# Patient Record
Sex: Male | Born: 1980
Health system: Southern US, Community
[De-identification: ages and names within clinical notes are randomized; demographics above are authoritative.]

## PROBLEM LIST (undated history)

## (undated) DIAGNOSIS — M1990 Unspecified osteoarthritis, unspecified site: Secondary | ICD-10-CM

## (undated) DIAGNOSIS — F32A Depression, unspecified: Secondary | ICD-10-CM

## (undated) DIAGNOSIS — J45909 Unspecified asthma, uncomplicated: Secondary | ICD-10-CM

## (undated) DIAGNOSIS — F329 Major depressive disorder, single episode, unspecified: Secondary | ICD-10-CM

## (undated) DIAGNOSIS — T8149XA Infection following a procedure, other surgical site, initial encounter: Secondary | ICD-10-CM

## (undated) DIAGNOSIS — F419 Anxiety disorder, unspecified: Secondary | ICD-10-CM

## (undated) DIAGNOSIS — I00 Rheumatic fever without heart involvement: Secondary | ICD-10-CM

## (undated) DIAGNOSIS — G894 Chronic pain syndrome: Secondary | ICD-10-CM

## (undated) HISTORY — DX: Unspecified asthma, uncomplicated: J45.909

## (undated) HISTORY — DX: Unspecified osteoarthritis, unspecified site: M19.90

## (undated) HISTORY — DX: Rheumatic fever without heart involvement: I00

## (undated) HISTORY — DX: Depression, unspecified: F32.A

## (undated) HISTORY — DX: Chronic pain syndrome: G89.4

## (undated) HISTORY — DX: Major depressive disorder, single episode, unspecified: F32.9

## (undated) HISTORY — DX: Infection following a procedure, other surgical site, initial encounter: T81.49XA

## (undated) HISTORY — DX: Anxiety disorder, unspecified: F41.9

---
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
## 2003-09-11 ENCOUNTER — Other Ambulatory Visit: Payer: Self-pay

## 2010-09-17 DIAGNOSIS — T8149XA Infection following a procedure, other surgical site, initial encounter: Secondary | ICD-10-CM

## 2010-09-17 HISTORY — DX: Infection following a procedure, other surgical site, initial encounter: T81.49XA

## 2011-09-29 ENCOUNTER — Emergency Department: Payer: Self-pay | Admitting: Emergency Medicine

## 2011-09-29 DIAGNOSIS — R6889 Other general symptoms and signs: Secondary | ICD-10-CM | POA: Diagnosis not present

## 2011-09-29 LAB — BASIC METABOLIC PANEL
BUN: 21 mg/dL — ABNORMAL HIGH (ref 7–18)
Calcium, Total: 8.9 mg/dL (ref 8.5–10.1)
Creatinine: 0.7 mg/dL (ref 0.60–1.30)
EGFR (African American): >60
EGFR (Non-African Amer.): >60
Glucose: 113 mg/dL — ABNORMAL HIGH (ref 65–99)
Potassium: 4 mmol/L (ref 3.5–5.1)
Sodium: 143 mmol/L (ref 136–145)

## 2011-09-29 LAB — CBC
MCH: 29.8 pg (ref 26.0–34.0)
MCV: 85 fL (ref 80–100)
Platelet: 186 10*3/uL (ref 150–440)
RBC: 5.09 10*6/uL (ref 4.40–5.90)
RDW: 13.8 % (ref 11.5–14.5)
WBC: 7.4 10*3/uL (ref 3.8–10.6)

## 2011-11-21 DIAGNOSIS — N390 Urinary tract infection, site not specified: Secondary | ICD-10-CM | POA: Diagnosis not present

## 2011-11-21 DIAGNOSIS — M25559 Pain in unspecified hip: Secondary | ICD-10-CM | POA: Diagnosis not present

## 2011-12-12 DIAGNOSIS — N390 Urinary tract infection, site not specified: Secondary | ICD-10-CM | POA: Diagnosis not present

## 2011-12-19 DIAGNOSIS — G8220 Paraplegia, unspecified: Secondary | ICD-10-CM | POA: Diagnosis not present

## 2011-12-19 DIAGNOSIS — R293 Abnormal posture: Secondary | ICD-10-CM | POA: Diagnosis not present

## 2011-12-19 DIAGNOSIS — Z4689 Encounter for fitting and adjustment of other specified devices: Secondary | ICD-10-CM | POA: Diagnosis not present

## 2011-12-30 DIAGNOSIS — Z20828 Contact with and (suspected) exposure to other viral communicable diseases: Secondary | ICD-10-CM | POA: Diagnosis not present

## 2012-01-02 DIAGNOSIS — G894 Chronic pain syndrome: Secondary | ICD-10-CM | POA: Diagnosis not present

## 2012-01-02 DIAGNOSIS — R369 Urethral discharge, unspecified: Secondary | ICD-10-CM | POA: Diagnosis not present

## 2012-01-02 DIAGNOSIS — R5383 Other fatigue: Secondary | ICD-10-CM | POA: Diagnosis not present

## 2012-01-02 DIAGNOSIS — G8220 Paraplegia, unspecified: Secondary | ICD-10-CM | POA: Diagnosis not present

## 2012-01-02 DIAGNOSIS — F4310 Post-traumatic stress disorder, unspecified: Secondary | ICD-10-CM | POA: Diagnosis not present

## 2012-01-02 DIAGNOSIS — F319 Bipolar disorder, unspecified: Secondary | ICD-10-CM | POA: Diagnosis not present

## 2012-01-02 DIAGNOSIS — R5381 Other malaise: Secondary | ICD-10-CM | POA: Diagnosis not present

## 2012-01-02 DIAGNOSIS — F988 Other specified behavioral and emotional disorders with onset usually occurring in childhood and adolescence: Secondary | ICD-10-CM | POA: Diagnosis not present

## 2012-01-30 DIAGNOSIS — N390 Urinary tract infection, site not specified: Secondary | ICD-10-CM | POA: Diagnosis not present

## 2012-02-13 DIAGNOSIS — R21 Rash and other nonspecific skin eruption: Secondary | ICD-10-CM | POA: Diagnosis not present

## 2012-02-13 DIAGNOSIS — N390 Urinary tract infection, site not specified: Secondary | ICD-10-CM | POA: Diagnosis not present

## 2012-02-13 DIAGNOSIS — N529 Male erectile dysfunction, unspecified: Secondary | ICD-10-CM | POA: Diagnosis not present

## 2012-02-13 DIAGNOSIS — G8220 Paraplegia, unspecified: Secondary | ICD-10-CM | POA: Diagnosis not present

## 2012-02-14 DIAGNOSIS — R21 Rash and other nonspecific skin eruption: Secondary | ICD-10-CM | POA: Diagnosis not present

## 2012-05-01 DIAGNOSIS — N390 Urinary tract infection, site not specified: Secondary | ICD-10-CM | POA: Diagnosis not present

## 2012-05-08 DIAGNOSIS — R339 Retention of urine, unspecified: Secondary | ICD-10-CM | POA: Diagnosis not present

## 2012-05-22 DIAGNOSIS — N390 Urinary tract infection, site not specified: Secondary | ICD-10-CM | POA: Diagnosis not present

## 2012-05-22 DIAGNOSIS — G8220 Paraplegia, unspecified: Secondary | ICD-10-CM | POA: Diagnosis not present

## 2012-05-22 DIAGNOSIS — G894 Chronic pain syndrome: Secondary | ICD-10-CM | POA: Diagnosis not present

## 2012-06-10 DIAGNOSIS — N390 Urinary tract infection, site not specified: Secondary | ICD-10-CM | POA: Diagnosis not present

## 2012-07-16 DIAGNOSIS — G4700 Insomnia, unspecified: Secondary | ICD-10-CM | POA: Diagnosis not present

## 2012-07-16 DIAGNOSIS — R6882 Decreased libido: Secondary | ICD-10-CM | POA: Diagnosis not present

## 2012-07-16 DIAGNOSIS — R5383 Other fatigue: Secondary | ICD-10-CM | POA: Diagnosis not present

## 2012-07-16 DIAGNOSIS — R5381 Other malaise: Secondary | ICD-10-CM | POA: Diagnosis not present

## 2012-07-17 DIAGNOSIS — Z79899 Other long term (current) drug therapy: Secondary | ICD-10-CM | POA: Diagnosis not present

## 2012-07-17 DIAGNOSIS — N390 Urinary tract infection, site not specified: Secondary | ICD-10-CM | POA: Diagnosis not present

## 2012-07-18 DIAGNOSIS — R5381 Other malaise: Secondary | ICD-10-CM | POA: Diagnosis not present

## 2012-07-18 DIAGNOSIS — R5383 Other fatigue: Secondary | ICD-10-CM | POA: Diagnosis not present

## 2012-08-28 DIAGNOSIS — G894 Chronic pain syndrome: Secondary | ICD-10-CM | POA: Diagnosis not present

## 2012-08-28 DIAGNOSIS — N390 Urinary tract infection, site not specified: Secondary | ICD-10-CM | POA: Diagnosis not present

## 2012-08-28 DIAGNOSIS — G8220 Paraplegia, unspecified: Secondary | ICD-10-CM | POA: Diagnosis not present

## 2012-08-28 DIAGNOSIS — G4700 Insomnia, unspecified: Secondary | ICD-10-CM | POA: Diagnosis not present

## 2012-09-24 DIAGNOSIS — N390 Urinary tract infection, site not specified: Secondary | ICD-10-CM | POA: Diagnosis not present

## 2012-12-04 DIAGNOSIS — F4310 Post-traumatic stress disorder, unspecified: Secondary | ICD-10-CM | POA: Diagnosis not present

## 2012-12-04 DIAGNOSIS — G894 Chronic pain syndrome: Secondary | ICD-10-CM | POA: Diagnosis not present

## 2012-12-04 DIAGNOSIS — Z79899 Other long term (current) drug therapy: Secondary | ICD-10-CM | POA: Diagnosis not present

## 2012-12-04 DIAGNOSIS — G8220 Paraplegia, unspecified: Secondary | ICD-10-CM | POA: Diagnosis not present

## 2012-12-04 DIAGNOSIS — N390 Urinary tract infection, site not specified: Secondary | ICD-10-CM | POA: Diagnosis not present

## 2012-12-24 DIAGNOSIS — N390 Urinary tract infection, site not specified: Secondary | ICD-10-CM | POA: Diagnosis not present

## 2013-01-20 DIAGNOSIS — G8220 Paraplegia, unspecified: Secondary | ICD-10-CM | POA: Diagnosis not present

## 2013-01-20 DIAGNOSIS — N452 Orchitis: Secondary | ICD-10-CM | POA: Diagnosis not present

## 2013-01-20 DIAGNOSIS — G894 Chronic pain syndrome: Secondary | ICD-10-CM | POA: Diagnosis not present

## 2013-01-20 DIAGNOSIS — N390 Urinary tract infection, site not specified: Secondary | ICD-10-CM | POA: Diagnosis not present

## 2013-01-20 DIAGNOSIS — F4310 Post-traumatic stress disorder, unspecified: Secondary | ICD-10-CM | POA: Diagnosis not present

## 2013-04-22 DIAGNOSIS — G894 Chronic pain syndrome: Secondary | ICD-10-CM | POA: Diagnosis not present

## 2013-04-22 DIAGNOSIS — R3919 Other difficulties with micturition: Secondary | ICD-10-CM | POA: Diagnosis not present

## 2013-04-22 DIAGNOSIS — Z13220 Encounter for screening for lipoid disorders: Secondary | ICD-10-CM | POA: Diagnosis not present

## 2013-04-22 DIAGNOSIS — N452 Orchitis: Secondary | ICD-10-CM | POA: Diagnosis not present

## 2013-04-22 DIAGNOSIS — N390 Urinary tract infection, site not specified: Secondary | ICD-10-CM | POA: Diagnosis not present

## 2013-04-22 DIAGNOSIS — G8220 Paraplegia, unspecified: Secondary | ICD-10-CM | POA: Diagnosis not present

## 2013-04-22 DIAGNOSIS — F988 Other specified behavioral and emotional disorders with onset usually occurring in childhood and adolescence: Secondary | ICD-10-CM | POA: Diagnosis not present

## 2013-04-22 DIAGNOSIS — F4310 Post-traumatic stress disorder, unspecified: Secondary | ICD-10-CM | POA: Diagnosis not present

## 2013-04-22 DIAGNOSIS — Z993 Dependence on wheelchair: Secondary | ICD-10-CM | POA: Diagnosis not present

## 2013-04-22 DIAGNOSIS — Z79899 Other long term (current) drug therapy: Secondary | ICD-10-CM | POA: Diagnosis not present

## 2013-05-21 DIAGNOSIS — N319 Neuromuscular dysfunction of bladder, unspecified: Secondary | ICD-10-CM | POA: Diagnosis not present

## 2013-05-21 DIAGNOSIS — N390 Urinary tract infection, site not specified: Secondary | ICD-10-CM | POA: Diagnosis not present

## 2013-06-11 DIAGNOSIS — N390 Urinary tract infection, site not specified: Secondary | ICD-10-CM | POA: Diagnosis not present

## 2013-06-11 DIAGNOSIS — J069 Acute upper respiratory infection, unspecified: Secondary | ICD-10-CM | POA: Diagnosis not present

## 2013-06-11 DIAGNOSIS — N318 Other neuromuscular dysfunction of bladder: Secondary | ICD-10-CM | POA: Diagnosis not present

## 2013-06-17 DIAGNOSIS — Z23 Encounter for immunization: Secondary | ICD-10-CM | POA: Diagnosis not present

## 2013-06-17 DIAGNOSIS — L89309 Pressure ulcer of unspecified buttock, unspecified stage: Secondary | ICD-10-CM | POA: Diagnosis not present

## 2013-06-17 DIAGNOSIS — R110 Nausea: Secondary | ICD-10-CM | POA: Diagnosis not present

## 2013-06-17 DIAGNOSIS — Z993 Dependence on wheelchair: Secondary | ICD-10-CM | POA: Diagnosis not present

## 2013-06-17 DIAGNOSIS — G894 Chronic pain syndrome: Secondary | ICD-10-CM | POA: Diagnosis not present

## 2013-06-17 DIAGNOSIS — R0602 Shortness of breath: Secondary | ICD-10-CM | POA: Diagnosis not present

## 2013-06-17 DIAGNOSIS — F988 Other specified behavioral and emotional disorders with onset usually occurring in childhood and adolescence: Secondary | ICD-10-CM | POA: Diagnosis not present

## 2013-06-17 DIAGNOSIS — G8220 Paraplegia, unspecified: Secondary | ICD-10-CM | POA: Diagnosis not present

## 2013-06-17 DIAGNOSIS — F4310 Post-traumatic stress disorder, unspecified: Secondary | ICD-10-CM | POA: Diagnosis not present

## 2013-06-23 DIAGNOSIS — Z79899 Other long term (current) drug therapy: Secondary | ICD-10-CM | POA: Diagnosis not present

## 2013-06-23 DIAGNOSIS — N508 Other specified disorders of male genital organs: Secondary | ICD-10-CM | POA: Diagnosis not present

## 2013-06-23 DIAGNOSIS — R609 Edema, unspecified: Secondary | ICD-10-CM | POA: Diagnosis not present

## 2013-06-23 DIAGNOSIS — N509 Disorder of male genital organs, unspecified: Secondary | ICD-10-CM | POA: Diagnosis not present

## 2013-06-23 DIAGNOSIS — G8220 Paraplegia, unspecified: Secondary | ICD-10-CM | POA: Diagnosis not present

## 2013-06-23 DIAGNOSIS — N318 Other neuromuscular dysfunction of bladder: Secondary | ICD-10-CM | POA: Diagnosis not present

## 2013-11-09 DIAGNOSIS — L97509 Non-pressure chronic ulcer of other part of unspecified foot with unspecified severity: Secondary | ICD-10-CM | POA: Diagnosis not present

## 2013-11-09 DIAGNOSIS — F4310 Post-traumatic stress disorder, unspecified: Secondary | ICD-10-CM | POA: Diagnosis not present

## 2013-11-09 DIAGNOSIS — R300 Dysuria: Secondary | ICD-10-CM | POA: Diagnosis not present

## 2013-11-09 DIAGNOSIS — L89309 Pressure ulcer of unspecified buttock, unspecified stage: Secondary | ICD-10-CM | POA: Diagnosis not present

## 2013-11-09 DIAGNOSIS — G894 Chronic pain syndrome: Secondary | ICD-10-CM | POA: Diagnosis not present

## 2013-11-09 DIAGNOSIS — F319 Bipolar disorder, unspecified: Secondary | ICD-10-CM | POA: Diagnosis not present

## 2013-11-09 DIAGNOSIS — F411 Generalized anxiety disorder: Secondary | ICD-10-CM | POA: Diagnosis not present

## 2013-11-09 DIAGNOSIS — G4700 Insomnia, unspecified: Secondary | ICD-10-CM | POA: Diagnosis not present

## 2013-11-29 DIAGNOSIS — N390 Urinary tract infection, site not specified: Secondary | ICD-10-CM | POA: Diagnosis not present

## 2013-11-29 DIAGNOSIS — Z888 Allergy status to other drugs, medicaments and biological substances status: Secondary | ICD-10-CM | POA: Diagnosis not present

## 2013-11-29 DIAGNOSIS — Z885 Allergy status to narcotic agent status: Secondary | ICD-10-CM | POA: Diagnosis not present

## 2013-11-29 DIAGNOSIS — T191XXA Foreign body in bladder, initial encounter: Secondary | ICD-10-CM | POA: Diagnosis not present

## 2013-11-29 DIAGNOSIS — Z79899 Other long term (current) drug therapy: Secondary | ICD-10-CM | POA: Diagnosis not present

## 2013-11-29 DIAGNOSIS — N329 Bladder disorder, unspecified: Secondary | ICD-10-CM | POA: Diagnosis not present

## 2013-11-29 DIAGNOSIS — Z87891 Personal history of nicotine dependence: Secondary | ICD-10-CM | POA: Diagnosis not present

## 2013-11-29 DIAGNOSIS — Z883 Allergy status to other anti-infective agents status: Secondary | ICD-10-CM | POA: Diagnosis not present

## 2013-11-29 DIAGNOSIS — T190XXA Foreign body in urethra, initial encounter: Secondary | ICD-10-CM | POA: Diagnosis not present

## 2013-11-29 DIAGNOSIS — G839 Paralytic syndrome, unspecified: Secondary | ICD-10-CM | POA: Diagnosis not present

## 2013-11-29 DIAGNOSIS — Z791 Long term (current) use of non-steroidal anti-inflammatories (NSAID): Secondary | ICD-10-CM | POA: Diagnosis not present

## 2013-12-01 DIAGNOSIS — T191XXA Foreign body in bladder, initial encounter: Secondary | ICD-10-CM | POA: Diagnosis not present

## 2013-12-01 DIAGNOSIS — Z0189 Encounter for other specified special examinations: Secondary | ICD-10-CM | POA: Diagnosis not present

## 2013-12-01 DIAGNOSIS — T190XXA Foreign body in urethra, initial encounter: Secondary | ICD-10-CM | POA: Diagnosis not present

## 2014-02-23 DIAGNOSIS — N390 Urinary tract infection, site not specified: Secondary | ICD-10-CM | POA: Diagnosis not present

## 2014-02-23 DIAGNOSIS — N529 Male erectile dysfunction, unspecified: Secondary | ICD-10-CM | POA: Diagnosis not present

## 2014-02-23 DIAGNOSIS — G894 Chronic pain syndrome: Secondary | ICD-10-CM | POA: Diagnosis not present

## 2014-02-23 DIAGNOSIS — G8220 Paraplegia, unspecified: Secondary | ICD-10-CM | POA: Diagnosis not present

## 2014-02-23 DIAGNOSIS — L708 Other acne: Secondary | ICD-10-CM | POA: Diagnosis not present

## 2014-02-23 DIAGNOSIS — Z993 Dependence on wheelchair: Secondary | ICD-10-CM | POA: Diagnosis not present

## 2014-02-23 DIAGNOSIS — F988 Other specified behavioral and emotional disorders with onset usually occurring in childhood and adolescence: Secondary | ICD-10-CM | POA: Diagnosis not present

## 2014-02-24 DIAGNOSIS — N390 Urinary tract infection, site not specified: Secondary | ICD-10-CM | POA: Diagnosis not present

## 2014-02-24 DIAGNOSIS — Z79899 Other long term (current) drug therapy: Secondary | ICD-10-CM | POA: Diagnosis not present

## 2014-02-24 DIAGNOSIS — M25559 Pain in unspecified hip: Secondary | ICD-10-CM | POA: Diagnosis not present

## 2014-02-24 DIAGNOSIS — J069 Acute upper respiratory infection, unspecified: Secondary | ICD-10-CM | POA: Diagnosis not present

## 2014-02-24 DIAGNOSIS — G8220 Paraplegia, unspecified: Secondary | ICD-10-CM | POA: Diagnosis not present

## 2014-03-16 DIAGNOSIS — R300 Dysuria: Secondary | ICD-10-CM | POA: Diagnosis not present

## 2014-03-16 DIAGNOSIS — L89309 Pressure ulcer of unspecified buttock, unspecified stage: Secondary | ICD-10-CM | POA: Diagnosis not present

## 2014-03-16 DIAGNOSIS — N390 Urinary tract infection, site not specified: Secondary | ICD-10-CM | POA: Diagnosis not present

## 2014-03-16 DIAGNOSIS — L218 Other seborrheic dermatitis: Secondary | ICD-10-CM | POA: Diagnosis not present

## 2014-03-25 ENCOUNTER — Encounter: Payer: Self-pay | Admitting: Surgery

## 2014-03-25 DIAGNOSIS — L89209 Pressure ulcer of unspecified hip, unspecified stage: Secondary | ICD-10-CM | POA: Diagnosis not present

## 2014-03-25 DIAGNOSIS — G8220 Paraplegia, unspecified: Secondary | ICD-10-CM | POA: Diagnosis not present

## 2014-05-19 DIAGNOSIS — N390 Urinary tract infection, site not specified: Secondary | ICD-10-CM | POA: Diagnosis not present

## 2014-05-19 DIAGNOSIS — G894 Chronic pain syndrome: Secondary | ICD-10-CM | POA: Diagnosis not present

## 2014-06-23 DIAGNOSIS — N390 Urinary tract infection, site not specified: Secondary | ICD-10-CM | POA: Diagnosis not present

## 2014-06-25 ENCOUNTER — Ambulatory Visit: Payer: Self-pay | Admitting: Family Medicine

## 2014-06-25 DIAGNOSIS — G8220 Paraplegia, unspecified: Secondary | ICD-10-CM | POA: Diagnosis not present

## 2014-06-25 DIAGNOSIS — R300 Dysuria: Secondary | ICD-10-CM | POA: Diagnosis not present

## 2014-06-25 DIAGNOSIS — Z5189 Encounter for other specified aftercare: Secondary | ICD-10-CM | POA: Diagnosis not present

## 2014-06-25 DIAGNOSIS — R42 Dizziness and giddiness: Secondary | ICD-10-CM | POA: Diagnosis not present

## 2014-06-25 DIAGNOSIS — Z23 Encounter for immunization: Secondary | ICD-10-CM | POA: Diagnosis not present

## 2014-06-25 DIAGNOSIS — K047 Periapical abscess without sinus: Secondary | ICD-10-CM | POA: Diagnosis not present

## 2014-06-25 LAB — COMPREHENSIVE METABOLIC PANEL
ALBUMIN: 4 g/dL (ref 3.4–5.0)
ALT: 18 U/L
AST: 15 U/L (ref 15–37)
Alkaline Phosphatase: 68 U/L
Anion Gap: 8 (ref 7–16)
BILIRUBIN TOTAL: 0.3 mg/dL (ref 0.2–1.0)
BUN: 22 mg/dL — AB (ref 7–18)
CHLORIDE: 103 mmol/L (ref 98–107)
CREATININE: 0.78 mg/dL (ref 0.60–1.30)
Calcium, Total: 9 mg/dL (ref 8.5–10.1)
Co2: 30 mmol/L (ref 21–32)
EGFR (African American): >60
EGFR (Non-African Amer.): >60
GLUCOSE: 87 mg/dL (ref 65–99)
Osmolality: 284 (ref 275–301)
Potassium: 3.7 mmol/L (ref 3.5–5.1)
SODIUM: 141 mmol/L (ref 136–145)
Total Protein: 7.4 g/dL (ref 6.4–8.2)

## 2014-06-25 LAB — CBC WITH DIFFERENTIAL/PLATELET
Basophil #: 0 10*3/uL (ref 0.0–0.1)
Basophil %: 0.5 %
EOS ABS: 0.1 10*3/uL (ref 0.0–0.7)
EOS PCT: 2.3 %
HCT: 44.1 % (ref 40.0–52.0)
HGB: 14.4 g/dL (ref 13.0–18.0)
Lymphocyte #: 1.6 10*3/uL (ref 1.0–3.6)
Lymphocyte %: 32.7 %
MCH: 27.8 pg (ref 26.0–34.0)
MCHC: 32.6 g/dL (ref 32.0–36.0)
MCV: 85 fL (ref 80–100)
MONO ABS: 0.3 x10 3/mm (ref 0.2–1.0)
Monocyte %: 6 %
NEUTROS PCT: 58.5 %
Neutrophil #: 2.8 10*3/uL (ref 1.4–6.5)
PLATELETS: 158 10*3/uL (ref 150–440)
RBC: 5.17 10*6/uL (ref 4.40–5.90)
RDW: 13.8 % (ref 11.5–14.5)
WBC: 4.8 10*3/uL (ref 3.8–10.6)

## 2014-06-28 ENCOUNTER — Emergency Department: Payer: Self-pay | Admitting: Emergency Medicine

## 2014-06-28 DIAGNOSIS — R319 Hematuria, unspecified: Secondary | ICD-10-CM | POA: Diagnosis not present

## 2014-06-28 DIAGNOSIS — G8929 Other chronic pain: Secondary | ICD-10-CM | POA: Diagnosis not present

## 2014-06-28 DIAGNOSIS — Z79899 Other long term (current) drug therapy: Secondary | ICD-10-CM | POA: Diagnosis not present

## 2014-06-28 DIAGNOSIS — M62831 Muscle spasm of calf: Secondary | ICD-10-CM | POA: Diagnosis not present

## 2014-06-28 DIAGNOSIS — M62838 Other muscle spasm: Secondary | ICD-10-CM | POA: Diagnosis not present

## 2014-06-28 DIAGNOSIS — G8220 Paraplegia, unspecified: Secondary | ICD-10-CM | POA: Diagnosis not present

## 2014-06-28 DIAGNOSIS — L03116 Cellulitis of left lower limb: Secondary | ICD-10-CM | POA: Diagnosis not present

## 2014-06-28 LAB — URINALYSIS, COMPLETE
Bilirubin,UR: NEGATIVE
Glucose,UR: NEGATIVE mg/dL (ref 0–75)
Ketone: NEGATIVE
Nitrite: POSITIVE
Ph: 6 (ref 4.5–8.0)
Protein: 30
RBC,UR: 670 /HPF (ref 0–5)
SPECIFIC GRAVITY: 1.027 (ref 1.003–1.030)
Squamous Epithelial: 1
WBC UR: 45 /HPF (ref 0–5)

## 2014-06-28 LAB — BASIC METABOLIC PANEL
Anion Gap: 5 — ABNORMAL LOW (ref 7–16)
BUN: 17 mg/dL (ref 7–18)
CALCIUM: 8.3 mg/dL — AB (ref 8.5–10.1)
CHLORIDE: 102 mmol/L (ref 98–107)
CREATININE: 0.82 mg/dL (ref 0.60–1.30)
Co2: 32 mmol/L (ref 21–32)
EGFR (African American): >60
EGFR (Non-African Amer.): >60
Glucose: 78 mg/dL (ref 65–99)
OSMOLALITY: 278 (ref 275–301)
Potassium: 3.6 mmol/L (ref 3.5–5.1)
SODIUM: 139 mmol/L (ref 136–145)

## 2014-06-28 LAB — CBC
HCT: 40.1 % (ref 40.0–52.0)
HGB: 12.8 g/dL — AB (ref 13.0–18.0)
MCH: 27.5 pg (ref 26.0–34.0)
MCHC: 32 g/dL (ref 32.0–36.0)
MCV: 86 fL (ref 80–100)
Platelet: 116 10*3/uL — ABNORMAL LOW (ref 150–440)
RBC: 4.68 10*6/uL (ref 4.40–5.90)
RDW: 13.9 % (ref 11.5–14.5)
WBC: 9.3 10*3/uL (ref 3.8–10.6)

## 2014-06-30 LAB — URINE CULTURE

## 2014-07-06 DIAGNOSIS — G8220 Paraplegia, unspecified: Secondary | ICD-10-CM | POA: Diagnosis not present

## 2014-07-06 DIAGNOSIS — K047 Periapical abscess without sinus: Secondary | ICD-10-CM | POA: Diagnosis not present

## 2014-07-06 DIAGNOSIS — R300 Dysuria: Secondary | ICD-10-CM | POA: Diagnosis not present

## 2014-07-06 DIAGNOSIS — Z23 Encounter for immunization: Secondary | ICD-10-CM | POA: Diagnosis not present

## 2014-07-06 DIAGNOSIS — L03119 Cellulitis of unspecified part of limb: Secondary | ICD-10-CM | POA: Diagnosis not present

## 2014-07-06 DIAGNOSIS — N390 Urinary tract infection, site not specified: Secondary | ICD-10-CM | POA: Diagnosis not present

## 2014-07-06 DIAGNOSIS — Z5189 Encounter for other specified aftercare: Secondary | ICD-10-CM | POA: Diagnosis not present

## 2014-07-06 DIAGNOSIS — R110 Nausea: Secondary | ICD-10-CM | POA: Diagnosis not present

## 2014-07-06 DIAGNOSIS — N319 Neuromuscular dysfunction of bladder, unspecified: Secondary | ICD-10-CM | POA: Diagnosis not present

## 2014-07-14 ENCOUNTER — Ambulatory Visit: Payer: Self-pay

## 2014-07-14 DIAGNOSIS — R197 Diarrhea, unspecified: Secondary | ICD-10-CM | POA: Diagnosis not present

## 2014-07-14 DIAGNOSIS — K5900 Constipation, unspecified: Secondary | ICD-10-CM | POA: Diagnosis not present

## 2014-07-14 DIAGNOSIS — N390 Urinary tract infection, site not specified: Secondary | ICD-10-CM | POA: Diagnosis not present

## 2014-07-14 DIAGNOSIS — K4090 Unilateral inguinal hernia, without obstruction or gangrene, not specified as recurrent: Secondary | ICD-10-CM | POA: Diagnosis not present

## 2014-07-23 DIAGNOSIS — N390 Urinary tract infection, site not specified: Secondary | ICD-10-CM | POA: Diagnosis not present

## 2014-07-27 DIAGNOSIS — N390 Urinary tract infection, site not specified: Secondary | ICD-10-CM | POA: Diagnosis not present

## 2014-08-16 DIAGNOSIS — R300 Dysuria: Secondary | ICD-10-CM | POA: Diagnosis not present

## 2014-09-03 DIAGNOSIS — M25571 Pain in right ankle and joints of right foot: Secondary | ICD-10-CM | POA: Diagnosis not present

## 2014-09-03 DIAGNOSIS — M25551 Pain in right hip: Secondary | ICD-10-CM | POA: Diagnosis not present

## 2014-09-03 DIAGNOSIS — M1611 Unilateral primary osteoarthritis, right hip: Secondary | ICD-10-CM | POA: Diagnosis not present

## 2014-09-03 DIAGNOSIS — M25471 Effusion, right ankle: Secondary | ICD-10-CM | POA: Diagnosis not present

## 2014-09-13 DIAGNOSIS — N390 Urinary tract infection, site not specified: Secondary | ICD-10-CM | POA: Diagnosis not present

## 2014-09-13 DIAGNOSIS — L03119 Cellulitis of unspecified part of limb: Secondary | ICD-10-CM | POA: Diagnosis not present

## 2014-09-13 DIAGNOSIS — L309 Dermatitis, unspecified: Secondary | ICD-10-CM | POA: Diagnosis not present

## 2014-09-13 DIAGNOSIS — R300 Dysuria: Secondary | ICD-10-CM | POA: Diagnosis not present

## 2014-09-13 DIAGNOSIS — R110 Nausea: Secondary | ICD-10-CM | POA: Diagnosis not present

## 2014-09-13 DIAGNOSIS — Z23 Encounter for immunization: Secondary | ICD-10-CM | POA: Diagnosis not present

## 2014-09-13 DIAGNOSIS — G8220 Paraplegia, unspecified: Secondary | ICD-10-CM | POA: Diagnosis not present

## 2014-09-13 DIAGNOSIS — R252 Cramp and spasm: Secondary | ICD-10-CM | POA: Diagnosis not present

## 2014-09-30 DIAGNOSIS — N390 Urinary tract infection, site not specified: Secondary | ICD-10-CM | POA: Diagnosis not present

## 2014-09-30 DIAGNOSIS — R8290 Unspecified abnormal findings in urine: Secondary | ICD-10-CM | POA: Diagnosis not present

## 2014-10-19 DIAGNOSIS — N390 Urinary tract infection, site not specified: Secondary | ICD-10-CM | POA: Diagnosis not present

## 2014-10-25 DIAGNOSIS — N390 Urinary tract infection, site not specified: Secondary | ICD-10-CM | POA: Diagnosis not present

## 2014-11-19 DIAGNOSIS — N390 Urinary tract infection, site not specified: Secondary | ICD-10-CM | POA: Diagnosis not present

## 2014-11-30 DIAGNOSIS — Z79899 Other long term (current) drug therapy: Secondary | ICD-10-CM | POA: Diagnosis not present

## 2014-11-30 DIAGNOSIS — K5900 Constipation, unspecified: Secondary | ICD-10-CM | POA: Diagnosis not present

## 2014-12-29 DIAGNOSIS — N390 Urinary tract infection, site not specified: Secondary | ICD-10-CM | POA: Diagnosis not present

## 2015-01-04 DIAGNOSIS — G8220 Paraplegia, unspecified: Secondary | ICD-10-CM | POA: Diagnosis not present

## 2015-01-04 DIAGNOSIS — L03119 Cellulitis of unspecified part of limb: Secondary | ICD-10-CM | POA: Diagnosis not present

## 2015-01-04 DIAGNOSIS — Z23 Encounter for immunization: Secondary | ICD-10-CM | POA: Diagnosis not present

## 2015-01-04 DIAGNOSIS — R300 Dysuria: Secondary | ICD-10-CM | POA: Diagnosis not present

## 2015-01-04 DIAGNOSIS — R110 Nausea: Secondary | ICD-10-CM | POA: Diagnosis not present

## 2015-01-04 DIAGNOSIS — R201 Hypoesthesia of skin: Secondary | ICD-10-CM | POA: Diagnosis not present

## 2015-01-04 DIAGNOSIS — L309 Dermatitis, unspecified: Secondary | ICD-10-CM | POA: Diagnosis not present

## 2015-01-04 DIAGNOSIS — N390 Urinary tract infection, site not specified: Secondary | ICD-10-CM | POA: Diagnosis not present

## 2015-01-12 ENCOUNTER — Encounter
Admit: 2015-01-12 | Disposition: A | Discharge status: Home / Self Care | Payer: Self-pay | Attending: Surgery | Admitting: Surgery

## 2015-01-12 DIAGNOSIS — L89311 Pressure ulcer of right buttock, stage 1: Secondary | ICD-10-CM | POA: Diagnosis not present

## 2015-01-12 DIAGNOSIS — G8220 Paraplegia, unspecified: Secondary | ICD-10-CM | POA: Diagnosis not present

## 2015-01-17 DIAGNOSIS — N390 Urinary tract infection, site not specified: Secondary | ICD-10-CM | POA: Diagnosis not present

## 2015-02-02 DIAGNOSIS — N319 Neuromuscular dysfunction of bladder, unspecified: Secondary | ICD-10-CM | POA: Diagnosis not present

## 2015-02-02 DIAGNOSIS — G894 Chronic pain syndrome: Secondary | ICD-10-CM | POA: Diagnosis not present

## 2015-02-02 DIAGNOSIS — G8220 Paraplegia, unspecified: Secondary | ICD-10-CM | POA: Diagnosis not present

## 2015-02-02 DIAGNOSIS — N390 Urinary tract infection, site not specified: Secondary | ICD-10-CM | POA: Diagnosis not present

## 2015-02-15 DIAGNOSIS — N390 Urinary tract infection, site not specified: Secondary | ICD-10-CM | POA: Diagnosis not present

## 2015-02-17 ENCOUNTER — Telehealth: Payer: Self-pay

## 2015-02-17 DIAGNOSIS — L8990 Pressure ulcer of unspecified site, unspecified stage: Secondary | ICD-10-CM | POA: Insufficient documentation

## 2015-02-17 DIAGNOSIS — R201 Hypoesthesia of skin: Secondary | ICD-10-CM | POA: Insufficient documentation

## 2015-02-17 DIAGNOSIS — K5909 Other constipation: Secondary | ICD-10-CM | POA: Insufficient documentation

## 2015-02-17 DIAGNOSIS — B354 Tinea corporis: Secondary | ICD-10-CM | POA: Insufficient documentation

## 2015-02-17 DIAGNOSIS — M62838 Other muscle spasm: Secondary | ICD-10-CM | POA: Insufficient documentation

## 2015-02-17 DIAGNOSIS — R252 Cramp and spasm: Secondary | ICD-10-CM | POA: Insufficient documentation

## 2015-02-17 DIAGNOSIS — K219 Gastro-esophageal reflux disease without esophagitis: Secondary | ICD-10-CM | POA: Insufficient documentation

## 2015-02-17 DIAGNOSIS — F419 Anxiety disorder, unspecified: Secondary | ICD-10-CM | POA: Insufficient documentation

## 2015-02-17 DIAGNOSIS — K047 Periapical abscess without sinus: Secondary | ICD-10-CM | POA: Insufficient documentation

## 2015-02-17 DIAGNOSIS — R42 Dizziness and giddiness: Secondary | ICD-10-CM | POA: Insufficient documentation

## 2015-02-17 DIAGNOSIS — L89309 Pressure ulcer of unspecified buttock, unspecified stage: Secondary | ICD-10-CM | POA: Insufficient documentation

## 2015-02-17 DIAGNOSIS — F988 Other specified behavioral and emotional disorders with onset usually occurring in childhood and adolescence: Secondary | ICD-10-CM | POA: Insufficient documentation

## 2015-02-17 DIAGNOSIS — IMO0002 Reserved for concepts with insufficient information to code with codable children: Secondary | ICD-10-CM | POA: Insufficient documentation

## 2015-02-17 DIAGNOSIS — R110 Nausea: Secondary | ICD-10-CM | POA: Insufficient documentation

## 2015-02-17 DIAGNOSIS — L309 Dermatitis, unspecified: Secondary | ICD-10-CM | POA: Insufficient documentation

## 2015-02-17 NOTE — Telephone Encounter (Signed)
Patient called and states that he is having his usual urinary symptoms and severe spasms in that area. Urine culture and dip was done on may 31st and it was normal, patient states that day his symptoms were very mild and thinks that is why results are normal. Patient wants to know if he can give another sample to have this re checked today please. States he is hurting bad. Please advised. Thank you.

## 2015-02-17 NOTE — Telephone Encounter (Signed)
Pt stated he would like to speak with [facility] again because he forgot what was discussed earlier. Thanks TNP

## 2015-02-17 NOTE — Telephone Encounter (Signed)
OK to bring in sample for u/a and urine culture. May take cipro 500mg  twice daily for 7 days while awaiting culture results.

## 2015-02-18 ENCOUNTER — Ambulatory Visit (INDEPENDENT_AMBULATORY_CARE_PROVIDER_SITE_OTHER): Payer: Medicare Other | Admitting: Family Medicine

## 2015-02-18 ENCOUNTER — Other Ambulatory Visit: Payer: Self-pay

## 2015-02-18 ENCOUNTER — Encounter: Payer: Self-pay | Admitting: Family Medicine

## 2015-02-18 ENCOUNTER — Ambulatory Visit
Admission: RE | Admit: 2015-02-18 | Discharge: 2015-02-18 | Disposition: A | Discharge status: Home / Self Care | Payer: Medicare Other | Source: Ambulatory Visit | Attending: Family Medicine | Admitting: Family Medicine

## 2015-02-18 VITALS — BP 108/64 | HR 88 | Temp 98.4°F | Resp 18

## 2015-02-18 DIAGNOSIS — S99912A Unspecified injury of left ankle, initial encounter: Secondary | ICD-10-CM | POA: Diagnosis not present

## 2015-02-18 DIAGNOSIS — M7989 Other specified soft tissue disorders: Secondary | ICD-10-CM | POA: Diagnosis not present

## 2015-02-18 DIAGNOSIS — G894 Chronic pain syndrome: Secondary | ICD-10-CM | POA: Insufficient documentation

## 2015-02-18 DIAGNOSIS — M62838 Other muscle spasm: Secondary | ICD-10-CM | POA: Diagnosis not present

## 2015-02-18 DIAGNOSIS — M25571 Pain in right ankle and joints of right foot: Secondary | ICD-10-CM

## 2015-02-18 DIAGNOSIS — M25551 Pain in right hip: Secondary | ICD-10-CM

## 2015-02-18 DIAGNOSIS — M25572 Pain in left ankle and joints of left foot: Principal | ICD-10-CM

## 2015-02-18 DIAGNOSIS — N390 Urinary tract infection, site not specified: Secondary | ICD-10-CM | POA: Diagnosis not present

## 2015-02-18 DIAGNOSIS — S99911A Unspecified injury of right ankle, initial encounter: Secondary | ICD-10-CM | POA: Diagnosis not present

## 2015-02-18 DIAGNOSIS — S79911A Unspecified injury of right hip, initial encounter: Secondary | ICD-10-CM | POA: Diagnosis not present

## 2015-02-18 LAB — POCT URINALYSIS DIPSTICK
Bilirubin, UA: NEGATIVE
Blood, UA: NEGATIVE
Glucose, UA: NEGATIVE
Ketones, UA: NEGATIVE
Nitrite, UA: NEGATIVE
Protein, UA: NEGATIVE
SPEC GRAV UA: 1.02
Urobilinogen, UA: 0.2
pH, UA: 6.5

## 2015-02-18 LAB — POCT UA - MICROSCOPIC ONLY
Bacteria, U Microscopic: 5
Casts, Ur, LPF, POC: 0
Crystals, Ur, HPF, POC: 0
Epithelial cells, urine per micros: 0
MUCUS UA: 0
RBC, URINE, MICROSCOPIC: 0
WBC, Ur, HPF, POC: 15
Yeast, UA: 0

## 2015-02-18 MED ORDER — CIPROFLOXACIN HCL 500 MG PO TABS: 500.0000 mg | ORAL_TABLET | Freq: Two times a day (BID) | ORAL | Status: DC
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
## 2015-02-18 NOTE — Telephone Encounter (Signed)
Patient came in for a office visit today 02/18/2015. Patient seen [name] for urinary symptoms.

## 2015-02-19 ENCOUNTER — Emergency Department
Admission: EM | Admit: 2015-02-19 | Discharge: 2015-02-20 | Disposition: A | Discharge status: Home / Self Care | Payer: Medicare Other | Attending: Emergency Medicine | Admitting: Emergency Medicine

## 2015-02-19 ENCOUNTER — Encounter: Payer: Self-pay | Admitting: Emergency Medicine

## 2015-02-19 DIAGNOSIS — M62838 Other muscle spasm: Secondary | ICD-10-CM | POA: Diagnosis not present

## 2015-02-19 DIAGNOSIS — Z792 Long term (current) use of antibiotics: Secondary | ICD-10-CM | POA: Diagnosis not present

## 2015-02-19 DIAGNOSIS — Z87891 Personal history of nicotine dependence: Secondary | ICD-10-CM | POA: Insufficient documentation

## 2015-02-19 DIAGNOSIS — Z87440 Personal history of urinary (tract) infections: Secondary | ICD-10-CM | POA: Diagnosis not present

## 2015-02-19 DIAGNOSIS — M79605 Pain in left leg: Secondary | ICD-10-CM | POA: Diagnosis present

## 2015-02-19 DIAGNOSIS — Z8669 Personal history of other diseases of the nervous system and sense organs: Secondary | ICD-10-CM | POA: Diagnosis not present

## 2015-02-19 DIAGNOSIS — Z79899 Other long term (current) drug therapy: Secondary | ICD-10-CM | POA: Insufficient documentation

## 2015-02-19 LAB — CBC WITH DIFFERENTIAL/PLATELET
Basophils Absolute: 0 10*3/uL (ref 0–0.1)
Basophils Relative: 0 %
Eosinophils Absolute: 0.2 10*3/uL (ref 0–0.7)
Eosinophils Relative: 2 %
HCT: 39.9 % — ABNORMAL LOW (ref 40.0–52.0)
Hemoglobin: 13.4 g/dL (ref 13.0–18.0)
Lymphocytes Relative: 19 %
Lymphs Abs: 1.7 10*3/uL (ref 1.0–3.6)
MCH: 28.3 pg (ref 26.0–34.0)
MCHC: 33.6 g/dL (ref 32.0–36.0)
MCV: 84.2 fL (ref 80.0–100.0)
Monocytes Absolute: 0.5 10*3/uL (ref 0.2–1.0)
Monocytes Relative: 5 %
NEUTROS ABS: 6.3 10*3/uL (ref 1.4–6.5)
Neutrophils Relative %: 74 %
PLATELETS: 110 10*3/uL — AB (ref 150–440)
RBC: 4.74 MIL/uL (ref 4.40–5.90)
RDW: 13.4 % (ref 11.5–14.5)
WBC: 8.6 10*3/uL (ref 3.8–10.6)

## 2015-02-19 LAB — URINE CULTURE: ORGANISM ID, BACTERIA: NO GROWTH

## 2015-02-19 LAB — BASIC METABOLIC PANEL
ANION GAP: 7 (ref 5–15)
BUN: 10 mg/dL (ref 6–20)
CALCIUM: 9.1 mg/dL (ref 8.9–10.3)
CO2: 27 mmol/L (ref 22–32)
CREATININE: 0.74 mg/dL (ref 0.61–1.24)
Chloride: 101 mmol/L (ref 101–111)
GFR calc Af Amer: >60 mL/min (ref >60)
GFR calc non Af Amer: >60 mL/min (ref >60)
Glucose, Bld: 85 mg/dL (ref 65–99)
POTASSIUM: 4 mmol/L (ref 3.5–5.1)
Sodium: 135 mmol/L (ref 135–145)

## 2015-02-19 LAB — MAGNESIUM: MAGNESIUM: 1.7 mg/dL (ref 1.7–2.4)

## 2015-02-19 MED ORDER — KETOROLAC TROMETHAMINE 30 MG/ML IJ SOLN
INTRAMUSCULAR | Status: AC
  Filled 2015-02-19: qty 1

## 2015-02-19 MED ORDER — KETOROLAC TROMETHAMINE 30 MG/ML IJ SOLN
30.0000 mg | Freq: Once | INTRAMUSCULAR | Status: AC
  Administered 2015-02-19: 30 mg via INTRAVENOUS

## 2015-02-19 MED ORDER — MAGNESIUM CHLORIDE 64 MG PO TBEC
1.0000 | DELAYED_RELEASE_TABLET | Freq: Every day | ORAL | Status: DC
  Administered 2015-02-20: 64 mg via ORAL
  Filled 2015-02-19: qty 1

## 2015-02-19 MED ORDER — ORPHENADRINE CITRATE 30 MG/ML IJ SOLN
60.0000 mg | Freq: Two times a day (BID) | INTRAMUSCULAR | Status: DC
  Administered 2015-02-20: 60 mg via INTRAMUSCULAR

## 2015-02-19 NOTE — ED Notes (Signed)
Patient is an incomplete paraplegic and c/o severe leg spasms that started yesterday. Was seen by PCP yesterday, but no new meds.  Family states when this happens there is something wrong and last time it was cellulitis. No appearance of cellulitis at this time.  History of hip pain as well.  On multiple pain medications as well.
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
## 2015-02-19 NOTE — ED Notes (Signed)
Patient is a paraplegic and started having leg spasms yesterday. Patient was seen at his pcp yesterday and was started on 2 antibiotics for uti and for tooth infection. Patient states that in the past when he has had spasms like this before he had cellulitis on his legs. Patient with no redness to his legs at this time. Patient takes 20mg   methadone and 5mg  oxycodone as needed and it is not controlling his pain.

## 2015-02-20 DIAGNOSIS — M62838 Other muscle spasm: Secondary | ICD-10-CM | POA: Diagnosis not present

## 2015-02-20 MED ORDER — PREDNISONE 10 MG PO TABS
ORAL_TABLET | ORAL | Status: DC
Start: 2015-02-20 — End: 2015-07-20

## 2015-02-20 MED ORDER — PREDNISONE 20 MG PO TABS
40.0000 mg | ORAL_TABLET | Freq: Once | ORAL | Status: AC
  Administered 2015-02-20: 40 mg via ORAL

## 2015-02-20 MED ORDER — ORPHENADRINE CITRATE 30 MG/ML IJ SOLN
INTRAMUSCULAR | Status: AC
  Administered 2015-02-20: 60 mg via INTRAMUSCULAR
  Filled 2015-02-20: qty 2

## 2015-02-20 MED ORDER — PREDNISONE 20 MG PO TABS
ORAL_TABLET | ORAL | Status: AC
  Administered 2015-02-20: 40 mg via ORAL
  Filled 2015-02-20: qty 2

## 2015-02-20 NOTE — Discharge Instructions (Signed)
° °  CONTINUE YOUR REGULAR MEDICATIONS AS DIRECTED. CALL YOUR DOCTOR ON Monday FOR POSSIBLE REFERRAL TO A NEUROLOGIST

## 2015-02-20 NOTE — ED Notes (Signed)
Patient with no complaints at this time. Respirations even and unlabored. Skin warm/dry. Discharge instructions reviewed with patient at this time. Patient given opportunity to voice concerns/ask questions. IV removed per policy and band-aid applied to site. Patient discharged at this time and left Emergency Department, via wheelchair.   

## 2015-02-21 ENCOUNTER — Telehealth: Payer: Self-pay

## 2015-02-21 NOTE — Telephone Encounter (Signed)
Patient advised as directed below. Patient verbalized understanding and agrees to proceed with referral.  

## 2015-02-21 NOTE — Telephone Encounter (Signed)
-----   Message from Jodell Ciproennis E Vandiverhrismon, [place] sent at 02/21/2015  1:36 PM EDT ----- No acute problem in right hip, just some degeneration. Left hip raises the question of acetabular fracture. Ankles have arthritic changes without any acute changes. Proceed with referral to orthopedist. Urine culture negative for bacteria. Finish the Cipro and call or return if any further signs of infection.

## 2015-02-22 ENCOUNTER — Encounter: Payer: Self-pay | Admitting: Family Medicine

## 2015-02-22 DIAGNOSIS — S32491A Other specified fracture of right acetabulum, initial encounter for closed fracture: Secondary | ICD-10-CM | POA: Diagnosis not present

## 2015-02-23 ENCOUNTER — Telehealth: Payer: Self-pay | Admitting: Family Medicine

## 2015-02-23 DIAGNOSIS — G8929 Other chronic pain: Secondary | ICD-10-CM

## 2015-02-23 MED ORDER — METHADONE HCL 10 MG PO TABS: 30.0000 mg | ORAL_TABLET | Freq: Three times a day (TID) | ORAL | Status: DC

## 2015-02-23 MED ORDER — OXYCODONE HCL 15 MG PO TABS: 15.0000 mg | ORAL_TABLET | ORAL | Status: DC

## 2015-02-23 NOTE — Telephone Encounter (Addendum)
[facility] [phone]) [phone]. Patient also stated that he has been taking extra doses of his methadone and oxycodone. Pt is wanting other refill on theses 2 meds.

## 2015-02-23 NOTE — Telephone Encounter (Signed)
Please refer as requested 

## 2015-02-23 NOTE — Telephone Encounter (Signed)
Pt wanted a nurse to call him back so he can update on what the other doctor told the pt. Thanks TNP

## 2015-02-28 ENCOUNTER — Telehealth: Payer: Self-pay | Admitting: Family Medicine

## 2015-02-28 NOTE — Telephone Encounter (Signed)
Returned patient's call. Patient states that he was seen by Ortho for hip fracture. Patient states the provider he seen was to send a letter to Dr. [name] to inform him that patient would need pain medication more often with the hip fracture. Patient states he is having to take Methadone 4 times a day and OxyContin 4 times a day. Patient states he is afraid he will run out of the Methadone early.

## 2015-02-28 NOTE — Telephone Encounter (Signed)
Pt would like to speak with Maurine Minister' nurse. Thanks TNP

## 2015-03-01 ENCOUNTER — Other Ambulatory Visit: Payer: Self-pay | Admitting: Family Medicine

## 2015-03-01 DIAGNOSIS — N390 Urinary tract infection, site not specified: Secondary | ICD-10-CM | POA: Diagnosis not present

## 2015-03-03 ENCOUNTER — Telehealth: Payer: Self-pay | Admitting: Family Medicine

## 2015-03-03 NOTE — Telephone Encounter (Signed)
Methadone being prescribed by Dr. [name].

## 2015-03-03 NOTE — Telephone Encounter (Signed)
Pt called to advise of pain level.  Pt states he is taking 5 pills every 6 hours of the Methadone.  Pt states he will continue to bring this down.  CB#[phone]

## 2015-03-04 LAB — URINE CULTURE

## 2015-03-08 ENCOUNTER — Telehealth: Payer: Self-pay

## 2015-03-08 DIAGNOSIS — N390 Urinary tract infection, site not specified: Secondary | ICD-10-CM

## 2015-03-08 NOTE — Telephone Encounter (Signed)
-----   Message from Marjie Skiff, [place] sent at 03/04/2015  4:43 PM EDT ----- Left message for patient to contact office.

## 2015-03-08 NOTE — Telephone Encounter (Signed)
Pt advised of Urine culture.  He says he has not been to the urologist yet.  But needs a referral to [facility]   So he can see the urologist there.   Thanks,   -Vernona Rieger

## 2015-03-09 NOTE — Telephone Encounter (Signed)
Patient called office again also wanting a referral to Dr. Sampson Goon (specializes in infectious disease) who is also with [facility]. Contact number in chart is correct.

## 2015-03-10 NOTE — Telephone Encounter (Signed)
Pt is requesting referral to urologist for UTI symptoms.Please add order to Epic if you approve.This message was left on my voicemail

## 2015-03-11 ENCOUNTER — Telehealth: Payer: Self-pay

## 2015-03-11 ENCOUNTER — Telehealth: Payer: Self-pay | Admitting: Family Medicine

## 2015-03-11 DIAGNOSIS — N390 Urinary tract infection, site not specified: Secondary | ICD-10-CM | POA: Insufficient documentation

## 2015-03-11 MED ORDER — NITROFURANTOIN MONOHYD MACRO 100 MG PO CAPS
100.0000 mg | ORAL_CAPSULE | Freq: Two times a day (BID) | ORAL | Status: DC
Start: 2015-03-11 — End: 2015-05-27

## 2015-03-11 NOTE — Telephone Encounter (Signed)
Sherry returned call. See other message.

## 2015-03-11 NOTE — Telephone Encounter (Signed)
Pt's mom would like to speak to a nurse today about pt's on going bladder infection. Mom would like to speak with a nurse as soon as possible. Thanks TNP

## 2015-03-11 NOTE — Telephone Encounter (Signed)
Advise patient that the culture isolated E.coli bacteria that was resistant to nearly everything except the Macrobid (nitrofurantoin). Will be sure that prescription is at the pharmacy at the full dose. Should proceed with urology appointment.
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
## 2015-03-11 NOTE — Telephone Encounter (Signed)
LMOVM for Justin [name] to return call.

## 2015-03-22 DIAGNOSIS — G8220 Paraplegia, unspecified: Secondary | ICD-10-CM | POA: Diagnosis not present

## 2015-03-22 DIAGNOSIS — N319 Neuromuscular dysfunction of bladder, unspecified: Secondary | ICD-10-CM | POA: Diagnosis not present

## 2015-03-22 DIAGNOSIS — Z1630 Resistance to unspecified antimicrobial drugs: Secondary | ICD-10-CM | POA: Insufficient documentation

## 2015-03-22 DIAGNOSIS — Z1635 Resistance to multiple antimicrobial drugs: Secondary | ICD-10-CM | POA: Diagnosis not present

## 2015-03-22 DIAGNOSIS — N390 Urinary tract infection, site not specified: Secondary | ICD-10-CM | POA: Diagnosis not present

## 2015-03-25 ENCOUNTER — Telehealth: Payer: Self-pay | Admitting: Family Medicine

## 2015-03-25 NOTE — Telephone Encounter (Signed)
Pt is requesting that we send his last 2 OV notes and all the times that he has had a positive urine culture to [facility] phone# [phone] because he is trying to get a specific type of catheter. Pt stated that our office has done an order for this and he has been trying to get this for awhile. Pt stated that [facility] told pt they have faxed us the request but haven't received anything back from our office. Pt is scheduled for F/U on 03/30/15 and I advised pt that he may need to sign a release form and he stated he has already signed a release form. Thanks TNP

## 2015-03-29 NOTE — Telephone Encounter (Signed)
OK to send results of all urine cultures and last two office notes as requested.

## 2015-03-29 NOTE — Telephone Encounter (Signed)
Information was faxed to [facility] [phone].

## 2015-03-30 ENCOUNTER — Ambulatory Visit (INDEPENDENT_AMBULATORY_CARE_PROVIDER_SITE_OTHER): Payer: Medicare Other | Admitting: Family Medicine

## 2015-03-30 VITALS — BP 98/64 | HR 84 | Resp 16

## 2015-03-30 DIAGNOSIS — M8440XA Pathological fracture, unspecified site, initial encounter for fracture: Secondary | ICD-10-CM | POA: Diagnosis not present

## 2015-03-30 DIAGNOSIS — R252 Cramp and spasm: Secondary | ICD-10-CM

## 2015-03-30 DIAGNOSIS — F329 Major depressive disorder, single episode, unspecified: Secondary | ICD-10-CM

## 2015-03-30 DIAGNOSIS — G8220 Paraplegia, unspecified: Secondary | ICD-10-CM | POA: Diagnosis not present

## 2015-03-30 DIAGNOSIS — F32A Depression, unspecified: Secondary | ICD-10-CM

## 2015-03-30 DIAGNOSIS — G894 Chronic pain syndrome: Secondary | ICD-10-CM

## 2015-03-30 DIAGNOSIS — N390 Urinary tract infection, site not specified: Secondary | ICD-10-CM

## 2015-03-30 MED ORDER — METHADONE HCL 10 MG PO TABS: 30.0000 mg | ORAL_TABLET | Freq: Three times a day (TID) | ORAL | Status: DC

## 2015-03-30 MED ORDER — OXYCODONE HCL 15 MG PO TABS: 15.0000 mg | ORAL_TABLET | ORAL | Status: DC

## 2015-03-30 MED ORDER — PAROXETINE HCL 40 MG PO TABS: 40.0000 mg | ORAL_TABLET | Freq: Every day | ORAL | Status: DC

## 2015-04-05 NOTE — Progress Notes (Signed)
Patient: Justin RinneJoseph Nathaniel Deshmukh Male    DOB: 1981-01-23   34 y.o.   MRN: [id number] Visit Date: 04/05/2015  Today's Provider: Mila Merryonald Fisher, MD   Chief Complaint  Patient presents with  . Hip Injury    follow up    Subjective:    HPI  Follow up hip fracture: Patient was seen in early June by Dortha Kernennis Chrismon after fall and found to have suspected acetabular fracture of right hip. He had to take a little more pain medication than usual, but is now back to his previous regiment. He is concerned about possiblity of osteoporosis, and is at increased risk due to being on chronic pain medications.     Spinal cord injury He request referral to a spinal injury clinic for coordination of all of his chronic medical problems related to spinal injury. He has been looking into a few programs and states [facility]. He is not quite  ready to schedule this.   Chronic UTI.  Since last visit he has been followed by I.D. Dr Eulah PontMurphy who has placed him on a regiment of prophylactic antibiotics.    Chronic pain He continues on methadone and oxycodone which he has been taking for years, and states are helpful, but not completley controlling his pain. He is scheduled [facility] in August.    Allergies  Allergen Reactions  . Azithromycin     throat closes  . Iodine   . Lorazepam     extremely hostile  . Meperidine   . Morphine Sulfate     Psychotic symptoms   Previous Medications   ALPRAZOLAM (XANAX) 2 MG TABLET    Take 1 tablet by mouth every 6 (six) hours as needed.   AMPHETAMINE-DEXTROAMPHETAMINE (ADDERALL XR) 30 MG 24 HR CAPSULE    Take 1 capsule by mouth daily.   BACLOFEN (LIORESAL) 20 MG TABLET    Take 1 tablet by mouth every 4 (four) hours.   CIPROFLOXACIN (CIPRO) 500 MG TABLET    Take 1 tablet (500 mg total) by mouth 2 (two) times daily.   CLOTRIMAZOLE-BETAMETHASONE (LOTRISONE) CREAM    1 application 2 (two) times  daily.   DANTROLENE (DANTRIUM) 25 MG CAPSULE    Take 1 capsule by mouth daily.   DIPHENHYDRAMINE (BENADRYL) 25 MG TABLET    Take 1 tablet by mouth as needed.   DOCUSATE SODIUM (COLACE) 250 MG CAPSULE    Take 1 capsule by mouth 4 (four) times daily as needed.   DRONABINOL (MARINOL) 10 MG CAPSULE    Take 1 capsule by mouth every 6 (six) hours as needed.   GABAPENTIN (NEURONTIN) 800 MG TABLET    Take 1 tablet by mouth every 8 (eight) hours.   LIDOCAINE (LMX) 4 % CREAM    1 application 3 (three) times daily.   LUBIPROSTONE (AMITIZA) 24 MCG CAPSULE    Take 1 capsule by mouth 2 (two) times daily.   MECLIZINE (ANTIVERT) 25 MG TABLET    Take 1 tablet by mouth as needed.   MINERAL OIL OIL    daily.   NAPROXEN (NAPROSYN) 500 MG TABLET    Take 1 tablet by mouth 2 (two) times daily.   NITROFURANTOIN, MACROCRYSTAL-MONOHYDRATE, (MACROBID) 100 MG CAPSULE    Take 1 capsule (100 mg total) by mouth 2 (two) times daily.   PHENAZOPYRIDINE (PYRIDIUM) 200 MG TABLET    Take 1 tablet by mouth 3 (three)  times daily.   POLYETHYLENE GLYCOL POWDER (GLYCOLAX/MIRALAX) POWDER    Take 17 g by mouth as needed.   PREDNISONE (DELTASONE) 10 MG TABLET    3 tabs q day x 3 days   PROMETHAZINE (PHENERGAN) 25 MG TABLET    Take 1 tablet by mouth every 4 (four) hours as needed.   TIZANIDINE (ZANAFLEX) 4 MG TABLET    Take 2 tablets by mouth every 4 (four) hours as needed.    Review of Systems  Constitutional: Negative for fever and chills.  Respiratory: Negative for chest tightness and shortness of breath.   Cardiovascular: Negative for chest pain, palpitations and leg swelling.  Gastrointestinal: Negative for abdominal pain.  Endocrine: Negative for heat intolerance, polydipsia and polyphagia.  Neurological: Negative for dizziness, tremors, weakness and headaches.    History  Substance Use Topics  . Smoking status: Former Games developer  . Smokeless tobacco: Not on file     Comment: Using Nicorette Gum  . Alcohol Use: 0.0 oz/week     0 Standard drinks or equivalent per week     Comment: occasionaly   Objective:   BP 98/64 mmHg  Pulse 84  Resp 16  Wt   Physical Exam General appearance: alert, well developed, well nourished, cooperative and in no distress Head: Normocephalic, without obvious abnormality, atraumatic Lungs: Respirations even and unlabored Extremities: No gross deformities Skin: Skin color, texture, turgor normal. No rashes seen  Psych: Appropriate mood and affect. Neurologic: Mental status: Alert, oriented to person, place, and time, thought content appropriate.      Assessment & Plan:      1. Pathological fracture, initial encounter  - DG HIP UNILAT W OR W/O PELVIS 2-3 VIEWS LEFT; Future - DG Bone Density; Future  2. Chronic pain associated with significant psychosocial dysfunction Stable. Is scheduled at pain clinic at [facility] next month - oxyCODONE (ROXICODONE) 15 MG immediate release tablet; Take 1 tablet (15 mg total) by mouth every 4 (four) hours.  Dispense: 120 tablet; Refill: 0 - methadone (DOLOPHINE) 10 MG tablet; Take 3 tablets (30 mg total) by mouth every 8 (eight) hours.  Dispense: 270 tablet; Refill: 0  3. Clinical depression Doing well with current SSRI. Continue current medications.   - PARoxetine (PAXIL) 40 MG tablet; Take 1 tablet (40 mg total) by mouth daily.  Dispense: 90 tablet; Refill: 1  4. Lower paraplegia He is going to call back for referral to Spinal injury clinic.   5. Spasm Fairly well controlled on current medications.   6. Chronic UTI Continue routine follow up ID.  Addressed extensive list of chronic and acute medical problems today requiring extensive time in counseling and coordination care.  Over half of this 45 minute visit were spent in counseling and coordinating care of multiple medical problems.       Mila Merry, MD  [facility]  [facility]

## 2015-04-13 ENCOUNTER — Ambulatory Visit: Admission: RE | Admit: 2015-04-13 | Payer: Medicare Other | Source: Ambulatory Visit

## 2015-04-14 ENCOUNTER — Ambulatory Visit: Payer: Medicare Other

## 2015-04-14 ENCOUNTER — Encounter: Payer: Self-pay | Admitting: Urology

## 2015-04-21 ENCOUNTER — Other Ambulatory Visit: Payer: Medicare Other

## 2015-04-28 DIAGNOSIS — S24109A Unspecified injury at unspecified level of thoracic spinal cord, initial encounter: Secondary | ICD-10-CM | POA: Diagnosis not present

## 2015-04-28 DIAGNOSIS — N200 Calculus of kidney: Secondary | ICD-10-CM | POA: Diagnosis not present

## 2015-04-28 DIAGNOSIS — N319 Neuromuscular dysfunction of bladder, unspecified: Secondary | ICD-10-CM | POA: Diagnosis not present

## 2015-04-28 DIAGNOSIS — F329 Major depressive disorder, single episode, unspecified: Secondary | ICD-10-CM | POA: Diagnosis not present

## 2015-05-05 ENCOUNTER — Ambulatory Visit: Payer: Medicare Other

## 2015-05-16 ENCOUNTER — Ambulatory Visit: Payer: Medicare Other

## 2015-05-27 ENCOUNTER — Ambulatory Visit (INDEPENDENT_AMBULATORY_CARE_PROVIDER_SITE_OTHER): Payer: Medicare Other | Admitting: Family Medicine

## 2015-05-27 ENCOUNTER — Encounter: Payer: Self-pay | Admitting: Family Medicine

## 2015-05-27 VITALS — BP 120/80 | HR 76 | Temp 98.8°F | Resp 16

## 2015-05-27 DIAGNOSIS — K5900 Constipation, unspecified: Secondary | ICD-10-CM

## 2015-05-27 DIAGNOSIS — F419 Anxiety disorder, unspecified: Secondary | ICD-10-CM

## 2015-05-27 DIAGNOSIS — F329 Major depressive disorder, single episode, unspecified: Secondary | ICD-10-CM

## 2015-05-27 DIAGNOSIS — F32A Depression, unspecified: Secondary | ICD-10-CM

## 2015-05-27 DIAGNOSIS — G8220 Paraplegia, unspecified: Secondary | ICD-10-CM | POA: Diagnosis not present

## 2015-05-27 DIAGNOSIS — G894 Chronic pain syndrome: Secondary | ICD-10-CM

## 2015-05-27 DIAGNOSIS — N390 Urinary tract infection, site not specified: Secondary | ICD-10-CM

## 2015-05-27 DIAGNOSIS — N139 Obstructive and reflux uropathy, unspecified: Secondary | ICD-10-CM | POA: Diagnosis not present

## 2015-05-27 DIAGNOSIS — K5909 Other constipation: Secondary | ICD-10-CM

## 2015-05-27 LAB — POCT URINALYSIS DIPSTICK
Bilirubin, UA: NEGATIVE
Blood, UA: NEGATIVE
Glucose, UA: NEGATIVE
Ketones, UA: NEGATIVE
Leukocytes, UA: NEGATIVE
Nitrite, UA: NEGATIVE
PROTEIN UA: NEGATIVE
Spec Grav, UA: 1.02
Urobilinogen, UA: 0.2
pH, UA: 6.5

## 2015-05-27 MED ORDER — PAROXETINE HCL 40 MG PO TABS: 40.0000 mg | ORAL_TABLET | Freq: Every day | ORAL | Status: DC

## 2015-05-27 MED ORDER — OXYCODONE HCL 15 MG PO TABS: 15.0000 mg | ORAL_TABLET | ORAL | Status: DC

## 2015-05-27 MED ORDER — NITROFURANTOIN MONOHYD MACRO 100 MG PO CAPS: 100.0000 mg | ORAL_CAPSULE | Freq: Two times a day (BID) | ORAL | Status: DC

## 2015-05-27 MED ORDER — SULFAMETHOXAZOLE-TRIMETHOPRIM 800-160 MG PO TABS
1.0000 | ORAL_TABLET | Freq: Two times a day (BID) | ORAL | Status: DC
Start: 2015-05-27 — End: 2019-08-17

## 2015-05-27 MED ORDER — CLOTRIMAZOLE-BETAMETHASONE 1-0.05 % EX CREA: 1.0000 "application " | TOPICAL_CREAM | Freq: Two times a day (BID) | CUTANEOUS | Status: DC

## 2015-05-27 MED ORDER — LUBIPROSTONE 24 MCG PO CAPS: 24.0000 ug | ORAL_CAPSULE | Freq: Two times a day (BID) | ORAL | Status: DC

## 2015-05-27 MED ORDER — METHADONE HCL 10 MG PO TABS: 30.0000 mg | ORAL_TABLET | Freq: Three times a day (TID) | ORAL | Status: DC

## 2015-05-27 MED ORDER — ALPRAZOLAM 2 MG PO TABS: 2.0000 mg | ORAL_TABLET | Freq: Four times a day (QID) | ORAL | Status: DC | PRN

## 2015-05-27 MED ORDER — TIZANIDINE HCL 4 MG PO TABS: 8.0000 mg | ORAL_TABLET | ORAL | Status: DC | PRN

## 2015-05-27 NOTE — Progress Notes (Signed)
Patient: Justin [name] Male    DOB: 10-Aug-1981   34 y.o.   MRN: [id number] Visit Date: 05/27/2015  Today's Provider: Mila Merry, MD   Chief Complaint  Patient presents with  . Urinary Tract Infection   Subjective:    HPI  Patient is having severe bladder spasms due to UTI   No fevers or chills. Has no sensation lower back. Using cath per urology recommendations. States urologist, Dr. Patricia Nettle culture to be done.Has urology follow up 06-27-15.  Also needs information sent to medication supply company for cath supplies.   Depression/Medication follow up  States that his current antidepressant and pain medications are working well. Needs refill and does not feel he needs any changes to regiment.  Allergies  Allergen Reactions  . Azithromycin     throat closes  . Iodine   . Lorazepam     extremely hostile  . Meperidine   . Morphine Sulfate     Psychotic symptoms   Previous Medications   ALPRAZOLAM (XANAX) 2 MG TABLET    Take 1 tablet by mouth every 6 (six) hours as needed.   AMPHETAMINE-DEXTROAMPHETAMINE (ADDERALL XR) 30 MG 24 HR CAPSULE    Take 1 capsule by mouth daily.   BACLOFEN (LIORESAL) 20 MG TABLET    Take 1 tablet by mouth every 4 (four) hours.   CIPROFLOXACIN (CIPRO) 500 MG TABLET    Take 1 tablet (500 mg total) by mouth 2 (two) times daily.   CLOTRIMAZOLE-BETAMETHASONE (LOTRISONE) CREAM    1 application 2 (two) times daily.   DANTROLENE (DANTRIUM) 25 MG CAPSULE    Take 1 capsule by mouth daily.   DIPHENHYDRAMINE (BENADRYL) 25 MG TABLET    Take 1 tablet by mouth as needed.   DOCUSATE SODIUM (COLACE) 250 MG CAPSULE    Take 1 capsule by mouth 4 (four) times daily as needed.   DRONABINOL (MARINOL) 10 MG CAPSULE    Take 1 capsule by mouth every 6 (six) hours as needed.   GABAPENTIN (NEURONTIN) 800 MG TABLET    Take 1 tablet by mouth every 8 (eight) hours.   LIDOCAINE (LMX) 4 % CREAM    1 application 3 (three) times daily.   LUBIPROSTONE (AMITIZA)  24 MCG CAPSULE    Take 1 capsule by mouth 2 (two) times daily.   MECLIZINE (ANTIVERT) 25 MG TABLET    Take 1 tablet by mouth as needed.   METHADONE (DOLOPHINE) 10 MG TABLET    Take 3 tablets (30 mg total) by mouth every 8 (eight) hours.   MINERAL OIL OIL    daily.   NAPROXEN (NAPROSYN) 500 MG TABLET    Take 1 tablet by mouth 2 (two) times daily.   NITROFURANTOIN, MACROCRYSTAL-MONOHYDRATE, (MACROBID) 100 MG CAPSULE    Take 1 capsule (100 mg total) by mouth 2 (two) times daily.   OXYCODONE (ROXICODONE) 15 MG IMMEDIATE RELEASE TABLET    Take 1 tablet (15 mg total) by mouth every 4 (four) hours.   PAROXETINE (PAXIL) 40 MG TABLET    Take 1 tablet (40 mg total) by mouth daily.   PHENAZOPYRIDINE (PYRIDIUM) 200 MG TABLET    Take 1 tablet by mouth 3 (three) times daily.   POLYETHYLENE GLYCOL POWDER (GLYCOLAX/MIRALAX) POWDER    Take 17 g by mouth as needed.   PREDNISONE (DELTASONE) 10 MG TABLET    3 tabs q day x 3 days   PROMETHAZINE (PHENERGAN) 25 MG TABLET    Take 1  tablet by mouth every 4 (four) hours as needed.   TIZANIDINE (ZANAFLEX) 4 MG TABLET    Take 2 tablets by mouth every 4 (four) hours as needed.    Review of Systems  Constitutional: Positive for fever. Negative for chills and fatigue.  Respiratory: Negative for shortness of breath.   Gastrointestinal: Negative for nausea, vomiting, abdominal pain, diarrhea and constipation.  Genitourinary: Positive for difficulty urinating. Negative for flank pain.    Social History  Substance Use Topics  . Smoking status: Former Games developer  . Smokeless tobacco: Not on file     Comment: Using Nicorette Gum  . Alcohol Use: 0.0 oz/week    0 Standard drinks or equivalent per week     Comment: occasionaly   Objective:   BP 120/80 mmHg  Pulse 76  Temp(Src) 98.8 F (37.1 C)  Resp 16  SpO2 97%  Physical Exam   General Appearance:    Alert, cooperative, no distress  Eyes:    PERRL, conjunctiva/corneas clear, EOM's intact       Lungs:     Clear to  auscultation bilaterally, respirations unlabored  Heart:    Regular rate and rhythm  Neurologic:   Awake, alert, oriented x 3.            Assessment & Plan:     1. Clinical depression Continue current medications.   - PARoxetine (PAXIL) 40 MG tablet; Take 1 tablet (40 mg total) by mouth daily.  Dispense: 90 tablet; Refill: 1  2. Chronic pain associated with significant psychosocial dysfunction well controlled Continue current medications.   - methadone (DOLOPHINE) 10 MG tablet; Take 3 tablets (30 mg total) by mouth every 8 (eight) hours.  Dispense: 270 tablet; Refill: 0 - oxyCODONE (ROXICODONE) 15 MG immediate release tablet; Take 1 tablet (15 mg total) by mouth every 4 (four) hours.  Dispense: 120 tablet; Refill: 0  3. UTI (lower urinary tract infection)  - nitrofurantoin, macrocrystal-monohydrate, (MACROBID) 100 MG capsule; Take 1 capsule (100 mg total) by mouth 2 (two) times daily.  Dispense: 60 capsule; Refill: 0 - sulfamethoxazole-trimethoprim (BACTRIM DS,SEPTRA DS) 800-160 MG per tablet; Take 1 tablet by mouth 2 (two) times daily.  Dispense: 14 tablet; Refill: 0 - Urine culture - POCT urinalysis dipstick  4. Anxiety Continue current medications.   - alprazolam (XANAX) 2 MG tablet; Take 1 tablet (2 mg total) by mouth every 6 (six) hours as needed.  Dispense: 120 tablet; Refill: 5  5. Lower paraplegia Stable. Needs rf tizanidine - tiZANidine (ZANAFLEX) 4 MG tablet; Take 2 tablets (8 mg total) by mouth every 4 (four) hours as needed.  Dispense: 120 tablet; Refill: 5  6. Chronic constipation Stable. Continue current medications.   - lubiprostone (AMITIZA) 24 MCG capsule; Take 1 capsule (24 mcg total) by mouth 2 (two) times daily.  Dispense: 60 capsule; Refill: 12   Patient requests we contact medical supply company to given them necessary information regarding cath supplies. States to call Maurine Minister at [phone] (fax: [phone])      Mila Merry, MD  [facility]  [facility]

## 2015-05-29 LAB — URINE CULTURE: Organism ID, Bacteria: NO GROWTH

## 2015-06-27 DIAGNOSIS — N393 Stress incontinence (female) (male): Secondary | ICD-10-CM | POA: Diagnosis not present

## 2015-06-27 DIAGNOSIS — N319 Neuromuscular dysfunction of bladder, unspecified: Secondary | ICD-10-CM | POA: Diagnosis not present

## 2015-06-27 DIAGNOSIS — R109 Unspecified abdominal pain: Secondary | ICD-10-CM | POA: Diagnosis not present

## 2015-06-27 DIAGNOSIS — M25451 Effusion, right hip: Secondary | ICD-10-CM | POA: Diagnosis not present

## 2015-06-27 DIAGNOSIS — S34109S Unspecified injury to unspecified level of lumbar spinal cord, sequela: Secondary | ICD-10-CM | POA: Diagnosis not present

## 2015-07-15 ENCOUNTER — Telehealth: Payer: Self-pay | Admitting: Family Medicine

## 2015-07-15 DIAGNOSIS — N390 Urinary tract infection, site not specified: Secondary | ICD-10-CM

## 2015-07-15 DIAGNOSIS — R350 Frequency of micturition: Secondary | ICD-10-CM

## 2015-07-15 NOTE — Telephone Encounter (Signed)
Spoke with patient on the phone who states that he is unable to come in to office for visit, I had advised patient of our Saturday clinic hrs as well. Patient states that you allow him to drop off speciman normally and send it off for culture. Patient states that he will have his mother drop off sample to us and request that we culture it and send results to Dr. Raoul PitchBorawski ( urologist), her office number is [phone]. Please advise. Kw

## 2015-07-15 NOTE — Telephone Encounter (Signed)
Pt states he is having burning in his bladder, frequency and leg spasms.  Pt also states his urine does have an odor.  Pt is asking if he can have his mom bring in a urine sample and not have to come in.  CB#[phone]/MW

## 2015-07-17 DIAGNOSIS — N3289 Other specified disorders of bladder: Secondary | ICD-10-CM | POA: Diagnosis not present

## 2015-07-17 DIAGNOSIS — M25551 Pain in right hip: Secondary | ICD-10-CM | POA: Diagnosis not present

## 2015-07-18 ENCOUNTER — Other Ambulatory Visit: Payer: Self-pay | Admitting: Family Medicine

## 2015-07-18 DIAGNOSIS — R350 Frequency of micturition: Secondary | ICD-10-CM | POA: Diagnosis not present

## 2015-07-18 DIAGNOSIS — N390 Urinary tract infection, site not specified: Secondary | ICD-10-CM

## 2015-07-18 MED ORDER — CIPROFLOXACIN HCL 500 MG PO TABS: 500.0000 mg | ORAL_TABLET | Freq: Two times a day (BID) | ORAL | Status: DC

## 2015-07-18 NOTE — Telephone Encounter (Signed)
Patient is requesting rx for burning and frequency. Patient stated that he is in a lot of pain.

## 2015-07-18 NOTE — Telephone Encounter (Signed)
Pt called about the results. I advised they were not back yet. Pt requested that a nurse return his call. Thanks TNP

## 2015-07-18 NOTE — Telephone Encounter (Signed)
Have sent rx for cipro to [facility]

## 2015-07-18 NOTE — Telephone Encounter (Signed)
Pt's mom [name] stated that pt is in a lot of pain and wanted to know if the urine that was dropped off Friday had been cultured and if we have the results. Please advise. Thanks TNP

## 2015-07-18 NOTE — Telephone Encounter (Signed)
Urine culture was just sent today. Results are not back yet.

## 2015-07-18 NOTE — Telephone Encounter (Signed)
Patient aware prescriptions was called into [facility].   Thanks,  -Joseline

## 2015-07-19 LAB — URINE CULTURE: ORGANISM ID, BACTERIA: NO GROWTH

## 2015-07-20 ENCOUNTER — Other Ambulatory Visit: Payer: Self-pay

## 2015-07-20 ENCOUNTER — Telehealth: Payer: Self-pay

## 2015-07-20 NOTE — Telephone Encounter (Signed)
Patient advised of results. Patient states he is most certain that he has a bladder infection. He states he has been having spasms. Patient states he will contact his Urologist to follow up with them.

## 2015-07-20 NOTE — Telephone Encounter (Signed)
-----   Message from Malva Limesonald E Fisher, MD sent at 07/19/2015 10:03 PM EDT ----- Urine culture is negative.

## 2015-07-21 ENCOUNTER — Ambulatory Visit (INDEPENDENT_AMBULATORY_CARE_PROVIDER_SITE_OTHER): Payer: Medicare Other | Admitting: Family Medicine

## 2015-07-21 ENCOUNTER — Telehealth: Payer: Self-pay | Admitting: Family Medicine

## 2015-07-21 ENCOUNTER — Other Ambulatory Visit: Payer: Self-pay | Admitting: Family Medicine

## 2015-07-21 ENCOUNTER — Encounter: Payer: Self-pay | Admitting: Family Medicine

## 2015-07-21 VITALS — BP 150/98 | HR 100 | Temp 97.7°F | Resp 18

## 2015-07-21 DIAGNOSIS — I10 Essential (primary) hypertension: Secondary | ICD-10-CM | POA: Diagnosis not present

## 2015-07-21 DIAGNOSIS — N390 Urinary tract infection, site not specified: Secondary | ICD-10-CM | POA: Diagnosis not present

## 2015-07-21 DIAGNOSIS — G8220 Paraplegia, unspecified: Secondary | ICD-10-CM | POA: Diagnosis not present

## 2015-07-21 DIAGNOSIS — G894 Chronic pain syndrome: Secondary | ICD-10-CM

## 2015-07-21 DIAGNOSIS — Z23 Encounter for immunization: Secondary | ICD-10-CM | POA: Diagnosis not present

## 2015-07-21 DIAGNOSIS — M25551 Pain in right hip: Secondary | ICD-10-CM

## 2015-07-21 NOTE — Telephone Encounter (Signed)
Pt's mom [name] stated that at the OV on 07/21/15 with  [name] she was told  pt should go to ortho. [name] is requesting a referral since pt has [place] access and would like the appt asap b/c pt is in so much pain. [name] would like the referral to be for [name] with [name]. Phone# [phone] is the office #. Please advise. Thanks TNP
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
## 2015-07-21 NOTE — Telephone Encounter (Signed)
Please advise 

## 2015-07-22 LAB — URINE CULTURE: ORGANISM ID, BACTERIA: NO GROWTH

## 2015-07-25 ENCOUNTER — Telehealth: Payer: Self-pay

## 2015-07-25 NOTE — Telephone Encounter (Deleted)
-----   Message from Tamsen Roersennis E Chrismon, [place] sent at 07/25/2015  8:01 AM EST ----- No bacterial growth on urine culture. Should have follow up appointment with his urologist if symptoms or concerns persist.

## 2015-07-25 NOTE — Telephone Encounter (Signed)
Patient advised as directed below. Patient verbalized understanding and agrees to plan of care.  

## 2015-07-25 NOTE — Telephone Encounter (Signed)
Pt stated that he would like to be referred to a surgeon that specializes in operating on paralyzed pts. Pt stated that he is still in a lot of pain and isn't sleeping b/c of the pain. Pt stated that the pain medication isn't providing any relief. Pt stated that if he needs to come back in the office he will but he would like to know what he could try to help with the pain. Please advise. Thanks TNP

## 2015-07-25 NOTE — Telephone Encounter (Signed)
Patient advised as directed below. Patient states the leg spasms and hip pain is worse. Patient states he has not slept in 2 days due to the increased pain. Please advise.

## 2015-07-25 NOTE — Telephone Encounter (Signed)
-----   Message from Dennis E Chrismon, PA sent at 07/25/2015  8:01 AM EST ----- No bacterial growth on urine culture. Should have follow up appointment with his urologist if symptoms or concerns persist. 

## 2015-07-25 NOTE — Telephone Encounter (Signed)
Schedule referral to orthopedist (patient prefers Dr. Eul[facility]Murphy with [facility] due to pain in thighs and hips. Also, may need [facility].

## 2015-07-25 NOTE — Telephone Encounter (Signed)
Pt is calling again, requesting a call back/MW

## 2015-07-26 ENCOUNTER — Telehealth: Payer: Self-pay | Admitting: Family Medicine

## 2015-07-26 NOTE — Telephone Encounter (Signed)
Patient was referred to Dr. Raoul PitchBorawski 03/11/2015. Please advise?

## 2015-07-26 NOTE — Telephone Encounter (Signed)
Pt's mother [name] is requesting a referral to Dr Raoul PitchBorawski (Phone [phone]) for pain with urination

## 2015-07-27 NOTE — Telephone Encounter (Signed)
Dr Jeneen MontgomeryBorawski's office states that pt can call to set up appointment.Pt's mother [name] states that pt;s hands are very swollen and wonders if he may need a diuretic.This can be called into [facility]

## 2015-07-27 NOTE — Telephone Encounter (Signed)
i would not prescribe a diuretic without knowing the cause of swelling. If this is on ongoing problem he should set up appointment to be evaluated.

## 2015-07-28 NOTE — Telephone Encounter (Signed)
LMOVM to return call.

## 2015-07-29 DIAGNOSIS — S79911A Unspecified injury of right hip, initial encounter: Secondary | ICD-10-CM | POA: Diagnosis not present

## 2015-07-29 DIAGNOSIS — M79661 Pain in right lower leg: Secondary | ICD-10-CM | POA: Diagnosis not present

## 2015-07-29 DIAGNOSIS — M79652 Pain in left thigh: Secondary | ICD-10-CM | POA: Diagnosis not present

## 2015-07-29 DIAGNOSIS — M79651 Pain in right thigh: Secondary | ICD-10-CM | POA: Diagnosis not present

## 2015-07-29 DIAGNOSIS — M25462 Effusion, left knee: Secondary | ICD-10-CM | POA: Diagnosis not present

## 2015-07-29 DIAGNOSIS — M79672 Pain in left foot: Secondary | ICD-10-CM | POA: Diagnosis not present

## 2015-07-29 DIAGNOSIS — M62838 Other muscle spasm: Secondary | ICD-10-CM | POA: Diagnosis not present

## 2015-07-29 DIAGNOSIS — M79671 Pain in right foot: Secondary | ICD-10-CM | POA: Diagnosis not present

## 2015-07-29 DIAGNOSIS — M25862 Other specified joint disorders, left knee: Secondary | ICD-10-CM | POA: Diagnosis not present

## 2015-07-29 DIAGNOSIS — M25861 Other specified joint disorders, right knee: Secondary | ICD-10-CM | POA: Diagnosis not present

## 2015-07-29 DIAGNOSIS — S79912A Unspecified injury of left hip, initial encounter: Secondary | ICD-10-CM | POA: Diagnosis not present

## 2015-07-29 DIAGNOSIS — M25461 Effusion, right knee: Secondary | ICD-10-CM | POA: Diagnosis not present

## 2015-07-29 DIAGNOSIS — M79662 Pain in left lower leg: Secondary | ICD-10-CM | POA: Diagnosis not present

## 2015-07-29 DIAGNOSIS — R109 Unspecified abdominal pain: Secondary | ICD-10-CM | POA: Diagnosis not present

## 2015-07-29 NOTE — Telephone Encounter (Signed)
Patient's mom Roanna RaiderSherri was notified. Sherri stated that she has taken him to [facility] ER for swelling.

## 2015-07-29 NOTE — Telephone Encounter (Signed)
Left message to return call if having problems. May use the Aspercreme with Lidocaine again as needed. Trying to get pain management referral scheduled. May need to consider a nerve block.

## 2015-07-30 DIAGNOSIS — R252 Cramp and spasm: Secondary | ICD-10-CM | POA: Insufficient documentation

## 2015-07-30 DIAGNOSIS — K592 Neurogenic bowel, not elsewhere classified: Secondary | ICD-10-CM | POA: Diagnosis not present

## 2015-07-30 DIAGNOSIS — M62838 Other muscle spasm: Secondary | ICD-10-CM | POA: Diagnosis not present

## 2015-07-30 DIAGNOSIS — M25551 Pain in right hip: Secondary | ICD-10-CM | POA: Diagnosis not present

## 2015-07-30 DIAGNOSIS — N319 Neuromuscular dysfunction of bladder, unspecified: Secondary | ICD-10-CM | POA: Diagnosis not present

## 2015-07-30 DIAGNOSIS — R600 Localized edema: Secondary | ICD-10-CM | POA: Diagnosis not present

## 2015-07-30 DIAGNOSIS — S24109S Unspecified injury at unspecified level of thoracic spinal cord, sequela: Secondary | ICD-10-CM | POA: Diagnosis not present

## 2015-07-30 DIAGNOSIS — M7989 Other specified soft tissue disorders: Secondary | ICD-10-CM | POA: Diagnosis not present

## 2015-07-30 DIAGNOSIS — N390 Urinary tract infection, site not specified: Secondary | ICD-10-CM | POA: Diagnosis not present

## 2015-07-30 DIAGNOSIS — R109 Unspecified abdominal pain: Secondary | ICD-10-CM | POA: Diagnosis not present

## 2015-07-31 DIAGNOSIS — M25451 Effusion, right hip: Secondary | ICD-10-CM | POA: Diagnosis not present

## 2015-07-31 DIAGNOSIS — N390 Urinary tract infection, site not specified: Secondary | ICD-10-CM | POA: Diagnosis not present

## 2015-07-31 DIAGNOSIS — K592 Neurogenic bowel, not elsewhere classified: Secondary | ICD-10-CM | POA: Diagnosis not present

## 2015-07-31 DIAGNOSIS — R252 Cramp and spasm: Secondary | ICD-10-CM | POA: Diagnosis not present

## 2015-07-31 DIAGNOSIS — R600 Localized edema: Secondary | ICD-10-CM | POA: Diagnosis not present

## 2015-07-31 DIAGNOSIS — R945 Abnormal results of liver function studies: Secondary | ICD-10-CM | POA: Diagnosis not present

## 2015-08-01 DIAGNOSIS — R252 Cramp and spasm: Secondary | ICD-10-CM | POA: Diagnosis not present

## 2015-08-01 DIAGNOSIS — K592 Neurogenic bowel, not elsewhere classified: Secondary | ICD-10-CM | POA: Diagnosis not present

## 2015-08-01 DIAGNOSIS — R600 Localized edema: Secondary | ICD-10-CM | POA: Diagnosis not present

## 2015-08-02 DIAGNOSIS — K592 Neurogenic bowel, not elsewhere classified: Secondary | ICD-10-CM | POA: Diagnosis not present

## 2015-08-02 DIAGNOSIS — R600 Localized edema: Secondary | ICD-10-CM | POA: Diagnosis not present

## 2015-08-02 DIAGNOSIS — R252 Cramp and spasm: Secondary | ICD-10-CM | POA: Diagnosis not present

## 2015-08-02 DIAGNOSIS — M25451 Effusion, right hip: Secondary | ICD-10-CM | POA: Diagnosis not present

## 2015-08-02 DIAGNOSIS — M25551 Pain in right hip: Secondary | ICD-10-CM | POA: Diagnosis not present

## 2015-08-03 DIAGNOSIS — G894 Chronic pain syndrome: Secondary | ICD-10-CM | POA: Diagnosis not present

## 2015-08-03 DIAGNOSIS — R252 Cramp and spasm: Secondary | ICD-10-CM | POA: Diagnosis not present

## 2015-08-03 DIAGNOSIS — Z0389 Encounter for observation for other suspected diseases and conditions ruled out: Secondary | ICD-10-CM | POA: Diagnosis not present

## 2015-08-03 DIAGNOSIS — K592 Neurogenic bowel, not elsewhere classified: Secondary | ICD-10-CM | POA: Diagnosis not present

## 2015-08-03 DIAGNOSIS — R600 Localized edema: Secondary | ICD-10-CM | POA: Diagnosis not present

## 2015-08-04 DIAGNOSIS — R252 Cramp and spasm: Secondary | ICD-10-CM | POA: Diagnosis not present

## 2015-08-04 DIAGNOSIS — K592 Neurogenic bowel, not elsewhere classified: Secondary | ICD-10-CM | POA: Diagnosis not present

## 2015-08-04 DIAGNOSIS — R600 Localized edema: Secondary | ICD-10-CM | POA: Diagnosis not present

## 2015-08-04 DIAGNOSIS — G894 Chronic pain syndrome: Secondary | ICD-10-CM | POA: Diagnosis not present

## 2015-08-05 DIAGNOSIS — N5089 Other specified disorders of the male genital organs: Secondary | ICD-10-CM | POA: Diagnosis not present

## 2015-08-05 DIAGNOSIS — R600 Localized edema: Secondary | ICD-10-CM | POA: Diagnosis not present

## 2015-08-05 DIAGNOSIS — I499 Cardiac arrhythmia, unspecified: Secondary | ICD-10-CM | POA: Diagnosis not present

## 2015-08-05 DIAGNOSIS — G894 Chronic pain syndrome: Secondary | ICD-10-CM | POA: Diagnosis not present

## 2015-08-05 DIAGNOSIS — R9431 Abnormal electrocardiogram [ECG] [EKG]: Secondary | ICD-10-CM | POA: Diagnosis not present

## 2015-08-05 DIAGNOSIS — N50819 Testicular pain, unspecified: Secondary | ICD-10-CM | POA: Diagnosis not present

## 2015-08-05 DIAGNOSIS — R252 Cramp and spasm: Secondary | ICD-10-CM | POA: Diagnosis not present

## 2015-08-05 DIAGNOSIS — K592 Neurogenic bowel, not elsewhere classified: Secondary | ICD-10-CM | POA: Diagnosis not present

## 2015-08-06 DIAGNOSIS — R945 Abnormal results of liver function studies: Secondary | ICD-10-CM | POA: Diagnosis not present

## 2015-08-06 DIAGNOSIS — R252 Cramp and spasm: Secondary | ICD-10-CM | POA: Diagnosis not present

## 2015-08-06 DIAGNOSIS — K592 Neurogenic bowel, not elsewhere classified: Secondary | ICD-10-CM | POA: Diagnosis not present

## 2015-08-06 DIAGNOSIS — R600 Localized edema: Secondary | ICD-10-CM | POA: Diagnosis not present

## 2015-08-06 DIAGNOSIS — N390 Urinary tract infection, site not specified: Secondary | ICD-10-CM | POA: Diagnosis not present

## 2015-08-07 DIAGNOSIS — R252 Cramp and spasm: Secondary | ICD-10-CM | POA: Diagnosis not present

## 2015-08-10 ENCOUNTER — Encounter: Payer: Self-pay | Admitting: Family Medicine

## 2015-08-10 ENCOUNTER — Ambulatory Visit (INDEPENDENT_AMBULATORY_CARE_PROVIDER_SITE_OTHER): Payer: Medicare Other | Admitting: Family Medicine

## 2015-08-10 VITALS — BP 94/58 | HR 62 | Temp 98.8°F | Resp 16

## 2015-08-10 DIAGNOSIS — K5909 Other constipation: Secondary | ICD-10-CM

## 2015-08-10 DIAGNOSIS — G894 Chronic pain syndrome: Secondary | ICD-10-CM

## 2015-08-10 DIAGNOSIS — R7401 Elevation of levels of liver transaminase levels: Secondary | ICD-10-CM | POA: Insufficient documentation

## 2015-08-10 DIAGNOSIS — F419 Anxiety disorder, unspecified: Secondary | ICD-10-CM

## 2015-08-10 DIAGNOSIS — R011 Cardiac murmur, unspecified: Secondary | ICD-10-CM | POA: Diagnosis not present

## 2015-08-10 DIAGNOSIS — R0600 Dyspnea, unspecified: Secondary | ICD-10-CM

## 2015-08-10 DIAGNOSIS — K5900 Constipation, unspecified: Secondary | ICD-10-CM | POA: Diagnosis not present

## 2015-08-10 DIAGNOSIS — R0609 Other forms of dyspnea: Secondary | ICD-10-CM

## 2015-08-10 DIAGNOSIS — R740 Nonspecific elevation of levels of transaminase and lactic acid dehydrogenase [LDH]: Secondary | ICD-10-CM

## 2015-08-10 DIAGNOSIS — G8220 Paraplegia, unspecified: Secondary | ICD-10-CM | POA: Diagnosis not present

## 2015-08-10 DIAGNOSIS — R609 Edema, unspecified: Secondary | ICD-10-CM | POA: Diagnosis not present

## 2015-08-10 MED ORDER — NAPROXEN 500 MG PO TABS: 500.0000 mg | ORAL_TABLET | Freq: Two times a day (BID) | ORAL | Status: DC

## 2015-08-10 MED ORDER — LUBIPROSTONE 24 MCG PO CAPS: 24.0000 ug | ORAL_CAPSULE | Freq: Two times a day (BID) | ORAL | Status: DC

## 2015-08-10 MED ORDER — METHADONE HCL 10 MG PO TABS: 30.0000 mg | ORAL_TABLET | Freq: Three times a day (TID) | ORAL | Status: DC

## 2015-08-10 MED ORDER — ALPRAZOLAM 2 MG PO TABS: 2.0000 mg | ORAL_TABLET | Freq: Four times a day (QID) | ORAL | Status: DC | PRN

## 2015-08-10 MED ORDER — MECLIZINE HCL 25 MG PO TABS: 25.0000 mg | ORAL_TABLET | ORAL | Status: DC | PRN

## 2015-08-10 MED ORDER — TIZANIDINE HCL 4 MG PO TABS
8.0000 mg | ORAL_TABLET | ORAL | Status: DC | PRN
Start: 2015-08-10 — End: 2015-11-30

## 2015-08-10 MED ORDER — DOCUSATE SODIUM 250 MG PO CAPS: 1.0000 | ORAL_CAPSULE | Freq: Four times a day (QID) | ORAL | Status: AC | PRN

## 2015-08-10 MED ORDER — PROMETHAZINE HCL 25 MG PO TABS: 25.0000 mg | ORAL_TABLET | ORAL | Status: DC | PRN

## 2015-08-10 MED ORDER — OXYCODONE HCL 15 MG PO TABS: 15.0000 mg | ORAL_TABLET | ORAL | Status: DC

## 2015-08-10 MED ORDER — POLYETHYLENE GLYCOL 3350 17 GM/SCOOP PO POWD: 17.0000 g | ORAL | Status: DC | PRN

## 2015-08-10 NOTE — Progress Notes (Signed)
Patient: Justin [name] Male    DOB: 1981-01-11   34 y.o.   MRN: [id number] Visit Date: 08/10/2015  Today's Provider: Mila Merry, MD   Chief Complaint  Patient presents with  . Hospitalization Follow-up   Subjective:    HPI   Follow up Hospitalization  Patient was admitted to [facility] on 07/29/2015 and discharged on 08/07/2015. He was treated for Muscle spasms of both lower extremities (Primary Dx); Chronic pain syndrome. Treatment for this included; labs , x-rays and put on pain pumps. He was discharged on tapering dose of dilaudid and increased dose of gabapentin He reports fair compliance with treatment. He reports this condition is Improved. He is noted to have elevated ALT. Had normal SPEP done. No further investigation of LFTs was done.    ----------------------------------------------------------------------   Anxiety He reports he continues to have significant anxiety. Has been on paroxetine for extended period of time, which he states works well, but still requires alprazolam a few times most days. He needs refill today.    Swelling He reports he continues to have some vague swelling in both ankles and feet. Has had no injury. No redness or injuries. Has notice this off and on over the last few months.    Dyspnea on exertion Has felt a little more short of breath on exertion than usual over the last couple of month. No chest pains or heart flutters.   Allergies  Allergen Reactions  . Azithromycin     throat closes  . Iodine   . Lorazepam     extremely hostile  . Meperidine   . Morphine Sulfate     Psychotic symptoms   Previous Medications   ALPRAZOLAM (XANAX) 2 MG TABLET    Take 1 tablet (2 mg total) by mouth every 6 (six) hours as needed.   AMPHETAMINE-DEXTROAMPHETAMINE (ADDERALL XR) 30 MG 24 HR CAPSULE    Take 1 capsule by mouth daily.   BACLOFEN (LIORESAL) 20 MG TABLET    Take 1 tablet by mouth every 4 (four) hours.   CLOTRIMAZOLE-BETAMETHASONE (LOTRISONE) CREAM    Apply 1 application topically 2 (two) times daily.   DANTROLENE (DANTRIUM) 25 MG CAPSULE    Take 1 capsule by mouth daily.   DIAZEPAM (VALIUM) 10 MG TABLET    Take 10 mg by mouth every 8 (eight) hours as needed.   DIPHENHYDRAMINE (BENADRYL) 25 MG TABLET    Take 1 tablet by mouth as needed.   DOCUSATE SODIUM (COLACE) 250 MG CAPSULE    Take 1 capsule by mouth 4 (four) times daily as needed.   DRONABINOL (MARINOL) 10 MG CAPSULE    Take 1 capsule by mouth every 6 (six) hours as needed.   GABAPENTIN (NEURONTIN) 800 MG TABLET    Take 1 tablet by mouth every 8 (eight) hours.   HYDROMORPHONE (DILAUDID) 2 MG TABLET    Take 23 tablets by mouth. Every 2-3 hours as needed   LIDOCAINE (LMX) 4 % CREAM    1 application 3 (three) times daily.   LUBIPROSTONE (AMITIZA) 24 MCG CAPSULE    Take 1 capsule (24 mcg total) by mouth 2 (two) times daily.   MECLIZINE (ANTIVERT) 25 MG TABLET    Take 1 tablet by mouth as needed.   METHADONE (DOLOPHINE) 10 MG TABLET    Take 3 tablets (30 mg total) by mouth every 8 (eight) hours.   MINERAL OIL OIL    daily.   NAPROXEN (NAPROSYN) 500 MG TABLET  Take 1 tablet by mouth 2 (two) times daily.   OXYCODONE (ROXICODONE) 15 MG IMMEDIATE RELEASE TABLET    Take 1 tablet (15 mg total) by mouth every 4 (four) hours.   PAROXETINE (PAXIL) 40 MG TABLET    Take 1 tablet (40 mg total) by mouth daily.   PHENAZOPYRIDINE (PYRIDIUM) 200 MG TABLET    Take 1 tablet by mouth 3 (three) times daily.   POLYETHYLENE GLYCOL POWDER (GLYCOLAX/MIRALAX) POWDER    Take 17 g by mouth as needed.   PROMETHAZINE (PHENERGAN) 25 MG TABLET    Take 1 tablet by mouth every 4 (four) hours as needed.   TIZANIDINE (ZANAFLEX) 4 MG TABLET    Take 2 tablets (8 mg total) by mouth every 4 (four) hours as needed.    Review of Systems  Respiratory: Positive for cough, shortness of breath and wheezing.   Cardiovascular: Positive for leg swelling.  Neurological: Positive for  headaches. Negative for dizziness and light-headedness.    Social History  Substance Use Topics  . Smoking status: Former Games developermoker  . Smokeless tobacco: Not on file     Comment: Using Nicorette Gum  . Alcohol Use: 0.0 oz/week    0 Standard drinks or equivalent per week     Comment: occasionaly   Objective:   BP 94/58 mmHg  Pulse 62  Temp(Src) 98.8 F (37.1 C) (Oral)  Resp 16  SpO2 95%  Physical Exam   General Appearance:    Alert, cooperative, no distress  Eyes:    PERRL, conjunctiva/corneas clear, EOM's intact       Lungs:     Clear to auscultation bilaterally, respirations unlabored  Heart:    Regular rate and rhythm, II/VI systolic murmur  Neurologic:   Awake, alert, oriented x 3. No apparent focal neurological           defect.           Assessment & Plan:     1. Lower paraplegia (HCC) Having increase spasticity. He needs refill of muscle relaxer to get by until he gets back in spinal injury clinic  - tiZANidine (ZANAFLEX) 4 MG tablet; Take 2 tablets (8 mg total) by mouth every 4 (four) hours as needed.  Dispense: 120 tablet; Refill: 5  2. Chronic pain associated with significant psychosocial dysfunction He is to continue to taper dilaudid - HYDROmorphone (DILAUDID) 2 MG tablet; Take 23 tablets by mouth. Every 2-3 hours as needed - oxyCODONE (ROXICODONE) 15 MG immediate release tablet; Take 1 tablet (15 mg total) by mouth every 4 (four) hours.  Dispense: 120 tablet; Refill: 0 - methadone (DOLOPHINE) 10 MG tablet; Take 3 tablets (30 mg total) by mouth every 8 (eight) hours.  Dispense: 270 tablet; Refill: 0  3. Chronic constipation States that Amitiza remains effective and needs refill.  - lubiprostone (AMITIZA) 24 MCG capsule; Take 1 capsule (24 mcg total) by mouth 2 (two) times daily.  Dispense: 60 capsule; Refill: 12  4. Anxiety Needs refill alprazolam - alprazolam (XANAX) 2 MG tablet; Take 1 tablet (2 mg total) by mouth every 6 (six) hours as needed.  Dispense:  120 tablet; Refill: 5  5. Edema, unspecified type  - T4 AND TSH - Brain natriuretic peptide - Echocardiogram; Future  6. Dyspnea on exertion  - Brain natriuretic peptide - Echocardiogram; Future  7. Heart murmur   8. Elevated transaminase level  - Hepatic function panel - Hepatitis B core antibody, total - Hepatitis C Antibody   tympanic membrane  Lelon Huh, MD  [facility]

## 2015-08-11 LAB — HEPATIC FUNCTION PANEL
ALBUMIN: 4.1 g/dL (ref 3.5–5.5)
ALT: 63 IU/L — ABNORMAL HIGH (ref 0–44)
AST: 34 IU/L (ref 0–40)
Alkaline Phosphatase: 80 IU/L (ref 39–117)
Bilirubin Total: 0.3 mg/dL (ref 0.0–1.2)
Bilirubin, Direct: 0.14 mg/dL (ref 0.00–0.40)
TOTAL PROTEIN: 6.5 g/dL (ref 6.0–8.5)

## 2015-08-11 LAB — BRAIN NATRIURETIC PEPTIDE: BNP: 50.6 pg/mL (ref 0.0–100.0)

## 2015-08-11 LAB — T4 AND TSH
T4, Total: 5.9 ug/dL (ref 4.5–12.0)
TSH: 6.45 u[IU]/mL — ABNORMAL HIGH (ref 0.450–4.500)

## 2015-08-11 LAB — HEPATITIS B CORE ANTIBODY, TOTAL: HEP B C TOTAL AB: NEGATIVE

## 2015-08-15 ENCOUNTER — Telehealth: Payer: Self-pay | Admitting: *Deleted

## 2015-08-15 MED ORDER — BACLOFEN 20 MG PO TABS: 20.0000 mg | ORAL_TABLET | ORAL | Status: DC

## 2015-08-15 NOTE — Telephone Encounter (Signed)
Received fax requesting Baclofen 10 mg x1 tablet tid.

## 2015-08-17 ENCOUNTER — Ambulatory Visit: Payer: Medicare Other | Admitting: Family Medicine

## 2015-08-19 ENCOUNTER — Telehealth: Payer: Self-pay | Admitting: *Deleted

## 2015-08-19 MED ORDER — LEVOTHYROXINE SODIUM 50 MCG PO TABS: 50.0000 ug | ORAL_TABLET | Freq: Every day | ORAL | Status: DC

## 2015-08-19 NOTE — Telephone Encounter (Signed)
Patient notified of results. Patient expressed understanding. Rx was sent to pharmacy. 

## 2015-08-19 NOTE — Telephone Encounter (Signed)
-----   Message from Malva Limesonald E Fisher, MD sent at 08/15/2015  9:39 PM EST ----- Labs show he is slightly hyPOthyroid, which could contribute to swelling. Slight elevation of liver functions. Otherwise normal. Recommend he start levothyroxine 50mcg daily, #30, rf x 1. Follow up o.v. And recheck thyroid functions in 1 month.

## 2015-08-24 ENCOUNTER — Ambulatory Visit: Payer: Medicare Other

## 2015-08-25 DIAGNOSIS — S24109A Unspecified injury at unspecified level of thoracic spinal cord, initial encounter: Secondary | ICD-10-CM | POA: Diagnosis not present

## 2015-08-25 DIAGNOSIS — M62838 Other muscle spasm: Secondary | ICD-10-CM | POA: Diagnosis not present

## 2015-08-31 ENCOUNTER — Ambulatory Visit: Admission: RE | Admit: 2015-08-31 | Payer: Medicare Other | Source: Ambulatory Visit

## 2015-09-06 ENCOUNTER — Other Ambulatory Visit: Payer: Self-pay | Admitting: Family Medicine

## 2015-09-06 ENCOUNTER — Ambulatory Visit: Payer: Medicare Other

## 2015-09-06 DIAGNOSIS — G894 Chronic pain syndrome: Secondary | ICD-10-CM

## 2015-09-06 MED ORDER — METHADONE HCL 10 MG PO TABS: 30.0000 mg | ORAL_TABLET | Freq: Three times a day (TID) | ORAL | Status: DC

## 2015-09-06 NOTE — Telephone Encounter (Signed)
Pt is requesting a 60 day supply of Methadone 10mg /MW

## 2015-09-06 NOTE — Telephone Encounter (Signed)
Needs ok for early refill because of the christmas holiday.  methadone (DOLOPHINE) 10 MG tablet tiZANidine (ZANAFLEX) 4 MG tablet lubiprostone (AMITIZA) 24 MCG capsule baclofen (LIORESAL) 20 MG tablet alprazolam (XANAX) 2 MG tablet   Pts callback is  [phone]  Thanks Barth Kirkseri

## 2015-09-06 NOTE — Telephone Encounter (Signed)
Patient has refills at pharmacy for all the medications requested, except methadone. Patient is requesting 60 day supply for methadone.

## 2015-09-07 ENCOUNTER — Other Ambulatory Visit: Payer: Self-pay | Admitting: Family Medicine

## 2015-09-07 ENCOUNTER — Telehealth: Payer: Self-pay | Admitting: *Deleted

## 2015-09-07 ENCOUNTER — Other Ambulatory Visit: Payer: Self-pay

## 2015-09-07 DIAGNOSIS — G894 Chronic pain syndrome: Secondary | ICD-10-CM

## 2015-09-07 DIAGNOSIS — N390 Urinary tract infection, site not specified: Secondary | ICD-10-CM

## 2015-09-07 MED ORDER — OXYCODONE HCL 15 MG PO TABS: 15.0000 mg | ORAL_TABLET | ORAL | Status: DC

## 2015-09-07 NOTE — Telephone Encounter (Signed)
Pt called to see if his RXs were ready to be picked up. I advised that I had called to let him know the Methadone was ready. Pt stated that he requested the oxyCODONE (ROXICODONE) 15 MG immediate release tablet to be filled when he requested the other medications. Pt stated he has enough to last until Christmas. Thanks TNP

## 2015-09-08 NOTE — Telephone Encounter (Signed)
Error

## 2015-09-10 LAB — URINE CULTURE: Organism ID, Bacteria: NO GROWTH

## 2015-09-27 ENCOUNTER — Other Ambulatory Visit: Payer: Self-pay

## 2015-09-27 ENCOUNTER — Other Ambulatory Visit: Payer: Self-pay | Admitting: Family Medicine

## 2015-09-27 DIAGNOSIS — N390 Urinary tract infection, site not specified: Secondary | ICD-10-CM | POA: Diagnosis not present

## 2015-09-27 NOTE — Progress Notes (Signed)
Patient brought in urine specimen into the office. He reports that he is beginning to have symptoms of UTI. Urine cx ordered. F/U pending results.

## 2015-09-28 DIAGNOSIS — G894 Chronic pain syndrome: Secondary | ICD-10-CM | POA: Diagnosis not present

## 2015-09-28 DIAGNOSIS — M62838 Other muscle spasm: Secondary | ICD-10-CM | POA: Diagnosis not present

## 2015-09-28 DIAGNOSIS — G8222 Paraplegia, incomplete: Secondary | ICD-10-CM | POA: Diagnosis not present

## 2015-09-30 LAB — URINE CULTURE

## 2015-10-03 ENCOUNTER — Telehealth: Payer: Self-pay

## 2015-10-03 NOTE — Telephone Encounter (Signed)
Pt is returning call.  CB#[phone] or [phone]/MW

## 2015-10-03 NOTE — Telephone Encounter (Signed)
-----   Message from Malva Limesonald E Fisher, MD sent at 10/01/2015  8:04 AM EST ----- Culture shows infection that is resistant to all oral antibiotics. He needs to see his urologist ASAP. If he is having symptoms of a urinary tract infection he needs to come in for a shot of Rocephin

## 2015-10-03 NOTE — Telephone Encounter (Signed)
LMTCB

## 2015-10-03 NOTE — Telephone Encounter (Signed)
Patient was notified of results. Patient expressed understanding. 

## 2015-10-03 NOTE — Telephone Encounter (Signed)
Pt calling back about results.  His call back is [phone]  Thanks teri

## 2015-10-04 ENCOUNTER — Telehealth: Payer: Self-pay

## 2015-10-04 MED ORDER — FOSFOMYCIN TROMETHAMINE 3 G PO PACK: PACK | ORAL | Status: DC

## 2015-10-04 NOTE — Telephone Encounter (Signed)
Advised mother as below.  

## 2015-10-04 NOTE — Telephone Encounter (Signed)
Dr. Jone Baseman office called saying that the patient's mother has called requesting that patient be treated for UTI. She reports that they gave him IV abx back in 03/2015, but they recommend that patient try Fosfomycin 3GM every other day X 3 doses. She reports that it is a powder that he drinks along with 3-4oz of water. It should come with 3 packs. She also mentions that in the future we can add fosfomycin to the urine cx to see if it shows any resistance.

## 2015-10-04 NOTE — Telephone Encounter (Signed)
Rx has been sent to [facility].

## 2015-10-26 ENCOUNTER — Ambulatory Visit (INDEPENDENT_AMBULATORY_CARE_PROVIDER_SITE_OTHER): Payer: Medicare Other | Admitting: Family Medicine

## 2015-10-26 ENCOUNTER — Encounter: Payer: Self-pay | Admitting: Family Medicine

## 2015-10-26 VITALS — BP 114/70 | HR 64 | Temp 98.7°F | Resp 16

## 2015-10-26 DIAGNOSIS — R490 Dysphonia: Secondary | ICD-10-CM | POA: Insufficient documentation

## 2015-10-26 DIAGNOSIS — E038 Other specified hypothyroidism: Secondary | ICD-10-CM | POA: Diagnosis not present

## 2015-10-26 DIAGNOSIS — R252 Cramp and spasm: Secondary | ICD-10-CM

## 2015-10-26 DIAGNOSIS — G8220 Paraplegia, unspecified: Secondary | ICD-10-CM | POA: Diagnosis not present

## 2015-10-26 DIAGNOSIS — B356 Tinea cruris: Secondary | ICD-10-CM

## 2015-10-26 DIAGNOSIS — G894 Chronic pain syndrome: Secondary | ICD-10-CM

## 2015-10-26 DIAGNOSIS — E039 Hypothyroidism, unspecified: Secondary | ICD-10-CM | POA: Insufficient documentation

## 2015-10-26 MED ORDER — LEVOTHYROXINE SODIUM 50 MCG PO TABS: 50.0000 ug | ORAL_TABLET | Freq: Every day | ORAL | Status: DC

## 2015-10-26 MED ORDER — OMEPRAZOLE 20 MG PO CPDR: 20.0000 mg | DELAYED_RELEASE_CAPSULE | Freq: Every day | ORAL | Status: DC

## 2015-10-26 NOTE — Patient Instructions (Signed)
Call for lab order to check thyroid functions the first week of March If hoarseness is not better 2 weeks after starting omeprazole, then call for referral to ENT specialist

## 2015-10-26 NOTE — Progress Notes (Signed)
Patient: Justin [name] Male    DOB: [birth year]   35 y.o.   MRN: [id number] Visit Date: 10/26/2015  Today's Provider: Mila Merry, MD   Chief Complaint  Patient presents with  . Leg Swelling   Subjective:    HPI  Follow up hypothyroid:  Lab Results  Component Value Date   TSH 6.450* 08/10/2015   After last visit he was started on levothyroxine. He states the swelling resolved and leg pain and spasms greatly improved while taken medication, but flared back up after medication ran out a weeks ago  Hoarseness.  He states his voice has been persistently hoarse for the last month. Has had no nasal congestion and has occasional drainage. Denies having heartburn. No fevers, chills, sweats, or cough.   Rash  States he has rash in bilateral inguinal regions for the last couple of weeks which is very itchy.   Follow up heart murmur He was scheduled for echocardiogram in November, but was unable to keep appointment. He requests that this be reschedule.   Follow up fracture We had intended for him to have BMD done due to increased risk for osteoporosis secondary to chronic opioid use.   Chronic pain He states pain had improved quite a  Bit when he was taking thyroid medications and tolerating medications well. It has gotten much worse the last week. He is hoping this will improve again after restarting thyroid medications.      Allergies  Allergen Reactions  . Azithromycin     throat closes  . Iodine   . Lorazepam     extremely hostile  . Meperidine   . Morphine Sulfate     Psychotic symptoms   Previous Medications   ALPRAZOLAM (XANAX) 2 MG TABLET    Take 1 tablet (2 mg total) by mouth every 6 (six) hours as needed.   AMPHETAMINE-DEXTROAMPHETAMINE (ADDERALL XR) 30 MG 24 HR CAPSULE    Take 1 capsule by mouth daily.   BACLOFEN (LIORESAL) 20 MG TABLET    Take 1 tablet (20 mg total) by mouth every 4 (four) hours.   CLOTRIMAZOLE-BETAMETHASONE (LOTRISONE)  CREAM    Apply 1 application topically 2 (two) times daily.   DANTROLENE (DANTRIUM) 25 MG CAPSULE    Take 1 capsule by mouth daily. Reported on 10/26/2015   DIPHENHYDRAMINE (BENADRYL) 25 MG TABLET    Take 1 tablet by mouth as needed.   DOCUSATE SODIUM (COLACE) 250 MG CAPSULE    Take 1 capsule (250 mg total) by mouth 4 (four) times daily as needed.   DRONABINOL (MARINOL) 10 MG CAPSULE    Take 1 capsule by mouth every 6 (six) hours as needed.   GABAPENTIN (NEURONTIN) 800 MG TABLET    Take 1 tablet by mouth every 8 (eight) hours.   HYDROMORPHONE (DILAUDID) 2 MG TABLET    Take 23 tablets by mouth. Every 2-3 hours as needed   LEVOTHYROXINE (SYNTHROID, LEVOTHROID) 50 MCG TABLET    Take 1 tablet (50 mcg total) by mouth daily before breakfast.   LIDOCAINE (LMX) 4 % CREAM    1 application 3 (three) times daily.   LUBIPROSTONE (AMITIZA) 24 MCG CAPSULE    Take 1 capsule (24 mcg total) by mouth 2 (two) times daily.   MECLIZINE (ANTIVERT) 25 MG TABLET    Take 1 tablet (25 mg total) by mouth as needed.   METHADONE (DOLOPHINE) 10 MG TABLET    Take 3 tablets (30 mg total) by mouth  every 8 (eight) hours.   MINERAL OIL OIL    daily.   NAPROXEN (NAPROSYN) 500 MG TABLET    Take 1 tablet (500 mg total) by mouth 2 (two) times daily.   OXYCODONE (ROXICODONE) 15 MG IMMEDIATE RELEASE TABLET    Take 1 tablet (15 mg total) by mouth every 4 (four) hours.   PAROXETINE (PAXIL) 40 MG TABLET    Take 1 tablet (40 mg total) by mouth daily.   PHENAZOPYRIDINE (PYRIDIUM) 200 MG TABLET    Take 1 tablet by mouth 3 (three) times daily.   POLYETHYLENE GLYCOL POWDER (GLYCOLAX/MIRALAX) POWDER    Take 17 g by mouth as needed.   PROMETHAZINE (PHENERGAN) 25 MG TABLET    Take 1 tablet (25 mg total) by mouth every 4 (four) hours as needed.   TIZANIDINE (ZANAFLEX) 4 MG TABLET    Take 2 tablets (8 mg total) by mouth every 4 (four) hours as needed.    Review of Systems  Constitutional: Negative for fever, chills and appetite change.  HENT:  Positive for voice change.        Voice Hoarse and scratchy   Respiratory: Negative for chest tightness, shortness of breath and wheezing.   Cardiovascular: Positive for leg swelling. Negative for chest pain and palpitations.  Gastrointestinal: Negative for nausea, vomiting and abdominal pain.    Social History  Substance Use Topics  . Smoking status: Former Games developer  . Smokeless tobacco: Not on file     Comment: Using Nicorette Gum  . Alcohol Use: 0.0 oz/week    0 Standard drinks or equivalent per week     Comment: occasionaly   Objective:   BP 114/70 mmHg  Pulse 64  Temp(Src) 98.7 F (37.1 C) (Oral)  Resp 16  SpO2 94%  Physical Exam  General appearance: alert, well developed, well nourished, cooperative and in no distress Head: Normocephalic, without obvious abnormality, atraumatic Lungs: Respirations even and unlabored Extremities: No gross deformities Skin: Red scaly rash bilateral inguinal regions with a few satellite lesions.  Psych: Appropriate mood and affect. Neurologic: Mental status: Alert, oriented to person, place, and time, thought content appropriate.     Assessment & Plan:     1. Chronic hoarseness No sign or uri and chronic sinus drainage. Will try PPI for 2 weeks for possible GERD. If not effective he is to call for referral to ENT  2. Spasm   3. Chronic pain associated with significant psychosocial dysfunction   4. Lower paraplegia (HCC)   5. . Tinea cruris He has rx at home for Lotrisone and will start using this as previously prescribed. Call if not effective.   6. Hypothyroid Felt much better before he ran out. Will restart, new rx sent to pharmacy.   7. Follow up fracture Will reschedule BMD  8. Heart murmur Will reschedule echocardiogram.   Addressed extensive list of chronic and acute medical problems today requiring extensive time in counseling and coordination care.  Over half of this 45 minute visit were spent in counseling and  coordinating care of multiple medical problems.       Mila Merry, MD  [facility]

## 2015-10-27 ENCOUNTER — Telehealth: Payer: Self-pay

## 2015-10-27 NOTE — Telephone Encounter (Signed)
Patient called and states he received a phone call but there was no message. Did someone call? i dont see a message. If you did call and he does not answer when calling back he asked we a message with detail information be left on his voicemail. Please -aa

## 2015-10-27 NOTE — Telephone Encounter (Signed)
Informed pt that there is no record of anyone trying to contact him.

## 2015-10-31 ENCOUNTER — Other Ambulatory Visit: Payer: Self-pay | Admitting: Family Medicine

## 2015-10-31 DIAGNOSIS — G894 Chronic pain syndrome: Secondary | ICD-10-CM

## 2015-10-31 NOTE — Telephone Encounter (Signed)
Pt contacted office for refill request on the following medications: 1. methadone (DOLOPHINE) 10 MG tablet  2. xyCODONE (ROXICODONE) 15 MG immediate release tablet   Pt stated that he contacted pharmacy and was advised that he doesn't have any more RX for these medications on file. Pt is requesting new RX to be written. Please advise. Thanks TNP

## 2015-11-01 MED ORDER — OXYCODONE HCL 15 MG PO TABS: 15.0000 mg | ORAL_TABLET | ORAL | Status: DC

## 2015-11-01 MED ORDER — METHADONE HCL 10 MG PO TABS: 30.0000 mg | ORAL_TABLET | Freq: Three times a day (TID) | ORAL | Status: DC

## 2015-11-01 NOTE — Telephone Encounter (Signed)
Pt called b/c he said he had a missed call from our office and thought it might have been about the RX request. Pt stated that when he gets his medications it is usually for 3 months but the last time he got the RX it was only for 2 months. Thanks TNP

## 2015-11-03 ENCOUNTER — Ambulatory Visit: Payer: Medicare Other

## 2015-11-08 ENCOUNTER — Ambulatory Visit: Payer: Medicare Other | Attending: Family Medicine

## 2015-11-11 ENCOUNTER — Other Ambulatory Visit: Payer: Self-pay | Admitting: Family Medicine

## 2015-11-11 ENCOUNTER — Other Ambulatory Visit: Payer: Self-pay

## 2015-11-11 DIAGNOSIS — N390 Urinary tract infection, site not specified: Secondary | ICD-10-CM | POA: Diagnosis not present

## 2015-11-11 NOTE — Progress Notes (Signed)
Patient dropped off another urine specimen. Advised patient that Dr. [name] is not in the office. There was no order placed in chart. Patient reports that Dr. [name] checks urine and patient may bring it to be checked. Patient is experiencing symptoms of UTI. Urine was sent for culture.

## 2015-11-15 ENCOUNTER — Telehealth: Payer: Self-pay | Admitting: Family Medicine

## 2015-11-15 ENCOUNTER — Other Ambulatory Visit: Payer: Self-pay | Admitting: Family Medicine

## 2015-11-15 LAB — URINE CULTURE

## 2015-11-15 NOTE — Telephone Encounter (Signed)
Pt called wanting to know if we have UA back .  He said his mom dropped off last week.  Please advise.

## 2015-11-15 NOTE — Telephone Encounter (Signed)
Pt called wanting results from the Urine Culture his mom brought in last week.  Please call back [phone]  Thanks Barth Kirks

## 2015-11-15 NOTE — Telephone Encounter (Signed)
Please advise results? 

## 2015-11-16 ENCOUNTER — Telehealth: Payer: Self-pay

## 2015-11-16 NOTE — Telephone Encounter (Signed)
-----   Message from Malva Limes, MD sent at 11/15/2015  4:58 PM EST ----- Cultures grew two types of bacteria, but the lab did not identify the type of bacteria. He really needs to follow up with urology for this, he is going to end up being resistant to every antibiotic that exists.

## 2015-11-16 NOTE — Telephone Encounter (Signed)
Patient advised as directed below. Patient requested lab results be faxed to [name] and Dr. Sampson Goon.   Patient states once Dr. Sampson Goon receives labs he will advise patient of which antibiotic he should use for treatment.   Lab results faxed.

## 2015-11-17 ENCOUNTER — Telehealth: Payer: Self-pay | Admitting: Family Medicine

## 2015-11-17 NOTE — Telephone Encounter (Signed)
Called and spoke with Justin [name] from [name] office (Infectious disease). Justin [name] states that patient called their office requesting that [name] run a test to identify the bacteria in his urine.  I advised her that patient was told to follow up with Urology. She agreed that patient should follow up with urology. Justin [name] says that she would call patient and re state that to him. Before I could complete Documentation of this note, patient calls here wanting to know what type of bacteria it was? I advised patient that the lab was not able to Identify it. He states the reason he called [name] was because he thought Infectious disease could do more testing in their lab to find out what type of bacteria it was. Patient states his Urologist is at [facility]  ([name]) and it takes him a month to get in with them for an appointment. I asked patient when his appointment was with his Urologist and he says he has not yet scheduled an appointment. Patient requested that I fax a copy of the report to both [name] ( fax # [phone]) and [name] [phone]). Copy of lab report faxed.

## 2015-11-17 NOTE — Telephone Encounter (Signed)
Brandy @ [facility] Dr. Sampson Goon office States she has a question about lab that pt. Is requesting to have done in there office. Please return her call @ [phone]. Thanks CC

## 2015-11-30 ENCOUNTER — Encounter: Payer: Self-pay | Admitting: Family Medicine

## 2015-11-30 ENCOUNTER — Ambulatory Visit (INDEPENDENT_AMBULATORY_CARE_PROVIDER_SITE_OTHER): Payer: Medicare Other | Admitting: Family Medicine

## 2015-11-30 VITALS — BP 92/60 | HR 107 | Temp 98.7°F | Resp 16

## 2015-11-30 DIAGNOSIS — L309 Dermatitis, unspecified: Secondary | ICD-10-CM | POA: Diagnosis not present

## 2015-11-30 DIAGNOSIS — N390 Urinary tract infection, site not specified: Secondary | ICD-10-CM

## 2015-11-30 DIAGNOSIS — Z1630 Resistance to unspecified antimicrobial drugs: Secondary | ICD-10-CM | POA: Diagnosis not present

## 2015-11-30 DIAGNOSIS — G894 Chronic pain syndrome: Secondary | ICD-10-CM | POA: Diagnosis not present

## 2015-11-30 DIAGNOSIS — F329 Major depressive disorder, single episode, unspecified: Secondary | ICD-10-CM

## 2015-11-30 DIAGNOSIS — F32A Depression, unspecified: Secondary | ICD-10-CM | POA: Insufficient documentation

## 2015-11-30 DIAGNOSIS — E039 Hypothyroidism, unspecified: Secondary | ICD-10-CM

## 2015-11-30 DIAGNOSIS — G8220 Paraplegia, unspecified: Secondary | ICD-10-CM

## 2015-11-30 MED ORDER — LIDOCAINE 4 % EX CREA: 1.0000 "application " | TOPICAL_CREAM | Freq: Three times a day (TID) | CUTANEOUS | Status: DC

## 2015-11-30 MED ORDER — TIZANIDINE HCL 4 MG PO TABS: 8.0000 mg | ORAL_TABLET | Freq: Four times a day (QID) | ORAL | Status: DC

## 2015-11-30 MED ORDER — DRONABINOL 10 MG PO CAPS: 10.0000 mg | ORAL_CAPSULE | Freq: Four times a day (QID) | ORAL | Status: DC | PRN

## 2015-11-30 MED ORDER — PAROXETINE HCL 30 MG PO TABS: 60.0000 mg | ORAL_TABLET | Freq: Every day | ORAL | Status: DC

## 2015-11-30 MED ORDER — BACLOFEN 20 MG PO TABS: 20.0000 mg | ORAL_TABLET | Freq: Four times a day (QID) | ORAL | Status: DC

## 2015-11-30 MED ORDER — GABAPENTIN 800 MG PO TABS: 800.0000 mg | ORAL_TABLET | Freq: Four times a day (QID) | ORAL | Status: DC

## 2015-11-30 MED ORDER — CLOTRIMAZOLE-BETAMETHASONE 1-0.05 % EX CREA: 1.0000 "application " | TOPICAL_CREAM | Freq: Two times a day (BID) | CUTANEOUS | Status: DC
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
## 2015-12-01 ENCOUNTER — Ambulatory Visit: Payer: Medicare Other

## 2015-12-05 ENCOUNTER — Other Ambulatory Visit
Admission: RE | Admit: 2015-12-05 | Discharge: 2015-12-05 | Disposition: A | Discharge status: Home / Self Care | Payer: Medicare Other | Source: Ambulatory Visit | Attending: Family Medicine | Admitting: Family Medicine

## 2015-12-05 ENCOUNTER — Ambulatory Visit
Admission: RE | Admit: 2015-12-05 | Discharge: 2015-12-05 | Disposition: A | Discharge status: Home / Self Care | Payer: Medicare Other | Source: Ambulatory Visit | Attending: Family Medicine | Admitting: Family Medicine

## 2015-12-05 DIAGNOSIS — E039 Hypothyroidism, unspecified: Secondary | ICD-10-CM | POA: Diagnosis not present

## 2015-12-05 DIAGNOSIS — M85832 Other specified disorders of bone density and structure, left forearm: Secondary | ICD-10-CM | POA: Diagnosis not present

## 2015-12-05 DIAGNOSIS — M85852 Other specified disorders of bone density and structure, left thigh: Secondary | ICD-10-CM | POA: Diagnosis not present

## 2015-12-05 DIAGNOSIS — M8588 Other specified disorders of bone density and structure, other site: Secondary | ICD-10-CM | POA: Diagnosis not present

## 2015-12-05 DIAGNOSIS — M8440XA Pathological fracture, unspecified site, initial encounter for fracture: Secondary | ICD-10-CM | POA: Insufficient documentation

## 2015-12-05 LAB — TSH: TSH: 5.836 u[IU]/mL — ABNORMAL HIGH (ref 0.350–4.500)

## 2015-12-06 ENCOUNTER — Telehealth: Payer: Self-pay

## 2015-12-06 DIAGNOSIS — E039 Hypothyroidism, unspecified: Secondary | ICD-10-CM

## 2015-12-06 LAB — T4: T4 TOTAL: 8.2 ug/dL (ref 4.5–12.0)

## 2015-12-06 MED ORDER — LEVOTHYROXINE SODIUM 75 MCG PO TABS: 75.0000 ug | ORAL_TABLET | Freq: Every day | ORAL | Status: DC

## 2015-12-06 NOTE — Telephone Encounter (Signed)
-----   Message from Malva Limesonald E Fisher, MD sent at 12/06/2015  8:10 AM EDT ----- Is still a little hyPOthyroid, but better. Need to increase levothyroxine to 75mcg daily #30, rf x 3 and follow up in 3 months.

## 2015-12-06 NOTE — Telephone Encounter (Signed)
Advised patient as below. Medication sent into patient's pharmacy.  

## 2015-12-07 ENCOUNTER — Ambulatory Visit (INDEPENDENT_AMBULATORY_CARE_PROVIDER_SITE_OTHER): Payer: Medicare Other | Admitting: Family Medicine

## 2015-12-07 ENCOUNTER — Ambulatory Visit
Admission: RE | Admit: 2015-12-07 | Discharge: 2015-12-07 | Disposition: A | Discharge status: Home / Self Care | Payer: Medicare Other | Source: Ambulatory Visit | Attending: Family Medicine | Admitting: Family Medicine

## 2015-12-07 ENCOUNTER — Encounter: Payer: Self-pay | Admitting: Family Medicine

## 2015-12-07 VITALS — BP 110/64 | HR 98 | Temp 97.5°F | Resp 16

## 2015-12-07 DIAGNOSIS — L8990 Pressure ulcer of unspecified site, unspecified stage: Secondary | ICD-10-CM

## 2015-12-07 DIAGNOSIS — R0609 Other forms of dyspnea: Secondary | ICD-10-CM

## 2015-12-07 DIAGNOSIS — R399 Unspecified symptoms and signs involving the genitourinary system: Secondary | ICD-10-CM | POA: Diagnosis not present

## 2015-12-07 DIAGNOSIS — R011 Cardiac murmur, unspecified: Secondary | ICD-10-CM | POA: Insufficient documentation

## 2015-12-07 DIAGNOSIS — R0600 Dyspnea, unspecified: Secondary | ICD-10-CM

## 2015-12-07 DIAGNOSIS — R609 Edema, unspecified: Secondary | ICD-10-CM

## 2015-12-07 MED ORDER — SILVER SULFADIAZINE 1 % EX CREA: 1.0000 "application " | TOPICAL_CREAM | Freq: Every day | CUTANEOUS | Status: DC

## 2015-12-07 NOTE — Progress Notes (Signed)
Patient: Justin [name] Male    DOB: Oct 02, 1980   35 y.o.   MRN: [id number] Visit Date: 12/07/2015  Today's Provider: Mila Merry, MD   Chief Complaint  Patient presents with  . Wound Check   Subjective:    HPI  Wound check: Patient reports that 1 week ago he was trying on clothes in the [employer] dressing room. He reports while changing clothes his bottom rubbed against the changing table causing the skin to rub off. Patient now has a fleshy colored wound on the right side of his bottom.      Allergies  Allergen Reactions  . Azithromycin     throat closes  . Iodine   . Lorazepam     extremely hostile  . Meperidine   . Morphine Sulfate     Psychotic symptoms   Previous Medications   ALPRAZOLAM (XANAX) 2 MG TABLET    Take 1 tablet (2 mg total) by mouth every 6 (six) hours as needed.   AMPHETAMINE-DEXTROAMPHETAMINE (ADDERALL XR) 30 MG 24 HR CAPSULE    Take 1 capsule by mouth daily.   BACLOFEN (LIORESAL) 20 MG TABLET    Take 1 tablet (20 mg total) by mouth 4 (four) times daily.   CLOTRIMAZOLE-BETAMETHASONE (LOTRISONE) CREAM    Apply 1 application topically 2 (two) times daily.   DANTROLENE (DANTRIUM) 25 MG CAPSULE    Take 1 capsule by mouth daily. Reported on 10/26/2015   DIPHENHYDRAMINE (BENADRYL) 25 MG TABLET    Take 1 tablet by mouth as needed.   DOCUSATE SODIUM (COLACE) 250 MG CAPSULE    Take 1 capsule (250 mg total) by mouth 4 (four) times daily as needed.   DRONABINOL (MARINOL) 10 MG CAPSULE    Take 1 capsule (10 mg total) by mouth every 6 (six) hours as needed.   GABAPENTIN (NEURONTIN) 800 MG TABLET    Take 1 tablet (800 mg total) by mouth 4 (four) times daily.   LEVOTHYROXINE (SYNTHROID, LEVOTHROID) 75 MCG TABLET    Take 1 tablet (75 mcg total) by mouth daily before breakfast.   LIDOCAINE (LMX) 4 % CREAM    Apply 1 application topically 3 (three) times daily.   LUBIPROSTONE (AMITIZA) 24 MCG CAPSULE    Take 1 capsule (24 mcg total) by mouth 2 (two) times  daily.   MECLIZINE (ANTIVERT) 25 MG TABLET    Take 1 tablet (25 mg total) by mouth as needed.   METHADONE (DOLOPHINE) 10 MG TABLET    Take 3 tablets (30 mg total) by mouth every 8 (eight) hours.   MINERAL OIL OIL    daily.   NAPROXEN (NAPROSYN) 500 MG TABLET    Take 1 tablet (500 mg total) by mouth 2 (two) times daily.   OMEPRAZOLE (PRILOSEC) 20 MG CAPSULE    Take 1 capsule (20 mg total) by mouth daily.   OXYCODONE (ROXICODONE) 15 MG IMMEDIATE RELEASE TABLET    Take 1 tablet (15 mg total) by mouth every 4 (four) hours.   PAROXETINE (PAXIL) 30 MG TABLET    Take 2 tablets (60 mg total) by mouth daily.   PHENAZOPYRIDINE (PYRIDIUM) 200 MG TABLET    Take 1 tablet by mouth 3 (three) times daily.   POLYETHYLENE GLYCOL POWDER (GLYCOLAX/MIRALAX) POWDER    Take 17 g by mouth as needed.   PROMETHAZINE (PHENERGAN) 25 MG TABLET    Take 1 tablet (25 mg total) by mouth every 4 (four) hours as needed.   TIZANIDINE (  ZANAFLEX) 4 MG TABLET    Take 2 tablets (8 mg total) by mouth 4 (four) times daily.    Review of Systems  Constitutional: Negative for fever, chills and appetite change.  Respiratory: Negative for chest tightness, shortness of breath and wheezing.   Cardiovascular: Negative for chest pain and palpitations.  Gastrointestinal: Negative for nausea, vomiting and abdominal pain.  Skin: Positive for wound (right side of bottom).  Psychiatric/Behavioral: The patient is hyperactive (talks excessively and has lots of energy/ changes subjects quickly).     Social History  Substance Use Topics  . Smoking status: Former Games developermoker  . Smokeless tobacco: Not on file     Comment: Using Nicorette Gum  . Alcohol Use: 0.0 oz/week    0 Standard drinks or equivalent per week     Comment: occasionaly   Objective:   BP 110/64 mmHg  Pulse 98  Temp(Src) 97.5 F (36.4 C) (Oral)  Resp 16  SpO2 96%  Physical Exam  About 1cm abrasion left buttocks without drainage and about 1mm surrounding erythema. ,     Assessment & Plan:     1. Pressure ulcer (stage II) Call if not rapidly improving  - silver sulfADIAZINE (SILVADENE) 1 % cream; Apply 1 application topically daily.  Dispense: 50 g; Refill: 0       Mila Merryonald Alleene Stoy, MD  [facility]

## 2015-12-07 NOTE — Progress Notes (Signed)
*  PRELIMINARY RESULTS* Echocardiogram 2D Echocardiogram has been performed.  Georgann HousekeeperJerry R Hege 12/07/2015, 11:50 AM

## 2015-12-13 ENCOUNTER — Ambulatory Visit: Payer: Medicare Other | Admitting: Licensed Clinical Social Worker

## 2015-12-28 ENCOUNTER — Other Ambulatory Visit: Payer: Self-pay | Admitting: Family Medicine

## 2015-12-28 NOTE — Telephone Encounter (Signed)
Please call in alprazolam.  

## 2016-01-04 ENCOUNTER — Other Ambulatory Visit: Payer: Self-pay | Admitting: Family Medicine

## 2016-01-30 ENCOUNTER — Ambulatory Visit: Payer: Self-pay | Admitting: Family Medicine

## 2016-02-06 ENCOUNTER — Ambulatory Visit: Payer: Self-pay | Admitting: Family Medicine

## 2016-02-07 ENCOUNTER — Ambulatory Visit
Admission: RE | Admit: 2016-02-07 | Discharge: 2016-02-07 | Disposition: A | Discharge status: Home / Self Care | Payer: Medicare Other | Source: Ambulatory Visit | Attending: Family Medicine | Admitting: Family Medicine

## 2016-02-07 ENCOUNTER — Ambulatory Visit (INDEPENDENT_AMBULATORY_CARE_PROVIDER_SITE_OTHER): Payer: Medicare Other | Admitting: Family Medicine

## 2016-02-07 ENCOUNTER — Encounter: Payer: Self-pay | Admitting: Family Medicine

## 2016-02-07 VITALS — BP 104/60 | HR 86 | Temp 98.6°F | Resp 16

## 2016-02-07 DIAGNOSIS — M25551 Pain in right hip: Secondary | ICD-10-CM | POA: Diagnosis not present

## 2016-02-07 DIAGNOSIS — F909 Attention-deficit hyperactivity disorder, unspecified type: Secondary | ICD-10-CM

## 2016-02-07 DIAGNOSIS — N3000 Acute cystitis without hematuria: Secondary | ICD-10-CM

## 2016-02-07 DIAGNOSIS — M1612 Unilateral primary osteoarthritis, left hip: Secondary | ICD-10-CM | POA: Insufficient documentation

## 2016-02-07 DIAGNOSIS — S79912A Unspecified injury of left hip, initial encounter: Secondary | ICD-10-CM | POA: Diagnosis not present

## 2016-02-07 DIAGNOSIS — M21851 Other specified acquired deformities of right thigh: Secondary | ICD-10-CM | POA: Diagnosis not present

## 2016-02-07 DIAGNOSIS — F988 Other specified behavioral and emotional disorders with onset usually occurring in childhood and adolescence: Secondary | ICD-10-CM

## 2016-02-07 DIAGNOSIS — M25552 Pain in left hip: Principal | ICD-10-CM

## 2016-02-07 DIAGNOSIS — G894 Chronic pain syndrome: Secondary | ICD-10-CM

## 2016-02-07 DIAGNOSIS — S79911A Unspecified injury of right hip, initial encounter: Secondary | ICD-10-CM | POA: Diagnosis not present

## 2016-02-07 DIAGNOSIS — N390 Urinary tract infection, site not specified: Secondary | ICD-10-CM | POA: Diagnosis not present

## 2016-02-07 LAB — POCT URINALYSIS DIPSTICK
Glucose, UA: NEGATIVE
Nitrite, UA: POSITIVE
Spec Grav, UA: 1.02
UROBILINOGEN UA: 1
pH, UA: 6

## 2016-02-07 MED ORDER — METHADONE HCL 10 MG PO TABS: 30.0000 mg | ORAL_TABLET | Freq: Three times a day (TID) | ORAL | Status: DC

## 2016-02-07 MED ORDER — AMPHETAMINE-DEXTROAMPHET ER 20 MG PO CP24: 20.0000 mg | ORAL_CAPSULE | Freq: Every day | ORAL | Status: DC

## 2016-02-07 MED ORDER — OXYCODONE HCL 15 MG PO TABS: ORAL_TABLET | ORAL | Status: DC

## 2016-02-07 MED ORDER — CIPROFLOXACIN HCL 500 MG PO TABS: 500.0000 mg | ORAL_TABLET | Freq: Two times a day (BID) | ORAL | Status: AC
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
## 2016-02-08 LAB — URINE CULTURE

## 2016-02-14 ENCOUNTER — Telehealth: Payer: Self-pay | Admitting: *Deleted

## 2016-02-14 DIAGNOSIS — M25559 Pain in unspecified hip: Secondary | ICD-10-CM

## 2016-02-14 DIAGNOSIS — F419 Anxiety disorder, unspecified: Secondary | ICD-10-CM

## 2016-02-14 DIAGNOSIS — F32A Depression, unspecified: Secondary | ICD-10-CM

## 2016-02-14 DIAGNOSIS — F988 Other specified behavioral and emotional disorders with onset usually occurring in childhood and adolescence: Secondary | ICD-10-CM

## 2016-02-14 DIAGNOSIS — F329 Major depressive disorder, single episode, unspecified: Secondary | ICD-10-CM

## 2016-02-14 NOTE — Telephone Encounter (Signed)
Needs referral to orthopedist for hip and back pain after fall. They will need to do some additional Xrays.

## 2016-02-14 NOTE — Telephone Encounter (Signed)
Patient refused referral to ortho. Patient is also requesting to change his antidepressant to something else, due to the fact that he thinks it is not working. Also he feels that the Adderall is making him feel anxious. Patient wanted to know if he could try another medication to replace Adderall or change to higher dose. Please advise?

## 2016-02-14 NOTE — Telephone Encounter (Signed)
Patient is requesting that Dr. Sherrie MustacheFisher take a second look at his x-rays. Patient states that he is in a lot of pain. Please advise?

## 2016-02-16 NOTE — Telephone Encounter (Signed)
He needs referral to psychiatrist to manage ADD and depression. Have entered order for sarah to schedule

## 2016-02-17 ENCOUNTER — Encounter: Payer: Self-pay | Admitting: Family Medicine

## 2016-02-17 NOTE — Telephone Encounter (Signed)
LMOVM for pt to return call 

## 2016-02-22 NOTE — Telephone Encounter (Signed)
LMTCB ED 

## 2016-02-24 NOTE — Telephone Encounter (Signed)
LMOVM for pt to return call 

## 2016-03-01 NOTE — Telephone Encounter (Signed)
LMOVM for pt to return call 

## 2016-03-06 ENCOUNTER — Other Ambulatory Visit: Payer: Self-pay | Admitting: Family Medicine

## 2016-03-06 NOTE — Telephone Encounter (Signed)
Rx called in to pharmacy. 

## 2016-03-06 NOTE — Telephone Encounter (Signed)
Please call in alprazolam.  

## 2016-03-13 ENCOUNTER — Telehealth: Payer: Self-pay | Admitting: Family Medicine

## 2016-03-13 NOTE — Telephone Encounter (Signed)
Pt states he has a bladder infection.  He states his mother is bringing over a urine sample right now.  He stated he needs to have it sent off to see what antibiotic he needs.  He will be going to see his father for a week and will be unavailable and wants this to be taken care of.

## 2016-03-13 NOTE — Telephone Encounter (Signed)
Urinalysis was clear with sp.gr. 1.030 and pH 6.0 without cells (RBC's or WBC's - negative for nitrites and glucose). States he is having some bladder spasm and thought it was due to infection. Uses self-catheterization to empty bladder 6 times a day. Has not noticed any blood in urine. Recommended he contact his urologist with no sign of infection in urinalysis today or recent fevers.

## 2016-03-17 ENCOUNTER — Other Ambulatory Visit: Payer: Self-pay | Admitting: Family Medicine

## 2016-04-18 ENCOUNTER — Encounter: Payer: Self-pay | Admitting: Family Medicine

## 2016-04-18 ENCOUNTER — Ambulatory Visit (INDEPENDENT_AMBULATORY_CARE_PROVIDER_SITE_OTHER): Payer: Medicare Other | Admitting: Family Medicine

## 2016-04-18 VITALS — BP 110/70 | HR 77 | Temp 95.0°F | Resp 16

## 2016-04-18 DIAGNOSIS — L989 Disorder of the skin and subcutaneous tissue, unspecified: Secondary | ICD-10-CM | POA: Diagnosis not present

## 2016-04-18 DIAGNOSIS — Z0189 Encounter for other specified special examinations: Secondary | ICD-10-CM

## 2016-04-18 DIAGNOSIS — G894 Chronic pain syndrome: Secondary | ICD-10-CM

## 2016-04-18 DIAGNOSIS — Z0283 Encounter for blood-alcohol and blood-drug test: Secondary | ICD-10-CM

## 2016-04-18 DIAGNOSIS — F909 Attention-deficit hyperactivity disorder, unspecified type: Secondary | ICD-10-CM | POA: Diagnosis not present

## 2016-04-18 DIAGNOSIS — K5900 Constipation, unspecified: Secondary | ICD-10-CM

## 2016-04-18 DIAGNOSIS — N390 Urinary tract infection, site not specified: Secondary | ICD-10-CM | POA: Diagnosis not present

## 2016-04-18 DIAGNOSIS — K5909 Other constipation: Secondary | ICD-10-CM

## 2016-04-18 DIAGNOSIS — F988 Other specified behavioral and emotional disorders with onset usually occurring in childhood and adolescence: Secondary | ICD-10-CM

## 2016-04-18 MED ORDER — LEVOTHYROXINE SODIUM 75 MCG PO TABS: ORAL_TABLET | ORAL | 5 refills | Status: DC

## 2016-04-18 MED ORDER — OXYCODONE HCL 15 MG PO TABS: ORAL_TABLET | ORAL | 0 refills | Status: DC

## 2016-04-18 MED ORDER — METHADONE HCL 10 MG PO TABS: 20.0000 mg | ORAL_TABLET | Freq: Four times a day (QID) | ORAL | 0 refills | Status: DC

## 2016-04-18 MED ORDER — DRONABINOL 10 MG PO CAPS: 10.0000 mg | ORAL_CAPSULE | Freq: Four times a day (QID) | ORAL | 1 refills | Status: DC | PRN

## 2016-04-18 MED ORDER — AMPHETAMINE-DEXTROAMPHETAMINE 10 MG PO TABS: 10.0000 mg | ORAL_TABLET | Freq: Two times a day (BID) | ORAL | 0 refills | Status: DC

## 2016-04-18 MED ORDER — NAPROXEN 500 MG PO TABS: 500.0000 mg | ORAL_TABLET | Freq: Two times a day (BID) | ORAL | 3 refills | Status: DC

## 2016-04-18 MED ORDER — LUBIPROSTONE 24 MCG PO CAPS: 24.0000 ug | ORAL_CAPSULE | Freq: Two times a day (BID) | ORAL | 12 refills | Status: DC

## 2016-04-18 NOTE — Progress Notes (Signed)
Patient: Justin [name] Male    DOB: May 09, 1981   35 y.o.   MRN: [id number] Visit Date: 04/18/2016  Today's Provider: Mila Merry, MD   Chief Complaint  Patient presents with  . Follow-up  . Medication Refill  . Pain  . ADD   Subjective:    HPI  Chronic pain associated with significant psychosocial dysfunction: From 02/07/2016-no changes. Refilled medications. Has been on same medication regiment since moving back to [place] from [place] and feels it is working well. He has tried cutting methadone from 2 tablet to 1 1/2 tablets at a time, but pain becomes much more severe when he does so.   ADD (attention deficit disorder): From 02/07/2016-decreased adderall er from 30 mg to 20 mg qd. He thinks it is working as well as the 30mg , but he states he thinks it stays in his system too long and has some difficulty falling asleep.    Is travelling to [place] for the next few weeks and request refill all medications before leaving. He is also concerned about skin lesions he recently notice.   Has mole abdomen that has been present for years, but has enlarged the last few years. Is not red or painful at all   Also noticed two Lesions on forehead about 6 months ago which are difficult to see but he can feel when he rubs his hand across his forehead.    Allergies  Allergen Reactions  . Azithromycin     throat closes  . Iodine   . Lorazepam     extremely hostile  . Meperidine   . Morphine Sulfate     Psychotic symptoms   Current Meds  Medication Sig  . alprazolam (XANAX) 2 MG tablet TAKE ONE TABLET EVERY 6 HOURS AS NEEDED  . amphetamine-dextroamphetamine (ADDERALL XR) 20 MG 24 hr capsule Take 1 capsule (20 mg total) by mouth daily.  . baclofen (LIORESAL) 20 MG tablet Take 1 tablet (20 mg total) by mouth 4 (four) times daily.  . clotrimazole-betamethasone (LOTRISONE) cream Apply 1 application topically 2 (two) times daily.  . dantrolene (DANTRIUM) 25 MG capsule  Take 1 capsule by mouth daily. Reported on 10/26/2015  . diphenhydrAMINE (BENADRYL) 25 MG tablet Take 1 tablet by mouth as needed.  . docusate sodium (COLACE) 250 MG capsule Take 1 capsule (250 mg total) by mouth 4 (four) times daily as needed.  . dronabinol (MARINOL) 10 MG capsule Take 1 capsule (10 mg total) by mouth every 6 (six) hours as needed.  . gabapentin (NEURONTIN) 800 MG tablet Take 1 tablet (800 mg total) by mouth 4 (four) times daily.  Marland Kitchen levothyroxine (SYNTHROID, LEVOTHROID) 75 MCG tablet TAKE ONE TABLET EVERY DAY BEFORE BREAKFAST  . lidocaine (LMX) 4 % cream Apply 1 application topically 3 (three) times daily.  Marland Kitchen lubiprostone (AMITIZA) 24 MCG capsule Take 1 capsule (24 mcg total) by mouth 2 (two) times daily.  . meclizine (ANTIVERT) 25 MG tablet Take 1 tablet (25 mg total) by mouth as needed.  . methadone (DOLOPHINE) 10 MG tablet Take 3 tablets (30 mg total) by mouth every 8 (eight) hours.  . Mineral Oil OIL daily.  . naproxen (NAPROSYN) 500 MG tablet Take 1 tablet (500 mg total) by mouth 2 (two) times daily.  Marland Kitchen omeprazole (PRILOSEC) 20 MG capsule TAKE 1 CAPSULE EVERY DAY  . oxyCODONE (ROXICODONE) 15 MG immediate release tablet TAKE ONE TABLET EVERY 4 HOURS  . PARoxetine (PAXIL) 30 MG tablet Take 2  tablets (60 mg total) by mouth daily.  . phenazopyridine (PYRIDIUM) 200 MG tablet Take 1 tablet by mouth 3 (three) times daily.  . polyethylene glycol powder (GLYCOLAX/MIRALAX) powder Take 17 g by mouth as needed.  . promethazine (PHENERGAN) 25 MG tablet Take 1 tablet (25 mg total) by mouth every 4 (four) hours as needed.  . silver sulfADIAZINE (SILVADENE) 1 % cream Apply 1 application topically daily.  Marland Kitchen tiZANidine (ZANAFLEX) 4 MG tablet Take 2 tablets (8 mg total) by mouth 4 (four) times daily.   Patient Active Problem List   Diagnosis Date Noted  . Depression 11/30/2015  . Chronic hoarseness 10/26/2015  . Hypothyroidism 10/26/2015  . Elevated transaminase level 08/10/2015  .  Paraplegia (HCC) 07/21/2015  . Bladder neurogenesis 04/28/2015  . Infection due to drug-resistant organism 03/22/2015  . Chronic UTI 03/11/2015  . Chronic pain associated with significant psychosocial dysfunction 02/18/2015  . ADD (attention deficit disorder) 02/17/2015  . Anxiety 02/17/2015  . Arthritis or polyarthritis, rheumatoid (HCC) 02/17/2015  . Chronic constipation 02/17/2015  . Bed sore on buttock 02/17/2015  . Abscess, dental 02/17/2015  . Dermatitis, eczematoid 02/17/2015  . Dizziness 02/17/2015  . Esophageal reflux 02/17/2015  . BP (high blood pressure) 02/17/2015  . Hypesthesia 02/17/2015  . Feeling bilious 02/17/2015  . Decubitus ulcer 02/17/2015  . Spasm 02/17/2015  . Concussion and swelling of spinal cord 02/17/2015  . Body tinea 02/17/2015  . GERD (gastroesophageal reflux disease) 02/17/2015   Past Medical History:  Diagnosis Date  . Chronic anxiety   . Chronic depression   . Chronic pain syndrome    secondary to fracture T9 and L3 with extensive spinal surgeery  . Postoperative wound infection of right hip 2012    Review of Systems  Constitutional: Negative for appetite change, chills and fever.  Respiratory: Negative for chest tightness, shortness of breath and wheezing.   Cardiovascular: Negative for chest pain and palpitations.  Gastrointestinal: Negative for abdominal pain, nausea and vomiting.    Social History  Substance Use Topics  . Smoking status: Former Games developer  . Smokeless tobacco: Not on file     Comment: Using Nicorette Gum  . Alcohol use 0.0 oz/week     Comment: occasionaly   Objective:   BP 110/70 (BP Location: Right Arm, Patient Position: Sitting, Cuff Size: Large)   Pulse 77   Temp (!) 95 F (35 C) (Oral)   Resp 16   SpO2 95%   Physical Exam   General Appearance:    Alert, cooperative, no distress  Eyes:    PERRL, conjunctiva/corneas clear, EOM's intact       Lungs:     Clear to auscultation bilaterally, respirations  unlabored  Heart:    Regular rate and rhythm  Neurologic:   Awake, alert, oriented x 3. No apparent focal neurological           defect.   Skin:  About 1cm oval well demarcated light brown benign appearing nevus mid abdomen. There is a tiny flesh colored lesion on his forehead which is perfectly round with raised edges, which under microscopic is similar to tiny BCC.        Assessment & Plan:     1. Benign skin lesion of forehead This may be a very tiny BCC, but is really too small to characterize. We discussed Ddx and slow course of BCC. He is very concerned because an acquaintance was apparently recently diagnosed with advanced skin cancer so he wants to go ahead and  get lesion checked out - Ambulatory referral to Dermatology  2. Chronic pain associated with significant psychosocial dysfunction Stable on current regiment. Needs refills today.  - methadone (DOLOPHINE) 10 MG tablet; Take 2 tablets (20 mg total) by mouth every 6 (six) hours.  Dispense: 270 tablet; Refill: 0 - oxyCODONE (ROXICODONE) 15 MG immediate release tablet; TAKE ONE TABLET EVERY 4 HOURS  Dispense: 120 tablet; Refill: 0 - dronabinol (MARINOL) 10 MG capsule; Take 1 capsule (10 mg total) by mouth every 6 (six) hours as needed.  Dispense: 30 capsule; Refill: 1  3. Chronic constipation Needs refill amitiza which is working well.  - lubiprostone (AMITIZA) 24 MCG capsule; Take 1 capsule (24 mcg total) by mouth 2 (two) times daily.  Dispense: 60 capsule; Refill: 12  4. Encounter for drug screening  - Pain Mgt Scrn (14 Drugs), Ur  5. Chronic UTI  - POCT urinalysis dipstick  6. ADD  adderall er working well, but keeping him up at night. Will change to  IR to take once or twice a day.       Mila Merry, MD  [facility]

## 2016-04-19 LAB — PAIN MGT SCRN (14 DRUGS), UR
Amphetamine Screen, Ur: NEGATIVE ng/mL
BENZODIAZEPINE SCREEN, URINE: POSITIVE ng/mL
BUPRENORPHINE, URINE: NEGATIVE ng/mL
Barbiturate Screen, Ur: NEGATIVE ng/mL
CREATININE(CRT), U: 207.5 mg/dL (ref 20.0–300.0)
Cannabinoids Ur Ql Scn: NEGATIVE ng/mL
Cocaine(Metab.)Screen, Urine: NEGATIVE ng/mL
FENTANYL, URINE: NEGATIVE pg/mL
METHADONE SCREEN, URINE: POSITIVE ng/mL
Meperidine Screen, Urine: NEGATIVE ng/mL
OPIATE SCRN UR: POSITIVE ng/mL
OXYCODONE+OXYMORPHONE UR QL SCN: POSITIVE ng/mL
PCP SCRN UR: NEGATIVE ng/mL
PH UR, DRUG SCRN: 5.6 (ref 4.5–8.9)
Propoxyphene, Screen: NEGATIVE ng/mL
Tramadol Ur Ql Scn: NEGATIVE ng/mL

## 2016-04-19 LAB — POCT URINALYSIS DIPSTICK
Bilirubin, UA: NEGATIVE
Glucose, UA: NEGATIVE
KETONES UA: NEGATIVE
Leukocytes, UA: NEGATIVE
Nitrite, UA: NEGATIVE
PH UA: 5
RBC UA: NEGATIVE
UROBILINOGEN UA: 0.2

## 2016-04-27 ENCOUNTER — Encounter: Payer: Self-pay | Admitting: Family Medicine

## 2016-04-27 DIAGNOSIS — F1190 Opioid use, unspecified, uncomplicated: Secondary | ICD-10-CM | POA: Insufficient documentation

## 2016-06-27 DIAGNOSIS — L218 Other seborrheic dermatitis: Secondary | ICD-10-CM | POA: Diagnosis not present

## 2016-06-27 DIAGNOSIS — L89309 Pressure ulcer of unspecified buttock, unspecified stage: Secondary | ICD-10-CM | POA: Diagnosis not present

## 2016-06-27 DIAGNOSIS — L858 Other specified epidermal thickening: Secondary | ICD-10-CM | POA: Diagnosis not present

## 2016-06-27 DIAGNOSIS — L738 Other specified follicular disorders: Secondary | ICD-10-CM | POA: Diagnosis not present

## 2016-06-27 DIAGNOSIS — Z23 Encounter for immunization: Secondary | ICD-10-CM | POA: Diagnosis not present

## 2016-07-04 ENCOUNTER — Other Ambulatory Visit: Payer: Self-pay | Admitting: Family Medicine

## 2016-07-04 ENCOUNTER — Other Ambulatory Visit (INDEPENDENT_AMBULATORY_CARE_PROVIDER_SITE_OTHER): Payer: Medicare Other

## 2016-07-04 DIAGNOSIS — N3001 Acute cystitis with hematuria: Secondary | ICD-10-CM

## 2016-07-04 LAB — POCT URINALYSIS DIPSTICK
BILIRUBIN UA: NEGATIVE
Glucose, UA: NEGATIVE
KETONES UA: NEGATIVE
Nitrite, UA: POSITIVE
PH UA: 6.5
Urobilinogen, UA: 0.2

## 2016-07-04 NOTE — Progress Notes (Signed)
Patient's mom stopped by the office and dropped off an urine specimen and reports that the patient feels that he may have an UTI. Urine was positive for infection, and was sent for culture. Will call the patient once urine culture results are in.

## 2016-07-09 LAB — URINE CULTURE

## 2016-07-10 ENCOUNTER — Telehealth: Payer: Self-pay | Admitting: Family Medicine

## 2016-07-10 NOTE — Telephone Encounter (Signed)
Called [facility] and they should be faxing over the results shortly.

## 2016-07-10 NOTE — Telephone Encounter (Signed)
Pt is calling back regarding test UC. To see if he has an infection.  [phone]  Thanks Barth Kirkseri

## 2016-07-11 ENCOUNTER — Telehealth: Payer: Self-pay

## 2016-07-11 NOTE — Telephone Encounter (Signed)
Pt is calling to see if we have the results and if a Rx has been sent to the pharmacy.  CB#[phone]/MW

## 2016-07-11 NOTE — Telephone Encounter (Signed)
Patient was advised of culture, prescription was called into [facility] pharmacy. Patient is requesting that Dr. Sherrie MustacheFisher call in prescription for bladder leakage, he states he was previously prescribed medication but [place] Bishop Lim[place]rowski [place] [facility] patient is requesting that Dr. Sherrie MustacheFisher refill. Please review. KW

## 2016-07-11 NOTE — Telephone Encounter (Signed)
He needs to call his urologist about this.

## 2016-07-11 NOTE — Telephone Encounter (Signed)
See result note.  

## 2016-07-11 NOTE — Telephone Encounter (Signed)
-----   Message from Malva Limesonald E Fisher, MD sent at 07/11/2016  7:47 AM EDT ----- Urine culture is c/w infection. If having any UTI symptoms then can start ciprofloxacin 500mg  BID for 7 days. Be sure to let his urologist known he is still having infections.

## 2016-07-11 NOTE — Telephone Encounter (Signed)
Fax received and given to provider

## 2016-07-12 NOTE — Telephone Encounter (Signed)
Patient advised and verbally voiced understanding.  

## 2016-07-20 ENCOUNTER — Ambulatory Visit: Payer: Self-pay | Admitting: Family Medicine

## 2016-07-23 ENCOUNTER — Ambulatory Visit: Payer: Self-pay | Admitting: Family Medicine

## 2016-07-23 ENCOUNTER — Telehealth: Payer: Self-pay | Admitting: Family Medicine

## 2016-07-23 NOTE — Telephone Encounter (Signed)
Pt called saying he is supposed to see his Urologist in January until then she ask that Dr. Sherrie MustacheFisher send results to her when he has UCs done. He has a hard tim,e getting in to her office.  [name] would like a nurse to call him back.  He would like to explain more to a nurse about this.  His call back is  [phone]..  Thanks, Justin [name]

## 2016-07-23 NOTE — Telephone Encounter (Signed)
Tried calling patient. No answer. Left message to call back. Per Dr. Sherrie MustacheFisher ok for patient to bring in a urine sample this one time. We will need Urology fax number to send results of culture .

## 2016-07-24 ENCOUNTER — Other Ambulatory Visit: Payer: Self-pay | Admitting: Family Medicine

## 2016-07-25 NOTE — Telephone Encounter (Signed)
[id number] 

## 2016-07-26 NOTE — Telephone Encounter (Signed)
LMOVM for pt to return call 

## 2016-07-27 NOTE — Telephone Encounter (Signed)
Patient has been advised he states that all his symptoms have cleared and will call office back if symptoms return. KW

## 2016-07-31 ENCOUNTER — Ambulatory Visit (INDEPENDENT_AMBULATORY_CARE_PROVIDER_SITE_OTHER): Payer: Medicare Other | Admitting: Family Medicine

## 2016-07-31 ENCOUNTER — Encounter: Payer: Self-pay | Admitting: Family Medicine

## 2016-07-31 ENCOUNTER — Ambulatory Visit: Payer: Self-pay | Admitting: Family Medicine

## 2016-07-31 VITALS — BP 128/78 | Temp 98.6°F | Resp 16

## 2016-07-31 DIAGNOSIS — R32 Unspecified urinary incontinence: Secondary | ICD-10-CM | POA: Diagnosis not present

## 2016-07-31 DIAGNOSIS — F329 Major depressive disorder, single episode, unspecified: Secondary | ICD-10-CM | POA: Diagnosis not present

## 2016-07-31 DIAGNOSIS — G8220 Paraplegia, unspecified: Secondary | ICD-10-CM | POA: Diagnosis not present

## 2016-07-31 DIAGNOSIS — F1190 Opioid use, unspecified, uncomplicated: Secondary | ICD-10-CM | POA: Diagnosis not present

## 2016-07-31 DIAGNOSIS — G904 Autonomic dysreflexia: Secondary | ICD-10-CM | POA: Diagnosis not present

## 2016-07-31 DIAGNOSIS — N319 Neuromuscular dysfunction of bladder, unspecified: Secondary | ICD-10-CM

## 2016-07-31 DIAGNOSIS — E039 Hypothyroidism, unspecified: Secondary | ICD-10-CM | POA: Diagnosis not present

## 2016-07-31 DIAGNOSIS — G894 Chronic pain syndrome: Secondary | ICD-10-CM

## 2016-07-31 DIAGNOSIS — G4700 Insomnia, unspecified: Secondary | ICD-10-CM

## 2016-07-31 DIAGNOSIS — F339 Major depressive disorder, recurrent, unspecified: Secondary | ICD-10-CM

## 2016-07-31 DIAGNOSIS — F988 Other specified behavioral and emotional disorders with onset usually occurring in childhood and adolescence: Secondary | ICD-10-CM | POA: Diagnosis not present

## 2016-07-31 DIAGNOSIS — R110 Nausea: Secondary | ICD-10-CM | POA: Diagnosis not present

## 2016-07-31 DIAGNOSIS — N390 Urinary tract infection, site not specified: Secondary | ICD-10-CM

## 2016-07-31 DIAGNOSIS — F32A Depression, unspecified: Secondary | ICD-10-CM

## 2016-07-31 DIAGNOSIS — F419 Anxiety disorder, unspecified: Secondary | ICD-10-CM

## 2016-07-31 MED ORDER — AMPHETAMINE-DEXTROAMPHETAMINE 15 MG PO TABS: 15.0000 mg | ORAL_TABLET | Freq: Two times a day (BID) | ORAL | 0 refills | Status: DC

## 2016-07-31 MED ORDER — NIFEDIPINE 20 MG PO CAPS: ORAL_CAPSULE | ORAL | 1 refills | Status: DC

## 2016-07-31 MED ORDER — OXYCODONE HCL 15 MG PO TABS: ORAL_TABLET | ORAL | 0 refills | Status: DC

## 2016-07-31 MED ORDER — METHADONE HCL 10 MG PO TABS: 20.0000 mg | ORAL_TABLET | Freq: Four times a day (QID) | ORAL | 0 refills | Status: DC

## 2016-07-31 MED ORDER — PAROXETINE HCL 30 MG PO TABS: 60.0000 mg | ORAL_TABLET | Freq: Every day | ORAL | 6 refills | Status: DC

## 2016-07-31 MED ORDER — ALBUTEROL SULFATE HFA 108 (90 BASE) MCG/ACT IN AERS: 2.0000 | INHALATION_SPRAY | Freq: Four times a day (QID) | RESPIRATORY_TRACT | 0 refills | Status: DC | PRN

## 2016-07-31 NOTE — Patient Instructions (Signed)
Ginger for chronic nausea  What is this medicine? GINGER (JIN jer) is an herbal or dietary supplement. It is promoted to help support good digestive health. The FDA has not approved this supplement for any medical use. This herb may be used for other purposes; ask your health care provider or pharmacist if you have questions. This medicine may be used for other purposes; ask your health care provider or pharmacist if you have questions. COMMON BRAND NAME(S): Dramamine Motion Sickness Relief What should I tell my health care provider before I take this medicine? They need to know if you have any of these conditions: -bleeding problems -diabetes -heart disease -gall bladder disease or gallstones -low blood sugar -an unusual or allergic reaction to ginger, other herbs, plants, medicines, foods, dyes, or preservatives -pregnant or trying to get pregnant -breast-feeding How should I use this medicine? Take this supplement by mouth with a glass of water. Follow the directions on the package labeling, or take as directed by your health care professional. If this supplement upsets your stomach, take it with food. Do not take this supplement more often than directed. Contact your pediatrician regarding the use of this supplement in children. Special care may be needed. Overdosage: If you think you have taken too much of this medicine contact a poison control center or emergency room at once. NOTE: This medicine is only for you. Do not share this medicine with others. What if I miss a dose? If you miss a dose, take it as soon as you can. If it is almost time for your next dose, take only that dose. Do not take double or extra doses. What may interact with this medicine? Check with your doctor or healthcare professional if you are taking any of the following medications: -aspirin and aspirin-like drugs -dipyridamole -medicines for treating diabetes or high blood sugar -medicines that treat or  prevent blood clots like cilostazol, clopidogrel, enoxaparin, ticlopidine, warfarin -NSAIDs, medicines for pain and inflammation, like ibuprofen or naproxen This list may not describe all possible interactions. Give your health care provider a list of all the medicines, herbs, non-prescription drugs, or dietary supplements you use. Also tell them if you smoke, drink alcohol, or use illegal drugs. Some items may interact with your medicine. What should I watch for while using this medicine? See your doctor if your symptoms do not get better or if they get worse. If you are pregnant, do not use this supplement unless your doctor tells you to. Talk to your doctor about your symptoms before you take any medicine or supplement. If you are scheduled for any medical or dental procedure, tell your healthcare provider that you are taking this supplement. You may need to stop taking this supplement before the procedure. Herbal or dietary supplements are not regulated like medicines. Rigid quality control standards are not required for dietary supplements. The purity and strength of these products can vary. The safety and effect of this dietary supplement for a certain disease or illness is not well known. This product is not intended to diagnose, treat, cure or prevent any disease. The Food and Drug Administration suggests the following to help consumers protect themselves: -Always read product labels and follow directions. -Natural does not mean a product is safe for humans to take. -Look for products that include USP after the ingredient name. This means that the manufacturer followed the standards of the US Pharmacopoeia. -Supplements made or sold by a nationally known food or drug company are more likely  to be made under tight controls. You can write to the company for more information about how the product was made. What side effects may I notice from receiving this medicine? Side effects that you should  report to your doctor or health care professional as soon as possible: -allergic reactions like skin rash, itching or hives, swelling of the face, lips, or tongue -breathing problems -slow heart rate -unusual bleeding, bruising -unusually weak or tired Side effects that usually do not require medical attention (report to your doctor or health care professional if they continue or are bothersome): -diarrhea -heartburn -headache -stomach upset This list may not describe all possible side effects. Call your doctor for medical advice about side effects. You may report side effects to FDA at 1-800-FDA-1088. Where should I keep my medicine? Keep out of the reach of children. Store at room temperature or as directed on the package label. Protect from moisture. Throw away any unused supplement after the expiration date. NOTE: This sheet is a summary. It may not cover all possible information. If you have questions about this medicine, talk to your doctor, pharmacist, or health care provider.  2017 Elsevier/Gold Standard (2014-11-04 15:30:58)

## 2016-07-31 NOTE — Progress Notes (Signed)
Patient: Justin RinneJoseph Nathaniel Samonte Male    DOB: 11-18-1980   35 y.o.   MRN: [id number] Visit Date: 07/31/2016  Today's Provider: Mila Merryonald Grizel Vesely, MD   Chief Complaint  Patient presents with  . Pain  . Urinary Tract Infection  . Insomnia   Subjective:    HPI Chronic pain, follow up:  Patient was last seen 3 months ago. No changes were made. Patient reports that he feels as if his pain has gotten worse. He reports that it is beginning to keep him up at night. He reports that he only averages about 3 hours of sleep a day. States he tried reducing methadone from 20mg  Q6 hours to 10mg  Q6 hours and states pain became umbearable Pain is in back and legs which feels like they are being set on fire.   UTI Patient reports that he feels that he may have another urinary tract infection. He reports that his urine has a strong odor and it is very dark. Patient was last treated for a UTI about 1 month ago, and he was treated with Cipro for 7 days.  ADD, follow up: Patient thinks that his adderall may need to be adjusted. He reports that he becomes very anxious when he has to leave his house, and when his is around large crowds.   Hypothyroidism, follow up: Patient feels that his thyroid level could be off. His last TSH was checked on 12/05/18 and it was 5.836. Increased levothyroxine  75mcg daily. Patient reports that he feels more anxious than usual since then. He is not sure if this is related to ADD medications above.   He presented with extended list of medications to review and refill today.      He also reports urinary incontinence and would like to try U[place]lak penile clamp. [facility], but does not have follow up in the near future and would like to go ahead and get order for clamp.      Allergies  Allergen Reactions  . Azithromycin     throat closes  . Iodine   . Lorazepam     extremely hostile  . Meperidine   . Morphine Sulfate     Psychotic symptoms      Current Outpatient Prescriptions:  .  alprazolam (XANAX) 2 MG tablet, TAKE ONE TABLET EVERY 6 HOURS AS NEEDED, Disp: 120 tablet, Rfl: 5 .  amphetamine-dextroamphetamine (ADDERALL) 10 MG tablet, Take 1 tablet (10 mg total) by mouth 2 (two) times daily., Disp: 60 tablet, Rfl: 0 .  baclofen (LIORESAL) 20 MG tablet, TAKE ONE TABLET FOUR TIMES DAILY, Disp: 120 each, Rfl: 12 .  clotrimazole-betamethasone (LOTRISONE) cream, Apply 1 application topically 2 (two) times daily., Disp: 30 g, Rfl: 4 .  diphenhydrAMINE (BENADRYL) 25 MG tablet, Take 1 tablet by mouth as needed., Disp: , Rfl:  .  docusate sodium (COLACE) 250 MG capsule, Take 1 capsule (250 mg total) by mouth 4 (four) times daily as needed., Disp: 120 capsule, Rfl: 3 .  dronabinol (MARINOL) 10 MG capsule, Take 1 capsule (10 mg total) by mouth every 6 (six) hours as needed., Disp: 30 capsule, Rfl: 1 .  gabapentin (NEURONTIN) 800 MG tablet, Take 1 tablet (800 mg total) by mouth 4 (four) times daily., Disp: 120 tablet, Rfl: 5 .  levothyroxine (SYNTHROID, LEVOTHROID) 75 MCG tablet, TAKE ONE TABLET EVERY DAY BEFORE BREAKFAST, Disp: 30 tablet, Rfl: 5 .  lidocaine (LMX) 4 % cream, Apply 1 application topically  3 (three) times daily., Disp: 133 g, Rfl: 5 .  lubiprostone (AMITIZA) 24 MCG capsule, Take 1 capsule (24 mcg total) by mouth 2 (two) times daily., Disp: 60 capsule, Rfl: 12 .  methadone (DOLOPHINE) 10 MG tablet, Take 2 tablets (20 mg total) by mouth every 6 (six) hours., Disp: 270 tablet, Rfl: 0 .  Mineral Oil OIL, daily., Disp: , Rfl:  .  naproxen (NAPROSYN) 500 MG tablet, Take 1 tablet (500 mg total) by mouth 2 (two) times daily., Disp: 60 tablet, Rfl: 3 .  omeprazole (PRILOSEC) 20 MG capsule, TAKE 1 CAPSULE EVERY DAY, Disp: 30 capsule, Rfl: 12 .  oxyCODONE (ROXICODONE) 15 MG immediate release tablet, TAKE ONE TABLET EVERY 4 HOURS, Disp: 120 tablet, Rfl: 0 .  PARoxetine (PAXIL) 30 MG tablet, Take 2 tablets (60 mg total) by mouth daily.,  Disp: 60 tablet, Rfl: 6 .  phenazopyridine (PYRIDIUM) 200 MG tablet, Take 1 tablet by mouth 3 (three) times daily., Disp: , Rfl:  .  polyethylene glycol powder (GLYCOLAX/MIRALAX) powder, Take 17 g by mouth as needed., Disp: 255 g, Rfl: 3 .  promethazine (PHENERGAN) 25 MG tablet, Take 1 tablet (25 mg total) by mouth every 4 (four) hours as needed., Disp: 30 tablet, Rfl: 3 .  silver sulfADIAZINE (SILVADENE) 1 % cream, Apply 1 application topically daily., Disp: 50 g, Rfl: 0 .  tiZANidine (ZANAFLEX) 4 MG tablet, Take 2 tablets (8 mg total) by mouth 4 (four) times daily., Disp: 240 tablet, Rfl: 6 .  dantrolene (DANTRIUM) 25 MG capsule, Take 1 capsule by mouth daily. Reported on 10/26/2015, Disp: , Rfl:   Review of Systems  Constitutional: Positive for fatigue. Negative for activity change, appetite change, chills, diaphoresis, fever and unexpected weight change.  Respiratory: Negative.   Genitourinary: Positive for decreased urine volume. Negative for hematuria.  Musculoskeletal: Positive for arthralgias, back pain and gait problem.  Psychiatric/Behavioral: The patient is nervous/anxious.     Social History  Substance Use Topics  . Smoking status: Former Games developermoker  . Smokeless tobacco: Not on file     Comment: Using Nicorette Gum  . Alcohol use 0.0 oz/week     Comment: occasionaly   Objective:   BP 128/78 (BP Location: Right Arm, Patient Position: Sitting, Cuff Size: Normal)   Temp 98.6 F (37 C)   Resp 16   Physical Exam   General Appearance:    Alert, cooperative, no distress, confined to wheelchair  Eyes:    PERRL, conjunctiva/corneas clear, EOM's intact       Lungs:     Clear to auscultation bilaterally, respirations unlabored  Heart:    Regular rate and rhythm  Neurologic:   Awake, alert, oriented x 3. No apparent focal neurological           defect.           Assessment & Plan:     1. Hypothyroidism, unspecified type Feeling a bit hyperactive since increase levothyroxine -  T4 AND TSH  2. Paraplegia (HCC) He states spasms have been particularly painful and would like to try CCB for autonomic dysreflexia as below - NIFEdipine (PROCARDIA) 20 MG capsule; One tablet three times a day as needed  Dispense: 60 capsule; Refill: 1  3. Chronic UTI   4. Depression, unspecified depression type Continue paroxetine which he is tolerating well.   5. Anxiety Stable. Continue current dose of alprazolam.   6. Attention deficit disorder, unspecified hyperactivity presence He states he feels a little more hyperactive lately and  may need to increase Adderall. Will try increasing to 15 and follow up in 3 months.  - amphetamine-dextroamphetamine (ADDERALL) 15 MG tablet; Take 1 tablet by mouth 2 (two) times daily.  Dispense: 60 tablet; Refill: 0  7. Autonomic dysreflexia Try CCB per his request - NIFEdipine (PROCARDIA) 20 MG capsule; One tablet three times a day as needed  Dispense: 60 capsule; Refill: 1  8. Urinary incontinence, unspecified type Advised to follow up ASAP with urology. He is going to check with medical supply store about availability of penile clamp.   9. Nausea Likely secondary to chronic medications. Continue prn promethazine  10. Bladder neurogenesis Continue follow up urology  11. Chronic pain associated with significant psychosocial dysfunction  - oxyCODONE (ROXICODONE) 15 MG immediate release tablet; TAKE ONE TABLET EVERY 4 HOURS  Dispense: 120 tablet; Refill: 0 - methadone (DOLOPHINE) 10 MG tablet; Take 2 tablets (20 mg total) by mouth every 6 (six) hours.  Dispense: 270 tablet; Refill: 0 - Pain Mgt Scrn (14 Drugs), Ur  12. Chronic, continuous use of opioids   13. Insomnia, unspecified type   14. Episode of recurrent major depressive disorder, unspecified depression episode severity (HCC)  - PARoxetine (PAXIL) 30 MG tablet; Take 2 tablets (60 mg total) by mouth daily.  Dispense: 60 tablet; Refill: 6      Addressed extensive list of  chronic and acute medical problems today requiring extensive time in counseling and coordination care.  Over half of this 50 minute visit were spent in counseling and coordinating care of multiple medical problems.    Mila Merry, MD  [facility]

## 2016-08-01 ENCOUNTER — Telehealth: Payer: Self-pay

## 2016-08-01 LAB — T4 AND TSH
T4 TOTAL: 9.4 ug/dL (ref 4.5–12.0)
TSH: 1.84 u[IU]/mL (ref 0.450–4.500)

## 2016-08-01 LAB — PAIN MGT SCRN (14 DRUGS), UR
AMPHETAMINE SCRN UR: NEGATIVE ng/mL
BARBITURATE SCRN UR: NEGATIVE ng/mL
BENZODIAZEPINE SCREEN, URINE: POSITIVE ng/mL
Buprenorphine, Urine: NEGATIVE ng/mL
CREATININE(CRT), U: 159.3 mg/dL (ref 20.0–300.0)
Cannabinoids Ur Ql Scn: NEGATIVE ng/mL
Cocaine(Metab.)Screen, Urine: NEGATIVE ng/mL
Fentanyl, Urine: NEGATIVE pg/mL
Meperidine Screen, Urine: NEGATIVE ng/mL
Methadone Scn, Ur: POSITIVE ng/mL
Opiate Scrn, Ur: NEGATIVE ng/mL
Oxycodone+Oxymorphone Ur Ql Scn: POSITIVE ng/mL
PCP Scrn, Ur: NEGATIVE ng/mL
Ph of Urine: 6.1 (ref 4.5–8.9)
Propoxyphene, Screen: NEGATIVE ng/mL
Tramadol Ur Ql Scn: NEGATIVE ng/mL

## 2016-08-01 NOTE — Telephone Encounter (Signed)
Patient has been advised. KW 

## 2016-08-01 NOTE — Telephone Encounter (Signed)
-----   Message from Malva Limesonald E Fisher, MD sent at 08/01/2016  7:51 AM EST ----- Thyroid functions are normal. Continue current dose of levothyroxine

## 2016-08-21 DIAGNOSIS — L89301 Pressure ulcer of unspecified buttock, stage 1: Secondary | ICD-10-CM | POA: Diagnosis not present

## 2016-08-21 DIAGNOSIS — J209 Acute bronchitis, unspecified: Secondary | ICD-10-CM | POA: Diagnosis not present

## 2016-08-21 DIAGNOSIS — G894 Chronic pain syndrome: Secondary | ICD-10-CM | POA: Diagnosis not present

## 2016-08-21 DIAGNOSIS — F321 Major depressive disorder, single episode, moderate: Secondary | ICD-10-CM | POA: Diagnosis not present

## 2016-08-21 DIAGNOSIS — Z993 Dependence on wheelchair: Secondary | ICD-10-CM | POA: Diagnosis not present

## 2016-08-21 DIAGNOSIS — R413 Other amnesia: Secondary | ICD-10-CM | POA: Diagnosis not present

## 2016-08-21 DIAGNOSIS — G8221 Paraplegia, complete: Secondary | ICD-10-CM | POA: Diagnosis not present

## 2016-08-21 DIAGNOSIS — R110 Nausea: Secondary | ICD-10-CM | POA: Diagnosis not present

## 2016-09-05 ENCOUNTER — Other Ambulatory Visit: Payer: Self-pay | Admitting: Family Medicine

## 2016-09-05 NOTE — Telephone Encounter (Signed)
Please call in alprazolam.  

## 2016-09-05 NOTE — Telephone Encounter (Signed)
Called into [facility]

## 2016-09-05 NOTE — Telephone Encounter (Signed)
Last ov 07/31/16 Last filled 03/06/16. Please review. Thank you. sd

## 2016-10-06 ENCOUNTER — Other Ambulatory Visit: Payer: Self-pay | Admitting: Family Medicine

## 2016-10-06 DIAGNOSIS — G8220 Paraplegia, unspecified: Secondary | ICD-10-CM

## 2016-10-15 ENCOUNTER — Telehealth: Payer: Self-pay | Admitting: Family Medicine

## 2016-10-15 NOTE — Telephone Encounter (Signed)
error 

## 2016-11-12 ENCOUNTER — Encounter: Payer: Self-pay | Admitting: Family Medicine

## 2016-11-12 ENCOUNTER — Ambulatory Visit (INDEPENDENT_AMBULATORY_CARE_PROVIDER_SITE_OTHER): Payer: Medicare HMO | Admitting: Family Medicine

## 2016-11-12 VITALS — BP 134/70 | HR 84 | Temp 98.0°F | Resp 16

## 2016-11-12 DIAGNOSIS — M7989 Other specified soft tissue disorders: Secondary | ICD-10-CM | POA: Diagnosis not present

## 2016-11-12 DIAGNOSIS — Z23 Encounter for immunization: Secondary | ICD-10-CM

## 2016-11-12 DIAGNOSIS — L739 Follicular disorder, unspecified: Secondary | ICD-10-CM | POA: Diagnosis not present

## 2016-11-12 DIAGNOSIS — E039 Hypothyroidism, unspecified: Secondary | ICD-10-CM | POA: Diagnosis not present

## 2016-11-12 DIAGNOSIS — G894 Chronic pain syndrome: Secondary | ICD-10-CM | POA: Diagnosis not present

## 2016-11-12 DIAGNOSIS — G8220 Paraplegia, unspecified: Secondary | ICD-10-CM

## 2016-11-12 DIAGNOSIS — F988 Other specified behavioral and emotional disorders with onset usually occurring in childhood and adolescence: Secondary | ICD-10-CM

## 2016-11-12 DIAGNOSIS — R69 Illness, unspecified: Secondary | ICD-10-CM | POA: Diagnosis not present

## 2016-11-12 MED ORDER — DOXYCYCLINE HYCLATE 100 MG PO TABS: 100.0000 mg | ORAL_TABLET | Freq: Two times a day (BID) | ORAL | 0 refills | Status: DC

## 2016-11-12 MED ORDER — AMPHETAMINE-DEXTROAMPHETAMINE 15 MG PO TABS: 15.0000 mg | ORAL_TABLET | Freq: Two times a day (BID) | ORAL | 0 refills | Status: DC

## 2016-11-12 MED ORDER — METHADONE HCL 10 MG PO TABS: 20.0000 mg | ORAL_TABLET | Freq: Four times a day (QID) | ORAL | 0 refills | Status: DC

## 2016-11-12 MED ORDER — OXYCODONE HCL 15 MG PO TABS: ORAL_TABLET | ORAL | 0 refills | Status: DC

## 2016-11-12 NOTE — Progress Notes (Signed)
Patient: Justin [name] Male    DOB: 1981/03/03   36 y.o.   MRN: [id number] Visit Date: 11/12/2016  Today's Provider: Mila Merry, MD   Chief Complaint  Patient presents with  . Hypothyroidism  . ADHD  . Pain   Subjective:    HPI Hypothyroidism, follow up: Patient was last seen 3 months ago. No changes were made in his medications. Patient is currently taking levothyroxine daily, and reports good symptom control.  ADD, follow up: Patient was last seen 3 months ago. Since last OV, Adderall was increase 15mg  twice daily. Patient reports that he feels much better since the medication was increased.   Chronic pain, follow up: Patient was last seen 3 months ago. No changes were made in his medications. Patient is requesting a refill on Oxycodone on today's visit.   Rash, follow up: Patient reports that he has a rash on his lower legs for over 2 weeks. He reports that he has not been using anything OTC for his symptoms.      Allergies  Allergen Reactions  . Azithromycin     throat closes  . Iodine   . Lorazepam     extremely hostile  . Meperidine   . Morphine Sulfate     Psychotic symptoms     Current Outpatient Prescriptions:  .  albuterol (PROVENTIL HFA;VENTOLIN HFA) 108 (90 Base) MCG/ACT inhaler, Inhale 2 puffs into the lungs every 6 (six) hours as needed for wheezing or shortness of breath., Disp: 1 Inhaler, Rfl: 0 .  alprazolam (XANAX) 2 MG tablet, TAKE ONE TABLET BY MOUTH EVERY 6 HOURS AS NEEDED, Disp: 120 tablet, Rfl: 5 .  amphetamine-dextroamphetamine (ADDERALL) 15 MG tablet, Take 1 tablet by mouth 2 (two) times daily., Disp: 60 tablet, Rfl: 0 .  baclofen (LIORESAL) 20 MG tablet, TAKE ONE TABLET FOUR TIMES DAILY, Disp: 120 each, Rfl: 12 .  clotrimazole-betamethasone (LOTRISONE) cream, Apply 1 application topically 2 (two) times daily., Disp: 30 g, Rfl: 4 .  dantrolene (DANTRIUM) 25 MG capsule, Take 1 capsule by mouth daily. Reported on  10/26/2015, Disp: , Rfl:  .  diphenhydrAMINE (BENADRYL) 25 MG tablet, Take 1 tablet by mouth as needed., Disp: , Rfl:  .  docusate sodium (COLACE) 250 MG capsule, Take 1 capsule (250 mg total) by mouth 4 (four) times daily as needed., Disp: 120 capsule, Rfl: 3 .  dronabinol (MARINOL) 10 MG capsule, Take 1 capsule (10 mg total) by mouth every 6 (six) hours as needed., Disp: 30 capsule, Rfl: 1 .  gabapentin (NEURONTIN) 800 MG tablet, Take 1 tablet (800 mg total) by mouth 4 (four) times daily., Disp: 120 tablet, Rfl: 5 .  levothyroxine (SYNTHROID, LEVOTHROID) 75 MCG tablet, TAKE ONE TABLET EVERY DAY BEFORE BREAKFAST, Disp: 30 tablet, Rfl: 5 .  lidocaine (LMX) 4 % cream, Apply 1 application topically 3 (three) times daily., Disp: 133 g, Rfl: 5 .  lubiprostone (AMITIZA) 24 MCG capsule, Take 1 capsule (24 mcg total) by mouth 2 (two) times daily., Disp: 60 capsule, Rfl: 12 .  methadone (DOLOPHINE) 10 MG tablet, Take 2 tablets (20 mg total) by mouth every 6 (six) hours., Disp: 270 tablet, Rfl: 0 .  Mineral Oil OIL, daily., Disp: , Rfl:  .  naproxen (NAPROSYN) 500 MG tablet, TAKE 1 TABLET BY MOUTH TWICE DAILY, Disp: 60 tablet, Rfl: 5 .  NIFEdipine (PROCARDIA) 20 MG capsule, One tablet three times a day as needed, Disp: 60 capsule, Rfl: 1 .  omeprazole (PRILOSEC) 20 MG capsule, TAKE 1 CAPSULE EVERY DAY, Disp: 30 capsule, Rfl: 12 .  oxyCODONE (ROXICODONE) 15 MG immediate release tablet, TAKE ONE TABLET EVERY 4 HOURS, Disp: 120 tablet, Rfl: 0 .  PARoxetine (PAXIL) 30 MG tablet, Take 2 tablets (60 mg total) by mouth daily., Disp: 60 tablet, Rfl: 6 .  phenazopyridine (PYRIDIUM) 200 MG tablet, Take 1 tablet by mouth 3 (three) times daily., Disp: , Rfl:  .  polyethylene glycol powder (GLYCOLAX/MIRALAX) powder, Take 17 g by mouth as needed., Disp: 255 g, Rfl: 3 .  promethazine (PHENERGAN) 25 MG tablet, Take 1 tablet (25 mg total) by mouth every 4 (four) hours as needed., Disp: 30 tablet, Rfl: 3 .  silver sulfADIAZINE  (SILVADENE) 1 % cream, Apply 1 application topically daily., Disp: 50 g, Rfl: 0 .  tiZANidine (ZANAFLEX) 4 MG tablet, TAKE TWO TABLETS FOUR TIMES DAILY, Disp: 240 tablet, Rfl: 5  Review of Systems  Constitutional: Negative.   Respiratory: Negative for apnea, cough, chest tightness, shortness of breath, wheezing and stridor.   Cardiovascular: Positive for leg swelling. Negative for chest pain and palpitations.  Skin: Positive for rash.    Social History  Substance Use Topics  . Smoking status: Former Games developermoker  . Smokeless tobacco: Never Used     Comment: Using Nicorette Gum  . Alcohol use 0.0 oz/week     Comment: occasionaly   Objective:   BP 134/70 (BP Location: Right Arm, Patient Position: Sitting, Cuff Size: Normal)   Pulse 84   Temp 98 F (36.7 C)   Resp 16   Physical Exam    General Appearance:    Alert, cooperative, no distress, confined to wheelchair  Eyes:    PERRL, conjunctiva/corneas clear, EOM's intact       Lungs:     Clear to auscultation bilaterally, respirations unlabored  Heart:    Regular rate and rhythm  Vasc:     Neurologic:   Awake, alert, oriented x 3. No apparent focal neurological           defect.   Derm:    Fine scatter papular lesion across both upper arms. Several inflamed follicular lesions both lower legs with faint erythematous base.       Assessment & Plan:     1. Hypothyroidism, unspecified type Doing well on current thyroid replacement.   2. Paraplegia (HCC)   3. Folliculitis  - doxycycline (VIBRA-TABS) 100 MG tablet; Take 1 tablet (100 mg total) by mouth 2 (two) times daily.  Dispense: 28 tablet; Refill: 0  4. Chronic pain associated with significant psychosocial dysfunction Stable on current pain medication regiment.  - oxyCODONE (ROXICODONE) 15 MG immediate release tablet; TAKE ONE TABLET EVERY 4 HOURS  Dispense: 120 tablet; Refill: 0 - methadone (DOLOPHINE) 10 MG tablet; Take 2 tablets (20 mg total) by mouth every 6 (six) hours.   Dispense: 270 tablet; Refill: 0  5. Attention deficit disorder, unspecified hyperactivity presence Doing well on increased dose of adderall  - amphetamine-dextroamphetamine (ADDERALL) 15 MG tablet; Take 1 tablet by mouth 2 (two) times daily.  Dispense: 60 tablet; Refill: 0  6. Left leg swelling  - US Venous Img Lower Unilateral Left; Future  7. Need for influenza vaccination  - Flu Vaccine QUAD 36+ mos IM       Mila Merryonald Melbert Botelho, MD  [facility]

## 2016-11-14 ENCOUNTER — Telehealth: Payer: Self-pay | Admitting: Family Medicine

## 2016-11-14 DIAGNOSIS — R339 Retention of urine, unspecified: Secondary | ICD-10-CM | POA: Diagnosis not present

## 2016-11-14 NOTE — Telephone Encounter (Signed)
Please review-aa 

## 2016-11-14 NOTE — Telephone Encounter (Signed)
Pt is requesting that ultrasound be ordered for right leg swelling.He has appointment for left leg and would like to get both done at the same time

## 2016-11-16 ENCOUNTER — Ambulatory Visit: Payer: Medicare HMO

## 2016-11-19 ENCOUNTER — Ambulatory Visit
Admission: RE | Admit: 2016-11-19 | Discharge: 2016-11-19 | Disposition: A | Discharge status: Home / Self Care | Payer: Medicare HMO | Source: Ambulatory Visit | Attending: Family Medicine | Admitting: Family Medicine

## 2016-11-19 ENCOUNTER — Ambulatory Visit: Admission: RE | Admit: 2016-11-19 | Payer: Medicare HMO | Source: Ambulatory Visit

## 2016-11-19 DIAGNOSIS — M79662 Pain in left lower leg: Secondary | ICD-10-CM | POA: Diagnosis not present

## 2016-11-19 DIAGNOSIS — M7989 Other specified soft tissue disorders: Secondary | ICD-10-CM | POA: Insufficient documentation

## 2016-11-23 ENCOUNTER — Telehealth: Payer: Self-pay

## 2016-11-23 NOTE — Telephone Encounter (Signed)
Notes Recorded by Malva Limesonald E Fisher, MD on 11/20/2016 at 7:59 AM EST No sign of clot in leg. Swelling is due to poor lymphatic and venous drainage. Need to keep legs elevated whenever possible.

## 2016-11-23 NOTE — Telephone Encounter (Signed)
-----   Message from Malva Limesonald E Fisher, MD sent at 11/20/2016  7:59 AM EST ----- No sign of clot in leg. Swelling is due to poor lymphatic and venous drainage. Need to keep legs elevated whenever possible.

## 2016-11-23 NOTE — Telephone Encounter (Signed)
LMTCB 11/23/2016  Thanks,   -Laura  

## 2016-12-05 DIAGNOSIS — R339 Retention of urine, unspecified: Secondary | ICD-10-CM | POA: Diagnosis not present

## 2016-12-21 ENCOUNTER — Other Ambulatory Visit: Payer: Self-pay | Admitting: Family Medicine

## 2016-12-21 DIAGNOSIS — G894 Chronic pain syndrome: Secondary | ICD-10-CM

## 2017-01-04 DIAGNOSIS — R339 Retention of urine, unspecified: Secondary | ICD-10-CM | POA: Diagnosis not present

## 2017-01-11 ENCOUNTER — Ambulatory Visit: Payer: Self-pay | Admitting: Family Medicine

## 2017-01-14 ENCOUNTER — Other Ambulatory Visit: Payer: Self-pay | Admitting: Family Medicine

## 2017-01-30 ENCOUNTER — Other Ambulatory Visit: Payer: Self-pay | Admitting: Family Medicine

## 2017-01-30 DIAGNOSIS — G894 Chronic pain syndrome: Secondary | ICD-10-CM

## 2017-01-31 ENCOUNTER — Other Ambulatory Visit: Payer: Self-pay | Admitting: Family Medicine

## 2017-02-05 DIAGNOSIS — R339 Retention of urine, unspecified: Secondary | ICD-10-CM | POA: Diagnosis not present

## 2017-02-08 ENCOUNTER — Other Ambulatory Visit: Payer: Self-pay | Admitting: Family Medicine

## 2017-02-08 ENCOUNTER — Ambulatory Visit: Payer: Medicare HMO | Admitting: Physician Assistant

## 2017-02-08 ENCOUNTER — Other Ambulatory Visit (INDEPENDENT_AMBULATORY_CARE_PROVIDER_SITE_OTHER): Payer: Medicare HMO

## 2017-02-08 ENCOUNTER — Other Ambulatory Visit: Payer: Self-pay

## 2017-02-08 DIAGNOSIS — N390 Urinary tract infection, site not specified: Secondary | ICD-10-CM

## 2017-02-08 LAB — POCT URINALYSIS DIPSTICK
BILIRUBIN UA: NEGATIVE
GLUCOSE UA: NEGATIVE
KETONES UA: NEGATIVE
Leukocytes, UA: NEGATIVE
Nitrite, UA: NEGATIVE
Protein, UA: NEGATIVE
RBC UA: NEGATIVE
UROBILINOGEN UA: 0.2 U/dL
pH, UA: 6 (ref 5.0–8.0)

## 2017-02-10 LAB — URINE CULTURE

## 2017-02-12 ENCOUNTER — Telehealth: Payer: Self-pay | Admitting: *Deleted

## 2017-02-12 ENCOUNTER — Ambulatory Visit: Payer: Self-pay | Admitting: Family Medicine

## 2017-02-12 MED ORDER — CEFDINIR 300 MG PO CAPS: 300.0000 mg | ORAL_CAPSULE | Freq: Two times a day (BID) | ORAL | 0 refills | Status: DC

## 2017-02-12 NOTE — Telephone Encounter (Signed)
-----   Message from Malva Limesonald E Fisher, MD sent at 02/12/2017  4:39 PM EDT ----- cutlure positive for e. Coli infection. Need to start cefdinir 300mg  two tablets daily for 7 days.

## 2017-02-12 NOTE — Telephone Encounter (Signed)
LMOVM for pt to return call. Rx was already sent to pharmacy.

## 2017-02-13 ENCOUNTER — Ambulatory Visit (INDEPENDENT_AMBULATORY_CARE_PROVIDER_SITE_OTHER): Payer: Medicare HMO | Admitting: Family Medicine

## 2017-02-13 ENCOUNTER — Encounter: Payer: Self-pay | Admitting: Family Medicine

## 2017-02-13 VITALS — BP 122/70 | HR 84 | Temp 98.5°F | Resp 16

## 2017-02-13 DIAGNOSIS — R5383 Other fatigue: Secondary | ICD-10-CM | POA: Diagnosis not present

## 2017-02-13 DIAGNOSIS — F329 Major depressive disorder, single episode, unspecified: Secondary | ICD-10-CM | POA: Diagnosis not present

## 2017-02-13 DIAGNOSIS — F988 Other specified behavioral and emotional disorders with onset usually occurring in childhood and adolescence: Secondary | ICD-10-CM

## 2017-02-13 DIAGNOSIS — R69 Illness, unspecified: Secondary | ICD-10-CM | POA: Diagnosis not present

## 2017-02-13 DIAGNOSIS — G894 Chronic pain syndrome: Secondary | ICD-10-CM | POA: Diagnosis not present

## 2017-02-13 DIAGNOSIS — F32A Depression, unspecified: Secondary | ICD-10-CM

## 2017-02-13 MED ORDER — OXYCODONE HCL 15 MG PO TABS: ORAL_TABLET | ORAL | 0 refills | Status: DC

## 2017-02-13 MED ORDER — OMEPRAZOLE 20 MG PO CPDR: 20.0000 mg | DELAYED_RELEASE_CAPSULE | Freq: Every day | ORAL | 12 refills | Status: DC

## 2017-02-13 MED ORDER — AMPHETAMINE-DEXTROAMPHETAMINE 15 MG PO TABS: 15.0000 mg | ORAL_TABLET | Freq: Two times a day (BID) | ORAL | 0 refills | Status: DC

## 2017-02-13 MED ORDER — METHADONE HCL 10 MG PO TABS: ORAL_TABLET | ORAL | 0 refills | Status: DC

## 2017-02-13 MED ORDER — LEVOTHYROXINE SODIUM 75 MCG PO TABS: ORAL_TABLET | ORAL | 11 refills | Status: DC

## 2017-02-13 NOTE — Progress Notes (Signed)
Patient: Justin [name] Male    DOB: 1980/11/09   36 y.o.   MRN: [id number] Visit Date: 02/13/2017  Today's Provider: Mila Merryonald Fisher, MD   Chief Complaint  Patient presents with  . Leg Pain    Muscle spasms worsening   . Depression    Better since increasing Paxil but not to goal. Pt would like a referral to Psychiatry  . Hypothyroidism    Needs TSH checked, has been off Levothyroxine for about two weeks.    Subjective:    Depression         This is a chronic problem.  The problem has been gradually improving (Improving after increasing Paxil but not to goal) since onset.  Associated symptoms include decreased concentration, fatigue, insomnia, body aches and myalgias (Muscle spasms in both legs).  Associated symptoms include no helplessness, no hopelessness, no appetite change, no headaches, no indigestion, not sad and no suicidal ideas.  Compliance with treatment is good.  Past medical history includes thyroid problem.   Thyroid Problem  Presents for follow-up visit. Symptoms include anxiety, constipation and fatigue. Patient reports no diaphoresis, diarrhea or palpitations. The symptoms have been stable (Pt reports being off is Levothyroxine for about two weeks.  He forgot to get it refilled. ).  Muscle Pain  This is a chronic problem. The current episode started more than 1 year ago. The problem occurs constantly. The problem has been gradually worsening since onset. The pain occurs in the context of an injury. Pain location: Bilateral legs. Associated symptoms include constipation, fatigue and weakness. Pertinent negatives include no abdominal pain, diarrhea, fever, headaches, joint swelling, nausea or vomiting. Past treatments include prescription narcotic. The treatment provided mild relief.   He states he has been much more fatigued over the last few months and is not sure if this is related to or caused by his underlying depression.  Lab Results  Component Value  Date   TSH 1.840 07/31/2016       Allergies  Allergen Reactions  . Azithromycin     throat closes  . Iodine   . Lorazepam     extremely hostile  . Meperidine   . Morphine Sulfate     Psychotic symptoms     Current Outpatient Prescriptions:  .  albuterol (PROVENTIL HFA;VENTOLIN HFA) 108 (90 Base) MCG/ACT inhaler, Inhale 2 puffs into the lungs every 6 (six) hours as needed for wheezing or shortness of breath., Disp: 1 Inhaler, Rfl: 0 .  alprazolam (XANAX) 2 MG tablet, TAKE ONE TABLET BY MOUTH EVERY 6 HOURS AS NEEDED, Disp: 120 tablet, Rfl: 5 .  amphetamine-dextroamphetamine (ADDERALL) 15 MG tablet, Take 1 tablet by mouth 2 (two) times daily., Disp: 60 tablet, Rfl: 0 .  baclofen (LIORESAL) 20 MG tablet, TAKE ONE TABLET FOUR TIMES DAILY, Disp: 120 each, Rfl: 12 .  cefdinir (OMNICEF) 300 MG capsule, Take 1 capsule (300 mg total) by mouth 2 (two) times daily., Disp: 14 capsule, Rfl: 0 .  clotrimazole-betamethasone (LOTRISONE) cream, Apply 1 application topically 2 (two) times daily., Disp: 30 g, Rfl: 4 .  dantrolene (DANTRIUM) 25 MG capsule, Take 1 capsule by mouth daily. Reported on 10/26/2015, Disp: , Rfl:  .  diphenhydrAMINE (BENADRYL) 25 MG tablet, Take 1 tablet by mouth as needed., Disp: , Rfl:  .  docusate sodium (COLACE) 250 MG capsule, Take 1 capsule (250 mg total) by mouth 4 (four) times daily as needed., Disp: 120 capsule, Rfl: 3 .  dronabinol (MARINOL)  10 MG capsule, Take 1 capsule (10 mg total) by mouth every 6 (six) hours as needed., Disp: 30 capsule, Rfl: 1 .  gabapentin (NEURONTIN) 800 MG tablet, Take 1 tablet (800 mg total) by mouth 4 (four) times daily., Disp: 120 tablet, Rfl: 5 .  levothyroxine (SYNTHROID, LEVOTHROID) 75 MCG tablet, TAKE ONE TABLET EVERY DAY BEFORE BREAKFAST, Disp: 30 tablet, Rfl: 5 .  lidocaine (LMX) 4 % cream, Apply 1 application topically 3 (three) times daily., Disp: 133 g, Rfl: 5 .  lubiprostone (AMITIZA) 24 MCG capsule, Take 1 capsule (24 mcg total)  by mouth 2 (two) times daily., Disp: 60 capsule, Rfl: 12 .  methadone (DOLOPHINE) 10 MG tablet, TAKE TWO TABLETS BY MOUTH EVERY 6 HOURS, Disp: 270 tablet, Rfl: 0 .  Mineral Oil OIL, daily., Disp: , Rfl:  .  naproxen (NAPROSYN) 500 MG tablet, TAKE 1 TABLET BY MOUTH TWICE DAILY, Disp: 60 tablet, Rfl: 5 .  NIFEdipine (PROCARDIA) 20 MG capsule, One tablet three times a day as needed, Disp: 60 capsule, Rfl: 1 .  omeprazole (PRILOSEC) 20 MG capsule, TAKE 1 CAPSULE EVERY DAY, Disp: 30 capsule, Rfl: 12 .  oxyCODONE (ROXICODONE) 15 MG immediate release tablet, TAKE ONE TABLET BY MOUTH EVERY 4 HOURS, Disp: 120 tablet, Rfl: 0 .  PARoxetine (PAXIL) 30 MG tablet, Take 2 tablets (60 mg total) by mouth daily., Disp: 60 tablet, Rfl: 6 .  phenazopyridine (PYRIDIUM) 200 MG tablet, Take 1 tablet by mouth 3 (three) times daily., Disp: , Rfl:  .  polyethylene glycol powder (GLYCOLAX/MIRALAX) powder, TAKE 17 GM IN 8 OZ OF WATER OR OTHER LIQUID DAILY, Disp: 255 g, Rfl: 5 .  promethazine (PHENERGAN) 25 MG tablet, Take 1 tablet (25 mg total) by mouth every 4 (four) hours as needed., Disp: 30 tablet, Rfl: 3 .  tiZANidine (ZANAFLEX) 4 MG tablet, TAKE TWO TABLETS FOUR TIMES DAILY, Disp: 240 tablet, Rfl: 5 .  silver sulfADIAZINE (SILVADENE) 1 % cream, Apply 1 application topically daily. (Patient not taking: Reported on 02/13/2017), Disp: 50 g, Rfl: 0  Review of Systems  Constitutional: Positive for fatigue. Negative for activity change, appetite change, chills, diaphoresis, fever and unexpected weight change.  Respiratory: Negative.   Cardiovascular: Negative.  Negative for palpitations.  Gastrointestinal: Positive for constipation. Negative for abdominal distention, abdominal pain, anal bleeding, blood in stool, diarrhea, nausea, rectal pain and vomiting.  Musculoskeletal: Positive for arthralgias (Hip pain), back pain and myalgias (Muscle spasms in both legs). Negative for gait problem, joint swelling, neck pain and neck  stiffness.  Neurological: Positive for weakness and numbness. Negative for dizziness, light-headedness and headaches.  Psychiatric/Behavioral: Positive for decreased concentration, depression and sleep disturbance. Negative for agitation, behavioral problems, confusion, dysphoric mood, hallucinations, self-injury and suicidal ideas. The patient is nervous/anxious and has insomnia. The patient is not hyperactive.     Social History  Substance Use Topics  . Smoking status: Former Games developer  . Smokeless tobacco: Never Used     Comment: Using Nicorette Gum  . Alcohol use 0.0 oz/week     Comment: occasionaly   Objective:   BP 122/70 (BP Location: Left Arm, Patient Position: Sitting, Cuff Size: Large)   Pulse 84   Temp 98.5 F (36.9 C) (Oral)   Resp 16  Vitals:   02/13/17 1629  BP: 122/70  Pulse: 84  Resp: 16  Temp: 98.5 F (36.9 C)  TempSrc: Oral     Physical Exam   General Appearance:    Alert, cooperative, no distress. Confined  to wheelchair due to paraplegia.   Eyes:    PERRL, conjunctiva/corneas clear, EOM's intact       Lungs:     Clear to auscultation bilaterally, respirations unlabored  Heart:    Regular rate and rhythm  Neurologic:   Awake, alert, oriented x 3. No apparent focal neurological           defect.           Assessment & Plan:     1. Attention deficit disorder, unspecified hyperactivity presence Has been fairly well controlled on Adderall today and given printed prescriptions for next 90 days.  - amphetamine-dextroamphetamine (ADDERALL) 15 MG tablet; Take 1 tablet by mouth 2 (two) times daily.  Dispense: 60 tablet; Refill: 0  2. Chronic pain associated with significant psychosocial dysfunction Stable. He requests increase in oxycodone but to muscle cramps but advised he should not continue to escalate dosages. Printed 90 day refills of current medications.  - oxyCODONE (ROXICODONE) 15 MG immediate release tablet; TAKE ONE TABLET BY MOUTH EVERY 4 HOURS   Dispense: 120 tablet; Refill: 0 - methadone (DOLOPHINE) 10 MG tablet; TAKE TWO TABLETS BY MOUTH EVERY 6 HOURS  Dispense: 270 tablet; Refill: 0  3. Depression, unspecified depression type Worsening over the last few months. He feels he is still depressed about accident and would benefit from counseling and to discuss medication adjustment with psychiatrist.  - Ambulatory referral to Psychiatry  4. Fatigue, unspecified type  - Comprehensive metabolic panel - CBC - Testosterone,Free and Total - T4 AND TSH  Addressed extensive list of chronic and acute medical problems today requiring extensive time in counseling and coordination care.  Over half of this 45 minute visit were spent in counseling and coordinating care of multiple medical problems.       Mila Merry, MD  [facility]

## 2017-02-14 NOTE — Telephone Encounter (Signed)
Patient was notified of results in office.

## 2017-02-23 ENCOUNTER — Other Ambulatory Visit: Payer: Self-pay | Admitting: Family Medicine

## 2017-03-07 DIAGNOSIS — R339 Retention of urine, unspecified: Secondary | ICD-10-CM | POA: Diagnosis not present

## 2017-03-11 ENCOUNTER — Other Ambulatory Visit: Payer: Self-pay | Admitting: Family Medicine

## 2017-03-11 DIAGNOSIS — F339 Major depressive disorder, recurrent, unspecified: Secondary | ICD-10-CM

## 2017-03-22 ENCOUNTER — Ambulatory Visit: Payer: Self-pay | Admitting: Family Medicine

## 2017-04-03 ENCOUNTER — Other Ambulatory Visit: Payer: Self-pay | Admitting: Family Medicine

## 2017-04-03 DIAGNOSIS — R339 Retention of urine, unspecified: Secondary | ICD-10-CM | POA: Diagnosis not present

## 2017-04-03 DIAGNOSIS — G8220 Paraplegia, unspecified: Secondary | ICD-10-CM

## 2017-04-03 NOTE — Telephone Encounter (Signed)
Please call in alprazolam.  

## 2017-04-04 NOTE — Telephone Encounter (Signed)
Rx called in to pharmacy. 

## 2017-04-05 ENCOUNTER — Other Ambulatory Visit: Payer: Self-pay

## 2017-04-05 DIAGNOSIS — N390 Urinary tract infection, site not specified: Secondary | ICD-10-CM

## 2017-04-05 DIAGNOSIS — T83511D Infection and inflammatory reaction due to indwelling urethral catheter, subsequent encounter: Principal | ICD-10-CM

## 2017-04-05 NOTE — Progress Notes (Signed)
Patient's mother brought in urine specimen. Urine was sent for culture. Will advise patient once results are in.

## 2017-04-07 LAB — URINE CULTURE: ORGANISM ID, BACTERIA: NO GROWTH

## 2017-05-02 ENCOUNTER — Other Ambulatory Visit: Payer: Self-pay | Admitting: Family Medicine

## 2017-05-02 DIAGNOSIS — K5909 Other constipation: Secondary | ICD-10-CM

## 2017-05-03 DIAGNOSIS — R339 Retention of urine, unspecified: Secondary | ICD-10-CM | POA: Diagnosis not present

## 2017-05-10 ENCOUNTER — Encounter: Payer: Self-pay | Admitting: Family Medicine

## 2017-05-10 ENCOUNTER — Ambulatory Visit (INDEPENDENT_AMBULATORY_CARE_PROVIDER_SITE_OTHER): Payer: Medicare HMO | Admitting: Family Medicine

## 2017-05-10 VITALS — BP 120/64 | HR 92 | Temp 98.7°F | Resp 16

## 2017-05-10 DIAGNOSIS — M25521 Pain in right elbow: Secondary | ICD-10-CM | POA: Diagnosis not present

## 2017-05-10 DIAGNOSIS — L304 Erythema intertrigo: Secondary | ICD-10-CM | POA: Diagnosis not present

## 2017-05-10 DIAGNOSIS — F988 Other specified behavioral and emotional disorders with onset usually occurring in childhood and adolescence: Secondary | ICD-10-CM | POA: Diagnosis not present

## 2017-05-10 DIAGNOSIS — R69 Illness, unspecified: Secondary | ICD-10-CM | POA: Diagnosis not present

## 2017-05-10 DIAGNOSIS — M7711 Lateral epicondylitis, right elbow: Secondary | ICD-10-CM

## 2017-05-10 DIAGNOSIS — G894 Chronic pain syndrome: Secondary | ICD-10-CM

## 2017-05-10 MED ORDER — AMPHETAMINE-DEXTROAMPHETAMINE 15 MG PO TABS: 15.0000 mg | ORAL_TABLET | Freq: Two times a day (BID) | ORAL | 0 refills | Status: DC

## 2017-05-10 MED ORDER — METHADONE HCL 10 MG PO TABS: ORAL_TABLET | ORAL | 0 refills | Status: DC

## 2017-05-10 MED ORDER — METHYLPREDNISOLONE ACETATE 40 MG/ML IJ SUSP: 40.0000 mg | Freq: Once | INTRAMUSCULAR | Status: DC

## 2017-05-10 MED ORDER — OXYCODONE HCL 15 MG PO TABS: ORAL_TABLET | ORAL | 0 refills | Status: DC

## 2017-05-10 NOTE — Progress Notes (Signed)
Patient: Justin [name] Male    DOB: 28-Jun-1981   36 y.o.   MRN: [id number] Visit Date: 05/10/2017  Today's Provider: Mila Merry, MD   Chief Complaint  Patient presents with  . Elbow Pain    x 1 month   Subjective:    HPI Elbow pain:  Patient comes in complaining of pain in his right elbow for the past month. Patient states pain worsens when lifting objects. Patient denies any swelling or redness. Elbow pain offurs intermittently and is unchanged since onset   He is also here for follow up of pain management and ADD. He states he still has daily pain in his backs and legs, but is tolerable on current medications. Marland Kitchen He feels his attention and focus have been better on Adderall and desires to continue the same dose.     Allergies  Allergen Reactions  . Azithromycin     throat closes  . Iodine   . Lorazepam     extremely hostile  . Meperidine   . Morphine Sulfate     Psychotic symptoms     Current Outpatient Prescriptions:  .  albuterol (PROVENTIL HFA;VENTOLIN HFA) 108 (90 Base) MCG/ACT inhaler, Inhale 2 puffs into the lungs every 6 (six) hours as needed for wheezing or shortness of breath., Disp: 1 Inhaler, Rfl: 0 .  alprazolam (XANAX) 2 MG tablet, TAKE ONE TABLET BY MOUTH EVERY 6 HOURS AS NEEDED, Disp: 120 tablet, Rfl: 5 .  AMITIZA 24 MCG capsule, TAKE 1 CAPSULE BY MOUTH TWICE DAILY, Disp: 60 capsule, Rfl: 5 .  amphetamine-dextroamphetamine (ADDERALL) 15 MG tablet, Take 1 tablet by mouth 2 (two) times daily., Disp: 60 tablet, Rfl: 0 .  baclofen (LIORESAL) 20 MG tablet, TAKE ONE TABLET FOUR TIMES DAILY, Disp: 120 each, Rfl: 12 .  dantrolene (DANTRIUM) 25 MG capsule, Take 1 capsule by mouth daily. Reported on 10/26/2015, Disp: , Rfl:  .  diphenhydrAMINE (BENADRYL) 25 MG tablet, Take 1 tablet by mouth as needed., Disp: , Rfl:  .  docusate sodium (COLACE) 250 MG capsule, Take 1 capsule (250 mg total) by mouth 4 (four) times daily as needed., Disp: 120 capsule,  Rfl: 3 .  dronabinol (MARINOL) 10 MG capsule, Take 1 capsule (10 mg total) by mouth every 6 (six) hours as needed., Disp: 30 capsule, Rfl: 1 .  gabapentin (NEURONTIN) 800 MG tablet, Take 1 tablet (800 mg total) by mouth 4 (four) times daily., Disp: 120 tablet, Rfl: 5 .  levothyroxine (SYNTHROID, LEVOTHROID) 75 MCG tablet, TAKE ONE TABLET EVERY DAY BEFORE BREAKFAST, Disp: 30 tablet, Rfl: 11 .  lidocaine (LMX) 4 % cream, Apply 1 application topically 3 (three) times daily., Disp: 133 g, Rfl: 5 .  methadone (DOLOPHINE) 10 MG tablet, TAKE TWO TABLETS BY MOUTH EVERY 6 HOURS, Disp: 270 tablet, Rfl: 0 .  Mineral Oil OIL, daily., Disp: , Rfl:  .  naproxen (NAPROSYN) 500 MG tablet, TAKE 1 TABLET BY MOUTH TWICE DAILY, Disp: 60 tablet, Rfl: 5 .  NIFEdipine (PROCARDIA) 20 MG capsule, One tablet three times a day as needed, Disp: 60 capsule, Rfl: 1 .  omeprazole (PRILOSEC) 20 MG capsule, Take 1 capsule (20 mg total) by mouth daily., Disp: 30 capsule, Rfl: 12 .  oxyCODONE (ROXICODONE) 15 MG immediate release tablet, TAKE ONE TABLET BY MOUTH EVERY 4 HOURS, Disp: 120 tablet, Rfl: 0 .  PARoxetine (PAXIL) 30 MG tablet, TAKE TWO TABLETS BY MOUTH EVERY DAY, Disp: 60 tablet, Rfl: 12 .  phenazopyridine (PYRIDIUM) 200 MG tablet, Take 1 tablet by mouth 3 (three) times daily., Disp: , Rfl:  .  polyethylene glycol powder (GLYCOLAX/MIRALAX) powder, TAKE 17 GRAMS IN 8 OZ OF WATER OR OTHER LIQUID DAILY, Disp: 527 g, Rfl: 4 .  promethazine (PHENERGAN) 25 MG tablet, TAKE ONE TABLET EVERY 4 HOURS AS NEEDED FOR NAUSEA / VOMITING, Disp: 30 tablet, Rfl: 5 .  silver sulfADIAZINE (SILVADENE) 1 % cream, Apply 1 application topically daily., Disp: 50 g, Rfl: 0 .  tiZANidine (ZANAFLEX) 4 MG tablet, TAKE TWO TABLETS BY MOUTH FOUR TIMES DAILY, Disp: 240 tablet, Rfl: 3 .  clotrimazole-betamethasone (LOTRISONE) cream, Apply 1 application topically 2 (two) times daily. (Patient not taking: Reported on 05/10/2017), Disp: 30 g, Rfl: 4  Review  of Systems  Constitutional: Negative for appetite change, chills and fever.  Respiratory: Negative for chest tightness, shortness of breath and wheezing.   Cardiovascular: Negative for chest pain and palpitations.  Gastrointestinal: Negative for abdominal pain, nausea and vomiting.  Musculoskeletal: Positive for arthralgias (right elbow pain).    Social History  Substance Use Topics  . Smoking status: Former Games developer  . Smokeless tobacco: Never Used     Comment: Using Nicorette Gum  . Alcohol use 0.0 oz/week     Comment: occasionaly-rare   Objective:   BP 120/64 (BP Location: Right Arm, Patient Position: Sitting, Cuff Size: Large)   Pulse 92   Temp 98.7 F (37.1 C) (Oral)   Resp 16   SpO2 96% Comment: room air    Physical Exam   General Appearance:    Alert, cooperative, no distress  Eyes:    PERRL, conjunctiva/corneas clear, EOM's intact       Lungs:     Clear to auscultation bilaterally, respirations unlabored  Heart:    Regular rate and rhythm  MS:   Mild tenderness of lateral epicondyle. No swelling or erythema. .Pain with supination against resistance and with abduction.           Assessment & Plan:     1. Elbow pain, right   2. Lateral epicondylitis of right elbow Prepped with isopropyl alcohol and injected 1 cc 40mg /ml depot-medrol with 1 cc 1% lidocaine without epinephrine over lateral epicondyle. Patient tolerated well.   3. Chronic pain associated with significant psychosocial dysfunction Doing well current medications.  - oxyCODONE (ROXICODONE) 15 MG immediate release tablet; TAKE ONE TABLET BY MOUTH EVERY 4 HOURS  Dispense: 120 tablet; Refill: 0 - methadone (DOLOPHINE) 10 MG tablet; TAKE TWO TABLETS BY MOUTH EVERY 6 HOURS  Dispense: 270 tablet; Refill: 0  4. Attention deficit disorder, unspecified hyperactivity presence Continue current dose of Adderall.  - amphetamine-dextroamphetamine (ADDERALL) 15 MG tablet; Take 1 tablet by mouth 2 (two) times daily.   Dispense: 60 tablet; Refill: 0  5. Intertrigo He also asked that I look at irritated area midline of upper buttocks. He has been using OTC antifungal cream. It is very faint dull red which he states is much improved from a few days ago. Advised to continue using until completely healed.       Mila Merry, MD  [facility]

## 2017-06-03 DIAGNOSIS — R339 Retention of urine, unspecified: Secondary | ICD-10-CM | POA: Diagnosis not present

## 2017-06-12 ENCOUNTER — Other Ambulatory Visit: Payer: Self-pay

## 2017-06-12 DIAGNOSIS — N390 Urinary tract infection, site not specified: Secondary | ICD-10-CM | POA: Diagnosis not present

## 2017-06-13 LAB — URINE CULTURE
MICRO NUMBER:: 81067764
RESULT: NO GROWTH
SPECIMEN QUALITY:: ADEQUATE

## 2017-07-02 DIAGNOSIS — R339 Retention of urine, unspecified: Secondary | ICD-10-CM | POA: Diagnosis not present

## 2017-07-19 ENCOUNTER — Other Ambulatory Visit: Payer: Self-pay

## 2017-07-19 DIAGNOSIS — N390 Urinary tract infection, site not specified: Secondary | ICD-10-CM | POA: Diagnosis not present

## 2017-07-23 LAB — URINE CULTURE
MICRO NUMBER:: 81234319
SPECIMEN QUALITY: ADEQUATE

## 2017-07-24 ENCOUNTER — Telehealth: Payer: Self-pay | Admitting: Family Medicine

## 2017-07-24 DIAGNOSIS — N390 Urinary tract infection, site not specified: Secondary | ICD-10-CM

## 2017-07-24 NOTE — Telephone Encounter (Signed)
Pt is returning call.  CB#[phone]/MW

## 2017-07-26 MED ORDER — AMOXICILLIN-POT CLAVULANATE 875-125 MG PO TABS: 1.0000 | ORAL_TABLET | Freq: Two times a day (BID) | ORAL | 0 refills | Status: DC

## 2017-07-26 NOTE — Telephone Encounter (Signed)
Please advise 

## 2017-07-26 NOTE — Telephone Encounter (Signed)
He needs referral to infectious disease. Can change antibiotic to Augmentin 875 twice a day x 7 days while waiting to get in with ID. It is partially resistant to Augmentin. It is fully resistant to everything else.

## 2017-07-26 NOTE — Telephone Encounter (Signed)
Patient was notified. Referral to infectious disease ordered. Rx sent to pharmacy.

## 2017-07-26 NOTE — Telephone Encounter (Signed)
Patient was advised of urine culture report,he states that since Cipro is resistant to E. Coli is there another antibiotic that can be prescribed in its place? Patient also states that it will take him 6 months to get in with Urology. Patient is requesting a referral to urology ASAP.Please advise. KW

## 2017-07-26 NOTE — Telephone Encounter (Signed)
Please schedule referral to infectious disease. Thanks!

## 2017-07-29 ENCOUNTER — Telehealth: Payer: Self-pay | Admitting: Family Medicine

## 2017-07-29 DIAGNOSIS — N390 Urinary tract infection, site not specified: Secondary | ICD-10-CM

## 2017-07-29 DIAGNOSIS — R339 Retention of urine, unspecified: Secondary | ICD-10-CM | POA: Diagnosis not present

## 2017-07-29 NOTE — Telephone Encounter (Signed)
Per Dr Sampson GoonFitzgerald he is unable to see pt this week and suggest that pt be started on fosfomycin 3 g orally every other day x 3 doses if pt has symptoms.There are no recent results for urinalysis.He will need urinalysis and urine culture done at same time but feels pt should f/u with urologist See note from his office

## 2017-07-29 NOTE — Telephone Encounter (Signed)
Please advise patient need to repeat urinalysis and urine culture this week. Continue Augmentin for now.   He needs referral to a LOCAL urologist that he can see on short notice due to recurrent multi drug resistant urinary tract infections.

## 2017-07-30 ENCOUNTER — Other Ambulatory Visit (INDEPENDENT_AMBULATORY_CARE_PROVIDER_SITE_OTHER): Payer: Medicare HMO | Admitting: *Deleted

## 2017-07-30 DIAGNOSIS — N390 Urinary tract infection, site not specified: Secondary | ICD-10-CM | POA: Diagnosis not present

## 2017-07-30 LAB — POCT URINALYSIS DIPSTICK
BILIRUBIN UA: NEGATIVE
Blood, UA: NEGATIVE
GLUCOSE UA: NEGATIVE
Ketones, UA: NEGATIVE
NITRITE UA: NEGATIVE
PH UA: 6.5 (ref 5.0–8.0)
Spec Grav, UA: 1.02 (ref 1.010–1.025)
Urobilinogen, UA: 0.2 E.U./dL

## 2017-07-30 NOTE — Telephone Encounter (Signed)
Please schedule referral to local Urology. Thanks!

## 2017-07-30 NOTE — Telephone Encounter (Signed)
Patient was advised.  

## 2017-07-31 LAB — URINE CULTURE
MICRO NUMBER:: 81279097
Result:: NO GROWTH
SPECIMEN QUALITY: ADEQUATE

## 2017-08-01 ENCOUNTER — Telehealth: Payer: Self-pay

## 2017-08-01 MED ORDER — AMOXICILLIN-POT CLAVULANATE 875-125 MG PO TABS: 1.0000 | ORAL_TABLET | Freq: Two times a day (BID) | ORAL | 0 refills | Status: DC

## 2017-08-01 NOTE — Telephone Encounter (Signed)
-----   Message from Malva Limesonald E Fisher, MD sent at 08/01/2017  7:52 AM EST ----- Urine culture is negative. Need to follow up with urology due to frequent UTIs.

## 2017-08-01 NOTE — Telephone Encounter (Signed)
Patient advised as directed below. Patient is requesting extra days of antibiotic. Reports that he only has one more day left but would like some more before he goes to the Urology appointment he has scheduled on 11/26.  Thanks,  -Rennie Rouch

## 2017-08-01 NOTE — Telephone Encounter (Signed)
Please advise 

## 2017-08-09 NOTE — Progress Notes (Signed)
08/12/2017 4:44 PM   Justin [name] 1980/10/11 [id number]  Referring provider: Birdie Sons, [place] [address] [place] [address], [place] [address]  Chief Complaint  Patient presents with  . Urinary Tract Infection    HPI: Patient is a 36 -year-old Caucasian male with paraplegia and a neurogenic bladder managed by [facility] who is referred to [place] by, Dr.Fisher, for recurrent urinary tract infections.  Patient states that he has had two to five urinary tract infections over the last year.  Reviewing his records, he has had one to two documented infections on a yearly basis.  His symptoms with a urinary tract infection consist of dysuria, leg spasms, incontinence, cloudy urine and foul odor.    He is straight cathing up to 6 times daily.  Patient has an urge to void.  He is going through one to two briefs daily.    He denied dysuria, gross hematuria, suprapubic pain, back pain, abdominal pain or flank pain at this time.  He has not had any recent fevers, chills, nausea or vomiting.   He does not have a history of nephrolithiasis, GU surgery or GU trauma.   He is sexually active.   He admits to constipation.   He is taking Miralax daily.    She does engage in good perineal hygiene. He does not take tub baths.   He has incontinence.  He is using incontinence pads.  He states that his former urologist could performed a surgery and he would never leak again.  He could not recall what type of surgery that would be.    He is not having pain with bladder filling.    He has not had any recent imaging studies.    He is drinking 2 to 3 sixteen ounce bottles of water daily.  Cranberry tablet everyday.  Vitamin C daily.  He is eating yogurt daily.    He and his wife are wanting to have children.  He would like to have a semen analysis, but he cannot climax.     Reviewed referral notes.    PMH: Past Medical History:  Diagnosis Date  . Arthritis   . Asthma   . Chronic  anxiety   . Chronic depression   . Chronic pain syndrome    secondary to fracture T9 and L3 with extensive spinal surgeery  . Postoperative wound infection of right hip 2012    Surgical History: Past Surgical History:  Procedure Laterality Date  . BACK SURGERY  2000   Fracture T9 and L3 with paraplegia    Home Medications:  Allergies as of 08/12/2017      Reactions   Azithromycin    throat closes   Iodine    Lorazepam    extremely hostile   Meperidine    Morphine Sulfate    Psychotic symptoms      Medication List        Accurate as of 08/12/17  4:44 PM. Always use your most recent med list.          albuterol 108 (90 Base) MCG/ACT inhaler Commonly known as:  PROVENTIL HFA;VENTOLIN HFA Inhale 2 puffs into the lungs every 6 (six) hours as needed for wheezing or shortness of breath.   alprazolam 2 MG tablet Commonly known as:  XANAX TAKE ONE TABLET BY MOUTH EVERY 6 HOURS AS NEEDED   AMITIZA 24 MCG capsule Generic drug:  lubiprostone TAKE 1 CAPSULE BY MOUTH TWICE DAILY   amphetamine-dextroamphetamine 15 MG tablet  Commonly known as:  ADDERALL Take 1 tablet by mouth 2 (two) times daily.   baclofen 20 MG tablet Commonly known as:  LIORESAL TAKE ONE TABLET FOUR TIMES DAILY   clotrimazole-betamethasone cream Commonly known as:  LOTRISONE Apply 1 application topically 2 (two) times daily.   dantrolene 25 MG capsule Commonly known as:  DANTRIUM Take 1 capsule by mouth daily. Reported on 10/26/2015   diphenhydrAMINE 25 MG tablet Commonly known as:  BENADRYL Take 1 tablet by mouth as needed.   docusate sodium 250 MG capsule Commonly known as:  COLACE Take 1 capsule (250 mg total) by mouth 4 (four) times daily as needed.   dronabinol 10 MG capsule Commonly known as:  MARINOL Take 1 capsule (10 mg total) by mouth every 6 (six) hours as needed.   gabapentin 800 MG tablet Commonly known as:  NEURONTIN Take 1 tablet (800 mg total) by mouth 4 (four) times  daily.   levothyroxine 75 MCG tablet Commonly known as:  SYNTHROID, LEVOTHROID TAKE ONE TABLET EVERY DAY BEFORE BREAKFAST   lidocaine 4 % cream Commonly known as:  LMX Apply 1 application topically 3 (three) times daily.   methadone 10 MG tablet Commonly known as:  DOLOPHINE TAKE TWO TABLETS BY MOUTH EVERY 6 HOURS   Mineral Oil Oil daily.   naproxen 500 MG tablet Commonly known as:  NAPROSYN TAKE 1 TABLET BY MOUTH TWICE DAILY   NIFEdipine 20 MG capsule Commonly known as:  PROCARDIA One tablet three times a day as needed   omeprazole 20 MG capsule Commonly known as:  PRILOSEC Take 1 capsule (20 mg total) by mouth daily.   oxyCODONE 15 MG immediate release tablet Commonly known as:  ROXICODONE TAKE ONE TABLET BY MOUTH EVERY 4 HOURS   PARoxetine 30 MG tablet Commonly known as:  PAXIL TAKE TWO TABLETS BY MOUTH EVERY DAY   phenazopyridine 200 MG tablet Commonly known as:  PYRIDIUM Take 1 tablet by mouth 3 (three) times daily.   polyethylene glycol powder powder Commonly known as:  GLYCOLAX/MIRALAX TAKE 17 GRAMS IN 8 OZ OF WATER OR OTHER LIQUID DAILY   promethazine 25 MG tablet Commonly known as:  PHENERGAN TAKE ONE TABLET EVERY 4 HOURS AS NEEDED FOR NAUSEA / VOMITING   sildenafil 20 MG tablet Commonly known as:  REVATIO Take 3 to 5 tablets two hours before intercouse on an empty stomach.  Do not take with nitrates.   silver sulfADIAZINE 1 % cream Commonly known as:  SILVADENE Apply 1 application topically daily.   tiZANidine 4 MG tablet Commonly known as:  ZANAFLEX TAKE TWO TABLETS FOUR TIMES DAILY   VESICARE 10 MG tablet Generic drug:  solifenacin       Allergies:  Allergies  Allergen Reactions  . Azithromycin     throat closes  . Iodine   . Lorazepam     extremely hostile  . Meperidine   . Morphine Sulfate     Psychotic symptoms    Family History: Family History  Problem Relation Age of Onset  . Hypertension Father   . Gout Father   .  Prostate cancer Neg Hx   . Kidney cancer Neg Hx     Social History:  reports that he has quit smoking. he has never used smokeless tobacco. He reports that he drinks alcohol. He reports that he does not use drugs.  ROS: UROLOGY Frequent Urination?: Yes Hard to postpone urination?: Yes Burning/pain with urination?: No Get up at night to urinate?: No  Leakage of urine?: Yes Urine stream starts and stops?: No Trouble starting stream?: No Do you have to strain to urinate?: No Blood in urine?: No Urinary tract infection?: Yes Sexually transmitted disease?: No Injury to kidneys or bladder?: No Painful intercourse?: No Weak stream?: No Erection problems?: Yes Penile pain?: No  Gastrointestinal Nausea?: Yes Vomiting?: No Indigestion/heartburn?: Yes Diarrhea?: No Constipation?: Yes  Constitutional Fever: Yes Night sweats?: Yes Weight loss?: No Fatigue?: Yes  Skin Skin rash/lesions?: No Itching?: No  Eyes Blurred vision?: No Double vision?: No  Ears/Nose/Throat Sore throat?: Yes Sinus problems?: Yes  Hematologic/Lymphatic Swollen glands?: No Easy bruising?: No  Cardiovascular Leg swelling?: Yes Chest pain?: No  Respiratory Cough?: No Shortness of breath?: Yes  Endocrine Excessive thirst?: No  Musculoskeletal Back pain?: Yes Joint pain?: Yes  Neurological Headaches?: Yes Dizziness?: No  Psychologic Depression?: Yes Anxiety?: Yes  Physical Exam: BP 118/68   Pulse 73   Constitutional: Well nourished. Alert and oriented, No acute distress. HEENT: [place] AT, moist mucus membranes. Trachea midline, no masses. Cardiovascular: No clubbing, cyanosis, or edema. Respiratory: Normal respiratory effort, no increased work of breathing. GI: Abdomen is soft, non tender, non distended, no abdominal masses. Liver and spleen not palpable.  No hernias appreciated.  Stool sample for occult testing is not indicated.   GU: No CVA tenderness.  No bladder fullness or  masses.   Skin: No rashes, bruises or suspicious lesions. Lymph: No cervical or inguinal adenopathy. Neurologic: Grossly intact, no focal deficits, moving all 4 extremities. Psychiatric: Normal mood and affect.  Assessment & Plan:    1. Recurrent UTI's  - criteria for recurrent UTI has been met with 2 or more infections in 6 months or 3 or greater infections in one year   - Patient is instructed to increase their water intake until the urine is pale yellow or clear (10 to 12 cups daily) - good water intake  - encouraged the patient to probiotics (yogurt, oral pills or vaginal suppositories), take cranberry pills or drink the juice and Vitamin C 1,000 mg daily to acidify the urine   - will schedule RUS and cystoscopy to rule out possible nidus for his infections  2. Neurogenic bladder  - continue [facility]  - see above  3. Infertility   - will refer to [facility] for further evaluation     4. ED  - refill sildenafil given                         Return for cystoscopy.  These notes generated with voice recognition software. I apologize for typographical errors.  Zara Council, [facility] [address] [place], [place] [address] [phone]

## 2017-08-10 ENCOUNTER — Other Ambulatory Visit: Payer: Self-pay | Admitting: Family Medicine

## 2017-08-10 DIAGNOSIS — G894 Chronic pain syndrome: Secondary | ICD-10-CM

## 2017-08-10 DIAGNOSIS — G8220 Paraplegia, unspecified: Secondary | ICD-10-CM

## 2017-08-12 ENCOUNTER — Ambulatory Visit: Payer: Medicare HMO | Admitting: Urology

## 2017-08-12 ENCOUNTER — Encounter: Payer: Self-pay | Admitting: Urology

## 2017-08-12 ENCOUNTER — Telehealth: Payer: Self-pay | Admitting: Urology

## 2017-08-12 VITALS — BP 118/68 | HR 73

## 2017-08-12 DIAGNOSIS — N529 Male erectile dysfunction, unspecified: Secondary | ICD-10-CM | POA: Diagnosis not present

## 2017-08-12 DIAGNOSIS — N390 Urinary tract infection, site not specified: Secondary | ICD-10-CM

## 2017-08-12 DIAGNOSIS — N469 Male infertility, unspecified: Secondary | ICD-10-CM

## 2017-08-12 DIAGNOSIS — N319 Neuromuscular dysfunction of bladder, unspecified: Secondary | ICD-10-CM

## 2017-08-12 MED ORDER — SILDENAFIL CITRATE 20 MG PO TABS: ORAL_TABLET | ORAL | 3 refills | Status: DC

## 2017-08-12 NOTE — Telephone Encounter (Signed)
Would you refer this patient to [facility], [phone]?  He is paraplegic and cannot climax.  He and his wife would like to have children in the future.

## 2017-08-13 NOTE — Telephone Encounter (Signed)
Order placed

## 2017-08-26 ENCOUNTER — Ambulatory Visit: Payer: Medicare HMO

## 2017-08-28 DIAGNOSIS — R339 Retention of urine, unspecified: Secondary | ICD-10-CM | POA: Diagnosis not present

## 2017-08-29 ENCOUNTER — Encounter: Payer: Self-pay | Admitting: Urology

## 2017-08-29 ENCOUNTER — Other Ambulatory Visit: Payer: Self-pay | Admitting: Urology

## 2017-08-31 ENCOUNTER — Other Ambulatory Visit: Payer: Self-pay | Admitting: Family Medicine

## 2017-08-31 DIAGNOSIS — G894 Chronic pain syndrome: Secondary | ICD-10-CM

## 2017-09-02 ENCOUNTER — Ambulatory Visit: Payer: Medicare HMO

## 2017-09-28 ENCOUNTER — Other Ambulatory Visit: Payer: Self-pay | Admitting: Family Medicine

## 2017-09-30 DIAGNOSIS — R339 Retention of urine, unspecified: Secondary | ICD-10-CM | POA: Diagnosis not present

## 2017-10-09 ENCOUNTER — Telehealth: Payer: Self-pay | Admitting: Family Medicine

## 2017-10-09 DIAGNOSIS — G894 Chronic pain syndrome: Secondary | ICD-10-CM

## 2017-10-09 MED ORDER — OXYCODONE HCL 15 MG PO TABS: 15.0000 mg | ORAL_TABLET | Freq: Four times a day (QID) | ORAL | 0 refills | Status: DC | PRN

## 2017-10-09 NOTE — Telephone Encounter (Signed)
Patient is requesting refill on his oxyCODONE (ROXICODONE) 15 MG immediate release tablet

## 2017-10-22 ENCOUNTER — Encounter: Payer: Self-pay | Admitting: Family Medicine

## 2017-10-22 ENCOUNTER — Ambulatory Visit (INDEPENDENT_AMBULATORY_CARE_PROVIDER_SITE_OTHER): Payer: Medicare HMO | Admitting: Family Medicine

## 2017-10-22 VITALS — BP 126/80 | HR 86 | Temp 98.1°F | Resp 16

## 2017-10-22 DIAGNOSIS — G8220 Paraplegia, unspecified: Secondary | ICD-10-CM

## 2017-10-22 DIAGNOSIS — N319 Neuromuscular dysfunction of bladder, unspecified: Secondary | ICD-10-CM

## 2017-10-22 DIAGNOSIS — N390 Urinary tract infection, site not specified: Secondary | ICD-10-CM | POA: Diagnosis not present

## 2017-10-22 DIAGNOSIS — Z136 Encounter for screening for cardiovascular disorders: Secondary | ICD-10-CM | POA: Diagnosis not present

## 2017-10-22 DIAGNOSIS — J069 Acute upper respiratory infection, unspecified: Secondary | ICD-10-CM | POA: Diagnosis not present

## 2017-10-22 DIAGNOSIS — F988 Other specified behavioral and emotional disorders with onset usually occurring in childhood and adolescence: Secondary | ICD-10-CM

## 2017-10-22 DIAGNOSIS — F32A Depression, unspecified: Secondary | ICD-10-CM

## 2017-10-22 DIAGNOSIS — R69 Illness, unspecified: Secondary | ICD-10-CM | POA: Diagnosis not present

## 2017-10-22 DIAGNOSIS — F1190 Opioid use, unspecified, uncomplicated: Secondary | ICD-10-CM

## 2017-10-22 DIAGNOSIS — Z23 Encounter for immunization: Secondary | ICD-10-CM

## 2017-10-22 DIAGNOSIS — G894 Chronic pain syndrome: Secondary | ICD-10-CM | POA: Diagnosis not present

## 2017-10-22 DIAGNOSIS — F329 Major depressive disorder, single episode, unspecified: Secondary | ICD-10-CM

## 2017-10-22 DIAGNOSIS — I10 Essential (primary) hypertension: Secondary | ICD-10-CM | POA: Diagnosis not present

## 2017-10-22 DIAGNOSIS — E039 Hypothyroidism, unspecified: Secondary | ICD-10-CM | POA: Diagnosis not present

## 2017-10-22 DIAGNOSIS — R5383 Other fatigue: Secondary | ICD-10-CM

## 2017-10-22 DIAGNOSIS — L03113 Cellulitis of right upper limb: Secondary | ICD-10-CM | POA: Diagnosis not present

## 2017-10-22 DIAGNOSIS — F419 Anxiety disorder, unspecified: Secondary | ICD-10-CM

## 2017-10-22 LAB — POCT URINALYSIS DIPSTICK
BILIRUBIN UA: NEGATIVE
Glucose, UA: NEGATIVE
Ketones, UA: NEGATIVE
Leukocytes, UA: NEGATIVE
Nitrite, UA: NEGATIVE
Protein, UA: 30
Spec Grav, UA: 1.02 (ref 1.010–1.025)
Urobilinogen, UA: 0.2 E.U./dL
pH, UA: 6.5 (ref 5.0–8.0)

## 2017-10-22 MED ORDER — AMPHETAMINE-DEXTROAMPHETAMINE 15 MG PO TABS: 15.0000 mg | ORAL_TABLET | Freq: Two times a day (BID) | ORAL | 0 refills | Status: DC

## 2017-10-22 MED ORDER — CEPHALEXIN 500 MG PO CAPS: 500.0000 mg | ORAL_CAPSULE | Freq: Four times a day (QID) | ORAL | 0 refills | Status: AC

## 2017-10-22 MED ORDER — ALBUTEROL SULFATE HFA 108 (90 BASE) MCG/ACT IN AERS: 2.0000 | INHALATION_SPRAY | Freq: Four times a day (QID) | RESPIRATORY_TRACT | 0 refills | Status: DC | PRN

## 2017-10-22 MED ORDER — FLUTICASONE PROPIONATE 50 MCG/ACT NA SUSP: 2.0000 | Freq: Every day | NASAL | 6 refills | Status: DC

## 2017-10-22 NOTE — Progress Notes (Signed)
Patient: Justin RinneJoseph Nathaniel [name] Male    DOB: 15-Nov-1980   37 y.o.   MRN: [id number] Visit Date: 10/22/2017  Today's Provider: Mila Merryonald Kameria Canizares, MD   Chief Complaint  Patient presents with  . Hypothyroidism  . Pain  . Depression   Subjective:    HPI  Hypothyroidism, unspecified type From 02/13/2017-labs ordered but not done. He states he is taking levothyroxine consistently but has been more fatigued for last few months.  Lab Results  Component Value Date   TSH 1.840 07/31/2016     Chronic pain associated with significant psychosocial dysfunction From 05/10/2017-no changes. Doing well current medications. States that pain level has been stable and tolerable on current regiment for several years and wishes to continue unchanged.   Attention deficit disorder, unspecified hyperactivity presence From 05/10/2017-no changes. Continued same dose of Adderall. Feels that this is hellping with focus and concentration and wishes to continue same dose.   Depression, unspecified depression type From 02/13/2017- Referred to Psychiatry. Patient reports that he has not heard from the psychiatrist yet. He feels that the Paxil has not helped him emotionally. Have increased paroxetine to 2x30mg  daily, but doesn't feel it has helped much.   Leg spasms Patient feels that his leg spasms have worsened in the last week. He thinks that he may have a UTI.  URI Patient has also had cough, congestion, and PND for about 1 week that does not seem to be getting better. He also reports that he has been using OTC cough drops with minimal relief.   Has also had sore spot on back of right elbow for a few weeks and wife just recently noticed has been red. Has had a lot more leg spasms which he states is usually triggered by infection, which makes him think he may have UTI.      Allergies  Allergen Reactions  . Azithromycin     throat closes  . Iodine   . Lorazepam     extremely hostile  .  Meperidine   . Morphine Sulfate     Psychotic symptoms     Current Outpatient Medications:  .  albuterol (PROVENTIL HFA;VENTOLIN HFA) 108 (90 Base) MCG/ACT inhaler, Inhale 2 puffs into the lungs every 6 (six) hours as needed for wheezing or shortness of breath., Disp: 1 Inhaler, Rfl: 0 .  alprazolam (XANAX) 2 MG tablet, TAKE ONE TABLET EVERY 6 HOURS AS NEEDED, Disp: 120 tablet, Rfl: 5 .  AMITIZA 24 MCG capsule, TAKE 1 CAPSULE BY MOUTH TWICE DAILY, Disp: 60 capsule, Rfl: 5 .  amphetamine-dextroamphetamine (ADDERALL) 15 MG tablet, Take 1 tablet by mouth 2 (two) times daily., Disp: 60 tablet, Rfl: 0 .  baclofen (LIORESAL) 20 MG tablet, TAKE ONE TABLET FOUR TIMES DAILY, Disp: 120 each, Rfl: 12 .  clotrimazole-betamethasone (LOTRISONE) cream, Apply 1 application topically 2 (two) times daily., Disp: 30 g, Rfl: 4 .  dantrolene (DANTRIUM) 25 MG capsule, Take 1 capsule by mouth daily. Reported on 10/26/2015, Disp: , Rfl:  .  diphenhydrAMINE (BENADRYL) 25 MG tablet, Take 1 tablet by mouth as needed., Disp: , Rfl:  .  docusate sodium (COLACE) 250 MG capsule, Take 1 capsule (250 mg total) by mouth 4 (four) times daily as needed., Disp: 120 capsule, Rfl: 3 .  dronabinol (MARINOL) 10 MG capsule, Take 1 capsule (10 mg total) by mouth every 6 (six) hours as needed., Disp: 30 capsule, Rfl: 1 .  gabapentin (NEURONTIN) 800 MG tablet, Take 1 tablet (800  mg total) by mouth 4 (four) times daily., Disp: 120 tablet, Rfl: 5 .  levothyroxine (SYNTHROID, LEVOTHROID) 75 MCG tablet, TAKE ONE TABLET EVERY DAY BEFORE BREAKFAST, Disp: 30 tablet, Rfl: 11 .  lidocaine (LMX) 4 % cream, Apply 1 application topically 3 (three) times daily., Disp: 133 g, Rfl: 5 .  methadone (DOLOPHINE) 10 MG tablet, TAKE TWO TABLETS BY MOUTH EVERY 6 HOURS, Disp: 270 tablet, Rfl: 0 .  Mineral Oil OIL, daily., Disp: , Rfl:  .  naproxen (NAPROSYN) 500 MG tablet, TAKE 1 TABLET BY MOUTH TWICE DAILY, Disp: 60 tablet, Rfl: 5 .  NIFEdipine (PROCARDIA) 20  MG capsule, One tablet three times a day as needed, Disp: 60 capsule, Rfl: 1 .  omeprazole (PRILOSEC) 20 MG capsule, Take 1 capsule (20 mg total) by mouth daily., Disp: 30 capsule, Rfl: 12 .  oxyCODONE (ROXICODONE) 15 MG immediate release tablet, Take 1 tablet (15 mg total) by mouth every 6 (six) hours as needed for pain., Disp: 120 tablet, Rfl: 0 .  PARoxetine (PAXIL) 30 MG tablet, TAKE TWO TABLETS BY MOUTH EVERY DAY, Disp: 60 tablet, Rfl: 12 .  phenazopyridine (PYRIDIUM) 200 MG tablet, Take 1 tablet by mouth 3 (three) times daily., Disp: , Rfl:  .  polyethylene glycol powder (GLYCOLAX/MIRALAX) powder, TAKE 17 GRAMS IN 8 OZ OF WATER OR OTHER LIQUID DAILY, Disp: 527 g, Rfl: 4 .  promethazine (PHENERGAN) 25 MG tablet, TAKE ONE TABLET EVERY 4 HOURS AS NEEDED FOR NAUSEA / VOMITING, Disp: 30 tablet, Rfl: 5 .  sildenafil (REVATIO) 20 MG tablet, Take 3 to 5 tablets two hours before intercouse on an empty stomach.  Do not take with nitrates., Disp: 50 tablet, Rfl: 3 .  silver sulfADIAZINE (SILVADENE) 1 % cream, Apply 1 application topically daily., Disp: 50 g, Rfl: 0 .  tiZANidine (ZANAFLEX) 4 MG tablet, TAKE TWO TABLETS FOUR TIMES DAILY, Disp: 240 tablet, Rfl: 3 .  VESICARE 10 MG tablet, , Disp: , Rfl:   Current Facility-Administered Medications:  .  methylPREDNISolone acetate (DEPO-MEDROL) injection 40 mg, 40 mg, Intramuscular, Once, Crystalee Ventress, Demetrios Isaacs, MD  Review of Systems  Constitutional: Negative for appetite change, chills and fever.  HENT: Positive for congestion, postnasal drip, rhinorrhea and sneezing.   Respiratory: Positive for cough. Negative for chest tightness, shortness of breath and wheezing.   Cardiovascular: Negative for chest pain and palpitations.  Gastrointestinal: Negative for abdominal pain, nausea and vomiting.  Musculoskeletal: Positive for myalgias.  Neurological: Positive for headaches.    Social History   Tobacco Use  . Smoking status: Former Games developer  . Smokeless  tobacco: Never Used  . Tobacco comment: Using Nicorette Gum  Substance Use Topics  . Alcohol use: Yes    Alcohol/week: 0.0 oz    Comment: occasionaly-rare   Objective:   BP 126/80 (BP Location: Left Arm, Patient Position: Sitting, Cuff Size: Normal)   Pulse 86   Temp 98.1 F (36.7 C)   Resp 16   SpO2 95%  Vitals:   10/22/17 1556  BP: 126/80  Pulse: 86  Resp: 16  Temp: 98.1 F (36.7 C)  SpO2: 95%     Physical Exam  Physical Exam   General Appearance:    Alert, cooperative, no distress  Eyes:    PERRL, conjunctiva/corneas clear, EOM's intact       Lungs:     Clear to auscultation bilaterally, respirations unlabored  Heart:    Regular rate and rhythm  MS:   Mild tenderness of lateral epicondyle.  No swelling or erythema. .Pain with supination against resistance and with abduction.       Results for orders placed or performed in visit on 10/22/17  POCT urinalysis dipstick  Result Value Ref Range   Color, UA amber    Clarity, UA clear    Glucose, UA negative    Bilirubin, UA negative    Ketones, UA negative    Spec Grav, UA 1.020 1.010 - 1.025   Blood, UA hemolyzed trace    pH, UA 6.5 5.0 - 8.0   Protein, UA 30 (+)    Urobilinogen, UA 0.2 0.2 or 1.0 E.U./dL   Nitrite, UA negative    Leukocytes, UA Negative Negative   Appearance amber    Odor mild        Assessment & Plan:     1. Chronic UTI Normal u/a today.  - POCT urinalysis dipstick  2. Upper respiratory tract infection, unspecified type  - fluticasone (FLONASE) 50 MCG/ACT nasal spray; Place 2 sprays into both nostrils daily.  Dispense: 16 g; Refill: 6 - albuterol (PROVENTIL HFA;VENTOLIN HFA) 108 (90 Base) MCG/ACT inhaler; Inhale 2 puffs into the lungs every 6 (six) hours as needed for wheezing or shortness of breath.  Dispense: 1 Inhaler; Refill: 0  3. Depression and anxiety Continue current dose paroxetine. He declined medication adjustment, will get back in with psychiatry   4. Hypothyroidism,  unspecified type Needs thyroid functions checked. Compliant with medications.  - TSH  5. Paraplegia [facility]) Functions well with ADLs  6. Chronic pain associated with significant psychosocial dysfunction Continue current medications.    7. Bladder neurogenesis   8. Chronic, continuous use of opioids  - Testosterone,Free and Total  9. Cellulitis of right upper extremity  - cephALEXin (KEFLEX) 500 MG capsule; Take 1 capsule (500 mg total) by mouth 4 (four) times daily for 7 days.  Dispense: 28 capsule; Refill: 0  10. Attention deficit disorder, unspecified hyperactivity presence Fairly well controlled. Continue current medications.   - amphetamine-dextroamphetamine (ADDERALL) 15 MG tablet; Take 1 tablet by mouth 2 (two) times daily.  Dispense: 60 tablet; Refill: 0  11. Encounter for special screening examination for cardiovascular disorder  - Lipid panel  12. Other fatigue  - Comprehensive metabolic panel - Testosterone,Free and Total - CBC with Differential/Platelet  13. Need for influenza vaccination  - Flu Vaccine QUAD 6+ mos PF IM (Fluarix Quad PF)  Addressed extensive list of chronic and acute medical problems today requiring extensive time in counseling and coordination of care.  Over half of this 45 minute visit were spent in counseling and coordinating care of multiple medical problems.       Mila Merry, MD  [facility]

## 2017-10-23 LAB — CBC WITH DIFFERENTIAL/PLATELET
BASOS: 0 %
Basophils Absolute: 0 10*3/uL (ref 0.0–0.2)
EOS (ABSOLUTE): 0.3 10*3/uL (ref 0.0–0.4)
EOS: 5 %
HEMATOCRIT: 39.6 % (ref 37.5–51.0)
Hemoglobin: 13.6 g/dL (ref 13.0–17.7)
Immature Grans (Abs): 0 10*3/uL (ref 0.0–0.1)
Immature Granulocytes: 0 %
Lymphocytes Absolute: 1.6 10*3/uL (ref 0.7–3.1)
Lymphs: 28 %
MCH: 27.4 pg (ref 26.6–33.0)
MCHC: 34.3 g/dL (ref 31.5–35.7)
MCV: 80 fL (ref 79–97)
MONOS ABS: 0.4 10*3/uL (ref 0.1–0.9)
Monocytes: 7 %
NEUTROS ABS: 3.5 10*3/uL (ref 1.4–7.0)
Neutrophils: 60 %
PLATELETS: 123 10*3/uL — AB (ref 150–379)
RBC: 4.97 x10E6/uL (ref 4.14–5.80)
RDW: 14.2 % (ref 12.3–15.4)
WBC: 5.7 10*3/uL (ref 3.4–10.8)

## 2017-10-23 LAB — TSH: TSH: 2.72 u[IU]/mL (ref 0.450–4.500)

## 2017-10-23 LAB — COMPREHENSIVE METABOLIC PANEL
A/G RATIO: 1.7 (ref 1.2–2.2)
ALBUMIN: 4 g/dL (ref 3.5–5.5)
ALK PHOS: 64 IU/L (ref 39–117)
ALT: 32 IU/L (ref 0–44)
AST: 21 IU/L (ref 0–40)
BUN / CREAT RATIO: 25 — AB (ref 9–20)
BUN: 16 mg/dL (ref 6–20)
Bilirubin Total: 0.2 mg/dL (ref 0.0–1.2)
CHLORIDE: 101 mmol/L (ref 96–106)
CO2: 23 mmol/L (ref 20–29)
Calcium: 9.1 mg/dL (ref 8.7–10.2)
Creatinine, Ser: 0.64 mg/dL — ABNORMAL LOW (ref 0.76–1.27)
GFR calc non Af Amer: 126 mL/min/{1.73_m2} (ref >59)
GFR, EST AFRICAN AMERICAN: 146 mL/min/{1.73_m2} (ref >59)
GLOBULIN, TOTAL: 2.4 g/dL (ref 1.5–4.5)
GLUCOSE: 105 mg/dL — AB (ref 65–99)
Potassium: 4.1 mmol/L (ref 3.5–5.2)
Sodium: 139 mmol/L (ref 134–144)
TOTAL PROTEIN: 6.4 g/dL (ref 6.0–8.5)

## 2017-10-23 LAB — TESTOSTERONE,FREE AND TOTAL
TESTOSTERONE: 271 ng/dL (ref 264–916)
Testosterone, Free: 2.1 pg/mL — ABNORMAL LOW (ref 8.7–25.1)

## 2017-10-23 LAB — LIPID PANEL
CHOL/HDL RATIO: 2.2 ratio (ref 0.0–5.0)
CHOLESTEROL TOTAL: 76 mg/dL — AB (ref 100–199)
HDL: 34 mg/dL — AB (ref >39)
LDL CALC: 19 mg/dL (ref 0–99)
TRIGLYCERIDES: 113 mg/dL (ref 0–149)
VLDL CHOLESTEROL CAL: 23 mg/dL (ref 5–40)

## 2017-10-24 ENCOUNTER — Telehealth: Payer: Self-pay | Admitting: *Deleted

## 2017-10-24 DIAGNOSIS — R7989 Other specified abnormal findings of blood chemistry: Secondary | ICD-10-CM

## 2017-10-24 NOTE — Telephone Encounter (Signed)
-----   Message from Malva Limesonald E Fisher, MD sent at 10/24/2017  8:19 AM EST ----- Cholesterol is good. Testosterone levels are low, which is a side effect of the pain medication he is taking.  If he wants to try testosterone medication insurance requires that we recheck levels to confirm they are low. Need to order FREE and TOTAL testosterone, prolactin, and FSH. Also, psychiatry office has been trying to contact patient to schedule appointment. He needs to call [facility] at [phone]

## 2017-10-24 NOTE — Telephone Encounter (Signed)
LMOVM for pt to return call 

## 2017-10-29 DIAGNOSIS — R339 Retention of urine, unspecified: Secondary | ICD-10-CM | POA: Diagnosis not present

## 2017-10-29 NOTE — Telephone Encounter (Signed)
Unable to contact the patient. L/M to call the office for results. Will save message to chart.

## 2017-11-01 NOTE — Telephone Encounter (Signed)
Patient advised and lab order has been placed, lab slip left up front for patient to pick up. Patient was given number to call psych to arrange appt. Patient states that if there is any further questions to call his wife cell listed below if he is unable to get a hold of. KW     [phone] ( wife cell)

## 2017-11-01 NOTE — Addendum Note (Signed)
Addended by: Fonda KinderWOLFORD, Kaylen Nghiem J on: 11/01/2017 12:31 PM   Modules accepted: Orders

## 2017-11-05 ENCOUNTER — Other Ambulatory Visit: Payer: Self-pay | Admitting: Family Medicine

## 2017-11-05 DIAGNOSIS — K5909 Other constipation: Secondary | ICD-10-CM

## 2017-11-11 ENCOUNTER — Other Ambulatory Visit: Payer: Self-pay | Admitting: Family Medicine

## 2017-11-11 DIAGNOSIS — G894 Chronic pain syndrome: Secondary | ICD-10-CM

## 2017-11-11 NOTE — Telephone Encounter (Signed)
Pt called to make sure we received the refill request for the following medications:  1. methadone (DOLOPHINE) 10 MG tablet  2. oxyCODONE (ROXICODONE) 15 MG immediate release tablet  [facility]  Please advise. Thanks TNP

## 2017-11-15 ENCOUNTER — Encounter: Payer: Self-pay | Admitting: Family Medicine

## 2017-11-15 DIAGNOSIS — S24109A Unspecified injury at unspecified level of thoracic spinal cord, initial encounter: Secondary | ICD-10-CM | POA: Insufficient documentation

## 2017-11-26 DIAGNOSIS — R339 Retention of urine, unspecified: Secondary | ICD-10-CM | POA: Diagnosis not present

## 2017-12-19 ENCOUNTER — Other Ambulatory Visit: Payer: Self-pay | Admitting: Family Medicine

## 2017-12-19 DIAGNOSIS — G894 Chronic pain syndrome: Secondary | ICD-10-CM

## 2017-12-19 NOTE — Telephone Encounter (Signed)
Patient is requesting a call when RX has been sent to pharmacy. CB# [phone]

## 2017-12-25 DIAGNOSIS — R339 Retention of urine, unspecified: Secondary | ICD-10-CM | POA: Diagnosis not present

## 2018-01-01 ENCOUNTER — Other Ambulatory Visit: Payer: Self-pay | Admitting: Family Medicine

## 2018-01-01 DIAGNOSIS — G894 Chronic pain syndrome: Secondary | ICD-10-CM

## 2018-01-08 IMAGING — US US EXTREM LOW VENOUS*L*
1 series · 13 of 24 positions shown · non-contrast
Comparison: None.

CLINICAL DATA: Left leg pain for 2 weeks.



[Series 1: us extrem low venous*left* · 0.08mm/px · 13 of 34 slices shown]
[im 1/34]
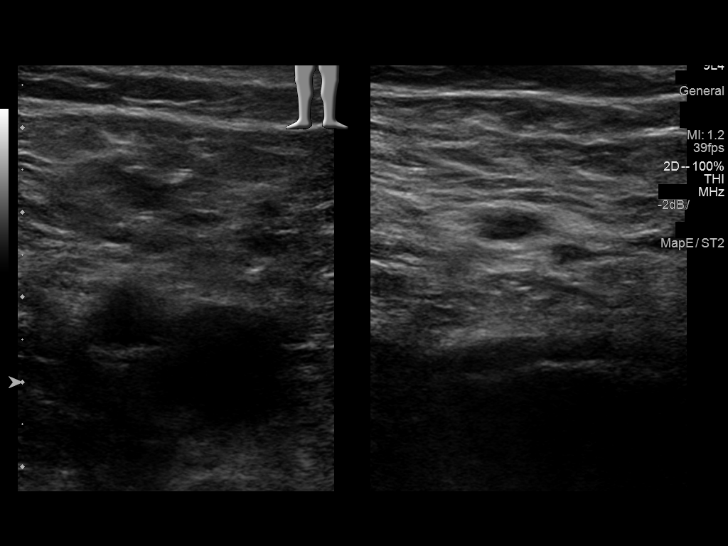
[im 3/34]
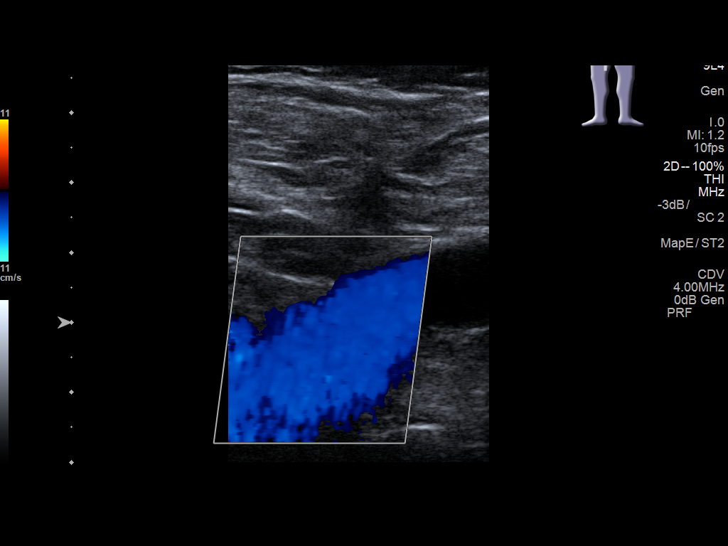
[im 6/34]
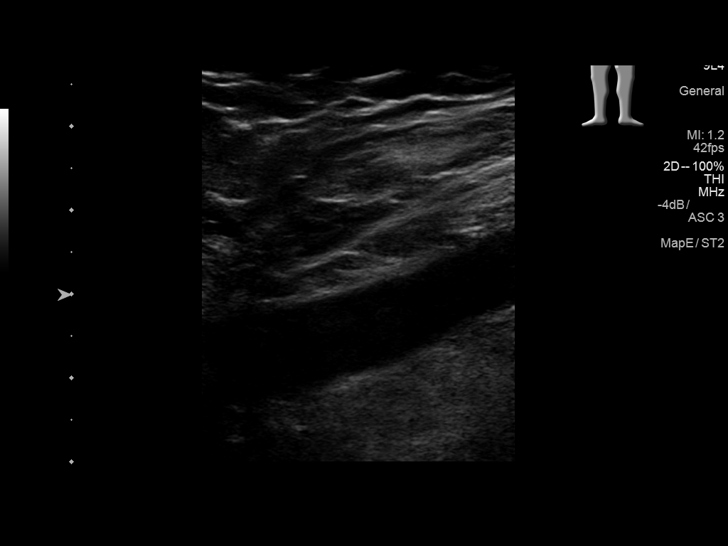
[im 9/34]
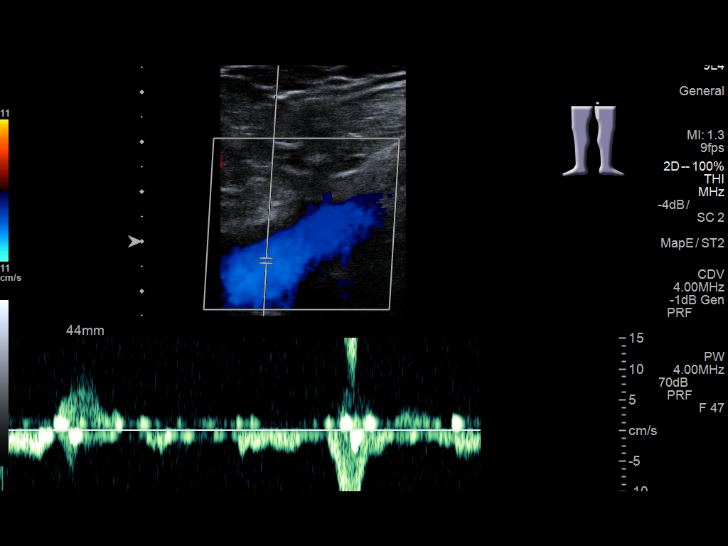
[im 12/34]
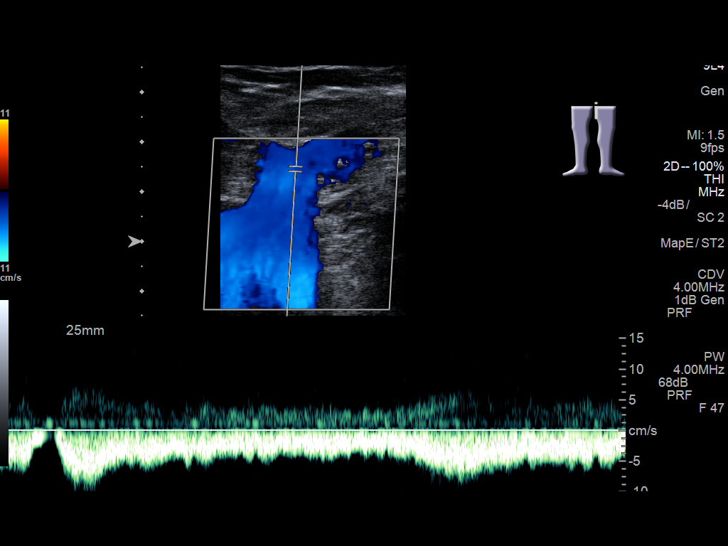
[im 15/34]
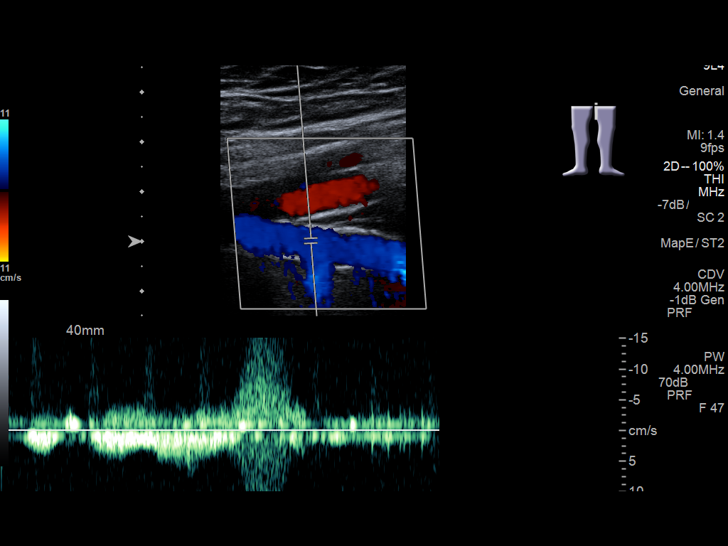
[im 18/34]
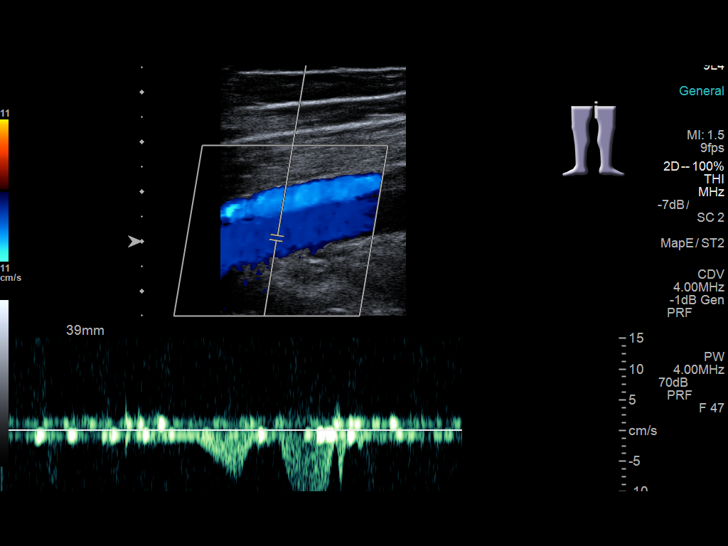
[im 19/34]
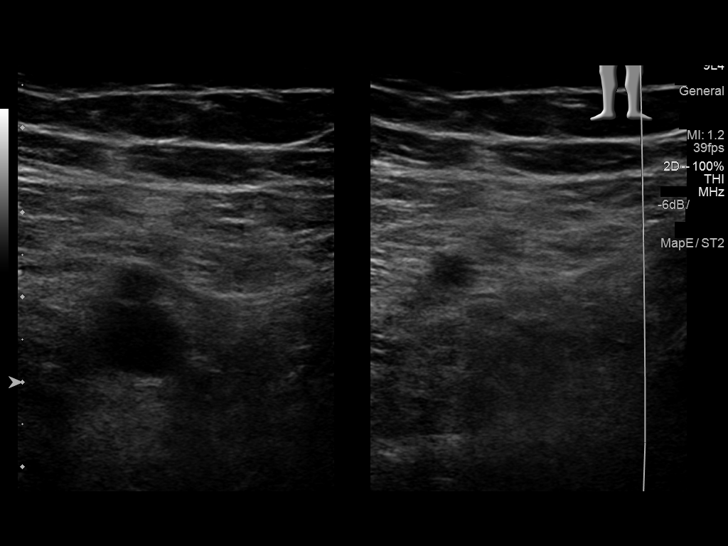
[im 22/34]
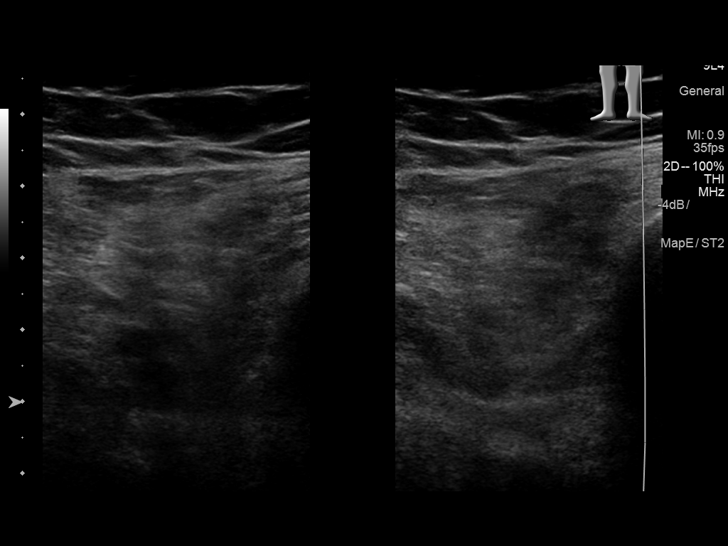
[im 25/34]
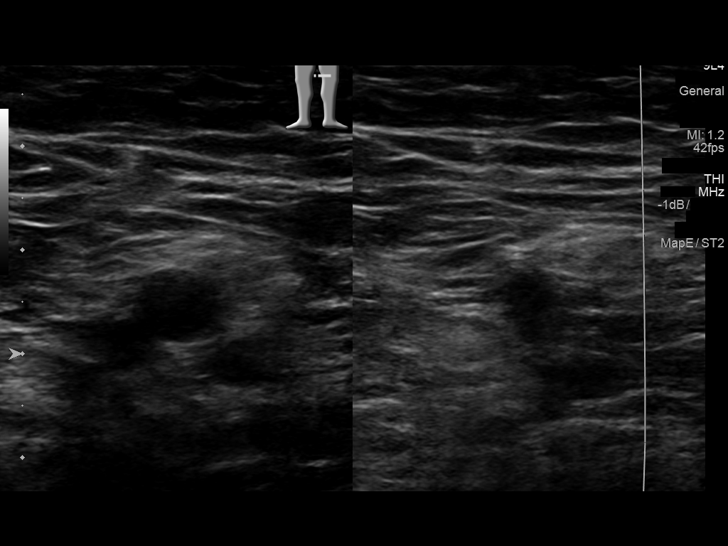
[im 28/34]
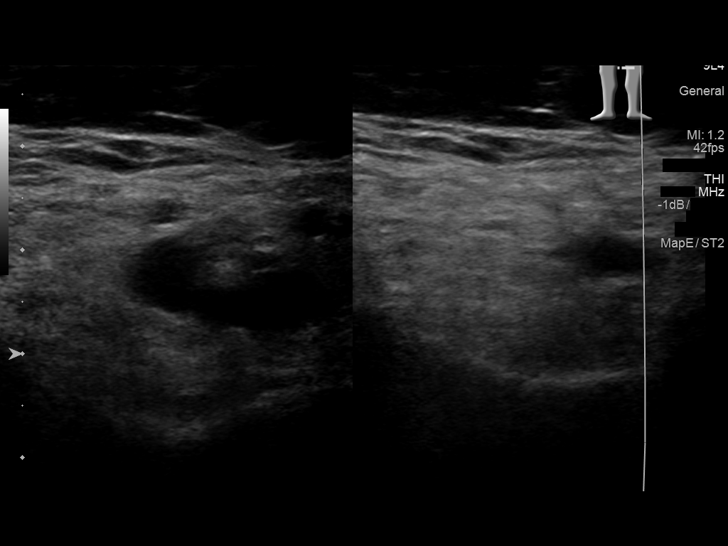
[im 31/34]
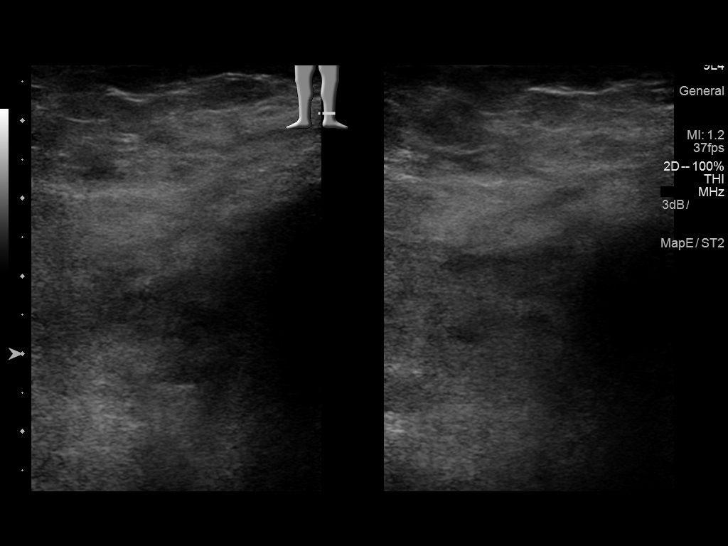
[im 34/34]
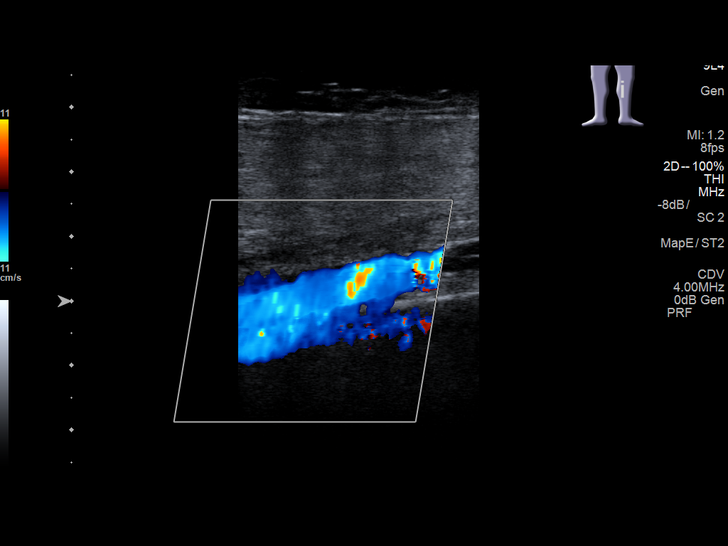

[13 of 24 positions shown; findings below may reference images not displayed]

FINDINGS: Contralateral Common Femoral Vein: Respiratory phasicity is normal
and symmetric with the symptomatic side. No evidence of thrombus.
Normal compressibility.

Common Femoral Vein: No evidence of thrombus. Normal
compressibility, respiratory phasicity and response to augmentation.

Saphenofemoral Junction: No evidence of thrombus. Normal
compressibility and flow on color Doppler imaging.

Profunda Femoral Vein: No evidence of thrombus. Normal
compressibility and flow on color Doppler imaging.

Femoral Vein: No evidence of thrombus. Normal compressibility,
respiratory phasicity and response to augmentation.

Popliteal Vein: No evidence of thrombus. Normal compressibility,
respiratory phasicity and response to augmentation.

Calf Veins: No evidence of thrombus. Normal compressibility and flow
on color Doppler imaging.

Other Findings:  None.
IMPRESSION: Negative for deep venous thrombosis in left lower extremity.

## 2018-01-17 ENCOUNTER — Ambulatory Visit: Payer: Self-pay | Admitting: Family Medicine

## 2018-01-22 ENCOUNTER — Encounter: Payer: Self-pay | Admitting: Family Medicine

## 2018-01-22 ENCOUNTER — Ambulatory Visit (INDEPENDENT_AMBULATORY_CARE_PROVIDER_SITE_OTHER): Payer: Medicare HMO | Admitting: Family Medicine

## 2018-01-22 VITALS — BP 122/78 | HR 92 | Temp 98.5°F | Resp 16

## 2018-01-22 DIAGNOSIS — F329 Major depressive disorder, single episode, unspecified: Secondary | ICD-10-CM

## 2018-01-22 DIAGNOSIS — R21 Rash and other nonspecific skin eruption: Secondary | ICD-10-CM | POA: Diagnosis not present

## 2018-01-22 DIAGNOSIS — F32A Depression, unspecified: Secondary | ICD-10-CM

## 2018-01-22 DIAGNOSIS — R69 Illness, unspecified: Secondary | ICD-10-CM | POA: Diagnosis not present

## 2018-01-22 DIAGNOSIS — F419 Anxiety disorder, unspecified: Secondary | ICD-10-CM | POA: Diagnosis not present

## 2018-01-22 NOTE — Patient Instructions (Signed)
Try OTC melatonin at bedtime up to  to help sleep

## 2018-01-22 NOTE — Progress Notes (Signed)
Patient: Justin [name] Male    DOB: 03/14/1981   37 y.o.   MRN: [id number] Visit Date: 01/22/2018  Today's Provider: Mila Merry, MD   Chief Complaint  Patient presents with  . Rash    x 1 year   Subjective:    Rash  This is a chronic problem. The current episode started more than 1 year ago. The problem has been gradually worsening since onset. The affected locations include the right elbow. The rash is characterized by redness. Pertinent negatives include no fever, shortness of breath or vomiting.  He was seen 10-22-2017 and thought to have mild cellulitis and prescribed cephalexin,, but he report it did not improve at all. It is not itchy or painful. He denies any repetitive trauma such as leaning on elbows. No known injury.    He also states he did receive call from psychiatrist to schedule referral for depression. He thinks he may have accidentally deleted the voice mail. He still feels like he needs to follow up with psychiatry and counselor to to PTSD and depression.     Allergies  Allergen Reactions  . Azithromycin     throat closes  . Iodine   . Lorazepam     extremely hostile  . Meperidine   . Morphine Sulfate     Psychotic symptoms     Current Outpatient Medications:  .  albuterol (PROVENTIL HFA;VENTOLIN HFA) 108 (90 Base) MCG/ACT inhaler, Inhale 2 puffs into the lungs every 6 (six) hours as needed for wheezing or shortness of breath., Disp: 1 Inhaler, Rfl: 0 .  alprazolam (XANAX) 2 MG tablet, TAKE ONE TABLET EVERY 6 HOURS AS NEEDED, Disp: 120 tablet, Rfl: 5 .  AMITIZA 24 MCG capsule, TAKE 1 CAPSULE BY MOUTH TWICE DAILY, Disp: 60 capsule, Rfl: 5 .  amphetamine-dextroamphetamine (ADDERALL) 15 MG tablet, Take 1 tablet by mouth 2 (two) times daily., Disp: 60 tablet, Rfl: 0 .  baclofen (LIORESAL) 20 MG tablet, TAKE ONE TABLET FOUR TIMES DAILY, Disp: 120 each, Rfl: 12 .  clotrimazole-betamethasone (LOTRISONE) cream, Apply 1 application topically 2  (two) times daily., Disp: 30 g, Rfl: 4 .  dantrolene (DANTRIUM) 25 MG capsule, Take 1 capsule by mouth daily. Reported on 10/26/2015, Disp: , Rfl:  .  diphenhydrAMINE (BENADRYL) 25 MG tablet, Take 1 tablet by mouth as needed., Disp: , Rfl:  .  docusate sodium (COLACE) 250 MG capsule, Take 1 capsule (250 mg total) by mouth 4 (four) times daily as needed., Disp: 120 capsule, Rfl: 3 .  dronabinol (MARINOL) 10 MG capsule, Take 1 capsule (10 mg total) by mouth every 6 (six) hours as needed., Disp: 30 capsule, Rfl: 1 .  fluticasone (FLONASE) 50 MCG/ACT nasal spray, Place 2 sprays into both nostrils daily., Disp: 16 g, Rfl: 6 .  gabapentin (NEURONTIN) 800 MG tablet, Take 1 tablet (800 mg total) by mouth 4 (four) times daily., Disp: 120 tablet, Rfl: 5 .  levothyroxine (SYNTHROID, LEVOTHROID) 75 MCG tablet, TAKE ONE TABLET EVERY DAY BEFORE BREAKFAST, Disp: 30 tablet, Rfl: 11 .  lidocaine (LMX) 4 % cream, Apply 1 application topically 3 (three) times daily., Disp: 133 g, Rfl: 5 .  methadone (DOLOPHINE) 10 MG tablet, TAKE TWO TABLETS EVERY 6 HOURS, Disp: 240 tablet, Rfl: 0 .  Mineral Oil OIL, daily., Disp: , Rfl:  .  naproxen (NAPROSYN) 500 MG tablet, TAKE ONE TABLET BY MOUTH TWICE DAILY, Disp: 60 tablet, Rfl: 5 .  NIFEdipine (PROCARDIA) 20 MG capsule,  One tablet three times a day as needed, Disp: 60 capsule, Rfl: 1 .  omeprazole (PRILOSEC) 20 MG capsule, Take 1 capsule (20 mg total) by mouth daily., Disp: 30 capsule, Rfl: 12 .  oxyCODONE (ROXICODONE) 15 MG immediate release tablet, TAKE ONE TABLET EVERY 6 HOURS AS NEEDED FOR PAIN, Disp: 120 tablet, Rfl: 0 .  PARoxetine (PAXIL) 30 MG tablet, TAKE TWO TABLETS BY MOUTH EVERY DAY, Disp: 60 tablet, Rfl: 12 .  phenazopyridine (PYRIDIUM) 200 MG tablet, Take 1 tablet by mouth 3 (three) times daily., Disp: , Rfl:  .  polyethylene glycol powder (GLYCOLAX/MIRALAX) powder, TAKE 17 GRAMS IN 8 OZ OF WATER OR OTHER LIQUID DAILY, Disp: 527 g, Rfl: 4 .  promethazine  (PHENERGAN) 25 MG tablet, TAKE ONE TABLET EVERY 4 HOURS AS NEEDED FOR NAUSEA / VOMITING, Disp: 30 tablet, Rfl: 5 .  sildenafil (REVATIO) 20 MG tablet, Take 3 to 5 tablets two hours before intercouse on an empty stomach.  Do not take with nitrates., Disp: 50 tablet, Rfl: 3 .  silver sulfADIAZINE (SILVADENE) 1 % cream, Apply 1 application topically daily., Disp: 50 g, Rfl: 0 .  tiZANidine (ZANAFLEX) 4 MG tablet, TAKE TWO TABLETS FOUR TIMES DAILY, Disp: 240 tablet, Rfl: 3 .  VESICARE 10 MG tablet, , Disp: , Rfl:   Current Facility-Administered Medications:  .  methylPREDNISolone acetate (DEPO-MEDROL) injection 40 mg, 40 mg, Intramuscular, Once, Fisher, Demetrios Isaacs, MD  Review of Systems  Constitutional: Negative for appetite change, chills and fever.  Respiratory: Negative for chest tightness, shortness of breath and wheezing.   Cardiovascular: Negative for chest pain and palpitations.  Gastrointestinal: Negative for abdominal pain, nausea and vomiting.  Skin: Positive for rash.  Psychiatric/Behavioral: Positive for sleep disturbance (trouble staying asleep).    Social History   Tobacco Use  . Smoking status: Former Games developer  . Smokeless tobacco: Never Used  . Tobacco comment: Using Nicorette Gum  Substance Use Topics  . Alcohol use: Yes    Alcohol/week: 0.0 oz    Comment: occasionaly-rare   Objective:   BP 122/78 (BP Location: Left Arm, Patient Position: Sitting, Cuff Size: Large)   Pulse 92   Temp 98.5 F (36.9 C) (Oral)   Resp 16   SpO2 97% Comment: room air There were no vitals filed for this visit.   Physical Exam  General appearance: alert, well developed, well nourished, cooperative and in no distress Head: Normocephalic, without obvious abnormality, atraumatic Respiratory: Respirations even and unlabored, normal respiratory rate Extremities: Patch of dull red blanching erythema right posterior elbow.slightly dry overlying skin, slightly puffy, not tender. No open wounds,  no discharge.      Assessment & Plan:     1. Rash  - Ambulatory referral to Dermatology  2. Anxiety   3. Depression, unspecified depression type Refer psychiatry.        Mila Merry, MD  [facility]

## 2018-01-24 ENCOUNTER — Other Ambulatory Visit: Payer: Self-pay | Admitting: Family Medicine

## 2018-01-24 DIAGNOSIS — G894 Chronic pain syndrome: Secondary | ICD-10-CM

## 2018-01-24 DIAGNOSIS — R339 Retention of urine, unspecified: Secondary | ICD-10-CM | POA: Diagnosis not present

## 2018-02-11 ENCOUNTER — Telehealth (INDEPENDENT_AMBULATORY_CARE_PROVIDER_SITE_OTHER): Payer: Medicare HMO | Admitting: Family Medicine

## 2018-02-11 DIAGNOSIS — N390 Urinary tract infection, site not specified: Secondary | ICD-10-CM

## 2018-02-11 DIAGNOSIS — T83511D Infection and inflammatory reaction due to indwelling urethral catheter, subsequent encounter: Secondary | ICD-10-CM

## 2018-02-11 LAB — POCT URINALYSIS DIPSTICK
APPEARANCE: NORMAL
Bilirubin, UA: NEGATIVE
Glucose, UA: NEGATIVE
Ketones, UA: NEGATIVE
NITRITE UA: NEGATIVE
PH UA: 7 (ref 5.0–8.0)
PROTEIN UA: POSITIVE — AB
RBC UA: NEGATIVE
Spec Grav, UA: 1.02 (ref 1.010–1.025)
UROBILINOGEN UA: 0.2 U/dL

## 2018-02-11 NOTE — Telephone Encounter (Signed)
Pt called to say mom is dropping off a urine sample/  He says she does this if he is having symptoms.  No need to return call.  teri

## 2018-02-11 NOTE — Telephone Encounter (Signed)
Please advise 

## 2018-02-17 ENCOUNTER — Other Ambulatory Visit: Payer: Self-pay | Admitting: Family Medicine

## 2018-02-17 ENCOUNTER — Telehealth: Payer: Self-pay | Admitting: Family Medicine

## 2018-02-17 DIAGNOSIS — G8220 Paraplegia, unspecified: Secondary | ICD-10-CM

## 2018-02-17 DIAGNOSIS — G894 Chronic pain syndrome: Secondary | ICD-10-CM

## 2018-02-17 NOTE — Telephone Encounter (Signed)
Pt called asking for results from his UC  His call back is [phone]  Thanks teri

## 2018-02-18 NOTE — Telephone Encounter (Signed)
Patient's wife was advised we did not send for culture due to UA being normal.

## 2018-02-20 ENCOUNTER — Other Ambulatory Visit: Payer: Self-pay | Admitting: Family Medicine

## 2018-02-20 DIAGNOSIS — G894 Chronic pain syndrome: Secondary | ICD-10-CM

## 2018-02-25 DIAGNOSIS — R339 Retention of urine, unspecified: Secondary | ICD-10-CM | POA: Diagnosis not present

## 2018-03-14 ENCOUNTER — Telehealth: Payer: Self-pay | Admitting: Family Medicine

## 2018-03-14 NOTE — Telephone Encounter (Signed)
Pt was referred to dermatology in May for a rash.He has not returned calls from their office.[facility] information has been given to pt[facility]

## 2018-03-17 ENCOUNTER — Other Ambulatory Visit: Payer: Self-pay | Admitting: Family Medicine

## 2018-03-17 DIAGNOSIS — G894 Chronic pain syndrome: Secondary | ICD-10-CM

## 2018-03-18 ENCOUNTER — Other Ambulatory Visit: Payer: Self-pay | Admitting: Family Medicine

## 2018-03-18 DIAGNOSIS — F339 Major depressive disorder, recurrent, unspecified: Secondary | ICD-10-CM

## 2018-03-18 MED ORDER — PROMETHAZINE HCL 25 MG PO TABS
ORAL_TABLET | ORAL | 5 refills | Status: DC
Start: 2018-03-18 — End: 2018-04-01

## 2018-03-18 NOTE — Telephone Encounter (Signed)
[facility] faxed a refill request for the following medication. Thanks CC  promethazine (PHENERGAN) 25 MG tablet

## 2018-03-21 ENCOUNTER — Other Ambulatory Visit: Payer: Self-pay | Admitting: Family Medicine

## 2018-03-21 DIAGNOSIS — F988 Other specified behavioral and emotional disorders with onset usually occurring in childhood and adolescence: Secondary | ICD-10-CM

## 2018-03-26 DIAGNOSIS — R339 Retention of urine, unspecified: Secondary | ICD-10-CM | POA: Diagnosis not present

## 2018-04-01 ENCOUNTER — Other Ambulatory Visit: Payer: Self-pay | Admitting: Family Medicine

## 2018-04-01 MED ORDER — PROMETHAZINE HCL 25 MG PO TABS: ORAL_TABLET | ORAL | 5 refills | Status: DC

## 2018-04-01 NOTE — Telephone Encounter (Signed)
[facility] faxed a refill request for the following medication. Thanks CC  promethazine (PHENERGAN) 25 MG tablet

## 2018-04-16 ENCOUNTER — Other Ambulatory Visit: Payer: Self-pay | Admitting: Family Medicine

## 2018-04-25 DIAGNOSIS — R339 Retention of urine, unspecified: Secondary | ICD-10-CM | POA: Diagnosis not present

## 2018-05-15 ENCOUNTER — Telehealth (INDEPENDENT_AMBULATORY_CARE_PROVIDER_SITE_OTHER): Payer: Medicare HMO

## 2018-05-15 ENCOUNTER — Other Ambulatory Visit: Payer: Self-pay | Admitting: Family Medicine

## 2018-05-15 DIAGNOSIS — N390 Urinary tract infection, site not specified: Secondary | ICD-10-CM

## 2018-05-15 DIAGNOSIS — R319 Hematuria, unspecified: Secondary | ICD-10-CM

## 2018-05-15 DIAGNOSIS — K5909 Other constipation: Secondary | ICD-10-CM

## 2018-05-15 DIAGNOSIS — G894 Chronic pain syndrome: Secondary | ICD-10-CM

## 2018-05-15 DIAGNOSIS — G8220 Paraplegia, unspecified: Secondary | ICD-10-CM

## 2018-05-15 LAB — POCT URINALYSIS DIPSTICK
GLUCOSE UA: NEGATIVE
KETONES UA: NEGATIVE
Nitrite, UA: NEGATIVE
Protein, UA: POSITIVE — AB
SPEC GRAV UA: 1.015 (ref 1.010–1.025)
Urobilinogen, UA: 0.2 E.U./dL
pH, UA: 6.5 (ref 5.0–8.0)

## 2018-05-15 NOTE — Telephone Encounter (Signed)
Patient's mom Sherry sCordelia Pentopped by the office and dropped off a urine sample. She says patient has been having burning pains and bladder spasms. She request that his urine be checked. Patient uses [facility]. Patients mom would like a call when results are in. Her number is [phone]. POCT Urinalysis done and urine culture sent off to lab. Please review.

## 2018-05-17 ENCOUNTER — Other Ambulatory Visit: Payer: Self-pay | Admitting: Family Medicine

## 2018-05-17 MED ORDER — FOSFOMYCIN TROMETHAMINE 3 G PO PACK: PACK | ORAL | 0 refills | Status: DC

## 2018-05-20 ENCOUNTER — Telehealth: Payer: Self-pay | Admitting: *Deleted

## 2018-05-20 LAB — URINE CULTURE

## 2018-05-20 NOTE — Telephone Encounter (Signed)
-----   Message from Malva Limes, MD sent at 05/17/2018  8:15 AM EDT ----- Prescription for fosfomycin sent to [facility]

## 2018-05-20 NOTE — Telephone Encounter (Signed)
Error

## 2018-05-22 DIAGNOSIS — R339 Retention of urine, unspecified: Secondary | ICD-10-CM | POA: Diagnosis not present

## 2018-06-13 ENCOUNTER — Ambulatory Visit: Payer: Self-pay

## 2018-06-17 ENCOUNTER — Ambulatory Visit (INDEPENDENT_AMBULATORY_CARE_PROVIDER_SITE_OTHER): Payer: Medicare HMO | Admitting: Family Medicine

## 2018-06-17 ENCOUNTER — Encounter: Payer: Self-pay | Admitting: Family Medicine

## 2018-06-17 VITALS — BP 115/76 | HR 82 | Temp 98.3°F | Resp 16

## 2018-06-17 DIAGNOSIS — G894 Chronic pain syndrome: Secondary | ICD-10-CM

## 2018-06-17 DIAGNOSIS — N390 Urinary tract infection, site not specified: Secondary | ICD-10-CM | POA: Diagnosis not present

## 2018-06-17 DIAGNOSIS — Z23 Encounter for immunization: Secondary | ICD-10-CM | POA: Diagnosis not present

## 2018-06-17 MED ORDER — OXYCODONE HCL 15 MG PO TABS: ORAL_TABLET | ORAL | 0 refills | Status: DC

## 2018-06-17 MED ORDER — METHADONE HCL 10 MG PO TABS: ORAL_TABLET | ORAL | 0 refills | Status: DC

## 2018-06-17 NOTE — Progress Notes (Signed)
Patient: Justin [name] Male    DOB: 06-Nov-1980   37 y.o.   MRN: [id number] Visit Date: 06/17/2018  Today's Provider: Mila Merry, MD   No chief complaint on file.  Subjective:    HPI  Patient states he has been having a burning sensation in his bladder off and on for a few days and urine has been stronger in odor. He would like to get urine checked and to get refills on his chronic medications.  He states current medication regiment is working well. He has been in generally good mood and not having trouble with anxiety.   Allergies  Allergen Reactions  . Azithromycin     throat closes  . Iodine   . Lorazepam     extremely hostile  . Meperidine   . Morphine Sulfate     Psychotic symptoms     Current Outpatient Medications:  .  albuterol (PROVENTIL HFA;VENTOLIN HFA) 108 (90 Base) MCG/ACT inhaler, Inhale 2 puffs into the lungs every 6 (six) hours as needed for wheezing or shortness of breath., Disp: 1 Inhaler, Rfl: 0 .  alprazolam (XANAX) 2 MG tablet, TAKE ONE TABLET BY MOUTH EVERY 6 HOURS AS NEEDED, Disp: 120 tablet, Rfl: 3 .  AMITIZA 24 MCG capsule, TAKE 1 CAPSULE BY MOUTH TWICE DAILY, Disp: 60 capsule, Rfl: 11 .  amphetamine-dextroamphetamine (ADDERALL) 15 MG tablet, TAKE ONE TABLET BY MOUTH TWICE DAILY, Disp: 60 tablet, Rfl: 0 .  baclofen (LIORESAL) 20 MG tablet, TAKE ONE TABLET FOUR TIMES DAILY, Disp: 120 each, Rfl: 12 .  clotrimazole-betamethasone (LOTRISONE) cream, Apply 1 application topically 2 (two) times daily., Disp: 30 g, Rfl: 4 .  dantrolene (DANTRIUM) 25 MG capsule, Take 1 capsule by mouth daily. Reported on 10/26/2015, Disp: , Rfl:  .  diphenhydrAMINE (BENADRYL) 25 MG tablet, Take 1 tablet by mouth as needed., Disp: , Rfl:  .  docusate sodium (COLACE) 250 MG capsule, Take 1 capsule (250 mg total) by mouth 4 (four) times daily as needed., Disp: 120 capsule, Rfl: 3 .  dronabinol (MARINOL) 10 MG capsule, Take 1 capsule (10 mg total) by mouth every  6 (six) hours as needed., Disp: 30 capsule, Rfl: 1 .  fluticasone (FLONASE) 50 MCG/ACT nasal spray, Place 2 sprays into both nostrils daily., Disp: 16 g, Rfl: 6 .  gabapentin (NEURONTIN) 800 MG tablet, Take 1 tablet (800 mg total) by mouth 4 (four) times daily., Disp: 120 tablet, Rfl: 5 .  levothyroxine (SYNTHROID, LEVOTHROID) 75 MCG tablet, TAKE ONE TABLET EVERY DAY BEFORE BREAKFAST, Disp: 30 tablet, Rfl: 11 .  lidocaine (LMX) 4 % cream, Apply 1 application topically 3 (three) times daily., Disp: 133 g, Rfl: 5 .  methadone (DOLOPHINE) 10 MG tablet, TAKE TWO TABLETS EVERY SIX HOURS, Disp: 240 tablet, Rfl: 0 .  Mineral Oil OIL, daily., Disp: , Rfl:  .  naproxen (NAPROSYN) 500 MG tablet, TAKE ONE TABLET TWICE DAILY, Disp: 60 tablet, Rfl: 5 .  omeprazole (PRILOSEC) 20 MG capsule, TAKE 1 CAPSULE EVERY DAY, Disp: 30 capsule, Rfl: 12 .  oxyCODONE (ROXICODONE) 15 MG immediate release tablet, TAKE 1 TABLET BY MOUTH EVERY 6 HOUR AS NEEDED FOR PAIN, Disp: 120 tablet, Rfl: 0 .  PARoxetine (PAXIL) 30 MG tablet, TAKE TWO TABLETS BY MOUTH EVERY DAY, Disp: 60 tablet, Rfl: 12 .  phenazopyridine (PYRIDIUM) 200 MG tablet, Take 1 tablet by mouth 3 (three) times daily., Disp: , Rfl:  .  polyethylene glycol powder (GLYCOLAX/MIRALAX) powder, TAKE 17  GRAMS IN 8 OZ OF WATER OR OTHER LIQUID DAILY, Disp: 527 g, Rfl: 4 .  promethazine (PHENERGAN) 25 MG tablet, TAKE ONE TABLET EVERY 4 HOURS AS NEEDED FOR NAUSEA / VOMITING, Disp: 30 tablet, Rfl: 5 .  sildenafil (REVATIO) 20 MG tablet, Take 3 to 5 tablets two hours before intercouse on an empty stomach.  Do not take with nitrates., Disp: 50 tablet, Rfl: 3 .  silver sulfADIAZINE (SILVADENE) 1 % cream, Apply 1 application topically daily., Disp: 50 g, Rfl: 0 .  tiZANidine (ZANAFLEX) 4 MG tablet, TAKE TWO TABLETS FOUR TIMES DAILY, Disp: 240 tablet, Rfl: 3 .  VESICARE 10 MG tablet, TAKE ONE TABLET BY MOUTH EVERY DAY, Disp: 30 tablet, Rfl: 5 .  NIFEdipine (PROCARDIA) 20 MG capsule,  One tablet three times a day as needed (Patient not taking: Reported on 06/17/2018), Disp: 60 capsule, Rfl: 1  Current Facility-Administered Medications:  .  methylPREDNISolone acetate (DEPO-MEDROL) injection 40 mg, 40 mg, Intramuscular, Once, Fisher, Demetrios Isaacs, MD  Review of Systems  Constitutional: Negative for appetite change, chills and fever.  Respiratory: Negative for chest tightness, shortness of breath and wheezing.   Cardiovascular: Negative for chest pain and palpitations.  Gastrointestinal: Negative for abdominal pain, nausea and vomiting.    Social History   Tobacco Use  . Smoking status: Former Games developer  . Smokeless tobacco: Never Used  . Tobacco comment: Using Nicorette Gum  Substance Use Topics  . Alcohol use: Yes    Alcohol/week: 0.0 standard drinks    Comment: occasionaly-rare   Objective:   BP 115/76 (BP Location: Right Arm, Patient Position: Sitting, Cuff Size: Large)   Pulse 82   Temp 98.3 F (36.8 C) (Oral)   Resp 16   SpO2 95%  Vitals:   06/17/18 1611  BP: 115/76  Pulse: 82  Resp: 16  Temp: 98.3 F (36.8 C)  TempSrc: Oral  SpO2: 95%     Physical Exam   General Appearance:    Alert, cooperative, no distress  Eyes:    PERRL, conjunctiva/corneas clear, EOM's intact       Lungs:     Clear to auscultation bilaterally, respirations unlabored  Heart:    Regular rate and rhythm  Neurologic:   Awake, alert, oriented x 3. No apparent focal neurological           defect.      U/a negative. Sg=1010. Ph=6.5     Assessment & Plan:     1. Chronic UTI (urinary tract infection)  - POCT urinalysis dipstick - CULTURE, URINE COMPREHENSIVE  2. Chronic pain associated with significant psychosocial dysfunction Stable on current medications.  - oxyCODONE (ROXICODONE) 15 MG immediate release tablet; TAKE 1 TABLET BY MOUTH EVERY 6 HOUR AS NEEDED FOR PAIN  Dispense: 120 tablet; Refill: 0 - methadone (DOLOPHINE) 10 MG tablet; TAKE TWO TABLETS EVERY SIX HOURS   Dispense: 240 tablet; Refill: 0  3. Need for influenza vaccination  - Flu Vaccine QUAD 36+ mos IM       Mila Merry, MD  [facility]

## 2018-06-18 ENCOUNTER — Telehealth: Payer: Self-pay

## 2018-06-18 NOTE — Telephone Encounter (Signed)
LMTCB and r/s previously missed AWV. -MM 

## 2018-06-19 DIAGNOSIS — R339 Retention of urine, unspecified: Secondary | ICD-10-CM | POA: Diagnosis not present

## 2018-06-21 LAB — CULTURE, URINE COMPREHENSIVE

## 2018-07-01 NOTE — Telephone Encounter (Signed)
Pt declined scheduling the AWV at this time and states that he will CB once he is ready to schedule. Closing TE. -MM

## 2018-07-16 ENCOUNTER — Other Ambulatory Visit: Payer: Self-pay | Admitting: Family Medicine

## 2018-07-16 DIAGNOSIS — G894 Chronic pain syndrome: Secondary | ICD-10-CM

## 2018-07-21 ENCOUNTER — Telehealth: Payer: Self-pay | Admitting: Family Medicine

## 2018-07-21 DIAGNOSIS — N390 Urinary tract infection, site not specified: Secondary | ICD-10-CM | POA: Diagnosis not present

## 2018-07-21 NOTE — Telephone Encounter (Signed)
Pt advised.   Thanks,   -Laura  

## 2018-07-21 NOTE — Telephone Encounter (Signed)
Patient complains of burning urinary pain, spasms and chills.  Ok per Dr. [name] for patient to bring urine sample for urinalysis and urine culture.

## 2018-07-21 NOTE — Telephone Encounter (Signed)
That fine.

## 2018-07-21 NOTE — Addendum Note (Signed)
Addended by: Kavin Leech E on: 07/21/2018 02:37 PM   Modules accepted: Orders

## 2018-07-21 NOTE — Telephone Encounter (Signed)
Pt's mom dropped off a urine sample.  Pt has taking Azo so will send for culture.   Thanks,   -Vernona Rieger

## 2018-07-21 NOTE — Telephone Encounter (Signed)
Pt called asking if mom could drop off a urine sample.  Pt feels he has a UTI  Their call back is [phone]  Thanks  teri

## 2018-07-22 ENCOUNTER — Telehealth: Payer: Self-pay | Admitting: Family Medicine

## 2018-07-22 NOTE — Telephone Encounter (Signed)
Results will be back tomorrow.

## 2018-07-22 NOTE — Telephone Encounter (Signed)
LVMTRC 

## 2018-07-22 NOTE — Telephone Encounter (Signed)
Pt calling to get recent lab results. Please call pt back when ready.  Thanks, [employer]

## 2018-07-22 NOTE — Telephone Encounter (Signed)
Patient was advised.  

## 2018-07-23 ENCOUNTER — Telehealth: Payer: Self-pay | Admitting: Family Medicine

## 2018-07-23 ENCOUNTER — Other Ambulatory Visit: Payer: Self-pay | Admitting: Family Medicine

## 2018-07-23 LAB — CULTURE, URINE COMPREHENSIVE

## 2018-07-23 MED ORDER — FOSFOMYCIN TROMETHAMINE 3 G PO PACK: PACK | ORAL | 0 refills | Status: DC

## 2018-07-23 NOTE — Progress Notes (Signed)
Culture shows UTI. Have sent prescription for fosphomycin to [facility].

## 2018-07-23 NOTE — Telephone Encounter (Signed)
Patient is requesting urine culture results. Please review.

## 2018-07-23 NOTE — Telephone Encounter (Signed)
Please see result note 

## 2018-07-23 NOTE — Telephone Encounter (Signed)
Pt needing urinalysis results. Please call pt back.  Thanks, [employer]

## 2018-07-24 NOTE — Telephone Encounter (Signed)
LMTCB 07/24/2018  Thanks,   -Deagan Sevin  

## 2018-07-24 NOTE — Telephone Encounter (Signed)
-----   Message from Malva Limes, MD sent at 07/23/2018  5:18 PM EST ----- Culture shows UTI. Have sent prescription for fosphomycin to [facility].

## 2018-07-25 NOTE — Telephone Encounter (Signed)
Advised patient of results.  

## 2018-08-04 ENCOUNTER — Telehealth: Payer: Self-pay | Admitting: Family Medicine

## 2018-08-04 NOTE — Telephone Encounter (Signed)
Needing a Rx of catheters called into [facility] - [phone].  Thanks, [employer]

## 2018-08-05 NOTE — Telephone Encounter (Signed)
Pt calling back on the status of the catheters.  Thanks, [employer]

## 2018-08-06 DIAGNOSIS — R339 Retention of urine, unspecified: Secondary | ICD-10-CM | POA: Diagnosis not present

## 2018-08-06 NOTE — Telephone Encounter (Signed)
Need to know how many catheters he uses a day and quantity he needs. OK to call in order.

## 2018-08-06 NOTE — Telephone Encounter (Signed)
Pt states he uses: 8 Catheters a day  240 for 30 days.    Thanks,   -Vernona RiegerLaura

## 2018-08-06 NOTE — Telephone Encounter (Signed)
I called [facility] and spoke with patients supply specialist Dawn. I tried calling in the order as stated below by Vernona RiegerLaura, but per Con-wayDawn patient's insurance will only cover 200 catheters per month, which would come out to be about 7 catheters daily. Dawn says the last order filled was for 6 catheters daily with a quantity of 180.  I gave a verbal order for 7 catheters daily with a qty of 200. Dawn says she would be faxing over an order for Dr. Sherrie MustacheFisher to sign. I called patient and advised him that the catheters have been called in.

## 2018-08-06 NOTE — Telephone Encounter (Signed)
LVMTRC 

## 2018-08-20 ENCOUNTER — Other Ambulatory Visit: Payer: Self-pay | Admitting: Family Medicine

## 2018-08-20 DIAGNOSIS — G894 Chronic pain syndrome: Secondary | ICD-10-CM

## 2018-08-20 NOTE — Telephone Encounter (Deleted)
alprazolam (XANAX) 2 MG tablet   Qty: 120  Last fill date: 07/16/2018

## 2018-08-20 NOTE — Telephone Encounter (Addendum)
[facility] faxed refill request for the following medications:  methadone (DOLOPHINE) 10 MG tablet  Qty: 240  Last fill date: 07/17/2018   alprazolam Prudy Feeler(XANAX) 2 MG tablet    Qty: 120  Last fill date: 07/16/2018  oxyCODONE (ROXICODONE) 15 MG immediate release tablet  Qty: 120  Last fill date: 07/17/2018   Please advise.

## 2018-08-21 MED ORDER — OXYCODONE HCL 15 MG PO TABS: ORAL_TABLET | ORAL | 0 refills | Status: DC

## 2018-08-21 MED ORDER — METHADONE HCL 10 MG PO TABS: ORAL_TABLET | ORAL | 0 refills | Status: DC

## 2018-08-21 MED ORDER — ALPRAZOLAM 2 MG PO TABS: 2.0000 mg | ORAL_TABLET | Freq: Four times a day (QID) | ORAL | 1 refills | Status: DC | PRN

## 2018-09-13 ENCOUNTER — Other Ambulatory Visit: Payer: Self-pay | Admitting: Family Medicine

## 2018-09-13 DIAGNOSIS — G894 Chronic pain syndrome: Secondary | ICD-10-CM

## 2018-09-14 NOTE — Telephone Encounter (Signed)
Last refilled on 08-21-2018

## 2018-09-24 ENCOUNTER — Ambulatory Visit: Payer: Self-pay | Admitting: Family Medicine

## 2018-09-24 ENCOUNTER — Telehealth (INDEPENDENT_AMBULATORY_CARE_PROVIDER_SITE_OTHER): Payer: Medicare HMO | Admitting: Family Medicine

## 2018-09-24 DIAGNOSIS — R339 Retention of urine, unspecified: Secondary | ICD-10-CM | POA: Diagnosis not present

## 2018-09-24 DIAGNOSIS — N390 Urinary tract infection, site not specified: Secondary | ICD-10-CM | POA: Diagnosis not present

## 2018-09-24 LAB — POCT URINALYSIS DIPSTICK
Blood, UA: NEGATIVE
Glucose, UA: NEGATIVE
KETONES UA: NEGATIVE
Nitrite, UA: NEGATIVE
PH UA: 6 (ref 5.0–8.0)
Protein, UA: POSITIVE — AB
Spec Grav, UA: 1.025 (ref 1.010–1.025)
UROBILINOGEN UA: 0.2 U/dL

## 2018-09-24 NOTE — Telephone Encounter (Signed)
That's fine

## 2018-09-24 NOTE — Telephone Encounter (Signed)
Is it ok for patient to drop off a urine sample?

## 2018-09-24 NOTE — Telephone Encounter (Signed)
Urine sample was brought in and sent to lab for culture. POCT UA was also done. Please review results.

## 2018-09-24 NOTE — Telephone Encounter (Signed)
Pt's mother will be dropping of a urine specimen this morning for pt.  Thanks, [employer]

## 2018-09-26 ENCOUNTER — Telehealth: Payer: Self-pay

## 2018-09-26 LAB — URINE CULTURE

## 2018-09-26 NOTE — Telephone Encounter (Signed)
Pt calling on results from Urine lab.  Thanks, [employer]

## 2018-09-26 NOTE — Telephone Encounter (Signed)
Advised patient of results.  

## 2018-09-26 NOTE — Telephone Encounter (Signed)
-----   Message from Malva Limesonald E Fisher, MD sent at 09/26/2018  3:08 PM EST ----- Urine culture does not indicate infection.

## 2018-09-26 NOTE — Telephone Encounter (Signed)
lmtcb

## 2018-09-28 NOTE — Telephone Encounter (Signed)
See result note.  

## 2018-10-13 ENCOUNTER — Other Ambulatory Visit: Payer: Self-pay | Admitting: Family Medicine

## 2018-10-15 ENCOUNTER — Other Ambulatory Visit: Payer: Self-pay | Admitting: Family Medicine

## 2018-10-15 DIAGNOSIS — G894 Chronic pain syndrome: Secondary | ICD-10-CM

## 2018-10-24 DIAGNOSIS — R339 Retention of urine, unspecified: Secondary | ICD-10-CM | POA: Diagnosis not present

## 2018-10-27 ENCOUNTER — Telehealth: Payer: Self-pay | Admitting: Family Medicine

## 2018-10-27 MED ORDER — FESOTERODINE FUMARATE ER 8 MG PO TB24: 8.0000 mg | ORAL_TABLET | Freq: Every day | ORAL | 11 refills | Status: DC

## 2018-10-27 NOTE — Telephone Encounter (Signed)
Pt advised.  RX Sent to [facility] and canceled Vesicare.   Thanks,   -Vernona Rieger

## 2018-10-27 NOTE — Telephone Encounter (Signed)
-----   Message from Thalia Bloodgood sent at 10/14/2018  9:57 AM EST ----- Incoming Fax needs reviewing: Thanks TNP

## 2018-10-27 NOTE — Telephone Encounter (Signed)
Please advise patient we received  letter from his insurance that Vesicare is not covered. They do cover Gala Murdoch which is very similar.  Need to try Toviaz 8mg  one tablet daily to take in its place. May send in #30 with 11 refills and advise pharmacy to cancel Vesicare refills.

## 2018-11-25 DIAGNOSIS — R339 Retention of urine, unspecified: Secondary | ICD-10-CM | POA: Diagnosis not present

## 2018-12-08 ENCOUNTER — Other Ambulatory Visit: Payer: Self-pay | Admitting: Family Medicine

## 2018-12-08 DIAGNOSIS — G894 Chronic pain syndrome: Secondary | ICD-10-CM

## 2018-12-25 DIAGNOSIS — R339 Retention of urine, unspecified: Secondary | ICD-10-CM | POA: Diagnosis not present

## 2019-01-08 ENCOUNTER — Other Ambulatory Visit: Payer: Self-pay | Admitting: Family Medicine

## 2019-01-12 ENCOUNTER — Other Ambulatory Visit: Payer: Self-pay | Admitting: Family Medicine

## 2019-01-12 DIAGNOSIS — G894 Chronic pain syndrome: Secondary | ICD-10-CM

## 2019-01-12 DIAGNOSIS — F988 Other specified behavioral and emotional disorders with onset usually occurring in childhood and adolescence: Secondary | ICD-10-CM

## 2019-01-12 NOTE — Telephone Encounter (Signed)
Pt needing refilled today on:  methadone (DOLOPHINE) 10 MG tablet oxyCODONE (ROXICODONE) 15 MG immediate release tablet alprazolam (XANAX) 2 MG tablet amphetamine-dextroamphetamine (ADDERALL) 15 MG tablet  Pt cannot drive after dark.  Needing these filled today.  Please fill at: [facility] - [place], [place] - [address] [phone] (Phone) [phone] (Fax)   Thanks, [facility]

## 2019-01-12 NOTE — Telephone Encounter (Signed)
Please review. Thanks!  

## 2019-01-13 MED ORDER — METHADONE HCL 10 MG PO TABS: ORAL_TABLET | ORAL | 0 refills | Status: DC

## 2019-01-13 MED ORDER — AMPHETAMINE-DEXTROAMPHETAMINE 15 MG PO TABS: 1.0000 | ORAL_TABLET | Freq: Two times a day (BID) | ORAL | 0 refills | Status: DC

## 2019-01-13 MED ORDER — ALPRAZOLAM 2 MG PO TABS: 2.0000 mg | ORAL_TABLET | Freq: Four times a day (QID) | ORAL | 1 refills | Status: DC | PRN

## 2019-01-13 MED ORDER — OXYCODONE HCL 15 MG PO TABS: ORAL_TABLET | ORAL | 0 refills | Status: DC

## 2019-01-22 DIAGNOSIS — R339 Retention of urine, unspecified: Secondary | ICD-10-CM | POA: Diagnosis not present

## 2019-02-04 ENCOUNTER — Other Ambulatory Visit: Payer: Self-pay | Admitting: Family Medicine

## 2019-02-10 ENCOUNTER — Telehealth (INDEPENDENT_AMBULATORY_CARE_PROVIDER_SITE_OTHER): Payer: Medicare HMO | Admitting: Family Medicine

## 2019-02-10 DIAGNOSIS — N390 Urinary tract infection, site not specified: Secondary | ICD-10-CM | POA: Diagnosis not present

## 2019-02-10 LAB — POCT URINALYSIS DIPSTICK
Blood, UA: NEGATIVE
Glucose, UA: NEGATIVE
Ketones, UA: NEGATIVE
Nitrite, UA: POSITIVE
Protein, UA: POSITIVE — AB
Spec Grav, UA: 1.01 (ref 1.010–1.025)
Urobilinogen, UA: 1 E.U./dL
pH, UA: 8 (ref 5.0–8.0)

## 2019-02-10 NOTE — Telephone Encounter (Signed)
Pt wants to bring a Urine by for a possible UTI.  Please ok for this to be done.  Barth Kirks

## 2019-02-10 NOTE — Addendum Note (Signed)
Addended by: Benjiman Core on: 02/10/2019 03:50 PM   Modules accepted: Orders

## 2019-02-10 NOTE — Telephone Encounter (Signed)
Patient states his mother will bring sample by office.

## 2019-02-10 NOTE — Telephone Encounter (Signed)
Tried calling patient on cell phone. Left detailed message (ok per DPR) that he could bring a sample by the office.

## 2019-02-10 NOTE — Telephone Encounter (Signed)
Urine sample brought in. He reports his symptoms are: bladder spasms and foul smelling urine.  Please review results from POCT urinalysis.Patient  took AZO which caused pads on test strip to be stained an off color. Patient wants to know if you will prescribe a bladder spasm medication also. He has tried [facility] in the past.  Pharmacy: [facility].

## 2019-02-10 NOTE — Telephone Encounter (Signed)
That's fine

## 2019-02-11 MED ORDER — FLAVOXATE HCL 100 MG PO TABS: 100.0000 mg | ORAL_TABLET | Freq: Three times a day (TID) | ORAL | 0 refills | Status: DC | PRN

## 2019-02-11 NOTE — Addendum Note (Signed)
Addended by: Malva Limes on: 02/11/2019 03:08 PM   Modules accepted: Orders

## 2019-02-12 ENCOUNTER — Telehealth: Payer: Self-pay

## 2019-02-12 ENCOUNTER — Other Ambulatory Visit: Payer: Self-pay | Admitting: Family Medicine

## 2019-02-12 LAB — URINE CULTURE

## 2019-02-12 NOTE — Telephone Encounter (Signed)
LMTCB

## 2019-02-12 NOTE — Telephone Encounter (Signed)
-----   Message from Malva Limes, MD sent at 02/12/2019  2:12 PM EDT ----- uti is sensitive to levofloxacin. Please start levofloxacin 750mg  once daily for 7 days.

## 2019-02-13 ENCOUNTER — Other Ambulatory Visit: Payer: Self-pay | Admitting: Family Medicine

## 2019-02-13 DIAGNOSIS — F988 Other specified behavioral and emotional disorders with onset usually occurring in childhood and adolescence: Secondary | ICD-10-CM

## 2019-02-13 DIAGNOSIS — G894 Chronic pain syndrome: Secondary | ICD-10-CM

## 2019-02-13 MED ORDER — LEVOFLOXACIN 750 MG PO TABS: 750.0000 mg | ORAL_TABLET | Freq: Every day | ORAL | 0 refills | Status: DC

## 2019-02-13 MED ORDER — OXYCODONE HCL 15 MG PO TABS: ORAL_TABLET | ORAL | 0 refills | Status: DC

## 2019-02-13 MED ORDER — AMPHETAMINE-DEXTROAMPHETAMINE 15 MG PO TABS: 1.0000 | ORAL_TABLET | Freq: Two times a day (BID) | ORAL | 0 refills | Status: DC

## 2019-02-13 MED ORDER — METHADONE HCL 10 MG PO TABS: ORAL_TABLET | ORAL | 0 refills | Status: DC

## 2019-02-13 NOTE — Telephone Encounter (Deleted)
e

## 2019-02-13 NOTE — Telephone Encounter (Signed)
Advised patient of results. Medication was sent into the pharmacy.  

## 2019-02-13 NOTE — Telephone Encounter (Addendum)
[facility] faxed refill request for the following medications:  methadone (DOLOPHINE) 10 MG tablet  oxyCODONE (ROXICODONE) 15 MG immediate release tablet   amphetamine-dextroamphetamine (ADDERALL) 15 MG tablet     Please advise.

## 2019-02-16 DIAGNOSIS — R339 Retention of urine, unspecified: Secondary | ICD-10-CM | POA: Diagnosis not present

## 2019-02-18 ENCOUNTER — Other Ambulatory Visit: Payer: Self-pay | Admitting: Family Medicine

## 2019-02-18 DIAGNOSIS — R110 Nausea: Secondary | ICD-10-CM

## 2019-02-18 MED ORDER — PROMETHAZINE HCL 25 MG RE SUPP: 25.0000 mg | Freq: Four times a day (QID) | RECTAL | 1 refills | Status: DC | PRN

## 2019-02-18 NOTE — Progress Notes (Signed)
Received fax from [facility] patient is requesting suppositories due to difficulty keeping oral tablets down.

## 2019-02-27 ENCOUNTER — Telehealth: Payer: Self-pay

## 2019-02-27 DIAGNOSIS — N390 Urinary tract infection, site not specified: Secondary | ICD-10-CM | POA: Diagnosis not present

## 2019-02-27 NOTE — Telephone Encounter (Signed)
That's fine, can do u/a and order urine culture

## 2019-02-27 NOTE — Telephone Encounter (Signed)
Patient request permission to drop off a urine sample today. He is still having bladder spasms, leg spam's and burning in his bladder. Please call his mom to let her know if it is ok. [phone].

## 2019-02-27 NOTE — Telephone Encounter (Signed)
Order has been placed in. KW 

## 2019-02-28 LAB — URINALYSIS
Bilirubin, UA: NEGATIVE
Glucose, UA: NEGATIVE
Ketones, UA: NEGATIVE
Nitrite, UA: POSITIVE — AB
RBC, UA: NEGATIVE
Specific Gravity, UA: >=1.03 — AB (ref 1.005–1.030)
Urobilinogen, Ur: 1 mg/dL (ref 0.2–1.0)
pH, UA: 5 (ref 5.0–7.5)

## 2019-03-01 LAB — URINE CULTURE: Organism ID, Bacteria: NO GROWTH

## 2019-03-02 ENCOUNTER — Telehealth: Payer: Self-pay

## 2019-03-02 NOTE — Telephone Encounter (Signed)
-----   Message from Birdie Sons, MD sent at 03/02/2019  8:08 AM EDT ----- Urine culture is negative. No sign of infection

## 2019-03-02 NOTE — Telephone Encounter (Signed)
LMTCB 03/02/2019  Thanks,   -Silvester Reierson  

## 2019-03-03 NOTE — Telephone Encounter (Signed)
Patients mom Justin [name] advised (ok per DPR).

## 2019-03-06 ENCOUNTER — Telehealth: Payer: Self-pay | Admitting: Family Medicine

## 2019-03-06 ENCOUNTER — Other Ambulatory Visit: Payer: Self-pay

## 2019-03-06 DIAGNOSIS — N390 Urinary tract infection, site not specified: Secondary | ICD-10-CM | POA: Diagnosis not present

## 2019-03-06 MED ORDER — FOSFOMYCIN TROMETHAMINE 3 G PO PACK: PACK | ORAL | 0 refills | Status: AC

## 2019-03-06 NOTE — Progress Notes (Signed)
Ok to order per Dr. Caryn Section.

## 2019-03-06 NOTE — Telephone Encounter (Signed)
Pt called saying if he does not answer please call his mom at [phone]  Thanks teri

## 2019-03-06 NOTE — Telephone Encounter (Signed)
pt is having bladder spasms, cloudy, odor to his urine.  He wants to drop off a sample of urine to be tested for UTI.  His mom will drop off the specimen today.  CB#  [phone]  teri

## 2019-03-07 ENCOUNTER — Other Ambulatory Visit: Payer: Self-pay | Admitting: Family Medicine

## 2019-03-07 DIAGNOSIS — F339 Major depressive disorder, recurrent, unspecified: Secondary | ICD-10-CM

## 2019-03-11 ENCOUNTER — Other Ambulatory Visit: Payer: Self-pay | Admitting: Family Medicine

## 2019-03-11 ENCOUNTER — Telehealth: Payer: Self-pay

## 2019-03-11 MED ORDER — CEFDINIR 300 MG PO CAPS: 600.0000 mg | ORAL_CAPSULE | Freq: Every day | ORAL | 0 refills | Status: DC

## 2019-03-11 NOTE — Telephone Encounter (Signed)
See result urine culture.

## 2019-03-11 NOTE — Telephone Encounter (Signed)
Patient is requesting a call back regarding urine culture results. Patient was advised of results. He states he wants to be sure the medication completely treats UTI this time.

## 2019-03-12 NOTE — Telephone Encounter (Signed)
Dr. Caryn Section, patient called wanting to know if he could have the current antibiotic extended for longer than 7 days for UTI. He says the Cefdinir prescription was only written for 7 days. You documented that he should take 600mg  for 10 days. Please advise if patient should be on a 7 day course on 10 day course of antibiotics. Pharmacy:  [facility]

## 2019-03-12 NOTE — Telephone Encounter (Signed)
Only needs to take 7 days to cure UTI. Result should have said 7 days

## 2019-03-13 ENCOUNTER — Other Ambulatory Visit: Payer: Self-pay | Admitting: Family Medicine

## 2019-03-13 LAB — URINE CULTURE

## 2019-03-17 ENCOUNTER — Other Ambulatory Visit: Payer: Self-pay | Admitting: Family Medicine

## 2019-03-17 DIAGNOSIS — G894 Chronic pain syndrome: Secondary | ICD-10-CM

## 2019-03-18 DIAGNOSIS — R339 Retention of urine, unspecified: Secondary | ICD-10-CM | POA: Diagnosis not present

## 2019-04-11 ENCOUNTER — Other Ambulatory Visit: Payer: Self-pay | Admitting: Family Medicine

## 2019-04-11 DIAGNOSIS — G894 Chronic pain syndrome: Secondary | ICD-10-CM

## 2019-04-11 DIAGNOSIS — F988 Other specified behavioral and emotional disorders with onset usually occurring in childhood and adolescence: Secondary | ICD-10-CM

## 2019-04-20 ENCOUNTER — Telehealth: Payer: Self-pay | Admitting: Family Medicine

## 2019-04-20 DIAGNOSIS — R339 Retention of urine, unspecified: Secondary | ICD-10-CM | POA: Diagnosis not present

## 2019-04-20 NOTE — Telephone Encounter (Signed)
That's fine

## 2019-04-20 NOTE — Telephone Encounter (Signed)
Pt needing to drop off a urine specimen.  Please call pt back to let him know if this will be ok.  Please advise.  Thanks, [employer]

## 2019-04-20 NOTE — Telephone Encounter (Signed)
Pt advised.   Thanks,   -Laura  

## 2019-04-21 ENCOUNTER — Telehealth (INDEPENDENT_AMBULATORY_CARE_PROVIDER_SITE_OTHER): Payer: Medicare HMO

## 2019-04-21 DIAGNOSIS — N390 Urinary tract infection, site not specified: Secondary | ICD-10-CM | POA: Diagnosis not present

## 2019-04-21 DIAGNOSIS — R319 Hematuria, unspecified: Secondary | ICD-10-CM

## 2019-04-21 LAB — POCT URINALYSIS DIPSTICK
Bilirubin, UA: NEGATIVE
Glucose, UA: NEGATIVE
Ketones, UA: NEGATIVE
Nitrite, UA: NEGATIVE
Protein, UA: POSITIVE — AB
Spec Grav, UA: >=1.03 — AB (ref 1.010–1.025)
Urobilinogen, UA: 0.2 E.U./dL
pH, UA: 6 (ref 5.0–8.0)

## 2019-04-21 NOTE — Telephone Encounter (Signed)
Patient's mom dropped off a urine sample. Please review results. I sent urine off to lab for culture also. Patient uses [facility].

## 2019-04-21 NOTE — Telephone Encounter (Signed)
Pt's Mom will be bring urine specimen by office.  Thanks, [employer]

## 2019-04-23 ENCOUNTER — Telehealth: Payer: Self-pay

## 2019-04-23 DIAGNOSIS — N390 Urinary tract infection, site not specified: Secondary | ICD-10-CM

## 2019-04-23 LAB — URINE CULTURE

## 2019-04-23 MED ORDER — AMOXICILLIN-POT CLAVULANATE 875-125 MG PO TABS: 1.0000 | ORAL_TABLET | Freq: Two times a day (BID) | ORAL | 0 refills | Status: AC

## 2019-04-23 NOTE — Telephone Encounter (Signed)
-----   Message from Birdie Sons, MD sent at 04/23/2019 12:55 PM EDT ----- Culture shows low bacterial count. If having UTI sx then can start Augmentin 875 twice daily for 7 days. If no sx then no need to take antibiotic.

## 2019-04-23 NOTE — Telephone Encounter (Signed)
Patient called requesting results of urine culture. Patient was notified. He is still having a very strong odor in urine. Would like to start antibiotics. Medication sent to pharmacy.

## 2019-05-05 ENCOUNTER — Telehealth: Payer: Self-pay | Admitting: Family Medicine

## 2019-05-05 DIAGNOSIS — N390 Urinary tract infection, site not specified: Secondary | ICD-10-CM

## 2019-05-05 NOTE — Telephone Encounter (Signed)
Pt calling back to check if he can bring in a urine sample.  Wanting to know if he has a bladder infection - having symptoms.  Call back asap please.  Thanks, [employer]

## 2019-05-05 NOTE — Telephone Encounter (Signed)
LMTCB 05/05/2019  Thanks,   -Mickel Baas

## 2019-05-05 NOTE — Telephone Encounter (Signed)
Pt needing to bring in a urine sample.  Please call pt back to let him know on this.  Thanks, [employer]

## 2019-05-05 NOTE — Telephone Encounter (Signed)
That's fine

## 2019-05-06 DIAGNOSIS — N390 Urinary tract infection, site not specified: Secondary | ICD-10-CM | POA: Diagnosis not present

## 2019-05-06 NOTE — Telephone Encounter (Signed)
Pt's mom dropped off urine.  I was unable to dip it secondary to Azo, but will send for culture.  Pt's mom states that the last antibiotic does not seem to be helping.  He was prescribed Augmentin 04/23/2019.   Thanks,   -Mickel Baas

## 2019-05-07 ENCOUNTER — Other Ambulatory Visit: Payer: Self-pay | Admitting: Family Medicine

## 2019-05-07 DIAGNOSIS — K5909 Other constipation: Secondary | ICD-10-CM

## 2019-05-08 ENCOUNTER — Other Ambulatory Visit: Payer: Self-pay | Admitting: Family Medicine

## 2019-05-08 ENCOUNTER — Telehealth: Payer: Self-pay | Admitting: Family Medicine

## 2019-05-08 ENCOUNTER — Telehealth: Payer: Self-pay

## 2019-05-08 DIAGNOSIS — F988 Other specified behavioral and emotional disorders with onset usually occurring in childhood and adolescence: Secondary | ICD-10-CM

## 2019-05-08 DIAGNOSIS — G894 Chronic pain syndrome: Secondary | ICD-10-CM

## 2019-05-08 LAB — CULTURE, URINE COMPREHENSIVE

## 2019-05-08 MED ORDER — CEFDINIR 300 MG PO CAPS: ORAL_CAPSULE | ORAL | 0 refills | Status: AC

## 2019-05-08 NOTE — Telephone Encounter (Signed)
Pt advised.  Pt wanted to know if it is the same infection from the last three times.  He is wondering if he needs IV antibiotics to clear the infection.    Please advise.   Thanks,   -Mickel Baas

## 2019-05-08 NOTE — Telephone Encounter (Signed)
Pt called asking if we have gotten his results back from his UA   CB# [phone]  Con [employer]

## 2019-05-08 NOTE — Telephone Encounter (Signed)
-----   Message from Birdie Sons, MD sent at 05/08/2019  4:37 PM EDT ----- Culture shows moderate number of e.coli. will send prescription for cefdinir which she clear bacteria from system.

## 2019-05-08 NOTE — Telephone Encounter (Signed)
See result note.  

## 2019-05-09 NOTE — Telephone Encounter (Signed)
Cultures grew e.coli which is sensitive to oral cefdinir. There are no IV antibiotics that would work any better. He should follow up with his urologist to see if there is something that can be done to prevent infections from coming back.

## 2019-05-11 NOTE — Telephone Encounter (Signed)
Pt advised.  He states he is going to start cefdinir today.   Thanks,   -Mickel Baas

## 2019-05-20 DIAGNOSIS — R339 Retention of urine, unspecified: Secondary | ICD-10-CM | POA: Diagnosis not present

## 2019-05-27 ENCOUNTER — Other Ambulatory Visit: Payer: Self-pay | Admitting: Family Medicine

## 2019-05-28 ENCOUNTER — Telehealth: Payer: Self-pay | Admitting: Family Medicine

## 2019-05-28 NOTE — Telephone Encounter (Signed)
Pt needing to bring in a urine specimen today.  Please call pt back to let him know when.  Thanks, [employer]

## 2019-05-28 NOTE — Telephone Encounter (Signed)
Needs virtual or telephone visit but can drop off urine specimen.

## 2019-05-28 NOTE — Telephone Encounter (Signed)
Pt needs a virtual visit scheduled today.  Can put him on with Jenni at 1:40 today.   Left message for pt to call back.  Please schedule when he calls back.   Thanks,   -Mickel Baas

## 2019-05-28 NOTE — Telephone Encounter (Signed)
Is it okay to drop of a urine sample?   Thanks,  -Mickel Baas

## 2019-05-29 ENCOUNTER — Ambulatory Visit (INDEPENDENT_AMBULATORY_CARE_PROVIDER_SITE_OTHER): Payer: Medicare HMO | Admitting: Physician Assistant

## 2019-05-29 ENCOUNTER — Other Ambulatory Visit: Payer: Self-pay

## 2019-05-29 DIAGNOSIS — N3090 Cystitis, unspecified without hematuria: Secondary | ICD-10-CM | POA: Diagnosis not present

## 2019-05-29 DIAGNOSIS — N390 Urinary tract infection, site not specified: Secondary | ICD-10-CM

## 2019-05-29 MED ORDER — AMOXICILLIN-POT CLAVULANATE 875-125 MG PO TABS: 1.0000 | ORAL_TABLET | Freq: Two times a day (BID) | ORAL | 0 refills | Status: AC

## 2019-05-29 NOTE — Progress Notes (Signed)
Patient: Justin [name] Male    DOB: [birth year]   38 y.o.   MRN: [id number] Visit Date: 05/29/2019  Today's Provider: Trey SailorsAdriana M Pollak, PA-C   Chief Complaint  Patient presents with  . Urinary Tract Infection   Subjective:    Virtual Visit via Telephone Note  I connected with Justin RinneJoseph Nathaniel Alban on 05/29/19 at  9:40 AM EDT by telephone and verified that I am speaking with the correct person using two identifiers.  Location: Patient: Home Provider: Office    I discussed the limitations, risks, security and privacy concerns of performing an evaluation and management service by telephone and the availability of in person appointments. I also discussed with the patient that there may be a patient responsible charge related to this service. The patient expressed understanding and agreed to proceed.     I provided 25 minutes of non-face-to-face time during this encounter.    Patient reports severe leg spasms, due to UTI symptoms. Patient reports that he has been having UTI symptoms for the last 3 months.  Urinary Tract Infection  This is a recurrent problem. The current episode started yesterday. The problem occurs every urination. The problem has been gradually worsening. The quality of the pain is described as burning. The pain is at a severity of 10/10. The pain is severe. Maximum temperature: low grade temp. Associated symptoms include chills. Pertinent negatives include no hematuria. Treatments tried: AZO. The treatment provided mild relief. His past medical history is significant for catheterization.    Patient with history of recurrent UTI, spinal cord injury and routine I&O catheterization. Patient additionally reporting painful spasms which happens sometimes when he gets a UTI. He is currently on methadone 20 mg QID and oxycodone 15 mg QID. He denies fevers, hcills, nausea, vomiting.   Allergies  Allergen Reactions  . Azithromycin     throat closes  .  Iodine   . Lorazepam     extremely hostile  . Meperidine   . Morphine Sulfate     Psychotic symptoms     Current Outpatient Medications:  .  albuterol (PROVENTIL HFA;VENTOLIN HFA) 108 (90 Base) MCG/ACT inhaler, Inhale 2 puffs into the lungs every 6 (six) hours as needed for wheezing or shortness of breath., Disp: 1 Inhaler, Rfl: 0 .  alprazolam (XANAX) 2 MG tablet, TAKE ONE TABLET EVERY 6 HOURS AS NEEDED, Disp: 120 tablet, Rfl: 3 .  AMITIZA 24 MCG capsule, TAKE 1 CAPSULE BY MOUTH TWICE DAILY, Disp: 60 capsule, Rfl: 11 .  amphetamine-dextroamphetamine (ADDERALL) 15 MG tablet, TAKE ONE TABLET TWICE DAILY, Disp: 60 tablet, Rfl: 0 .  baclofen (LIORESAL) 20 MG tablet, TAKE ONE TABLET BY MOUTH FOUR TIMES DAILY, Disp: 120 each, Rfl: 12 .  clotrimazole-betamethasone (LOTRISONE) cream, Apply 1 application topically 2 (two) times daily., Disp: 30 g, Rfl: 4 .  diphenhydrAMINE (BENADRYL) 25 MG tablet, Take 1 tablet by mouth as needed., Disp: , Rfl:  .  docusate sodium (COLACE) 250 MG capsule, Take 1 capsule (250 mg total) by mouth 4 (four) times daily as needed., Disp: 120 capsule, Rfl: 3 .  dronabinol (MARINOL) 10 MG capsule, Take 1 capsule (10 mg total) by mouth every 6 (six) hours as needed., Disp: 30 capsule, Rfl: 1 .  fesoterodine (TOVIAZ) 8 MG TB24 tablet, Take 1 tablet (8 mg total) by mouth daily., Disp: 30 tablet, Rfl: 11 .  flavoxATE (URISPAS) 100 MG tablet, Take 1 tablet (100 mg total) by mouth 3 (three) times  daily as needed for bladder spasms., Disp: 15 tablet, Rfl: 0 .  fluticasone (FLONASE) 50 MCG/ACT nasal spray, Place 2 sprays into both nostrils daily., Disp: 16 g, Rfl: 6 .  gabapentin (NEURONTIN) 800 MG tablet, Take 1 tablet (800 mg total) by mouth 4 (four) times daily., Disp: 120 tablet, Rfl: 5 .  levothyroxine (SYNTHROID) 75 MCG tablet, TAKE 1 TABLET EVERY DAY ON EMPTY STOMACHWITH A GLASS OF WATER AT LEAST 30-60 MINBEFORE BREAKFAST, Disp: 30 tablet, Rfl: 11 .  lidocaine (LMX) 4 %  cream, Apply 1 application topically 3 (three) times daily., Disp: 133 g, Rfl: 5 .  methadone (DOLOPHINE) 10 MG tablet, TAKE 2 TABLETS EVERY 6 HOURS, Disp: 240 tablet, Rfl: 0 .  Mineral Oil OIL, daily., Disp: , Rfl:  .  naproxen (NAPROSYN) 500 MG tablet, TAKE ONE TABLET TWICE DAILY, Disp: 60 tablet, Rfl: 5 .  omeprazole (PRILOSEC) 20 MG capsule, TAKE 1 CAPSULE EVERY DAY, Disp: 30 capsule, Rfl: 12 .  oxyCODONE (ROXICODONE) 15 MG immediate release tablet, TAKE ONE TABLET EVERY 6 HOURS AS NEEDED FOR PAIN, Disp: 120 tablet, Rfl: 0 .  PARoxetine (PAXIL) 30 MG tablet, TAKE 2 TABLETS BY MOUTH DAILY, Disp: 60 tablet, Rfl: 12 .  phenazopyridine (PYRIDIUM) 200 MG tablet, Take 1 tablet by mouth 3 (three) times daily., Disp: , Rfl:  .  promethazine (PHENERGAN) 25 MG suppository, Place 1 suppository (25 mg total) rectally every 6 (six) hours as needed for nausea or vomiting., Disp: 12 each, Rfl: 1 .  promethazine (PHENERGAN) 25 MG tablet, TAKE ONE TABLET BY MOUTH EVERY 4 HOURS AS NEEDED FOR NAUSEA/ VOMITING, Disp: 30 tablet, Rfl: 5 .  QC NATURA-LAX powder, TAKE 17 GRAMS IN 8OZ OF WATER OR OTHER LIQUID DAILY, Disp: 527 g, Rfl: 4 .  sildenafil (REVATIO) 20 MG tablet, Take 3 to 5 tablets two hours before intercouse on an empty stomach.  Do not take with nitrates., Disp: 50 tablet, Rfl: 3 .  silver sulfADIAZINE (SILVADENE) 1 % cream, Apply 1 application topically daily., Disp: 50 g, Rfl: 0 .  tiZANidine (ZANAFLEX) 4 MG tablet, TAKE TWO TABLETS FOUR TIMES DAILY, Disp: 240 tablet, Rfl: 3 .  VESICARE 10 MG tablet, TAKE 1 TABLET BY MOUTH DAILY, Disp: 30 tablet, Rfl: 11 .  dantrolene (DANTRIUM) 25 MG capsule, Take 1 capsule by mouth daily. Reported on 10/26/2015, Disp: , Rfl:  .  NIFEdipine (PROCARDIA) 20 MG capsule, One tablet three times a day as needed (Patient not taking: Reported on 06/17/2018), Disp: 60 capsule, Rfl: 1  Current Facility-Administered Medications:  .  methylPREDNISolone acetate (DEPO-MEDROL) injection  40 mg, 40 mg, Intramuscular, Once, Fisher, Kirstie Peri, MD  Review of Systems  Constitutional: Positive for chills.  Genitourinary: Negative for hematuria.  Musculoskeletal: Positive for myalgias.    Social History   Tobacco Use  . Smoking status: Former Research scientist (life sciences)  . Smokeless tobacco: Never Used  . Tobacco comment: Using Nicorette Gum  Substance Use Topics  . Alcohol use: Yes    Alcohol/week: 0.0 standard drinks    Comment: occasionaly-rare      Objective:   There were no vitals taken for this visit. There were no vitals filed for this visit.There is no height or weight on file to calculate BMI.   Physical Exam   No results found for any visits on 05/29/19.     Assessment & Plan    1. Cystitis  Treat as below based on prior cultures, he has been advised to drop off urine  sample. Patient expresses frustration that he was not tended to quickly enough when he called in and he has spent three days without antibiotics when he could have gotten a sample sent off. He called on 05/28/2019 at 9:37 AM and he was called at 11:12 AM on 05/28/2019 to schedule and drop off urine.   He says he doesn't know why he keeps getting these and he probably should be in the hospital to figure this out. He reports he probably should be in the hospital for somebody to figure this out. Likely, it is due to his neurogenic bladder and frequent catheterization. I suggested he see urology and he says it takes 6 months to see any urologist and because of this he is probably going to move back to [place] for medical care.  - POCT Urinalysis Dipstick - CULTURE, URINE COMPREHENSIVE - amoxicillin-clavulanate (AUGMENTIN) 875-125 MG tablet; Take 1 tablet by mouth 2 (two) times daily for 7 days.  Dispense: 14 tablet; Refill: 0  2. Recurrent UTI  - POCT Urinalysis Dipstick - CULTURE, URINE COMPREHENSIVE    I discussed the assessment and treatment plan with the patient. The patient was provided an opportunity to  ask questions and all were answered. The patient agreed with the plan and demonstrated an understanding of the instructions.   The patient was advised to call back or seek an in-person evaluation if the symptoms worsen or if the condition fails to improve as anticipated.      Trey Sailors, PA-C  [facility]

## 2019-05-29 NOTE — Patient Instructions (Signed)
Urinary Tract Infection, Adult A urinary tract infection (UTI) is an infection of any part of the urinary tract. The urinary tract includes:  The kidneys.  The ureters.  The bladder.  The urethra. These organs make, store, and get rid of pee (urine) in the body. What are the causes? This is caused by germs (bacteria) in your genital area. These germs grow and cause swelling (inflammation) of your urinary tract. What increases the risk? You are more likely to develop this condition if:  You have a small, thin tube (catheter) to drain pee.  You cannot control when you pee or poop (incontinence).  You are male, and: ? You use these methods to prevent pregnancy: ? A medicine that kills sperm (spermicide). ? A device that blocks sperm (diaphragm). ? You have low levels of a male hormone (estrogen). ? You are pregnant.  You have genes that add to your risk.  You are sexually active.  You take antibiotic medicines.  You have trouble peeing because of: ? A prostate that is bigger than normal, if you are male. ? A blockage in the part of your body that drains pee from the bladder (urethra). ? A kidney stone. ? A nerve condition that affects your bladder (neurogenic bladder). ? Not getting enough to drink. ? Not peeing often enough.  You have other conditions, such as: ? Diabetes. ? A weak disease-fighting system (immune system). ? Sickle cell disease. ? Gout. ? Injury of the spine. What are the signs or symptoms? Symptoms of this condition include:  Needing to pee right away (urgently).  Peeing often.  Peeing small amounts often.  Pain or burning when peeing.  Blood in the pee.  Pee that smells bad or not like normal.  Trouble peeing.  Pee that is cloudy.  Fluid coming from the vagina, if you are male.  Pain in the belly or lower back. Other symptoms include:  Throwing up (vomiting).  No urge to eat.  Feeling mixed up (confused).  Being tired  and grouchy (irritable).  A fever.  Watery poop (diarrhea). How is this treated? This condition may be treated with:  Antibiotic medicine.  Other medicines.  Drinking enough water. Follow these instructions at home:  Medicines  Take over-the-counter and prescription medicines only as told by your doctor.  If you were prescribed an antibiotic medicine, take it as told by your doctor. Do not stop taking it even if you start to feel better. General instructions  Make sure you: ? Pee until your bladder is empty. ? Do not hold pee for a long time. ? Empty your bladder after sex. ? Wipe from front to back after pooping if you are a male. Use each tissue one time when you wipe.  Drink enough fluid to keep your pee pale yellow.  Keep all follow-up visits as told by your doctor. This is important. Contact a doctor if:  You do not get better after 1-2 days.  Your symptoms go away and then come back. Get help right away if:  You have very bad back pain.  You have very bad pain in your lower belly.  You have a fever.  You are sick to your stomach (nauseous).  You are throwing up. Summary  A urinary tract infection (UTI) is an infection of any part of the urinary tract.  This condition is caused by germs in your genital area.  There are many risk factors for a UTI. These include having a small, thin   tube to drain pee and not being able to control when you pee or poop.  Treatment includes antibiotic medicines for germs.  Drink enough fluid to keep your pee pale yellow. This information is not intended to replace advice given to you by your health care provider. Make sure you discuss any questions you have with your health care provider. Document Released: 02/20/2008 Document Revised: 08/21/2018 Document Reviewed: 03/13/2018 Elsevier Patient Education  2020 Elsevier Inc.  

## 2019-05-31 LAB — CULTURE, URINE COMPREHENSIVE

## 2019-06-01 ENCOUNTER — Telehealth: Payer: Self-pay

## 2019-06-01 DIAGNOSIS — S24109S Unspecified injury at unspecified level of thoracic spinal cord, sequela: Secondary | ICD-10-CM

## 2019-06-01 DIAGNOSIS — N390 Urinary tract infection, site not specified: Secondary | ICD-10-CM

## 2019-06-01 DIAGNOSIS — R399 Unspecified symptoms and signs involving the genitourinary system: Secondary | ICD-10-CM

## 2019-06-01 NOTE — Telephone Encounter (Signed)
Referral to urology placed for recurrent UTI. Patient will be contacted.

## 2019-06-01 NOTE — Telephone Encounter (Signed)
-----   Message from Trinna Post, [place] sent at 06/01/2019  1:37 PM EDT ----- Urine culture shows a bacteria but generally not at a high enough amount to cause an infection. However, the antibiotic he was given would help this bacteria. If he would like to be referred to urology, please let me know.

## 2019-06-01 NOTE — Telephone Encounter (Signed)
Pt advised.  He would like a referral to Urology.   Thanks,   -Mickel Baas

## 2019-06-07 ENCOUNTER — Other Ambulatory Visit: Payer: Self-pay | Admitting: Family Medicine

## 2019-06-10 ENCOUNTER — Other Ambulatory Visit: Payer: Self-pay | Admitting: Family Medicine

## 2019-06-10 DIAGNOSIS — G894 Chronic pain syndrome: Secondary | ICD-10-CM

## 2019-06-11 ENCOUNTER — Ambulatory Visit: Payer: Medicare HMO | Admitting: Urology

## 2019-06-19 ENCOUNTER — Other Ambulatory Visit: Payer: Self-pay | Admitting: Family Medicine

## 2019-06-19 DIAGNOSIS — R339 Retention of urine, unspecified: Secondary | ICD-10-CM | POA: Diagnosis not present

## 2019-06-19 NOTE — Telephone Encounter (Signed)
Pt needs a rx for pyridium 200 mg [facility]

## 2019-06-19 NOTE — Telephone Encounter (Signed)
Please review. Thanks!  

## 2019-06-21 MED ORDER — PHENAZOPYRIDINE HCL 200 MG PO TABS: 200.0000 mg | ORAL_TABLET | Freq: Three times a day (TID) | ORAL | 5 refills | Status: DC

## 2019-06-22 ENCOUNTER — Other Ambulatory Visit: Payer: Self-pay

## 2019-06-22 ENCOUNTER — Encounter: Payer: Self-pay | Admitting: Family Medicine

## 2019-06-22 ENCOUNTER — Ambulatory Visit (INDEPENDENT_AMBULATORY_CARE_PROVIDER_SITE_OTHER): Payer: Medicare HMO | Admitting: Family Medicine

## 2019-06-22 VITALS — BP 122/76 | HR 88 | Temp 96.9°F | Resp 16

## 2019-06-22 DIAGNOSIS — Z23 Encounter for immunization: Secondary | ICD-10-CM | POA: Diagnosis not present

## 2019-06-22 DIAGNOSIS — R7401 Elevation of levels of liver transaminase levels: Secondary | ICD-10-CM

## 2019-06-22 DIAGNOSIS — N390 Urinary tract infection, site not specified: Secondary | ICD-10-CM | POA: Diagnosis not present

## 2019-06-22 DIAGNOSIS — E039 Hypothyroidism, unspecified: Secondary | ICD-10-CM | POA: Diagnosis not present

## 2019-06-22 DIAGNOSIS — F1190 Opioid use, unspecified, uncomplicated: Secondary | ICD-10-CM | POA: Diagnosis not present

## 2019-06-22 DIAGNOSIS — R69 Illness, unspecified: Secondary | ICD-10-CM | POA: Diagnosis not present

## 2019-06-22 LAB — POCT URINALYSIS DIPSTICK
Bilirubin, UA: NEGATIVE
Glucose, UA: NEGATIVE
Ketones, UA: NEGATIVE
Nitrite, UA: NEGATIVE
Protein, UA: POSITIVE — AB
Spec Grav, UA: >=1.03 — AB (ref 1.010–1.025)
Urobilinogen, UA: 0.2 E.U./dL
pH, UA: 6 (ref 5.0–8.0)

## 2019-06-22 NOTE — Patient Instructions (Signed)
.   Please review the attached list of medications and notify my office if there are any errors.   . Please bring all of your medications to every appointment so we can make sure that our medication list is the same as yours.   . Call [facility] to schedule appointment for evaluation of recurrent Urinary tract infections

## 2019-06-22 NOTE — Progress Notes (Signed)
Patient: Justin RinneJoseph Nathaniel [name] Male    DOB: September 15, 1981   38 y.o.   MRN: [id number] Visit Date: 06/22/2019  Today's Provider: Mila Merryonald Lacinda Curvin, MD   Chief Complaint  Patient presents with  . Abdominal Pain   Subjective:     Abdominal Pain This is a recurrent problem. The problem has been waxing and waning. Pain location: lower abdominal pain. The pain is moderate. The quality of the pain is burning (bladder pain).  Patient has not started Pyridium that was called in 2 days ago. Is identical to previous bladder spasm episodes related to urinary tract infections.   Allergies  Allergen Reactions  . Azithromycin     throat closes  . Iodine   . Lorazepam     extremely hostile  . Meperidine   . Morphine Sulfate     Psychotic symptoms     Current Outpatient Medications:  .  albuterol (PROVENTIL HFA;VENTOLIN HFA) 108 (90 Base) MCG/ACT inhaler, Inhale 2 puffs into the lungs every 6 (six) hours as needed for wheezing or shortness of breath., Disp: 1 Inhaler, Rfl: 0 .  alprazolam (XANAX) 2 MG tablet, TAKE ONE TABLET EVERY 6 HOURS AS NEEDED, Disp: 120 tablet, Rfl: 3 .  AMITIZA 24 MCG capsule, TAKE 1 CAPSULE BY MOUTH TWICE DAILY, Disp: 60 capsule, Rfl: 11 .  amphetamine-dextroamphetamine (ADDERALL) 15 MG tablet, TAKE ONE TABLET TWICE DAILY, Disp: 60 tablet, Rfl: 0 .  baclofen (LIORESAL) 20 MG tablet, TAKE ONE TABLET BY MOUTH FOUR TIMES DAILY, Disp: 120 each, Rfl: 12 .  clotrimazole-betamethasone (LOTRISONE) cream, Apply 1 application topically 2 (two) times daily., Disp: 30 g, Rfl: 4 .  dantrolene (DANTRIUM) 25 MG capsule, Take 1 capsule by mouth daily. Reported on 10/26/2015, Disp: , Rfl:  .  diphenhydrAMINE (BENADRYL) 25 MG tablet, Take 1 tablet by mouth as needed., Disp: , Rfl:  .  docusate sodium (COLACE) 250 MG capsule, Take 1 capsule (250 mg total) by mouth 4 (four) times daily as needed., Disp: 120 capsule, Rfl: 3 .  dronabinol (MARINOL) 10 MG capsule, Take 1 capsule (10 mg  total) by mouth every 6 (six) hours as needed., Disp: 30 capsule, Rfl: 1 .  fesoterodine (TOVIAZ) 8 MG TB24 tablet, Take 1 tablet (8 mg total) by mouth daily., Disp: 30 tablet, Rfl: 11 .  flavoxATE (URISPAS) 100 MG tablet, Take 1 tablet (100 mg total) by mouth 3 (three) times daily as needed for bladder spasms., Disp: 15 tablet, Rfl: 0 .  fluticasone (FLONASE) 50 MCG/ACT nasal spray, Place 2 sprays into both nostrils daily., Disp: 16 g, Rfl: 6 .  gabapentin (NEURONTIN) 800 MG tablet, Take 1 tablet (800 mg total) by mouth 4 (four) times daily., Disp: 120 tablet, Rfl: 5 .  levothyroxine (SYNTHROID) 75 MCG tablet, TAKE ONE TABLET ON AN EMPTY STOMACH WITHA GLASS OF WATER AT LEAST 30 TO 60 MINUTES BEFORE BREAKFAST, Disp: 30 tablet, Rfl: 11 .  lidocaine (LMX) 4 % cream, Apply 1 application topically 3 (three) times daily., Disp: 133 g, Rfl: 5 .  methadone (DOLOPHINE) 10 MG tablet, TAKE TWO TABLETS EVERY 6 HOURS, Disp: 240 tablet, Rfl: 0 .  Mineral Oil OIL, daily., Disp: , Rfl:  .  naproxen (NAPROSYN) 500 MG tablet, TAKE ONE TABLET TWICE DAILY, Disp: 60 tablet, Rfl: 5 .  NIFEdipine (PROCARDIA) 20 MG capsule, One tablet three times a day as needed, Disp: 60 capsule, Rfl: 1 .  omeprazole (PRILOSEC) 20 MG capsule, TAKE 1 CAPSULE EVERY DAY,  Disp: 30 capsule, Rfl: 12 .  oxyCODONE (ROXICODONE) 15 MG immediate release tablet, TAKE ONE TABLET EVERY 6 HOURS AS NEEDED FOR PAIN, Disp: 120 tablet, Rfl: 0 .  PARoxetine (PAXIL) 30 MG tablet, TAKE 2 TABLETS BY MOUTH DAILY, Disp: 60 tablet, Rfl: 12 .  promethazine (PHENERGAN) 25 MG suppository, Place 1 suppository (25 mg total) rectally every 6 (six) hours as needed for nausea or vomiting., Disp: 12 each, Rfl: 1 .  promethazine (PHENERGAN) 25 MG tablet, TAKE ONE TABLET BY MOUTH EVERY 4 HOURS AS NEEDED FOR NAUSEA/ VOMITING, Disp: 30 tablet, Rfl: 5 .  QC NATURA-LAX powder, TAKE 17 GRAMS IN 8OZ OF WATER OR OTHER LIQUID DAILY, Disp: 527 g, Rfl: 4 .  sildenafil (REVATIO) 20 MG  tablet, Take 3 to 5 tablets two hours before intercouse on an empty stomach.  Do not take with nitrates., Disp: 50 tablet, Rfl: 3 .  silver sulfADIAZINE (SILVADENE) 1 % cream, Apply 1 application topically daily., Disp: 50 g, Rfl: 0 .  tiZANidine (ZANAFLEX) 4 MG tablet, TAKE TWO TABLETS FOUR TIMES DAILY, Disp: 240 tablet, Rfl: 3 .  VESICARE 10 MG tablet, TAKE 1 TABLET BY MOUTH DAILY, Disp: 30 tablet, Rfl: 11 .  phenazopyridine (PYRIDIUM) 200 MG tablet, Take 1 tablet (200 mg total) by mouth 3 (three) times daily. (Patient not taking: Reported on 06/22/2019), Disp: 10 tablet, Rfl: 5  Current Facility-Administered Medications:  .  methylPREDNISolone acetate (DEPO-MEDROL) injection 40 mg, 40 mg, Intramuscular, Once, Nevayah Faust, Demetrios Isaacs, MD  Review of Systems  Gastrointestinal: Positive for abdominal pain.    Social History   Tobacco Use  . Smoking status: Former Games developer  . Smokeless tobacco: Never Used  . Tobacco comment: Using Nicorette Gum  Substance Use Topics  . Alcohol use: Yes    Alcohol/week: 0.0 standard drinks    Comment: occasionaly-rare      Objective:   BP 122/76 (BP Location: Right Arm, Patient Position: Sitting, Cuff Size: Large)   Pulse 88   Temp (!) 96.9 F (36.1 C) (Temporal)   Resp 16   SpO2 96% Comment: room air Vitals:   06/22/19 1617  BP: 122/76  Pulse: 88  Resp: 16  Temp: (!) 96.9 F (36.1 C)  TempSrc: Temporal  SpO2: 96%  There is no height or weight on file to calculate BMI.   Physical Exam  General appearance: Well developed, well nourished male, cooperative and in no acute distress Head: Normocephalic, without obvious abnormality, atraumatic Respiratory: Respirations even and unlabored, normal respiratory rate Extremities: All extremities are intact.  Skin: Skin color, texture, turgor normal. No rashes seen  Psych: Appropriate mood and affect. Neurologic: Mental status: Alert, oriented to person, place, and time, thought content appropriate.   POCT Urinalysis Dipstick  Result Value Ref Range   Color, UA amber    Clarity, UA cloudy    Glucose, UA Negative Negative   Bilirubin, UA negative    Ketones, UA negative    Spec Grav, UA >=1.030 (A) 1.010 - 1.025   Blood, UA moderate (non- hemolyzed)    pH, UA 6.0 5.0 - 8.0   Protein, UA Positive (A) Negative   Urobilinogen, UA 0.2 0.2 or 1.0 E.U./dL   Nitrite, UA negative    Leukocytes, UA Moderate (2+) (A) Negative   Appearance     Odor         Assessment & Plan    1. Recurrent UTI  - Urine Culture  2. Hypothyroidism, unspecified type  - TSH -  T4, free  3. Chronic, continuous use of opioids Unable to provide enough specimen to add drug screen today.   4. Elevated transaminase level  - Comprehensive metabolic panel  5. Need for influenza vaccination  - Flu Vaccine QUAD 6+ mos PF IM (Fluarix Quad PF)     Lelon Huh, MD  [facility]

## 2019-06-23 NOTE — Telephone Encounter (Signed)
Cannot be e-prescribed, please call into pharmacy

## 2019-06-24 ENCOUNTER — Telehealth: Payer: Self-pay

## 2019-06-24 LAB — URINE CULTURE

## 2019-06-24 NOTE — Telephone Encounter (Signed)
[id number] 

## 2019-06-24 NOTE — Telephone Encounter (Signed)
-----   Message from Birdie Sons, MD sent at 06/24/2019  7:37 AM EDT ----- Urine culture is negative, no infection Need needs to call back [facility] to schedule appointment for bladder spasms and recurrent UTIs, referral was entered last month.

## 2019-06-29 NOTE — Telephone Encounter (Signed)
Tried calling patient. Left message to call back. 

## 2019-06-30 ENCOUNTER — Telehealth: Payer: Self-pay | Admitting: Family Medicine

## 2019-06-30 NOTE — Telephone Encounter (Signed)
Pt called back again to check results  Con Memos

## 2019-06-30 NOTE — Telephone Encounter (Signed)
Pt calling back for lab urinalsis results.  Please call pt back to let him know and needing antibiotics.  Please advise.  Thanks, [employer]

## 2019-07-01 NOTE — Telephone Encounter (Signed)
Patient has been advised. KW 

## 2019-07-01 NOTE — Telephone Encounter (Signed)
[id number] 

## 2019-07-01 NOTE — Telephone Encounter (Signed)
Patient was advised but states that he is surprised culture was negative. In patients own words he states " Can you ask Dr. Caryn Section why I smell like fish down there since my culture was negative? " KW

## 2019-07-01 NOTE — Telephone Encounter (Signed)
He needs to see urologist, I don't know what else to tell him.

## 2019-07-06 NOTE — Telephone Encounter (Signed)
Left message to call back  

## 2019-07-10 NOTE — Telephone Encounter (Signed)
Unable to contact the patient

## 2019-07-14 ENCOUNTER — Ambulatory Visit (INDEPENDENT_AMBULATORY_CARE_PROVIDER_SITE_OTHER): Payer: Medicare HMO | Admitting: Family Medicine

## 2019-07-14 ENCOUNTER — Other Ambulatory Visit: Payer: Self-pay

## 2019-07-14 ENCOUNTER — Telehealth: Payer: Self-pay | Admitting: Family Medicine

## 2019-07-14 ENCOUNTER — Encounter: Payer: Self-pay | Admitting: Family Medicine

## 2019-07-14 ENCOUNTER — Other Ambulatory Visit: Payer: Self-pay | Admitting: Family Medicine

## 2019-07-14 DIAGNOSIS — N319 Neuromuscular dysfunction of bladder, unspecified: Secondary | ICD-10-CM

## 2019-07-14 DIAGNOSIS — G894 Chronic pain syndrome: Secondary | ICD-10-CM

## 2019-07-14 DIAGNOSIS — B356 Tinea cruris: Secondary | ICD-10-CM | POA: Diagnosis not present

## 2019-07-14 MED ORDER — FLAVOXATE HCL 100 MG PO TABS: 100.0000 mg | ORAL_TABLET | Freq: Three times a day (TID) | ORAL | 0 refills | Status: DC | PRN

## 2019-07-14 MED ORDER — SILVER SULFADIAZINE 1 % EX CREA: 1.0000 "application " | TOPICAL_CREAM | Freq: Two times a day (BID) | CUTANEOUS | 0 refills | Status: DC

## 2019-07-14 MED ORDER — CLOTRIMAZOLE-BETAMETHASONE 1-0.05 % EX CREA: 1.0000 "application " | TOPICAL_CREAM | Freq: Two times a day (BID) | CUTANEOUS | 4 refills | Status: DC

## 2019-07-14 NOTE — Progress Notes (Signed)
Justin [name]  MRN: [id number] DOB: Oct 22, 1980  Subjective:  HPI   The patient is a 38 year old who complains of jock itch that cleared and is now presenting as a yeast or cellulitis. He also has symptoms suggestive of UTI. Patient is paralyzed and certain symptoms are difficult to be determined.  He states that he has chronic fever that he takes Advil daily to treat.  He has to catheterize himself so he is uncertain about urgency, frequency and hesitancy. His mother brought a urine specimen by the office but due to some confusion at the front office the urine was not tested.     Patient Active Problem List   Diagnosis Date Noted  . Spinal cord injury, thoracic region ([place]) 11/15/2017  . Chronic, continuous use of opioids 04/27/2016  . Depression 11/30/2015  . Chronic hoarseness 10/26/2015  . Hypothyroidism 10/26/2015  . Elevated transaminase level 08/10/2015  . Spasticity 07/30/2015  . Paraplegia ([place]) 07/21/2015  . Bladder neurogenesis 04/28/2015  . Infection due to drug-resistant organism 03/22/2015  . Chronic UTI 03/11/2015  . Chronic pain associated with significant psychosocial dysfunction 02/18/2015  . ADD (attention deficit disorder) 02/17/2015  . Anxiety 02/17/2015  . Chronic constipation 02/17/2015  . Bed sore on buttock 02/17/2015  . Abscess, dental 02/17/2015  . Dermatitis, eczematoid 02/17/2015  . Dizziness 02/17/2015  . Esophageal reflux 02/17/2015  . Hypesthesia 02/17/2015  . Decubitus ulcer 02/17/2015  . Spasm 02/17/2015  . Concussion and swelling of spinal cord 02/17/2015  . Body tinea 02/17/2015  . GERD (gastroesophageal reflux disease) 02/17/2015   Past Medical History:  Diagnosis Date  . Arthritis   . Asthma   . Chronic anxiety   . Chronic depression   . Chronic pain syndrome    secondary to fracture T9 and L3 with extensive spinal surgeery  . Postoperative wound infection of right hip 2012  . Rheumatic fever    Social History    Socioeconomic History  . Marital status: Married    Spouse name: Not on file  . Number of children: Not on file  . Years of education: Not on file  . Highest education level: Not on file  Occupational History  . Occupation: unemployed  Social Needs  . Financial resource strain: Not on file  . Food insecurity    Worry: Not on file    Inability: Not on file  . Transportation needs    Medical: Not on file    Non-medical: Not on file  Tobacco Use  . Smoking status: Former Research scientist (life sciences)  . Smokeless tobacco: Never Used  . Tobacco comment: Using Nicorette Gum  Substance and Sexual Activity  . Alcohol use: Yes    Alcohol/week: 0.0 standard drinks    Comment: occasionaly-rare  . Drug use: No  . Sexual activity: Not on file  Lifestyle  . Physical activity    Days per week: Not on file    Minutes per session: Not on file  . Stress: Not on file  Relationships  . Social Herbalist on phone: Not on file    Gets together: Not on file    Attends religious service: Not on file    Active member of club or organization: Not on file    Attends meetings of clubs or organizations: Not on file    Relationship status: Not on file  . Intimate partner violence    Fear of current or ex partner: Not on file    Emotionally  abused: Not on file    Physically abused: Not on file    Forced sexual activity: Not on file  Other Topics Concern  . Not on file  Social History Narrative  . Not on file   Outpatient Encounter Medications as of 07/14/2019  Medication Sig Note  . albuterol (PROVENTIL HFA;VENTOLIN HFA) 108 (90 Base) MCG/ACT inhaler Inhale 2 puffs into the lungs every 6 (six) hours as needed for wheezing or shortness of breath.   . alprazolam (XANAX) 2 MG tablet TAKE ONE TABLET BY MOUTH EVERY 6 HOURS AS NEEDED   . AMITIZA 24 MCG capsule TAKE 1 CAPSULE BY MOUTH TWICE DAILY   . amphetamine-dextroamphetamine (ADDERALL) 15 MG tablet TAKE ONE TABLET TWICE DAILY   . baclofen (LIORESAL) 20 MG  tablet TAKE ONE TABLET BY MOUTH FOUR TIMES DAILY   . clotrimazole-betamethasone (LOTRISONE) cream Apply 1 application topically 2 (two) times daily.   . dantrolene (DANTRIUM) 25 MG capsule Take 1 capsule by mouth daily. Reported on 10/26/2015 02/17/2015: Received from: [facility] Received Sig:   . diphenhydrAMINE (BENADRYL) 25 MG tablet Take 1 tablet by mouth as needed. 02/17/2015: Medication taken as needed.  Received from: [facility] Received Sig:   . docusate sodium (COLACE) 250 MG capsule Take 1 capsule (250 mg total) by mouth 4 (four) times daily as needed.   . dronabinol (MARINOL) 10 MG capsule Take 1 capsule (10 mg total) by mouth every 6 (six) hours as needed.   . fesoterodine (TOVIAZ) 8 MG TB24 tablet Take 1 tablet (8 mg total) by mouth daily.   . flavoxATE (URISPAS) 100 MG tablet Take 1 tablet (100 mg total) by mouth 3 (three) times daily as needed for bladder spasms.   . fluticasone (FLONASE) 50 MCG/ACT nasal spray Place 2 sprays into both nostrils daily.   Marland Kitchen gabapentin (NEURONTIN) 800 MG tablet Take 1 tablet (800 mg total) by mouth 4 (four) times daily.   Marland Kitchen levothyroxine (SYNTHROID) 75 MCG tablet TAKE ONE TABLET ON AN EMPTY STOMACH WITHA GLASS OF WATER AT LEAST 30 TO 60 MINUTES BEFORE BREAKFAST   . lidocaine (LMX) 4 % cream Apply 1 application topically 3 (three) times daily.   . methadone (DOLOPHINE) 10 MG tablet TAKE TWO TABLETS EVERY 6 HOURS   . Mineral Oil OIL daily. 02/17/2015: Received from: [facility] Received Sig: MINERAL OIL (Oil) - Historical Medication  1-3 at bedtime, by mouth Active  . naproxen (NAPROSYN) 500 MG tablet TAKE ONE TABLET TWICE DAILY   . NIFEdipine (PROCARDIA) 20 MG capsule One tablet three times a day as needed   . omeprazole (PRILOSEC) 20 MG capsule TAKE 1 CAPSULE EVERY DAY   . oxyCODONE (ROXICODONE) 15 MG immediate release tablet TAKE ONE TABLET EVERY 6 HOURS AS NEEDED FOR PAIN   . PARoxetine (PAXIL) 30 MG  tablet TAKE 2 TABLETS BY MOUTH DAILY   . phenazopyridine (PYRIDIUM) 200 MG tablet Take 1 tablet (200 mg total) by mouth 3 (three) times daily. (Patient not taking: Reported on 06/22/2019)   . promethazine (PHENERGAN) 25 MG suppository Place 1 suppository (25 mg total) rectally every 6 (six) hours as needed for nausea or vomiting.   . promethazine (PHENERGAN) 25 MG tablet TAKE ONE TABLET BY MOUTH EVERY 4 HOURS AS NEEDED FOR NAUSEA/ VOMITING   . QC NATURA-LAX powder TAKE 17 GRAMS IN 8OZ OF WATER OR OTHER LIQUID DAILY   . sildenafil (REVATIO) 20 MG tablet Take 3 to 5 tablets two hours before intercouse  on an empty stomach.  Do not take with nitrates.   . silver sulfADIAZINE (SILVADENE) 1 % cream Apply 1 application topically daily.   Marland Kitchen. tiZANidine (ZANAFLEX) 4 MG tablet TAKE TWO TABLETS FOUR TIMES DAILY   . VESICARE 10 MG tablet TAKE 1 TABLET BY MOUTH DAILY    Facility-Administered Encounter Medications as of 07/14/2019  Medication  . methylPREDNISolone acetate (DEPO-MEDROL) injection 40 mg   Allergies  Allergen Reactions  . Azithromycin     throat closes  . Iodine   . Lorazepam     extremely hostile  . Meperidine   . Morphine Sulfate     Psychotic symptoms   Review of Systems  Constitutional: Positive for fever (chronic secondary to other issues.). Negative for chills, diaphoresis and malaise/fatigue.  HENT: Negative for congestion, ear pain, sinus pain and sore throat.   Eyes: Negative for pain, discharge and redness.  Respiratory: Negative for cough and shortness of breath.   Cardiovascular: Negative for chest pain.  Genitourinary: Positive for dysuria, flank pain (unable to tell.) and frequency. Negative for hematuria.       Odor   Musculoskeletal: Positive for myalgias.  Skin: Positive for rash (cellulitis).    Objective:  There were no vitals taken for this visit.  WDWN male in no apparent distress.  Head: Normocephalic, atraumatic. Neck: Supple, NROM Respiratory: No  apparent distress Psych: Normal mood and affect Skin: Red to tan rash on upper inner thigh near scrotum.  Assessment and Plan :   1. Tinea cruris Has a red rash with burning and itching that started 2 months ago with change in type of diapers he uses for urinary leakage. Feeling the area on the upper inner thighs has been more painful and red with a "fishy odor" the past few days. Not much relief from the Miconazole with Diaper Rash/Moisture Barrier Cream. Will switch to Lotrisone mixed with Silvadene (equal parts) to apply BID for tinea cruris with secondary infection. Should apply Zeasorb-AF Powder after applying creams to keep the area dry. Remember to wash skin and dry completely between applications. Treat for 2 weeks daily then use powder daily to prevent recurrence. - clotrimazole-betamethasone (LOTRISONE) cream; Apply 1 application topically 2 (two) times daily.  Dispense: 30 g; Refill: 4 - silver sulfADIAZINE (SILVADENE) 1 % cream; Apply 1 application topically 2 (two) times daily.  Dispense: 50 g; Refill: 0  2. Bladder neurogenesis Having a flare of bladder spasms and requests refill of Urispas. Will collect urine specimen to have mother bring to the office to check for infection with his history of using self-catheterization 3-4 times a day. - flavoxATE (URISPAS) 100 MG tablet; Take 1 tablet (100 mg total) by mouth 3 (three) times daily as needed for bladder spasms.  Dispense: 15 tablet; Refill: 0

## 2019-07-14 NOTE — Telephone Encounter (Signed)
Dr Eliberto Ivory will not take new Medicare pt's or Medicaid.Pt states he is willing to go to [facility] urological since covid testing site has been moved to different location

## 2019-07-14 NOTE — Telephone Encounter (Signed)
I called and spoke with patient. He complains of bladder spasms, jock itch,  burning of the bladder and his urine smells like fish. Patient says his mom is on her way to work and will be bringing a urine sample by the office. I advised patient that Dr. Caryn Section wanted him to see Urology. Patient states he declined that referral because [facility] is attached to the hospital and he didn't want to take a risk of being exposed to [place] since he is high risk. I advised patient that we could try referring him to a different Urologist. Patient agreed to try a different Urologist. He also request a male Dealer. I spoke with Burman Freestone and she agreed to try referring patient to Dr. Rogers Blocker.

## 2019-07-14 NOTE — Telephone Encounter (Signed)
Pt has a bladder infection.  This infection is more intense making his legs jump with cramps.  His mother will be coming by to drop off the specimen.   Thanks, [employer]

## 2019-07-14 NOTE — Telephone Encounter (Signed)
FYI: Patient is on Dennis's schedule today at 3pm for doxy visit.

## 2019-07-15 DIAGNOSIS — N319 Neuromuscular dysfunction of bladder, unspecified: Secondary | ICD-10-CM | POA: Diagnosis not present

## 2019-07-15 DIAGNOSIS — G8220 Paraplegia, unspecified: Secondary | ICD-10-CM | POA: Diagnosis not present

## 2019-07-15 NOTE — Addendum Note (Signed)
Addended by: Althea Charon D on: 07/15/2019 02:51 PM   Modules accepted: Orders

## 2019-07-17 ENCOUNTER — Telehealth: Payer: Self-pay | Admitting: Family Medicine

## 2019-07-17 ENCOUNTER — Other Ambulatory Visit: Payer: Self-pay | Admitting: Family Medicine

## 2019-07-17 DIAGNOSIS — N390 Urinary tract infection, site not specified: Secondary | ICD-10-CM

## 2019-07-17 MED ORDER — CEPHALEXIN 250 MG PO CAPS: 250.0000 mg | ORAL_CAPSULE | Freq: Four times a day (QID) | ORAL | 0 refills | Status: DC

## 2019-07-17 NOTE — Telephone Encounter (Signed)
Pt called for his results on his UC.  Pt wants to know if he could get an antibiotic for the weekend.  [facility]  Pt's CB [phone]  [facility]

## 2019-07-17 NOTE — Telephone Encounter (Signed)
Continues to have bladder spasms. With history of frequent catheterization and chronic UTI's, will give Keflex 250 mg QID #28 and awaiting urine culture report.

## 2019-07-20 ENCOUNTER — Telehealth: Payer: Self-pay

## 2019-07-20 LAB — URINE CULTURE

## 2019-07-20 NOTE — Telephone Encounter (Signed)
Attempted call to patient, VM not set up.  Keflex was sent to pharmacy, not Doxy.  When patient calls back he is to continue the Keflex, despite minimal bacteria seen on culture

## 2019-07-20 NOTE — Telephone Encounter (Signed)
-----   Message from [place], [place] sent at 07/20/2019  7:57 AM EST ----- Insufficient growth on culture to identify bacteria and do sensitivity test. Lab continues to incubate. Continue Doxycycline.

## 2019-07-21 ENCOUNTER — Telehealth: Payer: Self-pay

## 2019-07-21 DIAGNOSIS — R339 Retention of urine, unspecified: Secondary | ICD-10-CM | POA: Diagnosis not present

## 2019-07-21 NOTE — Telephone Encounter (Signed)
-----   Message from [place], [place] sent at 07/20/2019  1:09 PM EST ----- Final urine culture report did not show significant bacterial growth to indicate infection. The Keflex given should clear urinary tract and cover for any skin infection. If bladder spasms (or urinary tract symptoms) persists after finishing the antibiotic, should follow up with urology.

## 2019-07-21 NOTE — Telephone Encounter (Signed)
NA mailbox not set up

## 2019-07-22 ENCOUNTER — Telehealth: Payer: Self-pay

## 2019-07-22 NOTE — Telephone Encounter (Signed)
Patient called office requesting a call back from Justin [name], patient would not tell me much information except that his rash has not cleared and he states he will speak more in detail when CMA calls back. KW

## 2019-07-23 NOTE — Telephone Encounter (Signed)
Several attempts have been made to contact this patient.  There is no answer and he has no voicemail set up.  If he should call back please have a CMA speak with him about this and his lab results.

## 2019-07-27 ENCOUNTER — Telehealth: Payer: Self-pay

## 2019-07-27 ENCOUNTER — Other Ambulatory Visit: Payer: Self-pay | Admitting: Family Medicine

## 2019-07-27 DIAGNOSIS — B356 Tinea cruris: Secondary | ICD-10-CM

## 2019-07-27 NOTE — Telephone Encounter (Signed)
Patient called requesting referral to dermatology.  He states that the skin around the groin is now getting very dark.  The last treatment did nothing to help.  Please advise

## 2019-07-27 NOTE — Telephone Encounter (Signed)
Placed order in chart for dermatology referral for suspected tinea cruris with secondary infection of upper inner thighs in this paraplegic individual.

## 2019-07-27 NOTE — Telephone Encounter (Signed)
Pt calling back.  He said he called last Thursday and did not get a call back.  Please call pt back at [phone].   Thanks, [employer]

## 2019-07-28 NOTE — Telephone Encounter (Signed)
Multiple attempts made to contact patient.  VM is not set up

## 2019-07-29 ENCOUNTER — Telehealth: Payer: Self-pay | Admitting: Family Medicine

## 2019-07-29 NOTE — Telephone Encounter (Signed)
Advised 

## 2019-07-29 NOTE — Telephone Encounter (Signed)
Pt needing a call back regarding a dermatologist referral.  Please call pt back at [phone].  Thanks, [employer]

## 2019-07-30 ENCOUNTER — Ambulatory Visit (INDEPENDENT_AMBULATORY_CARE_PROVIDER_SITE_OTHER): Payer: Medicare HMO | Admitting: Family Medicine

## 2019-07-30 ENCOUNTER — Other Ambulatory Visit: Payer: Self-pay

## 2019-07-30 DIAGNOSIS — B356 Tinea cruris: Secondary | ICD-10-CM

## 2019-07-30 NOTE — Progress Notes (Signed)
Justin [name]  MRN: [id number] DOB: Oct 05, 1980  Subjective:  HPI   The patient is a 38 year old male who presents via electronic device for evaluation of discoloring skin.  The patient states that since his last visit the skin in his groin area has gotten dark and he would like to be seen by dermatology.   The referral department is in the process of getting him an appointment.  Patient Active Problem List   Diagnosis Date Noted  . Spinal cord injury, thoracic region ([facility]) 11/15/2017  . Chronic, continuous use of opioids 04/27/2016  . Depression 11/30/2015  . Chronic hoarseness 10/26/2015  . Hypothyroidism 10/26/2015  . Elevated transaminase level 08/10/2015  . Spasticity 07/30/2015  . Paraplegia ([facility]) 07/21/2015  . Bladder neurogenesis 04/28/2015  . Infection due to drug-resistant organism 03/22/2015  . Chronic UTI 03/11/2015  . Chronic pain associated with significant psychosocial dysfunction 02/18/2015  . ADD (attention deficit disorder) 02/17/2015  . Anxiety 02/17/2015  . Chronic constipation 02/17/2015  . Bed sore on buttock 02/17/2015  . Abscess, dental 02/17/2015  . Dermatitis, eczematoid 02/17/2015  . Dizziness 02/17/2015  . Esophageal reflux 02/17/2015  . Hypesthesia 02/17/2015  . Decubitus ulcer 02/17/2015  . Spasm 02/17/2015  . Concussion and swelling of spinal cord 02/17/2015  . Body tinea 02/17/2015  . GERD (gastroesophageal reflux disease) 02/17/2015    Past Medical History:  Diagnosis Date  . Arthritis   . Asthma   . Chronic anxiety   . Chronic depression   . Chronic pain syndrome    secondary to fracture T9 and L3 with extensive spinal surgeery  . Postoperative wound infection of right hip 2012  . Rheumatic fever    Past Surgical History:  Procedure Laterality Date  . BACK SURGERY  2000   Fracture T9 and L3 with paraplegia   Family History  Problem Relation Age of Onset  . Hypertension Father   . Gout Father   . Prostate cancer  Neg Hx   . Kidney cancer Neg Hx    Social History   Socioeconomic History  . Marital status: Married    Spouse name: Not on file  . Number of children: Not on file  . Years of education: Not on file  . Highest education level: Not on file  Occupational History  . Occupation: unemployed  Social Needs  . Financial resource strain: Not on file  . Food insecurity    Worry: Not on file    Inability: Not on file  . Transportation needs    Medical: Not on file    Non-medical: Not on file  Tobacco Use  . Smoking status: Former Games developer  . Smokeless tobacco: Never Used  . Tobacco comment: Using Nicorette Gum  Substance and Sexual Activity  . Alcohol use: Yes    Alcohol/week: 0.0 standard drinks    Comment: occasionaly-rare  . Drug use: No  . Sexual activity: Not on file  Lifestyle  . Physical activity    Days per week: Not on file    Minutes per session: Not on file  . Stress: Not on file  Relationships  . Social Musician on phone: Not on file    Gets together: Not on file    Attends religious service: Not on file    Active member of club or organization: Not on file    Attends meetings of clubs or organizations: Not on file    Relationship status: Not on file  .  Intimate partner violence    Fear of current or ex partner: Not on file    Emotionally abused: Not on file    Physically abused: Not on file    Forced sexual activity: Not on file  Other Topics Concern  . Not on file  Social History Narrative  . Not on file    Outpatient Encounter Medications as of 07/30/2019  Medication Sig Note  . albuterol (PROVENTIL HFA;VENTOLIN HFA) 108 (90 Base) MCG/ACT inhaler Inhale 2 puffs into the lungs every 6 (six) hours as needed for wheezing or shortness of breath.   . alprazolam (XANAX) 2 MG tablet TAKE ONE TABLET BY MOUTH EVERY 6 HOURS AS NEEDED   . AMITIZA 24 MCG capsule TAKE 1 CAPSULE BY MOUTH TWICE DAILY   . amphetamine-dextroamphetamine (ADDERALL) 15 MG tablet  TAKE ONE TABLET TWICE DAILY   . baclofen (LIORESAL) 20 MG tablet TAKE ONE TABLET BY MOUTH FOUR TIMES DAILY   . cephALEXin (KEFLEX) 250 MG capsule Take 1 capsule (250 mg total) by mouth 4 (four) times daily.   . clotrimazole-betamethasone (LOTRISONE) cream Apply 1 application topically 2 (two) times daily.   . dantrolene (DANTRIUM) 25 MG capsule Take 1 capsule by mouth daily. Reported on 10/26/2015 02/17/2015: [facility]: [facility] Received Sig:   . diphenhydrAMINE (BENADRYL) 25 MG tablet Take 1 tablet by mouth as needed. 02/17/2015: Medication taken as needed.  Received from: [facility] Received Sig:   . docusate sodium (COLACE) 250 MG capsule Take 1 capsule (250 mg total) by mouth 4 (four) times daily as needed.   . fesoterodine (TOVIAZ) 8 MG TB24 tablet Take 1 tablet (8 mg total) by mouth daily.   . flavoxATE (URISPAS) 100 MG tablet Take 1 tablet (100 mg total) by mouth 3 (three) times daily as needed for bladder spasms.   . fluticasone (FLONASE) 50 MCG/ACT nasal spray Place 2 sprays into both nostrils daily.   Marland Kitchen. gabapentin (NEURONTIN) 800 MG tablet Take 1 tablet (800 mg total) by mouth 4 (four) times daily.   Marland Kitchen. levothyroxine (SYNTHROID) 75 MCG tablet TAKE ONE TABLET ON AN EMPTY STOMACH WITHA GLASS OF WATER AT LEAST 30 TO 60 MINUTES BEFORE BREAKFAST   . lidocaine (LMX) 4 % cream Apply 1 application topically 3 (three) times daily.   . methadone (DOLOPHINE) 10 MG tablet TAKE TWO TABLETS EVERY 6 HOURS   . Mineral Oil OIL daily. 02/17/2015: Received from: [facility] Received Sig: MINERAL OIL (Oil) - Historical Medication  1-3 at bedtime, by mouth Active  . naproxen (NAPROSYN) 500 MG tablet TAKE ONE TABLET TWICE DAILY   . omeprazole (PRILOSEC) 20 MG capsule TAKE 1 CAPSULE EVERY DAY   . oxyCODONE (ROXICODONE) 15 MG immediate release tablet TAKE ONE TABLET EVERY 6 HOURS AS NEEDED FOR PAIN   . PARoxetine (PAXIL) 30 MG tablet TAKE 2 TABLETS BY MOUTH  DAILY   . phenazopyridine (PYRIDIUM) 200 MG tablet Take 1 tablet (200 mg total) by mouth 3 (three) times daily.   . promethazine (PHENERGAN) 25 MG suppository Place 1 suppository (25 mg total) rectally every 6 (six) hours as needed for nausea or vomiting.   . promethazine (PHENERGAN) 25 MG tablet TAKE ONE TABLET BY MOUTH EVERY 4 HOURS AS NEEDED FOR NAUSEA/ VOMITING   . QC NATURA-LAX powder TAKE 17 GRAMS IN 8OZ OF WATER OR OTHER LIQUID DAILY   . silver sulfADIAZINE (SILVADENE) 1 % cream Apply 1 application topically 2 (two) times daily.   Marland Kitchen. tiZANidine (  ZANAFLEX) 4 MG tablet TAKE TWO TABLETS FOUR TIMES DAILY   . VESICARE 10 MG tablet TAKE 1 TABLET BY MOUTH DAILY   . dronabinol (MARINOL) 10 MG capsule Take 1 capsule (10 mg total) by mouth every 6 (six) hours as needed. (Patient not taking: Reported on 07/14/2019)   . NIFEdipine (PROCARDIA) 20 MG capsule One tablet three times a day as needed (Patient not taking: Reported on 07/14/2019)   . sildenafil (REVATIO) 20 MG tablet Take 3 to 5 tablets two hours before intercouse on an empty stomach.  Do not take with nitrates. (Patient not taking: Reported on 07/14/2019)    Facility-Administered Encounter Medications as of 07/30/2019  Medication  . methylPREDNISolone acetate (DEPO-MEDROL) injection 40 mg    Allergies  Allergen Reactions  . Azithromycin     throat closes  . Iodine   . Lorazepam     extremely hostile  . Meperidine   . Morphine Sulfate     Psychotic symptoms   Review of Systems  Skin: Positive for rash.     Objective:  There were no vitals taken for this visit.  WDWN male in no apparent distress.  Head: Normocephalic, atraumatic. Neck: Supple, NROM Respiratory: No apparent distress Psych: Normal mood and affect Skin: Rash in the upper inner thighs has become tan with resolved erythema.  Assessment and Plan :  1. Tinea cruris Resolving rash on upper inner thighs without erythema now. Some tan coloration without discomfort.  Recommend he continue Moisture Barrier Cream with Miconazole on clean skin twice a day then sprinkle with Zeasorb-AF Powder. Wants to proceed with dermatology evaluation. Advised appointment scheduled and the dermatologist will contact him regarding the day and time of the visit.

## 2019-08-10 ENCOUNTER — Telehealth (INDEPENDENT_AMBULATORY_CARE_PROVIDER_SITE_OTHER): Payer: Medicare HMO | Admitting: Family Medicine

## 2019-08-10 DIAGNOSIS — N390 Urinary tract infection, site not specified: Secondary | ICD-10-CM | POA: Diagnosis not present

## 2019-08-10 NOTE — Telephone Encounter (Signed)
Please have patient bring specimen to check for UTI.

## 2019-08-10 NOTE — Telephone Encounter (Signed)
Pt called and stated that his urine has a fishy odor and would like to bring a urine sample. Would this be okay? Please advise

## 2019-08-10 NOTE — Telephone Encounter (Signed)
Please advise 

## 2019-08-10 NOTE — Telephone Encounter (Signed)
Unable to leave message, no voicemail.

## 2019-08-12 DIAGNOSIS — N390 Urinary tract infection, site not specified: Secondary | ICD-10-CM | POA: Diagnosis not present

## 2019-08-12 LAB — POCT URINALYSIS DIPSTICK
Appearance: ABNORMAL
Bilirubin, UA: NEGATIVE
Blood, UA: NEGATIVE
Glucose, UA: NEGATIVE
Nitrite, UA: POSITIVE
Odor: ABNORMAL
Protein, UA: POSITIVE — AB
Spec Grav, UA: 1.01 (ref 1.010–1.025)
Urobilinogen, UA: 2 E.U./dL — AB
pH, UA: 8.5 — AB (ref 5.0–8.0)

## 2019-08-12 NOTE — Telephone Encounter (Signed)
Patient's mother drop urine off. UA done and urine culture ordered.

## 2019-08-14 ENCOUNTER — Other Ambulatory Visit: Payer: Self-pay | Admitting: Family Medicine

## 2019-08-14 ENCOUNTER — Telehealth: Payer: Self-pay

## 2019-08-14 DIAGNOSIS — G894 Chronic pain syndrome: Secondary | ICD-10-CM

## 2019-08-14 NOTE — Telephone Encounter (Signed)
Copied from [place] [id number]. Topic: General - Other >> Aug 14, 2019 10:27 AM Reyne Dumas L wrote: Reason for CRM:   Pt calling to state that he wants a DPR from sent to his home so that he can add his wife to get his medical information. Pt also states that he never wants his calls recorded due to HIPAA. Pt would also like a call from the nurse - he will not let me know what this is about.

## 2019-08-14 NOTE — Telephone Encounter (Signed)
Requested medication (s) are due for refill today: yes  Requested medication (s) are on the active medication list: yes  Last refill:  07/14/2019  Future visit scheduled: no  Notes to clinic:  Refill cannot be delegated    Requested Prescriptions  Pending Prescriptions Disp Refills   oxyCODONE (ROXICODONE) 15 MG immediate release tablet [Pharmacy Med Name: OXYCODONE HCL 15 MG TAB] 120 tablet     Sig: TAKE ONE TABLET EVERY 6 HOURS AS NEEDED FOR PAIN     Not Delegated - Analgesics:  Opioid Agonists Failed - 08/14/2019  9:22 AM      Failed - This refill cannot be delegated      Failed - Urine Drug Screen completed in last 360 days.      Passed - Valid encounter within last 6 months    Recent Outpatient Visits          2 weeks ago Tinea cruris   [facility] Chrismon, Jodell Cipro, [place]   1 month ago Tinea cruris   [facility], [place], [place]   1 month ago Recurrent UTI   [facility] Malva Limes, [place]   2 months ago Cystitis   [facility] Osvaldo Angst M, [place]   1 year ago Chronic UTI (urinary tract infection)   [facility] Malva Limes, MD              methadone (DOLOPHINE) 10 MG tablet [Pharmacy Med Name: METHADONE HCL 10 MG TAB] 240 tablet     Sig: TAKE TWO TABLETS EVERY 6 HOURS     Not Delegated - Analgesics:  Opioid Agonists Failed - 08/14/2019  9:22 AM      Failed - This refill cannot be delegated      Failed - Urine Drug Screen completed in last 360 days.      Passed - Valid encounter within last 6 months    Recent Outpatient Visits          2 weeks ago Tinea cruris   [facility] Chrismon, Jodell Cipro, [place]   1 month ago Tinea cruris   [facility], [place], [place]   1 month ago Recurrent UTI   [facility] Malva Limes, MD   2 months ago Cystitis   [facility] Osvaldo Angst M, [place]   1 year ago Chronic UTI  (urinary tract infection)   [facility] [name], Demetrios Isaacs, MD              alprazolam Prudy Feeler) 2 MG tablet [Pharmacy Med Name: ALPRAZOLAM 2 MG TAB] 120 tablet     Sig: TAKE ONE TABLET BY MOUTH EVERY 6 HOURS AS NEEDED     Not Delegated - Psychiatry:  Anxiolytics/Hypnotics Failed - 08/14/2019  9:22 AM      Failed - This refill cannot be delegated      Failed - Urine Drug Screen completed in last 360 days.      Passed - Valid encounter within last 6 months    Recent Outpatient Visits          2 weeks ago Tinea cruris   [facility] Chrismon, Jodell Cipro, [place]   1 month ago Tinea cruris   [facility], [place], [place]   1 month ago Recurrent UTI   [facility] Malva Limes, MD   2 months ago Cystitis   [facility] Osvaldo Angst M,  PA-C   1 year ago Chronic UTI (urinary tract infection)   [facility] Caryn Section, Kirstie Peri, MD

## 2019-08-14 NOTE — Telephone Encounter (Signed)
L.O.V> was on 07/30/2019, please advise.

## 2019-08-16 LAB — CULTURE, URINE COMPREHENSIVE

## 2019-08-17 ENCOUNTER — Other Ambulatory Visit: Payer: Self-pay | Admitting: Family Medicine

## 2019-08-17 ENCOUNTER — Telehealth: Payer: Self-pay

## 2019-08-17 DIAGNOSIS — N390 Urinary tract infection, site not specified: Secondary | ICD-10-CM

## 2019-08-17 MED ORDER — SULFAMETHOXAZOLE-TRIMETHOPRIM 800-160 MG PO TABS: 1.0000 | ORAL_TABLET | Freq: Two times a day (BID) | ORAL | 0 refills | Status: AC

## 2019-08-17 NOTE — Telephone Encounter (Signed)
Spoke with patient.  He has not been on any antibiotic for this one.  Please send in and he will do f/u culture 1 week after finishing

## 2019-08-17 NOTE — Telephone Encounter (Signed)
-----   Message from Margo Common, [place] sent at 08/17/2019  7:58 AM EST ----- Final report of urine culture confirms this antibiotic should clear the infection.

## 2019-08-17 NOTE — Telephone Encounter (Signed)
Sorry the urine result message may not have gotten to him on 08-14-19 which had the Septra-DS order in it. I sent it to the [facility] we have used for him in the past. Agree he should do a post treatment culture for cure.

## 2019-08-17 NOTE — Telephone Encounter (Signed)
I spoke with the patient and informed him that as for the DPR I thought we had to witness him sign the paper and that we could get him to do that the next time he came into the office.  He was ok with that.   As for the recorded calls I did not know how that could be handled and did not want to speculate so I would defer that to you.    Patient asked that you call him to let him know.  Thanks

## 2019-08-19 ENCOUNTER — Other Ambulatory Visit: Payer: Self-pay | Admitting: Family Medicine

## 2019-08-19 NOTE — Telephone Encounter (Signed)
Pharmacy requesting new prescription for QC NATURA-LAX 17 GM/SCOOP POW GM.

## 2019-08-21 DIAGNOSIS — R339 Retention of urine, unspecified: Secondary | ICD-10-CM | POA: Diagnosis not present

## 2019-08-25 ENCOUNTER — Other Ambulatory Visit: Payer: Self-pay | Admitting: Family Medicine

## 2019-08-25 MED ORDER — POLYETHYLENE GLYCOL 3350 17 GM/SCOOP PO POWD: ORAL | 3 refills | Status: DC

## 2019-08-25 NOTE — Addendum Note (Signed)
Addended by: Birdie Sons on: 08/25/2019 12:08 PM   Modules accepted: Orders

## 2019-09-08 ENCOUNTER — Other Ambulatory Visit: Payer: Self-pay | Admitting: Family Medicine

## 2019-09-08 ENCOUNTER — Other Ambulatory Visit: Payer: Self-pay

## 2019-09-08 DIAGNOSIS — N319 Neuromuscular dysfunction of bladder, unspecified: Secondary | ICD-10-CM

## 2019-09-08 MED ORDER — PROMETHAZINE HCL 25 MG PO TABS: ORAL_TABLET | ORAL | 0 refills | Status: DC

## 2019-09-08 MED ORDER — FLAVOXATE HCL 100 MG PO TABS: 100.0000 mg | ORAL_TABLET | Freq: Three times a day (TID) | ORAL | 1 refills | Status: DC | PRN

## 2019-09-08 NOTE — Telephone Encounter (Signed)
That's fine

## 2019-09-08 NOTE — Telephone Encounter (Signed)
Patient advised. He states he is experiencing dysuria and urinary urgency. He is requesting a refill on Flavoxate be sent to [facility].

## 2019-09-08 NOTE — Telephone Encounter (Signed)
rx refill promethazine (PHENERGAN) 25 MG tablet [facility] - [place], [place] Phone:  [phone]  Fax:  [phone]

## 2019-09-08 NOTE — Telephone Encounter (Signed)
Copied from [place] [id number]. Topic: General - Inquiry >> Sep 08, 2019  1:38 PM Mathis Bud wrote: Reason for CRM: Patient is requesting a call back, patient would like to bring a urine sample to see if he is cleared for his bladder infection Call back [phone]

## 2019-09-17 ENCOUNTER — Telehealth: Payer: Self-pay

## 2019-09-17 ENCOUNTER — Other Ambulatory Visit: Payer: Self-pay | Admitting: Family Medicine

## 2019-09-17 DIAGNOSIS — G894 Chronic pain syndrome: Secondary | ICD-10-CM

## 2019-09-17 MED ORDER — PHENAZOPYRIDINE HCL 200 MG PO TABS: 200.0000 mg | ORAL_TABLET | Freq: Three times a day (TID) | ORAL | 5 refills | Status: DC

## 2019-09-17 NOTE — Telephone Encounter (Signed)
Patient call was returned. Patient states that Justin [name] wanted him to drop off a urine sample to be send off for culture last week but he was not able to make it to the office. He would like to know if it is okay for him to jump it off on Monday , 09/21/2019 when office reopens.Patient also wants to know if the urine could be tested for STI. Please advise.

## 2019-09-17 NOTE — Telephone Encounter (Signed)
Requested Prescriptions  Pending Prescriptions Disp Refills  . phenazopyridine (PYRIDIUM) 200 MG tablet 10 tablet 5    Sig: Take 1 tablet (200 mg total) by mouth 3 (three) times daily.     Urology:  Bladder Analgesics Passed - 09/17/2019 11:31 AM      Passed - Valid encounter within last 6 months    Recent Outpatient Visits          1 month ago Tinea cruris   [place], [place]   2 months ago Tinea cruris   [place], [place]   2 months ago Recurrent UTI   [facility] Birdie Sons, MD   3 months ago [place] Carles Collet M, [place]   1 year ago Chronic UTI (urinary tract infection)   [facility] Birdie Sons, MD             Signed Prescriptions Disp Refills   oxyCODONE (ROXICODONE) 15 MG immediate release tablet 120 tablet 0    Sig: TAKE 1 TABLET BY MOUTH EVERY 6 HOUR AS NEEDED FOR PAIN     Not Delegated - Analgesics:  Opioid Agonists Failed - 09/17/2019 10:07 AM      Failed - This refill cannot be delegated      Failed - Urine Drug Screen completed in last 360 days.      Passed - Valid encounter within last 6 months    Recent Outpatient Visits          1 month ago Tinea cruris   [place], [place]   2 months ago Tinea cruris   [place], [place]   2 months ago Recurrent UTI   [facility] Birdie Sons, MD   3 months ago [place] Carles Collet M, PA-C   1 year ago Chronic UTI (urinary tract infection)   [facility] Birdie Sons, MD              methadone (DOLOPHINE) 10 MG tablet 240 tablet 0    Sig: TAKE TWO TABLETS EVERY SIX HOURS     Not Delegated - Analgesics:  Opioid Agonists Failed - 09/17/2019 10:07 AM      Failed - This refill cannot be delegated      Failed - Urine Drug Screen completed in  last 360 days.      Passed - Valid encounter within last 6 months    Recent Outpatient Visits          1 month ago Tinea cruris   [place], [place]   2 months ago Tinea cruris   [place], [place]   2 months ago Recurrent UTI   [facility] Birdie Sons, MD   3 months ago [place], [place], [place]   1 year ago Chronic UTI (urinary tract infection)   [facility] Birdie Sons, MD

## 2019-09-17 NOTE — Telephone Encounter (Signed)
Pt is requesting phenazopyridine (PYRIDIUM) 200 MG tablet  [facility], pt's wife will drop off sample. Pt has burning/discharge/smell when he urinates

## 2019-09-17 NOTE — Telephone Encounter (Signed)
Yes he can

## 2019-09-17 NOTE — Telephone Encounter (Signed)
Requested medication (s) are due for refill today: yes  Requested medication (s) are on the active medication list: yes  Last refill:  08/18/2019  Future visit scheduled: no  Notes to clinic:  This refill cannot be delegated    Requested Prescriptions  Pending Prescriptions Disp Refills   oxyCODONE (ROXICODONE) 15 MG immediate release tablet [Pharmacy Med Name: OXYCODONE HCL 15 MG TAB] 120 tablet     Sig: TAKE 1 TABLET BY MOUTH EVERY 6 HOUR AS NEEDED FOR PAIN      Not Delegated - Analgesics:  Opioid Agonists Failed - 09/17/2019  9:14 AM      Failed - This refill cannot be delegated      Failed - Urine Drug Screen completed in last 360 days.      Passed - Valid encounter within last 6 months    Recent Outpatient Visits           1 month ago Tinea cruris   [place], [place]   2 months ago Tinea cruris   [place], [place]   2 months ago Recurrent UTI   [facility] Birdie Sons, [place]   3 months ago [place] Carles Collet M, [place]   1 year ago Chronic UTI (urinary tract infection)   [facility] Birdie Sons, MD                methadone (DOLOPHINE) 10 MG tablet [Pharmacy Med Name: METHADONE HCL 10 MG TAB] 240 tablet     Sig: TAKE TWO TABLETS EVERY SIX HOURS      Not Delegated - Analgesics:  Opioid Agonists Failed - 09/17/2019  9:14 AM      Failed - This refill cannot be delegated      Failed - Urine Drug Screen completed in last 360 days.      Passed - Valid encounter within last 6 months    Recent Outpatient Visits           1 month ago Tinea cruris   [place], [place]   2 months ago Tinea cruris   [place], [place]   2 months ago Recurrent UTI   [facility] Birdie Sons, MD   3 months ago [place], [place], [place]    1 year ago Chronic UTI (urinary tract infection)   [facility] Birdie Sons, MD

## 2019-09-17 NOTE — Addendum Note (Signed)
Addended by: Jefferson Fuel on: 09/17/2019 11:31 AM   Modules accepted: Orders

## 2019-09-17 NOTE — Telephone Encounter (Signed)
Justin [name] patient L.O.V. was on 07/30/2019 and no upcoming appointment.Please advise.

## 2019-09-21 ENCOUNTER — Other Ambulatory Visit: Payer: Self-pay

## 2019-09-21 DIAGNOSIS — N390 Urinary tract infection, site not specified: Secondary | ICD-10-CM | POA: Diagnosis not present

## 2019-09-23 DIAGNOSIS — R339 Retention of urine, unspecified: Secondary | ICD-10-CM | POA: Diagnosis not present

## 2019-09-23 LAB — URINE CULTURE

## 2019-09-23 LAB — SPECIMEN STATUS REPORT

## 2019-09-24 ENCOUNTER — Telehealth: Payer: Self-pay

## 2019-09-24 NOTE — Telephone Encounter (Signed)
Pt advised.   Thanks,   -Kevron Patella  

## 2019-09-24 NOTE — Telephone Encounter (Signed)
-----   Message from Margaretann Loveless, PA-C sent at 09/23/2019  2:10 PM EST ----- His urine culture is negative. No bacterial growth.

## 2019-10-06 ENCOUNTER — Other Ambulatory Visit: Payer: Self-pay | Admitting: Family Medicine

## 2019-10-06 NOTE — Telephone Encounter (Signed)
Requested Prescriptions  Pending Prescriptions Disp Refills  . TOVIAZ 8 MG TB24 tablet [Pharmacy Med Name: TOVIAZ 8 MG TAB] 30 tablet 11    Sig: TAKE 1 TABLET BY MOUTH DAILY     Urology:  Bladder Agents Passed - 10/06/2019  2:00 PM      Passed - Valid encounter within last 12 months    Recent Outpatient Visits          2 months ago Tinea cruris   [facility] Chrismon, Jodell Cipro, [place]   2 months ago Tinea cruris   [facility], [place], [place]   3 months ago Recurrent UTI   [facility] Malva Limes, MD   4 months ago Cystitis   [facility] Osvaldo Angst M, [place]   1 year ago Chronic UTI (urinary tract infection)   [facility] Malva Limes, MD

## 2019-10-09 ENCOUNTER — Other Ambulatory Visit: Payer: Self-pay | Admitting: Family Medicine

## 2019-10-09 DIAGNOSIS — G894 Chronic pain syndrome: Secondary | ICD-10-CM

## 2019-10-09 NOTE — Telephone Encounter (Signed)
Requested medication (s) are due for refill today: no  Requested medication (s) are on the active medication list: yes  Last refill:  09/17/2019  Future visit scheduled: no  Notes to clinic:  This refill cannot be delegated   Requested Prescriptions  Pending Prescriptions Disp Refills   methadone (DOLOPHINE) 10 MG tablet [Pharmacy Med Name: METHADONE HCL 10 MG TAB] 240 tablet     Sig: TAKE TWO TABLETS EVERY SIX HOURS      Not Delegated - Analgesics:  Opioid Agonists Failed - 10/09/2019  2:54 PM      Failed - This refill cannot be delegated      Failed - Urine Drug Screen completed in last 360 days.      Passed - Valid encounter within last 6 months    Recent Outpatient Visits           2 months ago Tinea cruris   [facility] Chrismon, Jodell Cipro, [place]   2 months ago Tinea cruris   [facility], [place], [place]   3 months ago Recurrent UTI   [facility] Malva Limes, [place]   4 months ago Cystitis   [facility] Osvaldo Angst M, PA-C   1 year ago Chronic UTI (urinary tract infection)   [facility] Malva Limes, MD                oxyCODONE (ROXICODONE) 15 MG immediate release tablet [Pharmacy Med Name: OXYCODONE HCL 15 MG TAB] 120 tablet     Sig: TAKE 1 TABLET BY MOUTH EVERY 6 HOUR AS NEEDED FOR PAIN      Not Delegated - Analgesics:  Opioid Agonists Failed - 10/09/2019  2:54 PM      Failed - This refill cannot be delegated      Failed - Urine Drug Screen completed in last 360 days.      Passed - Valid encounter within last 6 months    Recent Outpatient Visits           2 months ago Tinea cruris   [facility] Chrismon, Jodell Cipro, [place]   2 months ago Tinea cruris   [facility], [place], [place]   3 months ago Recurrent UTI   [facility] Malva Limes, MD   4 months ago Cystitis   [facility] Osvaldo Angst M, [place]    1 year ago Chronic UTI (urinary tract infection)   [facility] Malva Limes, MD

## 2019-10-14 ENCOUNTER — Telehealth: Payer: Self-pay

## 2019-10-14 ENCOUNTER — Ambulatory Visit (INDEPENDENT_AMBULATORY_CARE_PROVIDER_SITE_OTHER): Payer: Medicare HMO | Admitting: Family Medicine

## 2019-10-14 ENCOUNTER — Encounter: Payer: Self-pay | Admitting: Family Medicine

## 2019-10-14 VITALS — Temp 101.2°F

## 2019-10-14 DIAGNOSIS — R509 Fever, unspecified: Secondary | ICD-10-CM

## 2019-10-14 DIAGNOSIS — Z20822 Contact with and (suspected) exposure to covid-19: Secondary | ICD-10-CM | POA: Diagnosis not present

## 2019-10-14 DIAGNOSIS — R1031 Right lower quadrant pain: Secondary | ICD-10-CM | POA: Diagnosis not present

## 2019-10-14 NOTE — Telephone Encounter (Signed)
Patient called back to inform Dr Beryle Flock that he was very sorry to have missed the call. Also he states that he will not not go to the ED due to fear of catching covid and getting pneumonia. He is asking for an appointment to get a CAT scan. States that from his perspective as a paralyzed person. Per patient he would prefer the CAT scan please call Ph# [phone]

## 2019-10-14 NOTE — Telephone Encounter (Signed)
LMTCB 10/14/2019  Thanks,   -Vernona Rieger

## 2019-10-14 NOTE — Telephone Encounter (Signed)
Please advise 

## 2019-10-14 NOTE — Progress Notes (Signed)
Patient: Justin [name] Male    DOB: 02-May-1981   39 y.o.   MRN: [id number] Visit Date: 10/14/2019  Today's Provider: Lavon Paganini, MD   Chief Complaint  Patient presents with  . Fever   Subjective:    I [place] S. Dimas, CMA, am acting as scribe for Lavon Paganini, MD.  Virtual Visit via Video Note  I connected with Justin [name] on 10/14/19 at 10:00 AM EST by a video enabled telemedicine application and verified that I am speaking with the correct person using two identifiers.  Location: Patient location: home Provider location: [facility] Persons involved in the visit: patient, provider   I discussed the limitations of evaluation and management by telemedicine and the availability of in person appointments. The patient expressed understanding and agreed to proceed.  Fever  This is a new problem. The current episode started yesterday. Associated symptoms include headaches, muscle aches and a sore throat. Pertinent negatives include no coughing, ear pain or wheezing. He has tried acetaminophen and NSAIDs for the symptoms. The treatment provided mild relief.   Fever started around 2am this morning, Tmax 101.91F.  Has autonomic dysfunction and has felt more spasms.   Has used wet cloth to bring down temperature. T currently 100F.  Also complaining of RLQ pain (points to McBurneys point).  Wonders if this is related to constipation. Had looser stools yesterday than usual.  No N/V, but states that he took some CBD oil today that may have helped with this.  His mother, who he lives with has McRae (he believes).  He has h/o recurrent UTIs.  He is unsure of wether he is symptomatic currently.  He states that he always has burning with urination.  He feels like his urine looks fairly clear.   Allergies  Allergen Reactions  . Azithromycin     throat closes  . Iodine   . Lorazepam     extremely hostile  . Meperidine   . Morphine  Sulfate     Psychotic symptoms     Current Outpatient Medications:  .  albuterol (PROVENTIL HFA;VENTOLIN HFA) 108 (90 Base) MCG/ACT inhaler, Inhale 2 puffs into the lungs every 6 (six) hours as needed for wheezing or shortness of breath., Disp: 1 Inhaler, Rfl: 0 .  alprazolam (XANAX) 2 MG tablet, TAKE ONE TABLET BY MOUTH EVERY 6 HOURS AS NEEDED, Disp: 120 tablet, Rfl: 3 .  AMITIZA 24 MCG capsule, TAKE 1 CAPSULE BY MOUTH TWICE DAILY, Disp: 60 capsule, Rfl: 11 .  amphetamine-dextroamphetamine (ADDERALL) 15 MG tablet, TAKE ONE TABLET TWICE DAILY, Disp: 60 tablet, Rfl: 0 .  baclofen (LIORESAL) 20 MG tablet, TAKE ONE TABLET BY MOUTH FOUR TIMES DAILY, Disp: 120 each, Rfl: 12 .  cephALEXin (KEFLEX) 250 MG capsule, Take 1 capsule (250 mg total) by mouth 4 (four) times daily., Disp: 28 capsule, Rfl: 0 .  clotrimazole-betamethasone (LOTRISONE) cream, Apply 1 application topically 2 (two) times daily., Disp: 30 g, Rfl: 4 .  dantrolene (DANTRIUM) 25 MG capsule, Take 1 capsule by mouth daily. Reported on 10/26/2015, Disp: , Rfl:  .  diphenhydrAMINE (BENADRYL) 25 MG tablet, Take 1 tablet by mouth as needed., Disp: , Rfl:  .  docusate sodium (COLACE) 250 MG capsule, Take 1 capsule (250 mg total) by mouth 4 (four) times daily as needed., Disp: 120 capsule, Rfl: 3 .  levothyroxine (SYNTHROID) 75 MCG tablet, TAKE ONE TABLET ON AN EMPTY STOMACH WITHA GLASS OF WATER AT LEAST 30 TO 60  MINUTES BEFORE BREAKFAST, Disp: 30 tablet, Rfl: 11 .  lidocaine (LMX) 4 % cream, Apply 1 application topically 3 (three) times daily., Disp: 133 g, Rfl: 5 .  methadone (DOLOPHINE) 10 MG tablet, TAKE TWO TABLETS EVERY SIX HOURS, Disp: 240 tablet, Rfl: 0 .  Mineral Oil OIL, daily., Disp: , Rfl:  .  naproxen (NAPROSYN) 500 MG tablet, TAKE ONE TABLET TWICE DAILY, Disp: 60 tablet, Rfl: 5 .  omeprazole (PRILOSEC) 20 MG capsule, TAKE 1 CAPSULE EVERY DAY, Disp: 30 capsule, Rfl: 12 .  oxyCODONE (ROXICODONE) 15 MG immediate release tablet, TAKE 1  TABLET BY MOUTH EVERY 6 HOUR AS NEEDED FOR PAIN, Disp: 120 tablet, Rfl: 0 .  PARoxetine (PAXIL) 30 MG tablet, TAKE 2 TABLETS BY MOUTH DAILY, Disp: 60 tablet, Rfl: 12 .  phenazopyridine (PYRIDIUM) 200 MG tablet, Take 1 tablet (200 mg total) by mouth 3 (three) times daily., Disp: 10 tablet, Rfl: 5 .  polyethylene glycol powder (QC NATURA-LAX) 17 GM/SCOOP powder, MIX 1 CAPFUL (17GM) IN 8 OUNCES OF WATEROR JUICE AND DRINK ONCE A DAY, Disp: 510 g, Rfl: 3 .  promethazine (PHENERGAN) 25 MG suppository, Place 1 suppository (25 mg total) rectally every 6 (six) hours as needed for nausea or vomiting., Disp: 12 each, Rfl: 1 .  promethazine (PHENERGAN) 25 MG tablet, TAKE ONE TABLET BY MOUTH EVERY 4 HOURS AS NEEDED FOR NAUSEA/ VOMITING, Disp: 30 tablet, Rfl: 0 .  silver sulfADIAZINE (SILVADENE) 1 % cream, Apply 1 application topically 2 (two) times daily., Disp: 50 g, Rfl: 0 .  tiZANidine (ZANAFLEX) 4 MG tablet, TAKE TWO TABLETS FOUR TIMES DAILY, Disp: 240 tablet, Rfl: 3 .  TOVIAZ 8 MG TB24 tablet, TAKE 1 TABLET BY MOUTH DAILY, Disp: 30 tablet, Rfl: 11 .  VESICARE 10 MG tablet, TAKE 1 TABLET BY MOUTH DAILY, Disp: 30 tablet, Rfl: 11 .  dronabinol (MARINOL) 10 MG capsule, Take 1 capsule (10 mg total) by mouth every 6 (six) hours as needed. (Patient not taking: Reported on 10/14/2019), Disp: 30 capsule, Rfl: 1 .  flavoxATE (URISPAS) 100 MG tablet, Take 1 tablet (100 mg total) by mouth 3 (three) times daily as needed for bladder spasms. (Patient not taking: Reported on 10/14/2019), Disp: 15 tablet, Rfl: 1 .  fluticasone (FLONASE) 50 MCG/ACT nasal spray, Place 2 sprays into both nostrils daily. (Patient not taking: Reported on 10/14/2019), Disp: 16 g, Rfl: 6 .  gabapentin (NEURONTIN) 800 MG tablet, Take 1 tablet (800 mg total) by mouth 4 (four) times daily. (Patient not taking: Reported on 10/14/2019), Disp: 120 tablet, Rfl: 5 .  NIFEdipine (PROCARDIA) 20 MG capsule, One tablet three times a day as needed (Patient not  taking: Reported on 07/14/2019), Disp: 60 capsule, Rfl: 1 .  sildenafil (REVATIO) 20 MG tablet, Take 3 to 5 tablets two hours before intercouse on an empty stomach.  Do not take with nitrates. (Patient not taking: Reported on 07/14/2019), Disp: 50 tablet, Rfl: 3  Current Facility-Administered Medications:  .  methylPREDNISolone acetate (DEPO-MEDROL) injection 40 mg, 40 mg, Intramuscular, Once, Fisher, Demetrios Isaacs, MD  Review of Systems  Constitutional: Positive for fever.  HENT: Positive for sore throat. Negative for ear pain.   Respiratory: Negative for cough, shortness of breath and wheezing.   Musculoskeletal: Positive for myalgias.  Neurological: Positive for headaches.    Social History   Tobacco Use  . Smoking status: Former Games developer  . Smokeless tobacco: Never Used  . Tobacco comment: Using Nicorette Gum  Substance Use Topics  .  Alcohol use: Yes    Alcohol/week: 0.0 standard drinks    Comment: occasionaly-rare      Objective:   Temp (!) 101.2 F (38.4 C) (Oral)  Vitals:   10/14/19 0952  Temp: (!) 101.2 F (38.4 C)  TempSrc: Oral  There is no height or weight on file to calculate BMI.   Physical Exam Vitals reviewed.  Constitutional:      General: He is not in acute distress.    Comments: Appears ill, but nontoxic  HENT:     Head: Normocephalic and atraumatic.  Pulmonary:     Effort: Pulmonary effort is normal. No respiratory distress.  Neurological:     Mental Status: He is alert and oriented to person, place, and time.      No results found for any visits on 10/14/19.     Assessment & Plan     I discussed the assessment and treatment plan with the patient. The patient was provided an opportunity to ask questions and all were answered. The patient agreed with the plan and demonstrated an understanding of the instructions.   The patient was advised to call back or seek an in-person evaluation if the symptoms worsen or if the condition fails to improve as  anticipated.  I provided >30 minutes of non-face-to-face time during this encounter.  1. Fever, unspecified fever cause 2. Exposure to COVID-19 virus 3. RLQ abdominal pain -New problem x1 day -Differential diagnosis is quite broad -Patient denies any open wounds or evidence of cellulitis -Has a history of recurrent UTIs, so his abdominal pain and fever could be related to UTI -He also could have Covid given that he has had an exposure to someone presumed to have COVID-19 who lives in his house -Given his right lower quadrant pain (which came on suddenly and is point tender at McBurney's point), also concern for appendicitis -His paraplegia complicates this picture as well -Advised patient that he needs several diagnostic studies to figure out the cause of his fever, including CT abdomen pelvis, Covid testing, UA, and lab studies -He cannot come into our office for these tests at this time, given Covid precautions -He needs to be evaluated in person -Advised him to proceed to the emergency department -She is very hesitant to go to the emergency department due to fear of catching Covid there   The entirety of the information documented in the History of Present Illness, Review of Systems and Physical Exam were personally obtained by me. Portions of this information were initially documented by Rondel Baton, CMA and reviewed by me for thoroughness and accuracy.    Maurisa Tesmer, Marzella Schlein, MD MPH [facility]

## 2019-10-14 NOTE — Telephone Encounter (Signed)
Please reiterate the need for in person evaluation and possibly for advanced imaging.  If he refuses to go to ED, that is his choice and risk

## 2019-10-14 NOTE — Telephone Encounter (Signed)
Copied from CRM [id number]. Topic: General - Other >> Oct 14, 2019 11:14 AM Marylen Ponto wrote: Reason for CRM: Pt stated he was told to go over to the ER for a CAT scan but he does not want to do that. Pt stated he would rather come in to the office to get the CAT scan. Pt requests call back.

## 2019-10-14 NOTE — Telephone Encounter (Signed)
Spoke to patient and strongly recommended patient to go to the ER. For CTscan, labs and urine test. Patient refuses to go to the ER.

## 2019-10-14 NOTE — Telephone Encounter (Signed)
As we discussed during his virtual visit earlier today, he needs to be seen in person and evaluated for his abdominal pain and fever.  He needs several diagnostic studies to work-up the cause of his fever, including abdominal imaging, Covid testing, lab work, and UA.  We are unable to bring him into our office currently given Covid precautions.  He needs to go to the emergency department.

## 2019-10-14 NOTE — Telephone Encounter (Signed)
Left detailed message with Dr. Penelope Coop directions to go to the ER.

## 2019-10-14 NOTE — Telephone Encounter (Signed)
Patient advised again to go to the ER for in person evaluation. Patient refuses to go to the ER, patient reports that his symptoms have improved. Patient denies any fever, no morse spasms. Patient advised to go to the ER again.

## 2019-10-17 ENCOUNTER — Emergency Department: Payer: Medicare HMO

## 2019-10-17 ENCOUNTER — Other Ambulatory Visit: Payer: Self-pay

## 2019-10-17 DIAGNOSIS — G8220 Paraplegia, unspecified: Secondary | ICD-10-CM | POA: Insufficient documentation

## 2019-10-17 DIAGNOSIS — U071 COVID-19: Secondary | ICD-10-CM | POA: Insufficient documentation

## 2019-10-17 DIAGNOSIS — M545 Low back pain: Secondary | ICD-10-CM | POA: Diagnosis not present

## 2019-10-17 DIAGNOSIS — J1282 Pneumonia due to coronavirus disease 2019: Secondary | ICD-10-CM | POA: Diagnosis not present

## 2019-10-17 DIAGNOSIS — R109 Unspecified abdominal pain: Secondary | ICD-10-CM | POA: Diagnosis not present

## 2019-10-17 DIAGNOSIS — Z87891 Personal history of nicotine dependence: Secondary | ICD-10-CM | POA: Diagnosis not present

## 2019-10-17 DIAGNOSIS — G894 Chronic pain syndrome: Secondary | ICD-10-CM | POA: Insufficient documentation

## 2019-10-17 DIAGNOSIS — Z79899 Other long term (current) drug therapy: Secondary | ICD-10-CM | POA: Insufficient documentation

## 2019-10-17 DIAGNOSIS — N3000 Acute cystitis without hematuria: Secondary | ICD-10-CM | POA: Diagnosis not present

## 2019-10-17 DIAGNOSIS — J1289 Other viral pneumonia: Secondary | ICD-10-CM | POA: Diagnosis not present

## 2019-10-17 DIAGNOSIS — R05 Cough: Secondary | ICD-10-CM | POA: Diagnosis not present

## 2019-10-17 DIAGNOSIS — J45909 Unspecified asthma, uncomplicated: Secondary | ICD-10-CM | POA: Insufficient documentation

## 2019-10-17 DIAGNOSIS — N319 Neuromuscular dysfunction of bladder, unspecified: Secondary | ICD-10-CM | POA: Insufficient documentation

## 2019-10-17 DIAGNOSIS — M5489 Other dorsalgia: Secondary | ICD-10-CM | POA: Diagnosis not present

## 2019-10-17 DIAGNOSIS — S14109A Unspecified injury at unspecified level of cervical spinal cord, initial encounter: Secondary | ICD-10-CM | POA: Diagnosis not present

## 2019-10-17 DIAGNOSIS — E039 Hypothyroidism, unspecified: Secondary | ICD-10-CM | POA: Diagnosis not present

## 2019-10-17 DIAGNOSIS — R0602 Shortness of breath: Secondary | ICD-10-CM | POA: Diagnosis not present

## 2019-10-17 DIAGNOSIS — R030 Elevated blood-pressure reading, without diagnosis of hypertension: Secondary | ICD-10-CM | POA: Diagnosis not present

## 2019-10-17 NOTE — ED Triage Notes (Signed)
First Nurse: patient brought in by ems from home. Patient with complaint muscle spasms and cough. Patient has chronic back pain and is a paraplegic from spinal injury. Patient lives with is mother that tested positive for covid last week.  Ems vs temp 99.6, bp 139/70 and oxygen saturation 95%.

## 2019-10-17 NOTE — ED Triage Notes (Signed)
Patient to ED via EMS for cough.  Reports mom who he lives with has tested COVID +.  Patient falling asleep during triage.

## 2019-10-18 ENCOUNTER — Encounter: Payer: Self-pay | Admitting: Radiology

## 2019-10-18 ENCOUNTER — Other Ambulatory Visit: Payer: Self-pay

## 2019-10-18 ENCOUNTER — Emergency Department: Payer: Medicare HMO

## 2019-10-18 ENCOUNTER — Emergency Department
Admission: EM | Admit: 2019-10-18 | Discharge: 2019-10-18 | Disposition: A | Discharge status: Home / Self Care | Payer: Medicare HMO | Attending: Emergency Medicine | Admitting: Emergency Medicine

## 2019-10-18 DIAGNOSIS — Z743 Need for continuous supervision: Secondary | ICD-10-CM | POA: Diagnosis not present

## 2019-10-18 DIAGNOSIS — G8929 Other chronic pain: Secondary | ICD-10-CM

## 2019-10-18 DIAGNOSIS — U071 COVID-19: Secondary | ICD-10-CM

## 2019-10-18 DIAGNOSIS — N3000 Acute cystitis without hematuria: Secondary | ICD-10-CM | POA: Diagnosis not present

## 2019-10-18 DIAGNOSIS — R0602 Shortness of breath: Secondary | ICD-10-CM | POA: Diagnosis not present

## 2019-10-18 DIAGNOSIS — S24109S Unspecified injury at unspecified level of thoracic spinal cord, sequela: Secondary | ICD-10-CM

## 2019-10-18 DIAGNOSIS — R05 Cough: Secondary | ICD-10-CM | POA: Diagnosis not present

## 2019-10-18 DIAGNOSIS — R279 Unspecified lack of coordination: Secondary | ICD-10-CM | POA: Diagnosis not present

## 2019-10-18 DIAGNOSIS — K769 Liver disease, unspecified: Secondary | ICD-10-CM

## 2019-10-18 DIAGNOSIS — R109 Unspecified abdominal pain: Secondary | ICD-10-CM | POA: Diagnosis not present

## 2019-10-18 LAB — CBC WITH DIFFERENTIAL/PLATELET
Abs Immature Granulocytes: 0.01 10*3/uL (ref 0.00–0.07)
Basophils Absolute: 0 10*3/uL (ref 0.0–0.1)
Basophils Relative: 0 %
Eosinophils Absolute: 0 10*3/uL (ref 0.0–0.5)
Eosinophils Relative: 1 %
HCT: 38.1 % — ABNORMAL LOW (ref 39.0–52.0)
Hemoglobin: 12.2 g/dL — ABNORMAL LOW (ref 13.0–17.0)
Immature Granulocytes: 0 %
Lymphocytes Relative: 18 %
Lymphs Abs: 0.5 10*3/uL — ABNORMAL LOW (ref 0.7–4.0)
MCH: 27 pg (ref 26.0–34.0)
MCHC: 32 g/dL (ref 30.0–36.0)
MCV: 84.3 fL (ref 80.0–100.0)
Monocytes Absolute: 0.3 10*3/uL (ref 0.1–1.0)
Monocytes Relative: 10 %
Neutro Abs: 2.1 10*3/uL (ref 1.7–7.7)
Neutrophils Relative %: 71 %
Platelets: 97 10*3/uL — ABNORMAL LOW (ref 150–400)
RBC: 4.52 MIL/uL (ref 4.22–5.81)
RDW: 13.8 % (ref 11.5–15.5)
WBC: 2.9 10*3/uL — ABNORMAL LOW (ref 4.0–10.5)
nRBC: 0 % (ref 0.0–0.2)

## 2019-10-18 LAB — COMPREHENSIVE METABOLIC PANEL
ALT: 32 U/L (ref 0–44)
AST: 31 U/L (ref 15–41)
Albumin: 4.2 g/dL (ref 3.5–5.0)
Alkaline Phosphatase: 56 U/L (ref 38–126)
Anion gap: 12 (ref 5–15)
BUN: 19 mg/dL (ref 6–20)
CO2: 25 mmol/L (ref 22–32)
Calcium: 8.8 mg/dL — ABNORMAL LOW (ref 8.9–10.3)
Chloride: 102 mmol/L (ref 98–111)
Creatinine, Ser: 0.68 mg/dL (ref 0.61–1.24)
GFR calc Af Amer: >60 mL/min (ref >60)
GFR calc non Af Amer: >60 mL/min (ref >60)
Glucose, Bld: 80 mg/dL (ref 70–99)
Potassium: 3.7 mmol/L (ref 3.5–5.1)
Sodium: 139 mmol/L (ref 135–145)
Total Bilirubin: 0.9 mg/dL (ref 0.3–1.2)
Total Protein: 7.2 g/dL (ref 6.5–8.1)

## 2019-10-18 LAB — URINALYSIS, COMPLETE (UACMP) WITH MICROSCOPIC
Bilirubin Urine: NEGATIVE
Glucose, UA: NEGATIVE mg/dL
Hgb urine dipstick: NEGATIVE
Ketones, ur: 20 mg/dL — AB
Nitrite: POSITIVE — AB
Protein, ur: 30 mg/dL — AB
Specific Gravity, Urine: 1.027 (ref 1.005–1.030)
pH: 5 (ref 5.0–8.0)

## 2019-10-18 LAB — PROCALCITONIN: Procalcitonin: <0.1 ng/mL

## 2019-10-18 LAB — POC SARS CORONAVIRUS 2 AG: SARS Coronavirus 2 Ag: POSITIVE — AB

## 2019-10-18 LAB — LIPASE, BLOOD: Lipase: 18 U/L (ref 11–51)

## 2019-10-18 LAB — LACTIC ACID, PLASMA: Lactic Acid, Venous: 2.1 mmol/L (ref 0.5–1.9)

## 2019-10-18 MED ORDER — IBUPROFEN 800 MG PO TABS
800.0000 mg | ORAL_TABLET | Freq: Once | ORAL | Status: AC
  Administered 2019-10-18: 800 mg via ORAL
  Filled 2019-10-18: qty 1

## 2019-10-18 MED ORDER — CEPHALEXIN 500 MG PO CAPS: 500.0000 mg | ORAL_CAPSULE | Freq: Two times a day (BID) | ORAL | 0 refills | Status: AC

## 2019-10-18 MED ORDER — HYDROMORPHONE HCL 1 MG/ML IJ SOLN
1.0000 mg | INTRAMUSCULAR | Status: AC
  Administered 2019-10-18: 1 mg via INTRAVENOUS
  Filled 2019-10-18: qty 1

## 2019-10-18 MED ORDER — LIDOCAINE HCL URETHRAL/MUCOSAL 2 % EX GEL
1.0000 "application " | Freq: Once | CUTANEOUS | Status: AC
  Administered 2019-10-18: 1 via TOPICAL
  Filled 2019-10-18: qty 10

## 2019-10-18 MED ORDER — SODIUM CHLORIDE 0.9 % IV SOLN
1.0000 g | Freq: Once | INTRAVENOUS | Status: AC
  Administered 2019-10-18: 1 g via INTRAVENOUS

## 2019-10-18 MED ORDER — HYDROMORPHONE HCL 1 MG/ML IJ SOLN
INTRAMUSCULAR | Status: AC
  Filled 2019-10-18: qty 1

## 2019-10-18 MED ORDER — PROMETHAZINE HCL 25 MG/ML IJ SOLN
12.5000 mg | Freq: Four times a day (QID) | INTRAMUSCULAR | Status: DC | PRN
  Administered 2019-10-18: 12.5 mg via INTRAMUSCULAR
  Filled 2019-10-18: qty 1

## 2019-10-18 MED ORDER — HYDROMORPHONE HCL 1 MG/ML IJ SOLN
1.0000 mg | Freq: Once | INTRAMUSCULAR | Status: AC
  Administered 2019-10-18: 1 mg via INTRAMUSCULAR

## 2019-10-18 MED ORDER — IOHEXOL 350 MG/ML SOLN
100.0000 mL | Freq: Once | INTRAVENOUS | Status: AC | PRN
  Administered 2019-10-18: 100 mL via INTRAVENOUS

## 2019-10-18 MED ORDER — PROMETHAZINE HCL 25 MG/ML IJ SOLN
12.5000 mg | Freq: Once | INTRAMUSCULAR | Status: AC
  Administered 2019-10-18: 12.5 mg via INTRAVENOUS
  Filled 2019-10-18: qty 1

## 2019-10-18 MED ORDER — OXYCODONE HCL 5 MG PO TABS
15.0000 mg | ORAL_TABLET | Freq: Once | ORAL | Status: AC
  Administered 2019-10-18: 15 mg via ORAL
  Filled 2019-10-18: qty 3

## 2019-10-18 MED ORDER — BACLOFEN 10 MG PO TABS
20.0000 mg | ORAL_TABLET | Freq: Once | ORAL | Status: AC
  Administered 2019-10-18: 20 mg via ORAL
  Filled 2019-10-18: qty 2

## 2019-10-18 MED ORDER — HYDROMORPHONE HCL 1 MG/ML IJ SOLN
1.0000 mg | Freq: Once | INTRAMUSCULAR | Status: AC
  Administered 2019-10-18: 1 mg via INTRAVENOUS

## 2019-10-18 MED ORDER — HYDROMORPHONE HCL 1 MG/ML IJ SOLN
1.0000 mg | Freq: Once | INTRAMUSCULAR | Status: DC
  Filled 2019-10-18: qty 1

## 2019-10-18 NOTE — ED Notes (Signed)
Report to hunter, rn.  

## 2019-10-18 NOTE — ED Notes (Signed)
Pt to ct scan.

## 2019-10-18 NOTE — ED Notes (Signed)
md at bedside

## 2019-10-18 NOTE — ED Notes (Signed)
Pending EMS transport. Pt c/o severe muscle spasms.

## 2019-10-18 NOTE — ED Notes (Signed)
Dr. Larinda Buttery notified of critical lactic 2.1. no new orders received.

## 2019-10-18 NOTE — ED Notes (Signed)
Pt given PO fluids per pt request. Calm and cooperative at this time. A&Ox4. Pt c/o muscle spasms in upper and lower extremities.

## 2019-10-18 NOTE — ED Notes (Signed)
Pt will need EMS transport home per pt. Quale MD ok with plan. [place] call [facility] to transport.

## 2019-10-18 NOTE — ED Notes (Signed)
Dr. Larinda Buttery in to attempt ultrasound guided iv insertion.

## 2019-10-18 NOTE — ED Notes (Signed)
Per dr. Larinda Buttery pt does not want to leave a this time.

## 2019-10-18 NOTE — ED Notes (Signed)
Per melody, ed tech who answered pt's call bell, pt would like to speak with md and leave. Dr. Larinda Buttery in to speak with pt.

## 2019-10-18 NOTE — ED Notes (Signed)
Pt returned from ct. Per ct tech not able to complete scan due to pt behavior. md notified by ct tech.

## 2019-10-18 NOTE — ED Provider Notes (Signed)
[facility] Emergency Department Provider Note   ____________________________________________   First MD Initiated Contact with Patient 10/18/19 0403     (approximate)  I have reviewed the triage vital signs and the nursing notes.   HISTORY  Chief Complaint Cough    HPI Justin [name] is a 39 y.o. male with past medical history of spinal cord injury and chronic pain syndrome, neurogenic bladder, asthma, and anxiety who presents to the ED for cough and pain.  Patient reports that over the past couple of days he has had increasing pain in his low back and shooting into both legs, and that "whenever this happens something is going on with me".  He has had an increasing cough over the past couple of days and began coughing up small amounts of blood earlier today.  He states that his mother, whom he lives with, recently tested positive for COVID-19 and has been admitted to [facility].  He has been having occasional fevers, states it reached as high as 103 yesterday and he has been taking regularly scheduled Tylenol.  He has had occasional pain in his lower abdomen, but denies any dysuria, hematuria, or flank pain.  He typically self caths and does not always have urinary symptoms when he has a UTI.        Past Medical History:  Diagnosis Date  . Arthritis   . Asthma   . Chronic anxiety   . Chronic depression   . Chronic pain syndrome    secondary to fracture T9 and L3 with extensive spinal surgeery  . Postoperative wound infection of right hip 2012  . Rheumatic fever     Patient Active Problem List   Diagnosis Date Noted  . Spinal cord injury, thoracic region (HCC) 11/15/2017  . Chronic, continuous use of opioids 04/27/2016  . Depression 11/30/2015  . Chronic hoarseness 10/26/2015  . Hypothyroidism 10/26/2015  . Elevated transaminase level 08/10/2015  . Spasticity 07/30/2015  . Paraplegia (HCC) 07/21/2015  . Bladder neurogenesis  04/28/2015  . Infection due to drug-resistant organism 03/22/2015  . Chronic UTI 03/11/2015  . Chronic pain associated with significant psychosocial dysfunction 02/18/2015  . ADD (attention deficit disorder) 02/17/2015  . Anxiety 02/17/2015  . Chronic constipation 02/17/2015  . Bed sore on buttock 02/17/2015  . Abscess, dental 02/17/2015  . Dermatitis, eczematoid 02/17/2015  . Dizziness 02/17/2015  . Esophageal reflux 02/17/2015  . Hypesthesia 02/17/2015  . Decubitus ulcer 02/17/2015  . Spasm 02/17/2015  . Concussion and swelling of spinal cord 02/17/2015  . Body tinea 02/17/2015  . GERD (gastroesophageal reflux disease) 02/17/2015    Past Surgical History:  Procedure Laterality Date  . BACK SURGERY  2000   Fracture T9 and L3 with paraplegia    Prior to Admission medications   Medication Sig Start Date End Date Taking? Authorizing Provider  albuterol (PROVENTIL HFA;VENTOLIN HFA) 108 (90 Base) MCG/ACT inhaler Inhale 2 puffs into the lungs every 6 (six) hours as needed for wheezing or shortness of breath. 10/22/17   Malva Limes, MD  alprazolam Prudy Feeler) 2 MG tablet TAKE ONE TABLET BY MOUTH EVERY 6 HOURS AS NEEDED 08/18/19   Malva Limes, MD  AMITIZA 24 MCG capsule TAKE 1 CAPSULE BY MOUTH TWICE DAILY 05/07/19   Malva Limes, MD  amphetamine-dextroamphetamine (ADDERALL) 15 MG tablet TAKE ONE TABLET TWICE DAILY 05/10/19   Malva Limes, MD  baclofen (LIORESAL) 20 MG tablet TAKE ONE TABLET BY MOUTH FOUR TIMES DAILY 05/10/19  Malva Limes, MD  cephALEXin (KEFLEX) 500 MG capsule Take 1 capsule (500 mg total) by mouth 2 (two) times daily for 7 days. 10/18/19 10/25/19  Chesley Noon, MD  clotrimazole-betamethasone (LOTRISONE) cream Apply 1 application topically 2 (two) times daily. 07/14/19   Chrismon, Jodell Cipro, PA  dantrolene (DANTRIUM) 25 MG capsule Take 1 capsule by mouth daily. Reported on 10/26/2015    [provider]  diphenhydrAMINE (BENADRYL) 25 MG tablet Take 1  tablet by mouth as needed.    [provider]  docusate sodium (COLACE) 250 MG capsule Take 1 capsule (250 mg total) by mouth 4 (four) times daily as needed. 08/10/15   Malva Limes, MD  dronabinol (MARINOL) 10 MG capsule Take 1 capsule (10 mg total) by mouth every 6 (six) hours as needed. Patient not taking: Reported on 10/14/2019 04/18/16   Malva Limes, MD  flavoxATE (URISPAS) 100 MG tablet Take 1 tablet (100 mg total) by mouth 3 (three) times daily as needed for bladder spasms. Patient not taking: Reported on 10/14/2019 09/08/19   Malva Limes, MD  fluticasone [facility]) 50 MCG/ACT nasal spray Place 2 sprays into both nostrils daily. Patient not taking: Reported on 10/14/2019 10/22/17   Malva Limes, MD  gabapentin (NEURONTIN) 800 MG tablet Take 1 tablet (800 mg total) by mouth 4 (four) times daily. Patient not taking: Reported on 10/14/2019 11/30/15   Malva Limes, MD  levothyroxine (SYNTHROID) 75 MCG tablet TAKE ONE TABLET ON AN EMPTY STOMACH WITHA GLASS OF WATER AT LEAST 30 TO 60 MINUTES BEFORE BREAKFAST 06/07/19   Malva Limes, MD  lidocaine (LMX) 4 % cream Apply 1 application topically 3 (three) times daily. 11/30/15   Malva Limes, MD  methadone (DOLOPHINE) 10 MG tablet TAKE TWO TABLETS EVERY SIX HOURS 10/12/19   Malva Limes, MD  Mineral Oil OIL daily.    [provider]  naproxen (NAPROSYN) 500 MG tablet TAKE ONE TABLET TWICE DAILY 04/16/18   Malva Limes, MD  NIFEdipine (PROCARDIA) 20 MG capsule One tablet three times a day as needed Patient not taking: Reported on 07/14/2019 07/31/16   Malva Limes, MD  omeprazole (PRILOSEC) 20 MG capsule TAKE 1 CAPSULE EVERY DAY 02/05/19   Malva Limes, MD  oxyCODONE (ROXICODONE) 15 MG immediate release tablet TAKE 1 TABLET BY MOUTH EVERY 6 HOUR AS NEEDED FOR PAIN 10/12/19   Malva Limes, MD  PARoxetine (PAXIL) 30 MG tablet TAKE 2 TABLETS BY MOUTH DAILY 03/08/19   Malva Limes, MD    phenazopyridine (PYRIDIUM) 200 MG tablet Take 1 tablet (200 mg total) by mouth 3 (three) times daily. 09/17/19   Chrismon, Jodell Cipro, PA  polyethylene glycol powder (QC NATURA-LAX) 17 GM/SCOOP powder MIX 1 CAPFUL (17GM) IN 8 OUNCES OF WATEROR JUICE AND DRINK ONCE A DAY 08/25/19   Malva Limes, MD  promethazine (PHENERGAN) 25 MG suppository Place 1 suppository (25 mg total) rectally every 6 (six) hours as needed for nausea or vomiting. 02/18/19   Malva Limes, MD  promethazine (PHENERGAN) 25 MG tablet TAKE ONE TABLET BY MOUTH EVERY 4 HOURS AS NEEDED FOR NAUSEA/ VOMITING 09/08/19   Chrismon, Jodell Cipro, PA  sildenafil (REVATIO) 20 MG tablet Take 3 to 5 tablets two hours before intercouse on an empty stomach.  Do not take with nitrates. Patient not taking: Reported on 07/14/2019 08/12/17   Michiel Cowboy A, PA-C  silver sulfADIAZINE (SILVADENE) 1 % cream Apply 1 application  topically 2 (two) times daily. 07/14/19   Chrismon, Jodell Cipro, PA  tiZANidine (ZANAFLEX) 4 MG tablet TAKE TWO TABLETS FOUR TIMES DAILY 05/17/18   Malva Limes, MD  TOVIAZ 8 MG TB24 tablet TAKE 1 TABLET BY MOUTH DAILY 10/06/19   Malva Limes, MD  VESICARE 10 MG tablet TAKE 1 TABLET BY MOUTH DAILY 10/13/18   Malva Limes, MD    Allergies Azithromycin, Iodine, Lorazepam, Meperidine, and Morphine sulfate  Family History  Problem Relation Age of Onset  . Hypertension Father   . Gout Father   . Prostate cancer Neg Hx   . Kidney cancer Neg Hx     Social History Social History   Tobacco Use  . Smoking status: Former Games developer  . Smokeless tobacco: Never Used  . Tobacco comment: Using Nicorette Gum  Substance Use Topics  . Alcohol use: Yes    Alcohol/week: 0.0 standard drinks    Comment: occasionaly-rare  . Drug use: No    Review of Systems  Constitutional: Positive for fever/chills Eyes: No visual changes. ENT: No sore throat. Cardiovascular: Denies chest pain. Respiratory: Denies shortness of breath.   Positive for cough and hemoptysis. Gastrointestinal: Positive for abdominal pain.  No nausea, no vomiting.  No diarrhea.  No constipation. Genitourinary: Negative for dysuria. Musculoskeletal: Positive for back pain. Skin: Negative for rash. Neurological: Negative for headaches, focal weakness or numbness.  ____________________________________________   PHYSICAL EXAM:  VITAL SIGNS: ED Triage Vitals  Enc Vitals Group     BP 10/17/19 2007 122/63     Pulse Rate 10/17/19 2007 73     Resp 10/17/19 2007 18     Temp 10/17/19 2007 99.8 F (37.7 C)     Temp Source 10/17/19 2007 Oral     SpO2 10/17/19 2007 94 %     Weight 10/17/19 2008 160 lb (72.6 kg)     Height 10/17/19 2008 6\' 4"  (1.93 m)     Head Circumference --      Peak Flow --      Pain Score 10/17/19 2008 5     Pain Loc --      Pain Edu? --      Excl. in GC? --     Constitutional: Alert and oriented. Eyes: Conjunctivae are normal. Head: Atraumatic. Nose: No congestion/rhinnorhea. Mouth/Throat: Mucous membranes are moist. Neck: Normal ROM Cardiovascular: Normal rate, regular rhythm. Grossly normal heart sounds. Respiratory: Normal respiratory effort.  No retractions. Lungs CTAB. Gastrointestinal: Soft and diffusely tender to palpation with no rebound or guarding. No distention. Genitourinary: deferred Musculoskeletal: No lower extremity tenderness, trace pitting edema to bilateral lower extremities. Neurologic:  Normal speech and language.  Paraplegia noted with strength intact to bilateral upper extremities. Skin:  Skin is warm, dry and intact. No rash noted. Psychiatric: Mood and affect are normal. Speech and behavior are normal.  ____________________________________________   LABS (all labs ordered are listed, but only abnormal results are displayed)  Labs Reviewed  CBC WITH DIFFERENTIAL/PLATELET - Abnormal; Notable for the following components:      Result Value   WBC 2.9 (*)    Hemoglobin 12.2 (*)    HCT  38.1 (*)    Platelets 97 (*)    Lymphs Abs 0.5 (*)    All other components within normal limits  COMPREHENSIVE METABOLIC PANEL - Abnormal; Notable for the following components:   Calcium 8.8 (*)    All other components within normal limits  URINALYSIS, COMPLETE (UACMP) WITH MICROSCOPIC - Abnormal;  Notable for the following components:   Color, Urine AMBER (*)    APPearance CLEAR (*)    Ketones, ur 20 (*)    Protein, ur 30 (*)    Nitrite POSITIVE (*)    Leukocytes,Ua TRACE (*)    Bacteria, UA MANY (*)    All other components within normal limits  LACTIC ACID, PLASMA - Abnormal; Notable for the following components:   Lactic Acid, Venous 2.1 (*)    All other components within normal limits  POC SARS CORONAVIRUS 2 AG - Abnormal; Notable for the following components:   SARS Coronavirus 2 Ag POSITIVE (*)    All other components within normal limits  URINE CULTURE  LIPASE, BLOOD  PROCALCITONIN  LACTIC ACID, PLASMA  POC SARS CORONAVIRUS 2 AG -  ED     PROCEDURES  Procedure(s) performed (including Critical Care):  Procedures   ____________________________________________   INITIAL IMPRESSION / ASSESSMENT AND PLAN / ED COURSE       39 year old male with history of spinal cord injury and paraplegia with frequent UTIs presents to the ED for increasing abdominal, back, and lower extremity pain as well as recent development of fevers and cough.  He noticed some hemoptysis earlier today, but denies significant shortness of breath or chest pain.  He does have COVID-19 exposure from his mother, whom he lives with.  Point-of-care Covid testing is positive, although he is not in any respiratory distress, maintaining O2 sats on room air.  We will need to further assess for PE with CTA given his hemoptysis and will also check CT abdomen pelvis given his pain and tenderness.  He does not appear to have any acute neurologic change to his lower extremities, doubt cauda equina and he has 2+ DP  pulses bilaterally.  UA concerning for UTI given positive nitrites, will culture however previous culture data shows pansensitive E. coli.  Will give a dose of Rocephin.  Patient noted to have very mildly elevated lactate but remainder of labs are reassuring.  Vital signs not consistent with sepsis.  Chest x-ray consistent with COVID-19 pneumonia, doubt bacterial pneumonia given negative procalcitonin.  Patient with very difficult to control pain and repeatedly requesting doses of Dilaudid.  He was informed that we would be unable to provide any more than the 2 prior doses he has received.  We will treat nausea with Phenergan and give patient's usual baclofen for muscle spasm.  Patient turned over to oncoming provider pending CT results.  If these are negative, patient is very motivated to be discharged home and this seems appropriate given reassuring work-up.  Will prescribe Keflex for UTI and counseled patient to follow-up closely with his PCP for this as well as new diagnosis of COVID-19.      ____________________________________________   FINAL CLINICAL IMPRESSION(S) / ED DIAGNOSES  Final diagnoses:  Acute cystitis without hematuria  Injury of thoracic spinal cord, sequela ([name])  Pneumonia due to COVID-19 virus  Chronic midline low back pain without sciatica     ED Discharge Orders         Ordered    cephALEXin (KEFLEX) 500 MG capsule  2 times daily     10/18/19 9242           Note:  This document was prepared using Dragon voice recognition software and may include unintentional dictation errors.   Blake Divine, MD 10/18/19 [phone]

## 2019-10-18 NOTE — ED Notes (Signed)
Patient shouting at nurse that he needs medicine now. Explained to patient that his behavior is preventing the primary RN from being able to start and IV and that she does not feel safe. Patient states "look at me I'm paralyzed what am I going to do?" Asked patient to refrain from interrupting while I was trying to explain the treatment plan to him. He immediately calls his father out of state and says "dad listen to this". I spoke with the father and listened as he explained to me that "whatever we were doing was just going to blow by him at this point." "You all need to put him to sleep for the spasms to stop." I told the father that I would take this information to the doctor and that the doctor will be back in to speak with the patient and possibly even him. Father remained pleasant and professional while on the phone. Patient remains loud and disruptive.

## 2019-10-18 NOTE — ED Provider Notes (Signed)
CT Angio Chest PE W/Cm &/Or Wo Cm  Result Date: 10/18/2019 CLINICAL DATA:  Initial evaluation for acute cough, shortness of breath, diffuse abdominal pain. EXAM: CT ANGIOGRAPHY CHEST CT ABDOMEN AND PELVIS WITH CONTRAST TECHNIQUE: Multidetector CT imaging of the chest was performed using the standard protocol during bolus administration of intravenous contrast. Multiplanar CT image reconstructions and MIPs were obtained to evaluate the vascular anatomy. Multidetector CT imaging of the abdomen and pelvis was performed using the standard protocol during bolus administration of intravenous contrast. CONTRAST:  OMNIPAQUE IOHEXOL 350 MG/ML SOLN COMPARISON:  Comparison made with prior radiograph from earlier the same day. FINDINGS: CTA CHEST FINDINGS Cardiovascular: Normal intravascular enhancement seen throughout the intrathoracic aorta without aneurysm or other acute abnormality. Visualized great vessels within normal limits. Heart size normal. No pericardial effusion. Pulmonary arterial tree adequately opacified for evaluation. Main pulmonary artery dilated up to 3.8 cm, which can be seen the setting of underlying pulmonary hypertension. No filling defect to suggest acute pulmonary embolism identified. Re-formatted imaging confirms these findings. Mediastinum/Nodes: No pathologically enlarged mediastinal lymph nodes. Enlarged right hilar node measures up to 14 mm. Left hilar lymph nodes measure up to 9 mm. No axillary adenopathy. No mediastinal mass. Esophagus within normal limits. Lungs/Pleura: Tracheobronchial tree intact and patent. Lungs hypoinflated. Multifocal patchy opacity seen involving the bilateral upper and left lower lobes, concerning for multifocal pneumonia. Superimposed volume loss with bronchiectatic changes present at the medial left lung base. No edema or pleural effusion. No pneumothorax. No definite worrisome pulmonary nodule or mass. Musculoskeletal: External soft tissues demonstrate no  acute finding. No acute osseous abnormality. No discrete lytic or blastic osseous lesions. Thoracolumbar scoliosis with sequelae of prior fusion noted. Review of the MIP images confirms the above findings. CT ABDOMEN and PELVIS FINDINGS Hepatobiliary: Diffuse hypoattenuation liver consistent with steatosis. Subtle 13 mm hypodense lesion seen at the medial aspect of the liver (series 11, image 29), of indeterminate. Gallbladder within normal limits. No biliary dilatation. Pancreas: Diffuse fatty infiltration of the pancreas noted. Pancreas otherwise unremarkable. Spleen: Spleen within normal limits. Adrenals/Urinary Tract: Adrenal glands are normal. Kidneys equal size with symmetric enhancement. No nephrolithiasis, hydronephrosis, or focal enhancing renal mass. No hydroureter. Partially distended bladder demonstrates no acute abnormality. Fat containing bilateral inguinal hernias noted, with a portion of the urinary bladder extending into the right inguinal hernia (series 11, image 87). No associated inflammation. Stomach/Bowel: Stomach decompressed without acute abnormality. No evidence for bowel obstruction. Normal appendix. Large volume retained stool seen within the colon, suggesting constipation. Short segment loop of small bowel extends into the right inguinal hernia without associated obstruction or inflammation (series 11, image 87). No acute inflammatory changes seen about the bowels. Vascular/Lymphatic: Normal intravascular enhancement seen throughout the intra-abdominal aorta. Mesenteric vessels patent proximally. No pathologically enlarged intra-abdominal or pelvic lymph nodes. Reproductive: Prostate within normal limits. Other: No free air or fluid. Mild stranding seen along the lateral margin of the left psoas muscle, favored to be postoperative in nature (series 11, image 58). Small bilateral inguinal hernias again noted, right larger than left. Additional small fat containing paraumbilical hernia  noted as well. Musculoskeletal: No acute osseous abnormality. No discrete lytic or blastic osseous lesions. Extensive postsurgical changes present within the thoracolumbar spine. Bilateral hip dysplasia with associated osteoarthritic changes, right worse than left. Hazy soft tissue stranding seen within the subcutaneous fat posterior to the ischial tuberosities bilaterally, right greater than left (series 11, image 97). No frank soft tissue ulceration or collection. Review of the  MIP images confirms the above findings. IMPRESSION: CTA CHEST IMPRESSION 1. No CT evidence for acute pulmonary embolism. 2. Multifocal patchy airspace opacities involving the bilateral lungs, concerning for multifocal pneumonia. Changes most pronounced within the upper lobes bilaterally. 3. Dilatation of the main pulmonary artery up to 3.8 cm, suggesting underlying pulmonary hypertension. CT ABDOMEN AND PELVIS IMPRESSION 1. No acute abnormality within the abdomen and pelvis. 2. Large volume retained stool within the colon, suggesting constipation. 3. Right inguinal hernia containing a short segment loop of bowel and a portion of the urinary bladder without associated inflammation or obstruction. 4. Soft tissue stranding involving the subcutaneous fat posterior to both ischial tuberosities within the bilateral gluteal regions, right greater than left. Correlation with physical exam for possible acute infection/cellulitis recommended. No frank soft tissue ulceration or collection. 5. Hepatic steatosis. 6. 13 mm hypodense lesion at the medial aspect of the liver, indeterminate. Follow-up examination with dedicated MRI of the abdomen suggested for further characterization. This could be performed on a nonemergent outpatient basis. Electronically Signed   By: Rise Mu M.D.   On: 10/18/2019 07:30   CT Abdomen Pelvis W Contrast  Result Date: 10/18/2019 CLINICAL DATA:  Initial evaluation for acute cough, shortness of breath, diffuse  abdominal pain. EXAM: CT ANGIOGRAPHY CHEST CT ABDOMEN AND PELVIS WITH CONTRAST TECHNIQUE: Multidetector CT imaging of the chest was performed using the standard protocol during bolus administration of intravenous contrast. Multiplanar CT image reconstructions and MIPs were obtained to evaluate the vascular anatomy. Multidetector CT imaging of the abdomen and pelvis was performed using the standard protocol during bolus administration of intravenous contrast. CONTRAST:  OMNIPAQUE IOHEXOL 350 MG/ML SOLN COMPARISON:  Comparison made with prior radiograph from earlier the same day. FINDINGS: CTA CHEST FINDINGS Cardiovascular: Normal intravascular enhancement seen throughout the intrathoracic aorta without aneurysm or other acute abnormality. Visualized great vessels within normal limits. Heart size normal. No pericardial effusion. Pulmonary arterial tree adequately opacified for evaluation. Main pulmonary artery dilated up to 3.8 cm, which can be seen the setting of underlying pulmonary hypertension. No filling defect to suggest acute pulmonary embolism identified. Re-formatted imaging confirms these findings. Mediastinum/Nodes: No pathologically enlarged mediastinal lymph nodes. Enlarged right hilar node measures up to 14 mm. Left hilar lymph nodes measure up to 9 mm. No axillary adenopathy. No mediastinal mass. Esophagus within normal limits. Lungs/Pleura: Tracheobronchial tree intact and patent. Lungs hypoinflated. Multifocal patchy opacity seen involving the bilateral upper and left lower lobes, concerning for multifocal pneumonia. Superimposed volume loss with bronchiectatic changes present at the medial left lung base. No edema or pleural effusion. No pneumothorax. No definite worrisome pulmonary nodule or mass. Musculoskeletal: External soft tissues demonstrate no acute finding. No acute osseous abnormality. No discrete lytic or blastic osseous lesions. Thoracolumbar scoliosis with sequelae of prior fusion  noted. Review of the MIP images confirms the above findings. CT ABDOMEN and PELVIS FINDINGS Hepatobiliary: Diffuse hypoattenuation liver consistent with steatosis. Subtle 13 mm hypodense lesion seen at the medial aspect of the liver (series 11, image 29), of indeterminate. Gallbladder within normal limits. No biliary dilatation. Pancreas: Diffuse fatty infiltration of the pancreas noted. Pancreas otherwise unremarkable. Spleen: Spleen within normal limits. Adrenals/Urinary Tract: Adrenal glands are normal. Kidneys equal size with symmetric enhancement. No nephrolithiasis, hydronephrosis, or focal enhancing renal mass. No hydroureter. Partially distended bladder demonstrates no acute abnormality. Fat containing bilateral inguinal hernias noted, with a portion of the urinary bladder extending into the right inguinal hernia (series 11, image 87). No associated inflammation.  Stomach/Bowel: Stomach decompressed without acute abnormality. No evidence for bowel obstruction. Normal appendix. Large volume retained stool seen within the colon, suggesting constipation. Short segment loop of small bowel extends into the right inguinal hernia without associated obstruction or inflammation (series 11, image 87). No acute inflammatory changes seen about the bowels. Vascular/Lymphatic: Normal intravascular enhancement seen throughout the intra-abdominal aorta. Mesenteric vessels patent proximally. No pathologically enlarged intra-abdominal or pelvic lymph nodes. Reproductive: Prostate within normal limits. Other: No free air or fluid. Mild stranding seen along the lateral margin of the left psoas muscle, favored to be postoperative in nature (series 11, image 58). Small bilateral inguinal hernias again noted, right larger than left. Additional small fat containing paraumbilical hernia noted as well. Musculoskeletal: No acute osseous abnormality. No discrete lytic or blastic osseous lesions. Extensive postsurgical changes present  within the thoracolumbar spine. Bilateral hip dysplasia with associated osteoarthritic changes, right worse than left. Hazy soft tissue stranding seen within the subcutaneous fat posterior to the ischial tuberosities bilaterally, right greater than left (series 11, image 97). No frank soft tissue ulceration or collection. Review of the MIP images confirms the above findings. IMPRESSION: CTA CHEST IMPRESSION 1. No CT evidence for acute pulmonary embolism. 2. Multifocal patchy airspace opacities involving the bilateral lungs, concerning for multifocal pneumonia. Changes most pronounced within the upper lobes bilaterally. 3. Dilatation of the main pulmonary artery up to 3.8 cm, suggesting underlying pulmonary hypertension. CT ABDOMEN AND PELVIS IMPRESSION 1. No acute abnormality within the abdomen and pelvis. 2. Large volume retained stool within the colon, suggesting constipation. 3. Right inguinal hernia containing a short segment loop of bowel and a portion of the urinary bladder without associated inflammation or obstruction. 4. Soft tissue stranding involving the subcutaneous fat posterior to both ischial tuberosities within the bilateral gluteal regions, right greater than left. Correlation with physical exam for possible acute infection/cellulitis recommended. No frank soft tissue ulceration or collection. 5. Hepatic steatosis. 6. 13 mm hypodense lesion at the medial aspect of the liver, indeterminate. Follow-up examination with dedicated MRI of the abdomen suggested for further characterization. This could be performed on a nonemergent outpatient basis. Electronically Signed   By: Rise Mu M.D.   On: 10/18/2019 07:30   DG Chest Portable 1 View  Result Date: 10/18/2019 CLINICAL DATA:  Patient with cough. EXAM: PORTABLE CHEST 1 VIEW COMPARISON:  None. FINDINGS: Normal cardiac and mediastinal contours. Mild bilateral interstitial pulmonary opacities. No pleural effusion or pneumothorax. Thoracic  spinal fusion hardware. IMPRESSION: Mild bilateral interstitial opacities may represent atypical infectious process. Electronically Signed   By: Annia Belt M.D.   On: 10/18/2019 04:30     Imaging studies reviewed, most notable for bilateral airspace opacities.  Given the patient's diagnosis of new COVID-19, this seems consistent with coronavirus infection along with a low procalcitonin suggesting against acute bacterial pneumonia  Patient's respiratory status is reassuring.  He does have COVID-19, but does not have evidence of respiratory compromise or severe disease at this time.  Also, examined the patient with nurse Durene Cal, evaluated his peritoneum given stranding seen on his CT around ischial tuberosities and he does have some very mild slight erythema of the buttock regions, consistent with a slight pressure but no skin breakdown.  He does also have old scarring over the buttock regions bilateral, these appear to be healed pressure ulcers.  Notified patient of outpatient recommendation for further evaluation of hypodense liver lesion   ----------------------------------------- 9:11 AM on 10/18/2019 -----------------------------------------  Patient takes high doses opioids  at home, does report pain is again recurring.  Will give additional hydromorphone.  Discussed with the patient, he is strong preference would be to be discharged.  He does not appear to be in acute distress, and no his evaluation appears consistent with COVID-19, he is comfortable with plan for discharge and reports his mother is also had this.  He has resources at home, able to self quarantine.  He is nonambulatory at baseline, but has wheelchair and appropriate equipment at home.  Return precautions and treatment recommendations and follow-up discussed with the patient who is agreeable with the plan.    Delman Kitten, MD 10/18/19 [phone]

## 2019-10-18 NOTE — ED Notes (Addendum)
Charge Rn in to speak with pt regarding behavior. Pt is not allowing staff perform testing without iv in place and "pain medicine" given. Pt will not sit still for repeat blood pressure, will stop moving temporarily for attempt at iv initiation. Pt yelling at this RN, will not allow this rn to explain care process to him. Pt informed that "I want to help you and I am trying to explain to you what I need to do right now, the doctor is worried about your heart and I need an ekg." pt states he is unhappy with rn care, pt informed that he may request a different rn, pt states "don't get me started that will just take longer". Pt ringing call bell during this rn attempting to start iv and to explain to him stating "yes she needs to get the person in here to start my iv". Pt informed that the secretary does not put that order in, it is the nurse caring for the pt. Pt informed that this rn will put the order in and call for the iv team after this rn leaves the room and removes PPE for covid. Pt states "then go out there and take it off, call them and come back in here". Pt informed that it takes time to properly remove PPE and this rn does not want to waste a gown when "in thirty more seconds if you are still I will have the ekg, it's just one small job we need to do here if you will just let me". Pt continues to decline to hold still for ekg but states this rn may perform ekg. Pt is slapping his legs, rubbing his legs and yelling/screaming at the same time. Pt informed that the iv team "takes time to get here, i'll ask the doctor if I can give you the medicine Im while you wait". Pt continues to yell and argue over RN during RN speaking. Cannot obtain an assesment at this time due to behavior. At this time I no longer feel safe entering pt's room without chaperone.

## 2019-10-18 NOTE — ED Notes (Signed)
[facility] here to transport 

## 2019-10-20 DIAGNOSIS — R339 Retention of urine, unspecified: Secondary | ICD-10-CM | POA: Diagnosis not present

## 2019-10-20 LAB — URINE CULTURE: Culture: >=100000 — AB

## 2019-11-06 ENCOUNTER — Other Ambulatory Visit: Payer: Self-pay | Admitting: Family Medicine

## 2019-11-06 DIAGNOSIS — G894 Chronic pain syndrome: Secondary | ICD-10-CM

## 2019-11-06 NOTE — Telephone Encounter (Signed)
30 day supply dispensed 10/12/2019

## 2019-11-06 NOTE — Telephone Encounter (Signed)
Please review. Note patient has a different PCP listed in chart.

## 2019-11-09 ENCOUNTER — Telehealth: Payer: Self-pay

## 2019-11-09 NOTE — Telephone Encounter (Signed)
Appointment scheduled.

## 2019-11-09 NOTE — Telephone Encounter (Signed)
Schedule appointment to recheck UTI and pneumonia seen in the ER on 10-18-19.

## 2019-11-09 NOTE — Telephone Encounter (Signed)
Copied from CRM [id number]. Topic: General - Inquiry >> Nov 09, 2019  9:57 AM Deborha Payment wrote: Reason for CRM: Patient states he believes he has a bladder infection.  Patient would like to come in and bring in urine sample. Call back [phone]

## 2019-11-10 ENCOUNTER — Encounter: Payer: Self-pay | Admitting: Family Medicine

## 2019-11-10 ENCOUNTER — Other Ambulatory Visit: Payer: Self-pay

## 2019-11-10 ENCOUNTER — Ambulatory Visit (INDEPENDENT_AMBULATORY_CARE_PROVIDER_SITE_OTHER): Payer: Medicare HMO | Admitting: Family Medicine

## 2019-11-10 VITALS — BP 116/74 | HR 116 | Temp 97.1°F

## 2019-11-10 DIAGNOSIS — R69 Illness, unspecified: Secondary | ICD-10-CM | POA: Diagnosis not present

## 2019-11-10 DIAGNOSIS — K760 Fatty (change of) liver, not elsewhere classified: Secondary | ICD-10-CM

## 2019-11-10 DIAGNOSIS — G8220 Paraplegia, unspecified: Secondary | ICD-10-CM | POA: Diagnosis not present

## 2019-11-10 DIAGNOSIS — F1190 Opioid use, unspecified, uncomplicated: Secondary | ICD-10-CM

## 2019-11-10 DIAGNOSIS — J189 Pneumonia, unspecified organism: Secondary | ICD-10-CM

## 2019-11-10 DIAGNOSIS — N390 Urinary tract infection, site not specified: Secondary | ICD-10-CM | POA: Diagnosis not present

## 2019-11-10 DIAGNOSIS — Z8616 Personal history of COVID-19: Secondary | ICD-10-CM

## 2019-11-10 LAB — POCT URINALYSIS DIPSTICK
Bilirubin, UA: NEGATIVE
Blood, UA: NEGATIVE
Glucose, UA: NEGATIVE
Ketones, UA: NEGATIVE
Nitrite, UA: POSITIVE
Protein, UA: POSITIVE — AB
Spec Grav, UA: 1.015 (ref 1.010–1.025)
Urobilinogen, UA: 0.2 E.U./dL
pH, UA: 6 (ref 5.0–8.0)
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
## 2019-11-11 ENCOUNTER — Telehealth: Payer: Self-pay | Admitting: Family Medicine

## 2019-11-11 LAB — CBC WITH DIFFERENTIAL/PLATELET
Basophils Absolute: 0 10*3/uL (ref 0.0–0.2)
Basos: 0 %
EOS (ABSOLUTE): 0 10*3/uL (ref 0.0–0.4)
Eos: 1 %
Hematocrit: 41.7 % (ref 37.5–51.0)
Hemoglobin: 13.8 g/dL (ref 13.0–17.7)
Immature Grans (Abs): 0 10*3/uL (ref 0.0–0.1)
Immature Granulocytes: 0 %
Lymphocytes Absolute: 1.5 10*3/uL (ref 0.7–3.1)
Lymphs: 27 %
MCH: 27 pg (ref 26.6–33.0)
MCHC: 33.1 g/dL (ref 31.5–35.7)
MCV: 82 fL (ref 79–97)
Monocytes Absolute: 0.4 10*3/uL (ref 0.1–0.9)
Monocytes: 6 %
Neutrophils Absolute: 3.6 10*3/uL (ref 1.4–7.0)
Neutrophils: 66 %
Platelets: 239 10*3/uL (ref 150–450)
RBC: 5.11 x10E6/uL (ref 4.14–5.80)
RDW: 14.1 % (ref 11.6–15.4)
WBC: 5.5 10*3/uL (ref 3.4–10.8)

## 2019-11-11 LAB — COMPREHENSIVE METABOLIC PANEL
ALT: 16 IU/L (ref 0–44)
AST: 15 IU/L (ref 0–40)
Albumin/Globulin Ratio: 1.8 (ref 1.2–2.2)
Albumin: 4.4 g/dL (ref 4.0–5.0)
Alkaline Phosphatase: 71 IU/L (ref 39–117)
BUN/Creatinine Ratio: 21 — ABNORMAL HIGH (ref 9–20)
BUN: 15 mg/dL (ref 6–20)
Bilirubin Total: 0.3 mg/dL (ref 0.0–1.2)
CO2: 22 mmol/L (ref 20–29)
Calcium: 9.4 mg/dL (ref 8.7–10.2)
Chloride: 100 mmol/L (ref 96–106)
Creatinine, Ser: 0.72 mg/dL — ABNORMAL LOW (ref 0.76–1.27)
GFR calc Af Amer: 137 mL/min/{1.73_m2} (ref >59)
GFR calc non Af Amer: 118 mL/min/{1.73_m2} (ref >59)
Globulin, Total: 2.4 g/dL (ref 1.5–4.5)
Glucose: 73 mg/dL (ref 65–99)
Potassium: 3.9 mmol/L (ref 3.5–5.2)
Sodium: 138 mmol/L (ref 134–144)
Total Protein: 6.8 g/dL (ref 6.0–8.5)

## 2019-11-11 MED ORDER — LINACLOTIDE 145 MCG PO CAPS: 145.0000 ug | ORAL_CAPSULE | Freq: Every day | ORAL | 5 refills | Status: DC

## 2019-11-11 NOTE — Telephone Encounter (Signed)
Please advise patient insurance does not cover amitiza anymore. Need to change to Linzess. Have sent prescription for Linzess to [facility].

## 2019-11-12 ENCOUNTER — Telehealth: Payer: Self-pay

## 2019-11-12 ENCOUNTER — Other Ambulatory Visit: Payer: Self-pay | Admitting: Family Medicine

## 2019-11-12 DIAGNOSIS — F988 Other specified behavioral and emotional disorders with onset usually occurring in childhood and adolescence: Secondary | ICD-10-CM

## 2019-11-12 DIAGNOSIS — G894 Chronic pain syndrome: Secondary | ICD-10-CM

## 2019-11-12 LAB — URINE CULTURE

## 2019-11-12 MED ORDER — SULFAMETHOXAZOLE-TRIMETHOPRIM 800-160 MG PO TABS: 1.0000 | ORAL_TABLET | Freq: Two times a day (BID) | ORAL | 0 refills | Status: DC

## 2019-11-12 NOTE — Telephone Encounter (Signed)
Patient advised.

## 2019-11-12 NOTE — Telephone Encounter (Signed)
Requested medication (s) are due for refill today: yes  Requested medication (s) are on the active medication list: yes  Last refill: oxycodone 10/12/19 #120, methadone 10/12/19 #240,adderall 05/10/19 #60    Future visit scheduled: no  Notes to clinic: Refills not delegated per protocol    Requested Prescriptions  Pending Prescriptions Disp Refills   oxyCODONE (ROXICODONE) 15 MG immediate release tablet [Pharmacy Med Name: OXYCODONE HCL 15 MG TAB] 120 tablet     Sig: TAKE 1 TABLET BY MOUTH EVERY 6 HOUR AS NEEDED FOR PAIN      Not Delegated - Analgesics:  Opioid Agonists Failed - 11/12/2019  9:25 AM      Failed - This refill cannot be delegated      Failed - Urine Drug Screen completed in last 360 days.      Passed - Valid encounter within last 6 months    Recent Outpatient Visits           2 days ago Recurrent UTI   [facility] Chrismon, [place] E, [place]   4 weeks ago Fever, unspecified fever cause   [facility], Marzella Schlein, [place]   3 months ago Tinea cruris   [facility] Chrismon, Jodell Cipro, [place]   4 months ago Tinea cruris   [facility], [place] E, [place]   4 months ago Recurrent UTI   [facility] [name], Demetrios Isaacs, MD                methadone (DOLOPHINE) 10 MG tablet [Pharmacy Med Name: METHADONE HCL 10 MG TAB] 240 tablet     Sig: TAKE TWO TABLETS EVERY SIX HOURS      Not Delegated - Analgesics:  Opioid Agonists Failed - 11/12/2019  9:25 AM      Failed - This refill cannot be delegated      Failed - Urine Drug Screen completed in last 360 days.      Passed - Valid encounter within last 6 months    Recent Outpatient Visits           2 days ago Recurrent UTI   [facility] Chrismon, [place] E, [place]   4 weeks ago Fever, unspecified fever cause   [facility], Marzella Schlein, [place]   3 months ago Tinea cruris   [facility] Chrismon, Jodell Cipro,  [place]   4 months ago Tinea cruris   [facility] Chrismon, [place] E, [place]   4 months ago Recurrent UTI   [facility], Demetrios Isaacs, [place]                amphetamine-dextroamphetamine (ADDERALL) 15 MG tablet [Pharmacy Med Name: AMPHETAMINE-DEXTROAMPHETAMINE 15 MG] 60 tablet     Sig: TAKE ONE TABLET TWICE DAILY      Not Delegated - Psychiatry:  Stimulants/ADHD Failed - 11/12/2019  9:25 AM      Failed - This refill cannot be delegated      Failed - Urine Drug Screen completed in last 360 days.      Passed - Valid encounter within last 3 months    Recent Outpatient Visits           2 days ago Recurrent UTI   [facility] Chrismon, [place] E, [place]   4 weeks ago Fever, unspecified fever cause   [facility], Marzella Schlein, [place]   3 months ago Tinea cruris   [facility] Chrismon, Maurine Minister  E, [place]   4 months ago Tinea cruris   [facility], [place], [place]   4 months ago Recurrent UTI   [facility] Caryn Section, Kirstie Peri, MD

## 2019-11-12 NOTE — Telephone Encounter (Signed)
-----   Message from Jodell Cipro [place], [place] sent at 11/12/2019 12:06 PM EST ----- Blood tests are normal but urine culture still shows E.coli bacteria causing infection. This seems to be sensitive to every antibiotic tested. Recommend Septra-DS 1 tablet BID #28 and schedule recheck of urine in 2 weeks.

## 2019-11-12 NOTE — Telephone Encounter (Signed)
Patient advised. RX sent to pharmacy. Follow up scheduled.  

## 2019-11-13 NOTE — Telephone Encounter (Signed)
Patient calling back about this request. He stated that these medications were supposed to be at pharmacy yesterday. Patient requesting a call back as soon as possible to discuss.

## 2019-11-18 DIAGNOSIS — R339 Retention of urine, unspecified: Secondary | ICD-10-CM | POA: Diagnosis not present

## 2019-11-26 ENCOUNTER — Ambulatory Visit: Payer: Medicare HMO | Admitting: Family Medicine

## 2019-11-27 ENCOUNTER — Other Ambulatory Visit: Payer: Self-pay

## 2019-11-27 ENCOUNTER — Ambulatory Visit (INDEPENDENT_AMBULATORY_CARE_PROVIDER_SITE_OTHER): Payer: Medicare HMO | Admitting: Family Medicine

## 2019-11-27 ENCOUNTER — Encounter: Payer: Self-pay | Admitting: Family Medicine

## 2019-11-27 VITALS — BP 128/78 | HR 90 | Temp 97.3°F

## 2019-11-27 DIAGNOSIS — N390 Urinary tract infection, site not specified: Secondary | ICD-10-CM

## 2019-11-27 DIAGNOSIS — B354 Tinea corporis: Secondary | ICD-10-CM

## 2019-11-27 LAB — POCT URINALYSIS DIPSTICK
Bilirubin, UA: NEGATIVE
Blood, UA: NEGATIVE
Glucose, UA: NEGATIVE
Ketones, UA: NEGATIVE
Nitrite, UA: NEGATIVE
Protein, UA: POSITIVE — AB
Spec Grav, UA: 1.015 (ref 1.010–1.025)
Urobilinogen, UA: 0.2 E.U./dL
pH, UA: 5 (ref 5.0–8.0)
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
## 2019-12-07 ENCOUNTER — Other Ambulatory Visit: Payer: Self-pay | Admitting: Family Medicine

## 2019-12-10 ENCOUNTER — Other Ambulatory Visit: Payer: Self-pay

## 2019-12-10 ENCOUNTER — Encounter: Payer: Self-pay | Admitting: Family Medicine

## 2019-12-10 ENCOUNTER — Other Ambulatory Visit: Payer: Self-pay | Admitting: Family Medicine

## 2019-12-10 ENCOUNTER — Ambulatory Visit (INDEPENDENT_AMBULATORY_CARE_PROVIDER_SITE_OTHER): Payer: Medicare HMO | Admitting: Family Medicine

## 2019-12-10 VITALS — BP 112/77 | HR 88

## 2019-12-10 DIAGNOSIS — F988 Other specified behavioral and emotional disorders with onset usually occurring in childhood and adolescence: Secondary | ICD-10-CM

## 2019-12-10 DIAGNOSIS — R69 Illness, unspecified: Secondary | ICD-10-CM | POA: Diagnosis not present

## 2019-12-10 DIAGNOSIS — N390 Urinary tract infection, site not specified: Secondary | ICD-10-CM | POA: Diagnosis not present

## 2019-12-10 DIAGNOSIS — G8220 Paraplegia, unspecified: Secondary | ICD-10-CM | POA: Diagnosis not present

## 2019-12-10 DIAGNOSIS — Z634 Disappearance and death of family member: Secondary | ICD-10-CM

## 2019-12-10 DIAGNOSIS — G894 Chronic pain syndrome: Secondary | ICD-10-CM

## 2019-12-10 LAB — POCT URINALYSIS DIPSTICK
Bilirubin, UA: NEGATIVE
Blood, UA: NEGATIVE
Glucose, UA: NEGATIVE
Ketones, UA: NEGATIVE
Nitrite, UA: NEGATIVE
Protein, UA: NEGATIVE
Spec Grav, UA: 1.02 (ref 1.010–1.025)
Urobilinogen, UA: 0.2 E.U./dL
pH, UA: 7 (ref 5.0–8.0)

## 2019-12-10 MED ORDER — PROMETHAZINE HCL 25 MG PO TABS: ORAL_TABLET | ORAL | 0 refills | Status: DC
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
## 2019-12-10 NOTE — Telephone Encounter (Signed)
Requested medication (s) are due for refill today: Adderall, yes  Requested medication (s) are on the active medication list: yes  Last refill: 11/13/19  Future visit scheduled: no  Notes to clinic:  not delegated  Requested medication (s) are due for refill today: Methadone,  yes  Requested medication (s) are on the active medication list: yes  Last refill: 11/13/19  Future visit scheduled: no  Notes to clinic:  not delegated  Requested medication (s) are due for refill today: Oxycodone, yes  Requested medication (s) are on the active medication list: yes  Last refill: 11/13/19  Future visit scheduled: no  Notes to clinic:  not delegated  Requested Prescriptions  Pending Prescriptions Disp Refills   amphetamine-dextroamphetamine (ADDERALL) 15 MG tablet [Pharmacy Med Name: AMPHETAMINE-DEXTROAMPHETAMINE 15 MG] 60 tablet     Sig: TAKE ONE TABLET TWICE DAILY      Not Delegated - Psychiatry:  Stimulants/ADHD Failed - 12/10/2019  9:10 AM      Failed - This refill cannot be delegated      Failed - Urine Drug Screen completed in last 360 days.      Passed - Valid encounter within last 3 months    Recent Outpatient Visits           1 week ago Recurrent UTI   [facility] Chrismon, Jodell Cipro, [place]   1 month ago Recurrent UTI   [facility] [place], [place], [place]   1 month ago Fever, unspecified fever cause   [facility], Marzella Schlein, [place]   4 months ago Tinea cruris   [facility] Chrismon, Jodell Cipro, [place]   4 months ago Tinea cruris   [facility], [place], [place]                oxyCODONE (ROXICODONE) 15 MG immediate release tablet [Pharmacy Med Name: OXYCODONE HCL 15 MG TAB] 120 tablet     Sig: TAKE ONE TABLET BY MOUTH EVERY 6 HOURS AS NEEDED FOR PAIN      Not Delegated - Analgesics:  Opioid Agonists Failed - 12/10/2019  9:10 AM      Failed - This refill cannot be delegated     Failed - Urine Drug Screen completed in last 360 days.      Passed - Valid encounter within last 6 months    Recent Outpatient Visits           1 week ago Recurrent UTI   [facility] Chrismon, Jodell Cipro, [place]   1 month ago Recurrent UTI   [facility] [place], [place], [place]   1 month ago Fever, unspecified fever cause   [facility], Marzella Schlein, [place]   4 months ago Tinea cruris   [facility] Chrismon, Jodell Cipro, [place]   4 months ago Tinea cruris   [facility], [place], [place]                methadone (DOLOPHINE) 10 MG tablet [Pharmacy Med Name: METHADONE HCL 10 MG TAB] 240 tablet     Sig: TAKE TWO TABLETS EVERY 6 HOURS      Not Delegated - Analgesics:  Opioid Agonists Failed - 12/10/2019  9:10 AM      Failed - This refill cannot be delegated      Failed - Urine Drug Screen completed in last 360 days.      Passed - Valid encounter within last 6  months    Recent Outpatient Visits           1 week ago Recurrent UTI   [place], [place], [place]   1 month ago Recurrent UTI   [place], [place], [place]   1 month ago Fever, unspecified fever cause   [facility], Dionne Bucy, [place]   4 months ago Tinea cruris   [place], Vickki Muff, [place]   4 months ago Tinea cruris   [place], [place]

## 2019-12-10 NOTE — Telephone Encounter (Signed)
LOV  11/27/19  LRF Methadone 11/13/19  #240 Adderall    11/13/19  # 60 Oxycodone 11/13/19 # 120  Note:  Also gets #120  Alprazolam 2 mg # 120 per month Baclofen              # 120 per month

## 2019-12-11 ENCOUNTER — Ambulatory Visit: Payer: Medicare HMO | Admitting: Family Medicine

## 2019-12-11 MED ORDER — TIZANIDINE HCL 4 MG PO TABS: ORAL_TABLET | ORAL | 3 refills | Status: DC

## 2019-12-14 ENCOUNTER — Telehealth: Payer: Self-pay

## 2019-12-14 NOTE — Telephone Encounter (Signed)
Copied from CRM [id number]. Topic: General - Other >> Dec 14, 2019  1:58 PM Justin [name] wrote: Reason for CRM: Patient called to get the result of his result of the urine culture that was sent to the lab. Asking for [name] call back at Ph# [phone]

## 2019-12-14 NOTE — Telephone Encounter (Signed)
Called patient to discuss preliminary culture report not showing significant bacterial growth. Will call back with report when final results available. States he is not having any fever or significant change in symptoms (only odor and some urethral burning - suspect secondary to use of self-catheterization 5-6 times a day). Continue to drink extra fluids to flush out urinary tract.

## 2019-12-15 ENCOUNTER — Telehealth: Payer: Self-pay

## 2019-12-15 LAB — URINE CULTURE

## 2019-12-15 MED ORDER — CIPROFLOXACIN HCL 250 MG PO TABS: 250.0000 mg | ORAL_TABLET | Freq: Two times a day (BID) | ORAL | 0 refills | Status: DC

## 2019-12-15 NOTE — Telephone Encounter (Signed)
-----   Message from Tamsen Roers, [place] sent at 12/15/2019 11:46 AM EDT ----- Even though this is a very low colony count, the bacteria finally isolated would be better treated with Cipro 250 mg BID #20. If symptoms continue to return, will need to refer to an urologist for specialized help.

## 2019-12-15 NOTE — Telephone Encounter (Signed)
Patient has been advised he states that he is currently in [place] and wold like prescription to be sent to [facility] in [place]. KW

## 2019-12-21 DIAGNOSIS — R339 Retention of urine, unspecified: Secondary | ICD-10-CM | POA: Diagnosis not present

## 2020-01-06 ENCOUNTER — Other Ambulatory Visit: Payer: Self-pay | Admitting: Family Medicine

## 2020-01-06 ENCOUNTER — Telehealth: Payer: Self-pay | Admitting: Family Medicine

## 2020-01-06 DIAGNOSIS — G894 Chronic pain syndrome: Secondary | ICD-10-CM

## 2020-01-06 DIAGNOSIS — F988 Other specified behavioral and emotional disorders with onset usually occurring in childhood and adolescence: Secondary | ICD-10-CM

## 2020-01-06 NOTE — Telephone Encounter (Signed)
Requested medication (s) are due for refill today: yes  Requested medication (s) are on the active medication list: yes  Last refill: 12/11/19  Future visit scheduled: No  Notes to clinic: Not delegated    Requested Prescriptions  Pending Prescriptions Disp Refills   methadone (DOLOPHINE) 10 MG tablet [Pharmacy Med Name: METHADONE HCL 10 MG TAB] 240 tablet     Sig: TAKE TWO TABLETS EVERY 6 HOURS      Not Delegated - Analgesics:  Opioid Agonists Failed - 01/06/2020  5:14 PM      Failed - This refill cannot be delegated      Failed - Urine Drug Screen completed in last 360 days.      Passed - Valid encounter within last 6 months    Recent Outpatient Visits           3 weeks ago Recurrent UTI   [facility] Chrismon, Jodell Cipro, [place]   1 month ago Recurrent UTI   [facility] Chrismon, [place], [place]   1 month ago Recurrent UTI   [facility], [place], [place]   2 months ago Fever, unspecified fever cause   [facility], Marzella Schlein, [place]   5 months ago Tinea cruris   [facility] Chrismon, [place], [place]                oxyCODONE (ROXICODONE) 15 MG immediate release tablet [Pharmacy Med Name: OXYCODONE HCL 15 MG TAB] 120 tablet     Sig: TAKE ONE TABLET BY MOUTH EVERY 6 HOURS AS NEEDED FOR PAIN      Not Delegated - Analgesics:  Opioid Agonists Failed - 01/06/2020  5:14 PM      Failed - This refill cannot be delegated      Failed - Urine Drug Screen completed in last 360 days.      Passed - Valid encounter within last 6 months    Recent Outpatient Visits           3 weeks ago Recurrent UTI   [facility] Chrismon, Jodell Cipro, [place]   1 month ago Recurrent UTI   [facility] Chrismon, [place], [place]   1 month ago Recurrent UTI   [facility], [place], [place]   2 months ago Fever, unspecified fever cause   [facility], Marzella Schlein, [place]   5 months ago Tinea cruris   [facility] Chrismon, Jodell Cipro, [place]                amphetamine-dextroamphetamine (ADDERALL) 15 MG tablet [Pharmacy Med Name: AMPHETAMINE-DEXTROAMPHETAMINE 15 MG] 60 tablet     Sig: TAKE ONE TABLET TWICE DAILY      Not Delegated - Psychiatry:  Stimulants/ADHD Failed - 01/06/2020  5:14 PM      Failed - This refill cannot be delegated      Failed - Urine Drug Screen completed in last 360 days.      Passed - Valid encounter within last 3 months    Recent Outpatient Visits           3 weeks ago Recurrent UTI   [facility] Chrismon, Jodell Cipro, [place]   1 month ago Recurrent UTI   [facility], [place], [place]   1 month ago Recurrent UTI   [facility] [place], [place], [place]   2 months ago Fever, unspecified fever cause   [facility] [place], Dionne Bucy, [place]   5 months ago Tinea cruris   [place], [place]

## 2020-01-06 NOTE — Telephone Encounter (Addendum)
Rx for QC Natura-Lax will not E-scribe- printed at office- may have to be faxed to pharmacy for RF.

## 2020-01-06 NOTE — Telephone Encounter (Signed)
Requested medication (s) are due for refill today: yes  Requested medication (s) are on the active medication list: yes   Last refill: 12/10/19   #30  0 refills  Future visit scheduled No  Notes to clinic: Not delegated  Requested Prescriptions  Pending Prescriptions Disp Refills   promethazine (PHENERGAN) 25 MG tablet [Pharmacy Med Name: PROMETHAZINE HCL 25 MG TAB] 30 tablet 0    Sig: TAKE 1 TABLET BY MOUTH EVERY 4 HOURS AS NEEDED FOR NAUSEA/VOMITING      Not Delegated - Gastroenterology: Antiemetics Failed - 01/06/2020  5:29 PM      Failed - This refill cannot be delegated      Passed - Valid encounter within last 6 months    Recent Outpatient Visits           3 weeks ago Recurrent UTI   [facility] [place], Jodell [place]ipro, [place]   1 month ago Recurrent UTI   [facility], [place], [place]   1 month ago Recurrent UTI   [facility], [place], [place]   2 months ago Fever, unspecified fever cause   [facility], Marzella Schlein, [place]   5 months ago Tinea cruris   [facility] Chrismon, Jodell Cipro, [place]

## 2020-01-07 ENCOUNTER — Other Ambulatory Visit: Payer: Self-pay

## 2020-01-07 MED ORDER — POLYETHYLENE GLYCOL 3350 17 GM/SCOOP PO POWD: ORAL | 3 refills | Status: DC

## 2020-01-20 DIAGNOSIS — R339 Retention of urine, unspecified: Secondary | ICD-10-CM | POA: Diagnosis not present

## 2020-02-01 ENCOUNTER — Telehealth: Payer: Self-pay | Admitting: Family Medicine

## 2020-02-01 DIAGNOSIS — N390 Urinary tract infection, site not specified: Secondary | ICD-10-CM

## 2020-02-01 NOTE — Telephone Encounter (Signed)
With his history of paralysis and has to use catheter frequently, I can check the urine first and get culture. May need to schedule office visit or urology referral pending reports.

## 2020-02-01 NOTE — Telephone Encounter (Signed)
Arman Bogus [facility] that we prefer patient's be seen when having symptoms.  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

## 2020-02-01 NOTE — Telephone Encounter (Signed)
Orders have been placed.  LMTCB, [facility] Triage Nurse may give patient results

## 2020-02-01 NOTE — Telephone Encounter (Signed)
Patient called, left VM to return the call to the office. 

## 2020-02-01 NOTE — Telephone Encounter (Signed)
Copied from CRM [id number]. Topic: General - Other >> Feb 01, 2020  8:24 AM Tamela Oddi wrote: Reason for CRM: Patient called to ask if he can drop off a specimen of his urine because he believes he has a bladder infection.  He stated he is burning and it has a bad odor.  Please advise and call patient to lt him know what he should do.  He stated he would like to drop it off as soon as possible.  CB# [phone]

## 2020-02-02 NOTE — Telephone Encounter (Signed)
Spoke with patient. Offered an appointment but he prefers to drop off  urine sample to the office today. He continues to have leg cramps.Stated he has medication for the cramps and will continue to take. Discussed if any fever develops or the symptoms worsen to call back for appointment.

## 2020-02-05 ENCOUNTER — Ambulatory Visit: Payer: Medicare HMO | Admitting: Family Medicine

## 2020-02-05 ENCOUNTER — Other Ambulatory Visit: Payer: Self-pay | Admitting: Family Medicine

## 2020-02-05 DIAGNOSIS — F988 Other specified behavioral and emotional disorders with onset usually occurring in childhood and adolescence: Secondary | ICD-10-CM

## 2020-02-05 DIAGNOSIS — G894 Chronic pain syndrome: Secondary | ICD-10-CM

## 2020-02-05 NOTE — Telephone Encounter (Signed)
Requested medication (s) are due for refill today: yes  Requested medication (s) are on the active medication list: yes  Last refill:  01/08/2020  Future visit scheduled: yes  Notes to clinic: this refill cannot be delegated    Requested Prescriptions  Pending Prescriptions Disp Refills   amphetamine-dextroamphetamine (ADDERALL) 15 MG tablet [Pharmacy Med Name: AMPHETAMINE-DEXTROAMPHETAMINE 15 MG] 60 tablet     Sig: TAKE ONE TABLET TWICE DAILY      Not Delegated - Psychiatry:  Stimulants/ADHD Failed - 02/05/2020  1:28 PM      Failed - This refill cannot be delegated      Failed - Urine Drug Screen completed in last 360 days.      Passed - Valid encounter within last 3 months    Recent Outpatient Visits           1 month ago Recurrent UTI   [facility] Chrismon, Jodell Cipro, [place]   2 months ago Recurrent UTI   [facility] [place], [place], [place]   2 months ago Recurrent UTI   [facility], [place], [place]   3 months ago Fever, unspecified fever cause   [facility], Marzella Schlein, [place]   6 months ago Tinea cruris   [place] Chrismon, Jodell Cipro, [place]       Future Appointments             In 3 days Chrismon, Jodell Cipro, [place] [facility], [facility]

## 2020-02-08 ENCOUNTER — Other Ambulatory Visit: Payer: Self-pay

## 2020-02-08 ENCOUNTER — Ambulatory Visit (INDEPENDENT_AMBULATORY_CARE_PROVIDER_SITE_OTHER): Payer: Medicare HMO | Admitting: Family Medicine

## 2020-02-08 ENCOUNTER — Encounter: Payer: Self-pay | Admitting: Family Medicine

## 2020-02-08 VITALS — BP 132/86 | HR 115 | Temp 97.5°F

## 2020-02-08 DIAGNOSIS — F329 Major depressive disorder, single episode, unspecified: Secondary | ICD-10-CM | POA: Diagnosis not present

## 2020-02-08 DIAGNOSIS — N390 Urinary tract infection, site not specified: Secondary | ICD-10-CM

## 2020-02-08 DIAGNOSIS — S24109S Unspecified injury at unspecified level of thoracic spinal cord, sequela: Secondary | ICD-10-CM

## 2020-02-08 DIAGNOSIS — T83511D Infection and inflammatory reaction due to indwelling urethral catheter, subsequent encounter: Secondary | ICD-10-CM | POA: Diagnosis not present

## 2020-02-08 DIAGNOSIS — R69 Illness, unspecified: Secondary | ICD-10-CM | POA: Diagnosis not present

## 2020-02-08 DIAGNOSIS — F32A Depression, unspecified: Secondary | ICD-10-CM
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
## 2020-02-10 LAB — URINE CULTURE

## 2020-02-11 ENCOUNTER — Other Ambulatory Visit: Payer: Self-pay

## 2020-02-11 DIAGNOSIS — N390 Urinary tract infection, site not specified: Secondary | ICD-10-CM

## 2020-02-11 MED ORDER — AMOXICILLIN-POT CLAVULANATE 875-125 MG PO TABS: 1.0000 | ORAL_TABLET | Freq: Two times a day (BID) | ORAL | 0 refills | Status: DC

## 2020-02-11 NOTE — Telephone Encounter (Signed)
Requested medication (s) are due for refill today: yes  Requested medication (s) are on the active medication list: no  Last refill:   Future visit scheduled: no  Notes to clinic: no assigned protocol    Requested Prescriptions  Pending Prescriptions Disp Refills   amoxicillin-clavulanate (AUGMENTIN) 875-125 MG tablet 20 tablet 0    Sig: Take 1 tablet by mouth 2 (two) times daily.      Off-Protocol Failed - 02/11/2020 11:36 AM      Failed - Medication not assigned to a protocol, review manually.      Passed - Valid encounter within last 12 months    Recent Outpatient Visits           3 days ago Urinary tract infection associated with catheterization of urinary tract, unspecified indwelling urinary catheter type, subsequent encounter   [facility] Chrismon, Jodell Cipro, [place]   2 months ago Recurrent UTI   [facility], [place], [place]   2 months ago Recurrent UTI   [facility], [place], [place]   3 months ago Recurrent UTI   [facility], [place], [place]   4 months ago Fever, unspecified fever cause   [facility], Marzella Schlein, MD

## 2020-02-11 NOTE — Telephone Encounter (Signed)
-----   Message from Tamsen Roers, [place] sent at 02/11/2020 10:43 AM EDT ----- Intestinal bacteria (E.coli) grew on the urine culture. Sensitivity indicates Augmentin 875 mg BID #20 should help clear this. The frequency of UTI's he has had indicates a need for urology referral if any fever or persistence after this antibiotic.

## 2020-02-11 NOTE — Telephone Encounter (Signed)
Tried calling patient. Left message to call back. OK for PEC Triage to advise patient of message below.

## 2020-02-18 DIAGNOSIS — R339 Retention of urine, unspecified: Secondary | ICD-10-CM | POA: Diagnosis not present

## 2020-02-23 ENCOUNTER — Other Ambulatory Visit: Payer: Self-pay | Admitting: Family Medicine

## 2020-02-23 NOTE — Telephone Encounter (Signed)
Requested medication (s) are due for refill today: Yes  Requested medication (s) are on the active medication list: Yes  Last refill:  08/18/19  Future visit scheduled: No  Notes to clinic:  See request,    Requested Prescriptions  Pending Prescriptions Disp Refills   alprazolam (XANAX) 2 MG tablet [Pharmacy Med Name: ALPRAZOLAM 2 MG TAB] 120 tablet     Sig: TAKE ONE TABLET EVERY SIX HOURS IF NEEDED      Not Delegated - Psychiatry:  Anxiolytics/Hypnotics Failed - 02/23/2020  6:32 PM      Failed - This refill cannot be delegated      Failed - Urine Drug Screen completed in last 360 days.      Passed - Valid encounter within last 6 months    Recent Outpatient Visits           2 weeks ago Urinary tract infection associated with catheterization of urinary tract, unspecified indwelling urinary catheter type, subsequent encounter   [facility] Chrismon, Jodell Cipro, [place]   2 months ago Recurrent UTI   [facility] [place], [place], [place]   2 months ago Recurrent UTI   [facility], [place], [place]   3 months ago Recurrent UTI   [facility], [place], [place]   4 months ago Fever, unspecified fever cause   [facility], Marzella Schlein, MD

## 2020-02-23 NOTE — Telephone Encounter (Signed)
Pt called stating that he is completely out of his medication, please advise

## 2020-03-08 ENCOUNTER — Other Ambulatory Visit: Payer: Self-pay | Admitting: Family Medicine

## 2020-03-08 DIAGNOSIS — F339 Major depressive disorder, recurrent, unspecified: Secondary | ICD-10-CM

## 2020-03-12 ENCOUNTER — Other Ambulatory Visit: Payer: Self-pay | Admitting: Family Medicine

## 2020-03-12 DIAGNOSIS — G894 Chronic pain syndrome: Secondary | ICD-10-CM

## 2020-03-12 NOTE — Telephone Encounter (Signed)
Requested medication (s) are due for refill today: yes  Requested medication (s) are on the active medication list: yes  Last refill:  02/06/20 for both meds requested  Future visit scheduled: no  Notes to clinic:  both meds not delegated to NT to RF   Requested Prescriptions  Pending Prescriptions Disp Refills   methadone (DOLOPHINE) 10 MG tablet [Pharmacy Med Name: METHADONE HCL 10 MG TAB] 240 tablet     Sig: TAKE TWO TABLETS EVERY 6 HOURS      Not Delegated - Analgesics:  Opioid Agonists Failed - 03/12/2020  1:39 PM      Failed - This refill cannot be delegated      Failed - Urine Drug Screen completed in last 360 days.      Passed - Valid encounter within last 6 months    Recent Outpatient Visits           1 month ago Urinary tract infection associated with catheterization of urinary tract, unspecified indwelling urinary catheter type, subsequent encounter   [facility] Chrismon, Jodell [place], [place]   3 months ago Recurrent UTI   [facility] Chrismon, [place], [place]   3 months ago Recurrent UTI   [facility], [place], [place]   4 months ago Recurrent UTI   [facility], [place], [place]   5 months ago Fever, unspecified fever cause   [facility], Marzella Schlein, MD                oxyCODONE (ROXICODONE) 15 MG immediate release tablet [Pharmacy Med Name: OXYCODONE HCL 15 MG TAB] 120 tablet     Sig: TAKE ONE TABLET BY MOUTH EVERY 6 HOURS AS NEEDED FOR PAIN      Not Delegated - Analgesics:  Opioid Agonists Failed - 03/12/2020  1:39 PM      Failed - This refill cannot be delegated      Failed - Urine Drug Screen completed in last 360 days.      Passed - Valid encounter within last 6 months    Recent Outpatient Visits           1 month ago Urinary tract infection associated with catheterization of urinary tract, unspecified indwelling urinary catheter type, subsequent encounter    [facility] Chrismon, Jodell Cipro, [place]   3 months ago Recurrent UTI   [facility] [place], [place], [place]   3 months ago Recurrent UTI   [facility], [place], [place]   4 months ago Recurrent UTI   [facility], [place], [place]   5 months ago Fever, unspecified fever cause   [facility], Marzella Schlein, MD

## 2020-03-12 NOTE — Telephone Encounter (Signed)
Requested medication (s) are due for refill today: no  Requested medication (s) are on the active medication list: yes  Last refill:  02/25/20  Future visit scheduled: no  Notes to clinic:  med not delegated to NT to RF   Requested Prescriptions  Pending Prescriptions Disp Refills   alprazolam (XANAX) 2 MG tablet [Pharmacy Med Name: ALPRAZOLAM 2 MG TAB] 120 tablet     Sig: TAKE ONE TABLET EVERY SIX HOURS IF NEEDED      Not Delegated - Psychiatry:  Anxiolytics/Hypnotics Failed - 03/12/2020  1:40 PM      Failed - This refill cannot be delegated      Failed - Urine Drug Screen completed in last 360 days.      Passed - Valid encounter within last 6 months    Recent Outpatient Visits           1 month ago Urinary tract infection associated with catheterization of urinary tract, unspecified indwelling urinary catheter type, subsequent encounter   [facility] [place] [facility] Chrismon, Jodell Cipro, [place]   3 months ago Recurrent UTI   [facility] [place], [place], [place]   3 months ago Recurrent UTI   [facility], [place], [place]   4 months ago Recurrent UTI   [facility], [place], [place]   5 months ago Fever, unspecified fever cause   [facility], Marzella Schlein, MD

## 2020-03-25 ENCOUNTER — Telehealth: Payer: Self-pay | Admitting: Family Medicine

## 2020-03-25 DIAGNOSIS — R339 Retention of urine, unspecified: Secondary | ICD-10-CM | POA: Diagnosis not present

## 2020-03-25 DIAGNOSIS — N3090 Cystitis, unspecified without hematuria: Secondary | ICD-10-CM

## 2020-03-25 NOTE — Telephone Encounter (Signed)
Pt states he has another bladder infection. Sx include terrible, strong odor. Bladder and leg spasms.  Pt states Maurine Minister knows about his issue, and all he needs to do is call and an order can be put in, and he will bring the specimen to lab.  Please call when order is in. Pt is requesting order asap, as he is going out today and he does not stay out long.

## 2020-03-25 NOTE — Telephone Encounter (Signed)
Should bring specimen in for culture.

## 2020-03-28 DIAGNOSIS — N3090 Cystitis, unspecified without hematuria: Secondary | ICD-10-CM | POA: Diagnosis not present

## 2020-03-28 NOTE — Telephone Encounter (Signed)
Patient advised.

## 2020-03-28 NOTE — Addendum Note (Signed)
Addended by: Aleene Davidson on: 03/28/2020 05:00 PM   Modules accepted: Orders

## 2020-03-30 LAB — URINE CULTURE

## 2020-03-31 ENCOUNTER — Telehealth: Payer: Self-pay

## 2020-03-31 ENCOUNTER — Other Ambulatory Visit: Payer: Self-pay | Admitting: Adult Health

## 2020-03-31 DIAGNOSIS — B9620 Unspecified Escherichia coli [E. coli] as the cause of diseases classified elsewhere: Secondary | ICD-10-CM

## 2020-03-31 DIAGNOSIS — N390 Urinary tract infection, site not specified: Secondary | ICD-10-CM

## 2020-03-31 MED ORDER — DOXYCYCLINE HYCLATE 100 MG PO TABS: 100.0000 mg | ORAL_TABLET | Freq: Two times a day (BID) | ORAL | 0 refills | Status: DC

## 2020-03-31 NOTE — Telephone Encounter (Signed)
Justin [name] ordered a urine culture on this patient. Please review urine culture results.   Copied from CRM [id number]. Topic: General - Other >> Mar 31, 2020 10:39 AM Justin [name] wrote: Reason for CRM:Pt would like a call to go over urine results / if antibiotic is sent to pharmacy please let pt know/ please advise

## 2020-03-31 NOTE — Progress Notes (Signed)
Meds ordered this encounter  Medications  . doxycycline (VIBRA-TABS) 100 MG tablet    Sig: Take 1 tablet (100 mg total) by mouth 2 (two) times daily.    Dispense:  20 tablet    Refill:  0    Urinary tract infection, E. coli

## 2020-03-31 NOTE — Telephone Encounter (Signed)
Meds ordered this encounter  Medications   doxycycline (VIBRA-TABS) 100 MG tablet    Sig: Take 1 tablet (100 mg total) by mouth 2 (two) times daily.    Dispense:  20 tablet    Refill:  0   E coli, on urine culture be sure he is not having signs of pyelonephritis back pain, fever , chills  ?  Will treat with Doxycycline above, take with food, do not lay down flat within one hour of taking and can cause sun sensitivity.   We need to refer to urology may place as urgent fr recurrent E coli UTI and list patient history to location of his choice.  Needs office visit if any new or changing symptoms since seeing Justin [name] in office last.  ER for any pyelonephritis signs.

## 2020-04-07 NOTE — Telephone Encounter (Signed)
Left message for patient to return call okay for [facility] triage to advise of message below.

## 2020-04-08 ENCOUNTER — Other Ambulatory Visit: Payer: Self-pay | Admitting: Family Medicine

## 2020-04-08 NOTE — Telephone Encounter (Signed)
Requested Prescriptions  Pending Prescriptions Disp Refills  . LINZESS 145 MCG CAPS capsule [Pharmacy Med Name: LINZESS 145 MCG CAP] 90 capsule 1    Sig: TAKE 1 CAPSULE BY MOUTH DAILY BEFORE BREAKFAST.     Gastroenterology: Irritable Bowel Syndrome Passed - 04/08/2020  3:32 PM      Passed - Valid encounter within last 12 months    Recent Outpatient Visits          2 months ago Urinary tract infection associated with catheterization of urinary tract, unspecified indwelling urinary catheter type, subsequent encounter   [facility] Chrismon, Jodell [place], [place]   4 months ago Recurrent UTI   [facility] [place], [place], [place]   4 months ago Recurrent UTI   [facility], [place], [place]   5 months ago Recurrent UTI   [facility], [place], [place]   5 months ago Fever, unspecified fever cause   [facility], Marzella Schlein, MD

## 2020-04-08 NOTE — Telephone Encounter (Signed)
Attempted to call pt.  Left vm to return call to office to discuss urine culture results and recommendations.

## 2020-04-08 NOTE — Telephone Encounter (Signed)
Patient called and left message on voicemail to return call to office.

## 2020-04-11 ENCOUNTER — Other Ambulatory Visit: Payer: Self-pay | Admitting: Family Medicine

## 2020-04-11 DIAGNOSIS — G894 Chronic pain syndrome: Secondary | ICD-10-CM

## 2020-04-11 NOTE — Telephone Encounter (Signed)
Left message once again for patient to return call. Multiple attempts have been made to reach out to patient, please advise if okay to close encounter. KW

## 2020-04-11 NOTE — Telephone Encounter (Signed)
Requested medications are due for refill today? Yes - These Refills cannot be delegated.    Requested medications are on active medication list?  Yes  Last Refill:    Oxycodone (ROXICODONE)  03/13/2020  # 120 tabs with no refills  Methadone (DOLOPHINE)  03/13/2020     # 240 with no refills   Future visit scheduled?  No   Notes to Clinic:    These medications refills cannot be delegated.

## 2020-04-12 NOTE — Telephone Encounter (Signed)
Ok

## 2020-04-20 ENCOUNTER — Ambulatory Visit: Payer: Medicare HMO | Admitting: Physician Assistant

## 2020-04-20 NOTE — Progress Notes (Deleted)
Established patient visit   Patient: Justin [name]   DOB: 11-10-1980   39 y.o. Male  MRN: [id number] Visit Date: 04/21/2020  Today's healthcare provider: Trey Sailors, PA-C   No chief complaint on file.  Subjective    HPI  ***  {Show patient history (optional):23778::" "}   Medications: Outpatient Medications Prior to Visit  Medication Sig  . albuterol (PROVENTIL HFA;VENTOLIN HFA) 108 (90 Base) MCG/ACT inhaler Inhale 2 puffs into the lungs every 6 (six) hours as needed for wheezing or shortness of breath.  . alprazolam (XANAX) 2 MG tablet TAKE ONE TABLET EVERY SIX HOURS IF NEEDED  . amphetamine-dextroamphetamine (ADDERALL) 15 MG tablet TAKE ONE TABLET TWICE DAILY  . baclofen (LIORESAL) 20 MG tablet TAKE ONE TABLET BY MOUTH FOUR TIMES DAILY  . clotrimazole-betamethasone (LOTRISONE) cream Apply 1 application topically 2 (two) times daily.  . diphenhydrAMINE (BENADRYL) 25 MG tablet Take 1 tablet by mouth as needed.  . docusate sodium (COLACE) 250 MG capsule Take 1 capsule (250 mg total) by mouth 4 (four) times daily as needed.  . doxycycline (VIBRA-TABS) 100 MG tablet Take 1 tablet (100 mg total) by mouth 2 (two) times daily.  Marland Kitchen dronabinol (MARINOL) 10 MG capsule Take 1 capsule (10 mg total) by mouth every 6 (six) hours as needed.  . flavoxATE (URISPAS) 100 MG tablet Take 1 tablet (100 mg total) by mouth 3 (three) times daily as needed for bladder spasms.  . fluticasone (FLONASE) 50 MCG/ACT nasal spray Place 2 sprays into both nostrils daily.  Marland Kitchen gabapentin (NEURONTIN) 800 MG tablet Take 1 tablet (800 mg total) by mouth 4 (four) times daily.  Marland Kitchen levothyroxine (SYNTHROID) 75 MCG tablet TAKE ONE TABLET ON AN EMPTY STOMACH WITHA GLASS OF WATER AT LEAST 30 TO 60 MINUTES BEFORE BREAKFAST  . lidocaine (LMX) 4 % cream Apply 1 application topically 3 (three) times daily.  Marland Kitchen LINZESS 145 MCG CAPS capsule TAKE 1 CAPSULE BY MOUTH DAILY BEFORE BREAKFAST.  . methadone  (DOLOPHINE) 10 MG tablet TAKE TWO TABLETS EVERY 6 HOURS  . Mineral Oil OIL daily.  . naproxen (NAPROSYN) 500 MG tablet TAKE ONE TABLET TWICE DAILY  . omeprazole (PRILOSEC) 20 MG capsule TAKE 1 CAPSULE BY MOUTH EVERY DAY  . oxyCODONE (ROXICODONE) 15 MG immediate release tablet TAKE ONE TABLET BY MOUTH EVERY 6 HOURS AS NEEDED FOR PAIN  . PARoxetine (PAXIL) 30 MG tablet TAKE 2 TABLETS BY MOUTH DAILY  . phenazopyridine (PYRIDIUM) 200 MG tablet Take 1 tablet (200 mg total) by mouth 3 (three) times daily.  . polyethylene glycol powder (QC NATURA-LAX) 17 GM/SCOOP powder MIX 1 CAPFUL IN 8 0Z OF WATER OR JUICE AND DRINK ONCE A DAY  . promethazine (PHENERGAN) 25 MG suppository Place 1 suppository (25 mg total) rectally every 6 (six) hours as needed for nausea or vomiting.  . promethazine (PHENERGAN) 25 MG tablet TAKE 1 TABLET BY MOUTH EVERY 4 HOURS AS NEEDED FOR NAUSEA/VOMITING  . sildenafil (REVATIO) 20 MG tablet Take 3 to 5 tablets two hours before intercouse on an empty stomach.  Do not take with nitrates.  . silver sulfADIAZINE (SILVADENE) 1 % cream Apply 1 application topically 2 (two) times daily.  Marland Kitchen tiZANidine (ZANAFLEX) 4 MG tablet TAKE TWO TABLETS FOUR TIMES DAILY  . TOVIAZ 8 MG TB24 tablet TAKE 1 TABLET BY MOUTH DAILY  . VESICARE 10 MG tablet TAKE 1 TABLET BY MOUTH DAILY   Facility-Administered Medications Prior to Visit  Medication Dose Route Frequency  Provider  . methylPREDNISolone acetate (DEPO-MEDROL) injection 40 mg  40 mg Intramuscular Once Malva Limes, MD    Review of Systems  {Heme  Chem  Endocrine  Serology  Results Review (optional):23779::" "}  Objective    There were no vitals taken for this visit. {Show previous vital signs (optional):23777::" "}  Physical Exam  ***  No results found for any visits on 04/21/20.  Assessment & Plan     ***  No follow-ups on file.      {provider attestation***:1}   Maryella Shivers  [facility] [phone] (phone) [phone] (fax)  [facility]

## 2020-04-20 NOTE — Progress Notes (Deleted)
Established patient visit   Patient: Justin [name]   DOB: [birth year]   39 y.o. Male  MRN: [id number] Visit Date: 04/20/2020  Today's healthcare provider: Trey Sailors, PA-C   No chief complaint on file.  Subjective    HPI  ***  {Show patient history (optional):23778::" "}   Medications: Outpatient Medications Prior to Visit  Medication Sig  . albuterol (PROVENTIL HFA;VENTOLIN HFA) 108 (90 Base) MCG/ACT inhaler Inhale 2 puffs into the lungs every 6 (six) hours as needed for wheezing or shortness of breath.  . alprazolam (XANAX) 2 MG tablet TAKE ONE TABLET EVERY SIX HOURS IF NEEDED  . amphetamine-dextroamphetamine (ADDERALL) 15 MG tablet TAKE ONE TABLET TWICE DAILY  . baclofen (LIORESAL) 20 MG tablet TAKE ONE TABLET BY MOUTH FOUR TIMES DAILY  . clotrimazole-betamethasone (LOTRISONE) cream Apply 1 application topically 2 (two) times daily.  . diphenhydrAMINE (BENADRYL) 25 MG tablet Take 1 tablet by mouth as needed.  . docusate sodium (COLACE) 250 MG capsule Take 1 capsule (250 mg total) by mouth 4 (four) times daily as needed.  . doxycycline (VIBRA-TABS) 100 MG tablet Take 1 tablet (100 mg total) by mouth 2 (two) times daily.  Marland Kitchen dronabinol (MARINOL) 10 MG capsule Take 1 capsule (10 mg total) by mouth every 6 (six) hours as needed.  . flavoxATE (URISPAS) 100 MG tablet Take 1 tablet (100 mg total) by mouth 3 (three) times daily as needed for bladder spasms.  . fluticasone (FLONASE) 50 MCG/ACT nasal spray Place 2 sprays into both nostrils daily.  Marland Kitchen gabapentin (NEURONTIN) 800 MG tablet Take 1 tablet (800 mg total) by mouth 4 (four) times daily.  Marland Kitchen levothyroxine (SYNTHROID) 75 MCG tablet TAKE ONE TABLET ON AN EMPTY STOMACH WITHA GLASS OF WATER AT LEAST 30 TO 60 MINUTES BEFORE BREAKFAST  . lidocaine (LMX) 4 % cream Apply 1 application topically 3 (three) times daily.  Marland Kitchen LINZESS 145 MCG CAPS capsule TAKE 1 CAPSULE BY MOUTH DAILY BEFORE BREAKFAST.  . methadone  (DOLOPHINE) 10 MG tablet TAKE TWO TABLETS EVERY 6 HOURS  . Mineral Oil OIL daily.  . naproxen (NAPROSYN) 500 MG tablet TAKE ONE TABLET TWICE DAILY  . omeprazole (PRILOSEC) 20 MG capsule TAKE 1 CAPSULE BY MOUTH EVERY DAY  . oxyCODONE (ROXICODONE) 15 MG immediate release tablet TAKE ONE TABLET BY MOUTH EVERY 6 HOURS AS NEEDED FOR PAIN  . PARoxetine (PAXIL) 30 MG tablet TAKE 2 TABLETS BY MOUTH DAILY  . phenazopyridine (PYRIDIUM) 200 MG tablet Take 1 tablet (200 mg total) by mouth 3 (three) times daily.  . polyethylene glycol powder (QC NATURA-LAX) 17 GM/SCOOP powder MIX 1 CAPFUL IN 8 0Z OF WATER OR JUICE AND DRINK ONCE A DAY  . promethazine (PHENERGAN) 25 MG suppository Place 1 suppository (25 mg total) rectally every 6 (six) hours as needed for nausea or vomiting.  . promethazine (PHENERGAN) 25 MG tablet TAKE 1 TABLET BY MOUTH EVERY 4 HOURS AS NEEDED FOR NAUSEA/VOMITING  . sildenafil (REVATIO) 20 MG tablet Take 3 to 5 tablets two hours before intercouse on an empty stomach.  Do not take with nitrates.  . silver sulfADIAZINE (SILVADENE) 1 % cream Apply 1 application topically 2 (two) times daily.  Marland Kitchen tiZANidine (ZANAFLEX) 4 MG tablet TAKE TWO TABLETS FOUR TIMES DAILY  . TOVIAZ 8 MG TB24 tablet TAKE 1 TABLET BY MOUTH DAILY  . VESICARE 10 MG tablet TAKE 1 TABLET BY MOUTH DAILY   Facility-Administered Medications Prior to Visit  Medication Dose Route Frequency  Provider  . methylPREDNISolone acetate (DEPO-MEDROL) injection 40 mg  40 mg Intramuscular Once Malva Limes, MD    Review of Systems  {Heme  Chem  Endocrine  Serology  Results Review (optional):23779::" "}  Objective    There were no vitals taken for this visit. {Show previous vital signs (optional):23777::" "}  Physical Exam  ***  No results found for any visits on 04/20/20.  Assessment & Plan     ***  No follow-ups on file.      {provider attestation***:1}   Maryella Shivers  [facility] [phone] (phone) [phone] (fax)  [facility]

## 2020-04-21 ENCOUNTER — Telehealth: Payer: Self-pay

## 2020-04-21 ENCOUNTER — Telehealth: Payer: Self-pay | Admitting: Family Medicine

## 2020-04-21 ENCOUNTER — Ambulatory Visit: Payer: Medicare HMO | Admitting: Physician Assistant

## 2020-04-21 DIAGNOSIS — N390 Urinary tract infection, site not specified: Secondary | ICD-10-CM

## 2020-04-21 DIAGNOSIS — G8220 Paraplegia, unspecified: Secondary | ICD-10-CM

## 2020-04-21 NOTE — Telephone Encounter (Signed)
Patient is calling to schedule an appt. Patient was advised that his provider is Maurine Minister. Patient states that he orginally was a patient of Dr. [name]. And did not authorize his provider to be changed to [place]. Patient is scheduled with Ricki Rodriguez tomorrow since Maurine Minister is out. Patient also has future appt scheduled with Dr. [name] for Anxiety in August. Please on the correct provider for the patient.

## 2020-04-21 NOTE — Telephone Encounter (Signed)
Copied from CRM [id number]. Topic: Referral - Request for Referral >> Apr 21, 2020  7:49 AM Crist Infante wrote: Pt had referral last Nov 2020 for a urologist.  Pt wants to know if you could put that referral in again, he was not able to go last year.

## 2020-04-22 ENCOUNTER — Ambulatory Visit: Payer: Medicare HMO | Admitting: Physician Assistant

## 2020-04-22 ENCOUNTER — Encounter: Payer: Self-pay | Admitting: Physician Assistant

## 2020-04-22 ENCOUNTER — Ambulatory Visit (INDEPENDENT_AMBULATORY_CARE_PROVIDER_SITE_OTHER): Payer: Medicare HMO | Admitting: Physician Assistant

## 2020-04-22 ENCOUNTER — Telehealth: Payer: Self-pay | Admitting: Family Medicine

## 2020-04-22 ENCOUNTER — Other Ambulatory Visit: Payer: Self-pay

## 2020-04-22 VITALS — BP 126/81 | HR 100 | Temp 98.3°F

## 2020-04-22 DIAGNOSIS — L89309 Pressure ulcer of unspecified buttock, unspecified stage: Secondary | ICD-10-CM | POA: Diagnosis not present

## 2020-04-22 DIAGNOSIS — B356 Tinea cruris: Secondary | ICD-10-CM | POA: Diagnosis not present

## 2020-04-22 DIAGNOSIS — F329 Major depressive disorder, single episode, unspecified: Secondary | ICD-10-CM | POA: Diagnosis not present

## 2020-04-22 DIAGNOSIS — N390 Urinary tract infection, site not specified: Secondary | ICD-10-CM | POA: Insufficient documentation

## 2020-04-22 DIAGNOSIS — T83511D Infection and inflammatory reaction due to indwelling urethral catheter, subsequent encounter: Secondary | ICD-10-CM

## 2020-04-22 DIAGNOSIS — T83518A Infection and inflammatory reaction due to other urinary catheter, initial encounter: Secondary | ICD-10-CM | POA: Insufficient documentation

## 2020-04-22 DIAGNOSIS — F32A Depression, unspecified: Secondary | ICD-10-CM

## 2020-04-22 DIAGNOSIS — R69 Illness, unspecified: Secondary | ICD-10-CM | POA: Diagnosis not present

## 2020-04-22 MED ORDER — CLOTRIMAZOLE 1 % EX CREA: 1.0000 "application " | TOPICAL_CREAM | Freq: Two times a day (BID) | CUTANEOUS | 0 refills | Status: DC

## 2020-04-22 MED ORDER — SILVER SULFADIAZINE 1 % EX CREA: 1.0000 "application " | TOPICAL_CREAM | Freq: Two times a day (BID) | CUTANEOUS | 0 refills | Status: DC

## 2020-04-22 NOTE — Telephone Encounter (Signed)
I do not think it is an infection requiring an antibiotic. He is free to see his PCP for a separate evaluation.

## 2020-04-22 NOTE — Assessment & Plan Note (Signed)
Decrease Paxil to 15 MG Daily  Follow up with pcp

## 2020-04-22 NOTE — Progress Notes (Signed)
Established patient visit   Patient: Justin [name]   DOB: 02/13/1981   39 y.o. Male  MRN: [id number] Visit Date: 04/22/2020  Today's healthcare provider: Trey Sailors, PA-C   Chief Complaint  Patient presents with  . Spasms   Subjective    HPI   Patient presents today for an office visit to discuss muscle spasms and skin inflammation of the buttock.  Patient says that he has spasms in his left leg and this happens when he has an infection. He would like an antibiotic.   Patient is breaking out in groin area and buttocks area. He says that the skin in the area is flaky. Patient says that he is using powder to keep his skin dry in the area.   Patient would like to discuss lowering paxil medication. He does not feel he has depression anymore since getting married and would like to come off this.      Medications: Outpatient Medications Prior to Visit  Medication Sig  . albuterol (PROVENTIL HFA;VENTOLIN HFA) 108 (90 Base) MCG/ACT inhaler Inhale 2 puffs into the lungs every 6 (six) hours as needed for wheezing or shortness of breath.  . alprazolam (XANAX) 2 MG tablet TAKE ONE TABLET EVERY SIX HOURS IF NEEDED  . amphetamine-dextroamphetamine (ADDERALL) 15 MG tablet TAKE ONE TABLET TWICE DAILY  . baclofen (LIORESAL) 20 MG tablet TAKE ONE TABLET BY MOUTH FOUR TIMES DAILY  . clotrimazole-betamethasone (LOTRISONE) cream Apply 1 application topically 2 (two) times daily.  . diphenhydrAMINE (BENADRYL) 25 MG tablet Take 1 tablet by mouth as needed.  . docusate sodium (COLACE) 250 MG capsule Take 1 capsule (250 mg total) by mouth 4 (four) times daily as needed.  . doxycycline (VIBRA-TABS) 100 MG tablet Take 1 tablet (100 mg total) by mouth 2 (two) times daily.  Marland Kitchen dronabinol (MARINOL) 10 MG capsule Take 1 capsule (10 mg total) by mouth every 6 (six) hours as needed.  . flavoxATE (URISPAS) 100 MG tablet Take 1 tablet (100 mg total) by mouth 3 (three) times daily as needed  for bladder spasms.  . fluticasone (FLONASE) 50 MCG/ACT nasal spray Place 2 sprays into both nostrils daily.  Marland Kitchen gabapentin (NEURONTIN) 800 MG tablet Take 1 tablet (800 mg total) by mouth 4 (four) times daily.  Marland Kitchen levothyroxine (SYNTHROID) 75 MCG tablet TAKE ONE TABLET ON AN EMPTY STOMACH WITHA GLASS OF WATER AT LEAST 30 TO 60 MINUTES BEFORE BREAKFAST  . lidocaine (LMX) 4 % cream Apply 1 application topically 3 (three) times daily.  Marland Kitchen LINZESS 145 MCG CAPS capsule TAKE 1 CAPSULE BY MOUTH DAILY BEFORE BREAKFAST.  . methadone (DOLOPHINE) 10 MG tablet TAKE TWO TABLETS EVERY 6 HOURS  . Mineral Oil OIL daily.  . naproxen (NAPROSYN) 500 MG tablet TAKE ONE TABLET TWICE DAILY  . omeprazole (PRILOSEC) 20 MG capsule TAKE 1 CAPSULE BY MOUTH EVERY DAY  . oxyCODONE (ROXICODONE) 15 MG immediate release tablet TAKE ONE TABLET BY MOUTH EVERY 6 HOURS AS NEEDED FOR PAIN  . PARoxetine (PAXIL) 30 MG tablet TAKE 2 TABLETS BY MOUTH DAILY  . phenazopyridine (PYRIDIUM) 200 MG tablet Take 1 tablet (200 mg total) by mouth 3 (three) times daily.  . polyethylene glycol powder (QC NATURA-LAX) 17 GM/SCOOP powder MIX 1 CAPFUL IN 8 0Z OF WATER OR JUICE AND DRINK ONCE A DAY  . promethazine (PHENERGAN) 25 MG suppository Place 1 suppository (25 mg total) rectally every 6 (six) hours as needed for nausea or vomiting.  . promethazine (  PHENERGAN) 25 MG tablet TAKE 1 TABLET BY MOUTH EVERY 4 HOURS AS NEEDED FOR NAUSEA/VOMITING  . sildenafil (REVATIO) 20 MG tablet Take 3 to 5 tablets two hours before intercouse on an empty stomach.  Do not take with nitrates.  Marland Kitchen tiZANidine (ZANAFLEX) 4 MG tablet TAKE TWO TABLETS FOUR TIMES DAILY  . TOVIAZ 8 MG TB24 tablet TAKE 1 TABLET BY MOUTH DAILY  . VESICARE 10 MG tablet TAKE 1 TABLET BY MOUTH DAILY  . [DISCONTINUED] silver sulfADIAZINE (SILVADENE) 1 % cream Apply 1 application topically 2 (two) times daily.   Facility-Administered Medications Prior to Visit  Medication Dose Route Frequency Provider   . methylPREDNISolone acetate (DEPO-MEDROL) injection 40 mg  40 mg Intramuscular Once Malva Limes, MD    Review of Systems    Objective    BP 126/81 (BP Location: Left Arm, Patient Position: Sitting, Cuff Size: Large)   Pulse 100   Temp 98.3 F (36.8 C) (Temporal)    Physical Exam Constitutional:      Appearance: Normal appearance.  Cardiovascular:     Rate and Rhythm: Regular rhythm.     Heart sounds: Normal heart sounds.  Pulmonary:     Breath sounds: Normal breath sounds.  Musculoskeletal:       Legs:  Skin:    General: Skin is warm and dry.  Neurological:     Mental Status: He is alert. Mental status is at baseline.  Psychiatric:        Mood and Affect: Mood normal.        Behavior: Behavior normal.       No results found for any visits on 04/22/20.  Assessment & Plan    1. Pressure injury of skin of buttock, unspecified injury stage, unspecified laterality  Rash on buttock consistent with pressure injury. Counseled on offloading pressure and changing positions. Anti-fungal given for groin rash which patient does not demonstrate today.  - clotrimazole (LOTRIMIN) 1 % cream; Apply 1 application topically 2 (two) times daily.  Dispense: 30 g; Refill: 0  2. Depression, unspecified depression type  Can taper off Paxil. He has already taken it less often. May take Paxil 30 mg daily for 2 weeks. May take half tablet daily for two weeks and stop  3. Tinea cruris  - silver sulfADIAZINE (SILVADENE) 1 % cream; Apply 1 application topically 2 (two) times daily.  Dispense: 50 g; Refill: 0  4. Urinary tract infection associated with catheterization of urinary tract, unspecified indwelling urinary catheter type, subsequent encounter  Patient states he has badly smelling urine. States he is unable to give a urine sample in the office and needs to go out to the car to do so. He never returns with a sample.   Return if symptoms worsen or fail to improve.      ITrey Sailors, PA-C, have reviewed all documentation for this visit. The documentation on 05/17/20 for the exam, diagnosis, procedures, and orders are all accurate and complete.  The entirety of the information documented in the History of Present Illness, Review of Systems and Physical Exam were personally obtained by me. Portions of this information were initially documented by Angelica Ran, CMA and reviewed by me for thoroughness and accuracy.     Maryella Shivers  [facility] [phone] (phone) [phone] (fax)  [facility]

## 2020-04-22 NOTE — Telephone Encounter (Signed)
Patient advised as below. Patient states "I know my body, and it reacts differently when I have an infection". Patient says he has spasms whenever there is an infection. Patient expressed that he would like a short course of antibiotics to try. He says that if his spasms go away after taking the antibiotics we will know that he has an infection. Patient agrees to see Dr. [name] for a separate evaluation. Appointment has been made for Tuesday 04/26/2020 at 1:40pm.

## 2020-04-22 NOTE — Progress Notes (Deleted)
Established patient visit   Patient: Justin [name]   DOB: 01/13/1981   39 y.o. Male  MRN: [id number] Visit Date: 04/22/2020  Today's healthcare provider: Trey Sailors, PA-C   No chief complaint on file.  Subjective    HPI  ***  {Show patient history (optional):23778::" "}   Medications: Outpatient Medications Prior to Visit  Medication Sig  . albuterol (PROVENTIL HFA;VENTOLIN HFA) 108 (90 Base) MCG/ACT inhaler Inhale 2 puffs into the lungs every 6 (six) hours as needed for wheezing or shortness of breath.  . alprazolam (XANAX) 2 MG tablet TAKE ONE TABLET EVERY SIX HOURS IF NEEDED  . amphetamine-dextroamphetamine (ADDERALL) 15 MG tablet TAKE ONE TABLET TWICE DAILY  . baclofen (LIORESAL) 20 MG tablet TAKE ONE TABLET BY MOUTH FOUR TIMES DAILY  . clotrimazole-betamethasone (LOTRISONE) cream Apply 1 application topically 2 (two) times daily.  . diphenhydrAMINE (BENADRYL) 25 MG tablet Take 1 tablet by mouth as needed.  . docusate sodium (COLACE) 250 MG capsule Take 1 capsule (250 mg total) by mouth 4 (four) times daily as needed.  . doxycycline (VIBRA-TABS) 100 MG tablet Take 1 tablet (100 mg total) by mouth 2 (two) times daily.  Marland Kitchen dronabinol (MARINOL) 10 MG capsule Take 1 capsule (10 mg total) by mouth every 6 (six) hours as needed.  . flavoxATE (URISPAS) 100 MG tablet Take 1 tablet (100 mg total) by mouth 3 (three) times daily as needed for bladder spasms.  . fluticasone (FLONASE) 50 MCG/ACT nasal spray Place 2 sprays into both nostrils daily.  Marland Kitchen gabapentin (NEURONTIN) 800 MG tablet Take 1 tablet (800 mg total) by mouth 4 (four) times daily.  Marland Kitchen levothyroxine (SYNTHROID) 75 MCG tablet TAKE ONE TABLET ON AN EMPTY STOMACH WITHA GLASS OF WATER AT LEAST 30 TO 60 MINUTES BEFORE BREAKFAST  . lidocaine (LMX) 4 % cream Apply 1 application topically 3 (three) times daily.  Marland Kitchen LINZESS 145 MCG CAPS capsule TAKE 1 CAPSULE BY MOUTH DAILY BEFORE BREAKFAST.  . methadone  (DOLOPHINE) 10 MG tablet TAKE TWO TABLETS EVERY 6 HOURS  . Mineral Oil OIL daily.  . naproxen (NAPROSYN) 500 MG tablet TAKE ONE TABLET TWICE DAILY  . omeprazole (PRILOSEC) 20 MG capsule TAKE 1 CAPSULE BY MOUTH EVERY DAY  . oxyCODONE (ROXICODONE) 15 MG immediate release tablet TAKE ONE TABLET BY MOUTH EVERY 6 HOURS AS NEEDED FOR PAIN  . PARoxetine (PAXIL) 30 MG tablet TAKE 2 TABLETS BY MOUTH DAILY  . phenazopyridine (PYRIDIUM) 200 MG tablet Take 1 tablet (200 mg total) by mouth 3 (three) times daily.  . polyethylene glycol powder (QC NATURA-LAX) 17 GM/SCOOP powder MIX 1 CAPFUL IN 8 0Z OF WATER OR JUICE AND DRINK ONCE A DAY  . promethazine (PHENERGAN) 25 MG suppository Place 1 suppository (25 mg total) rectally every 6 (six) hours as needed for nausea or vomiting.  . promethazine (PHENERGAN) 25 MG tablet TAKE 1 TABLET BY MOUTH EVERY 4 HOURS AS NEEDED FOR NAUSEA/VOMITING  . sildenafil (REVATIO) 20 MG tablet Take 3 to 5 tablets two hours before intercouse on an empty stomach.  Do not take with nitrates.  . silver sulfADIAZINE (SILVADENE) 1 % cream Apply 1 application topically 2 (two) times daily.  Marland Kitchen tiZANidine (ZANAFLEX) 4 MG tablet TAKE TWO TABLETS FOUR TIMES DAILY  . TOVIAZ 8 MG TB24 tablet TAKE 1 TABLET BY MOUTH DAILY  . VESICARE 10 MG tablet TAKE 1 TABLET BY MOUTH DAILY   Facility-Administered Medications Prior to Visit  Medication Dose Route Frequency  Provider  . methylPREDNISolone acetate (DEPO-MEDROL) injection 40 mg  40 mg Intramuscular Once Malva Limes, MD    Review of Systems  {Heme  Chem  Endocrine  Serology  Results Review (optional):23779::" "}  Objective    There were no vitals taken for this visit. {Show previous vital signs (optional):23777::" "}  Physical Exam  ***  No results found for any visits on 04/22/20.  Assessment & Plan     ***  No follow-ups on file.      {provider attestation***:1}   Maryella Shivers  [facility] [phone] (phone) [phone] (fax)  [facility]

## 2020-04-22 NOTE — Telephone Encounter (Signed)
Pt called expressing that he did not agree with Osvaldo Angst diagnosis of a pressure sore on his buttocks from this morning's visit with her. Pt thinks it's an infection on his skin and needing an antibiotic to help clear it up.  He also has muscle spasms he thinks are due to the infection on his buttocks and very painful.  He doesn't want to go through the weekend with the pain when he could have received an antibiotic to help it to clear up. He is requesting a call back to let him know if an antibiotic has been called in for him.  Pt can be reach at his new cell phone:  [phone].  Thanks, [employer]

## 2020-04-22 NOTE — Telephone Encounter (Signed)
Please advise 

## 2020-04-26 ENCOUNTER — Ambulatory Visit: Payer: Self-pay | Admitting: Family Medicine

## 2020-04-26 DIAGNOSIS — R339 Retention of urine, unspecified: Secondary | ICD-10-CM | POA: Diagnosis not present

## 2020-04-28 ENCOUNTER — Ambulatory Visit: Payer: Medicare HMO | Admitting: Urology

## 2020-05-02 ENCOUNTER — Ambulatory Visit: Payer: Medicare HMO | Admitting: Urology

## 2020-05-09 ENCOUNTER — Ambulatory Visit: Payer: Medicare HMO | Admitting: Family Medicine

## 2020-05-09 ENCOUNTER — Other Ambulatory Visit: Payer: Self-pay | Admitting: Family Medicine

## 2020-05-09 DIAGNOSIS — F988 Other specified behavioral and emotional disorders with onset usually occurring in childhood and adolescence: Secondary | ICD-10-CM

## 2020-05-09 DIAGNOSIS — G894 Chronic pain syndrome: Secondary | ICD-10-CM

## 2020-05-09 NOTE — Telephone Encounter (Signed)
Requested medication (s) are due for refill today: no  Requested medication (s) are on the active medication list: yes  Last refill: 04/12/2020  Future visit scheduled: no  Notes to clinic:  this refill cannot be delegated    Requested Prescriptions  Pending Prescriptions Disp Refills   methadone (DOLOPHINE) 10 MG tablet [Pharmacy Med Name: METHADONE HCL 10 MG TAB] 240 tablet     Sig: TAKE TWO TABLETS EVERY 6 HOURS      Not Delegated - Analgesics:  Opioid Agonists Failed - 05/09/2020  1:43 PM      Failed - This refill cannot be delegated      Failed - Urine Drug Screen completed in last 360 days.      Passed - Valid encounter within last 6 months    Recent Outpatient Visits           2 weeks ago Pressure injury of skin of buttock, unspecified injury stage, unspecified laterality   [facility] [place], Adriana M, PA-C   3 months ago Urinary tract infection associated with catheterization of urinary tract, unspecified indwelling urinary catheter type, subsequent encounter   [facility] [place], Jodell [place], [place]   5 months ago Recurrent UTI   [facility] Chrismon, [place], [place]   5 months ago Recurrent UTI   [facility], [place], [place]   6 months ago Recurrent UTI   [facility], J[place], [place]       Future Appointments             In 1 week Richardo Hanks, Laurette Schimke, MD [facility]              oxyCODONE (ROXICODONE) 15 MG immediate release tablet [Pharmacy Med Name: OXYCODONE HCL 15 MG TAB] 120 tablet     Sig: TAKE ONE TABLET BY MOUTH EVERY 6 HOURS AS NEEDED FOR PAIN      Not Delegated - Analgesics:  Opioid Agonists Failed - 05/09/2020  1:43 PM      Failed - This refill cannot be delegated      Failed - Urine Drug Screen completed in last 360 days.      Passed - Valid encounter within last 6 months    Recent Outpatient Visits           2 weeks ago Pressure injury of  skin of buttock, unspecified injury stage, unspecified laterality   [facility] [place], Adriana M, PA-C   3 months ago Urinary tract infection associated with catheterization of urinary tract, unspecified indwelling urinary catheter type, subsequent encounter   [facility] Chrismon, Jodell [place], [place]   5 months ago Recurrent UTI   [facility] [place]ism[place], [place], [place]   5 months ago Recurrent UTI   [facility], [place], [place]   6 months ago Recurrent UTI   [facility], [place], [place]       Future Appointments             In 1 week Richardo Hanks, Laurette Schimke, MD [facility]              amphetamine-dextroamphetamine (ADDERALL) 15 MG tablet [Pharmacy Med Name: AMPHETAMINE-DEXTROAMPHETAMINE 15 MG] 60 tablet     Sig: TAKE ONE TABLET TWICE DAILY      Not Delegated - Psychiatry:  Stimulants/ADHD Failed - 05/09/2020  1:43 PM      Failed - This refill cannot be delegated  Failed - Urine Drug Screen completed in last 360 days.      Passed - Valid encounter within last 3 months    Recent Outpatient Visits           2 weeks ago Pressure injury of skin of buttock, unspecified injury stage, unspecified laterality   [facility] [place], Adriana M, PA-C   3 months ago Urinary tract infection associated with catheterization of urinary tract, unspecified indwelling urinary catheter type, subsequent encounter   [facility] [place], Jodell [place], [place]   5 months ago Recurrent UTI   [facility] [place], [place], [place]   5 months ago Recurrent UTI   [facility], [place], [place]   6 months ago Recurrent UTI   [facility], [place], [place]       Future Appointments             In 1 week Richardo Hanks, Laurette Schimke, MD [facility]

## 2020-05-18 ENCOUNTER — Ambulatory Visit: Payer: Medicare HMO | Admitting: Urology

## 2020-05-24 ENCOUNTER — Other Ambulatory Visit
Admission: RE | Admit: 2020-05-24 | Discharge: 2020-05-24 | Disposition: A | Discharge status: Home / Self Care | Payer: Medicare HMO | Source: Ambulatory Visit | Attending: Urology | Admitting: Urology

## 2020-05-24 ENCOUNTER — Encounter: Payer: Self-pay | Admitting: Urology

## 2020-05-24 ENCOUNTER — Other Ambulatory Visit: Payer: Self-pay

## 2020-05-24 ENCOUNTER — Ambulatory Visit (INDEPENDENT_AMBULATORY_CARE_PROVIDER_SITE_OTHER): Payer: Medicare HMO | Admitting: Urology

## 2020-05-24 VITALS — BP 118/63 | HR 96

## 2020-05-24 DIAGNOSIS — N319 Neuromuscular dysfunction of bladder, unspecified: Secondary | ICD-10-CM | POA: Diagnosis not present

## 2020-05-24 DIAGNOSIS — N390 Urinary tract infection, site not specified: Secondary | ICD-10-CM | POA: Diagnosis not present

## 2020-05-24 NOTE — Progress Notes (Signed)
   05/24/2020 2:16 PM   [name] [birth year] [id number]  Reason for visit: Neurogenic bladder, recurrent UTIs  HPI: I saw Mr. Bogus and his wife in urology clinic for the above issues.  He was last seen in our clinic by Michiel Cowboy in November 2018 for similar issues.  He has a history of a partial spinal cord injury as a teenager, with resulting neurogenic bladder that has been managed with clean intermittent catheterization 4-6 times per day for the last 20 years.  He was previously followed by [facility], but has not been seen by them since 2016.  He has been on an anticholinergic long-term.  Last urodynamics in October 2016 at [facility] showed for sensation at 155 mL, first urge at 246 mL, strong urge at 425 mL, and capacity for 25 mL.  Calculated compliance was 137 mL/cmH2O, stress incontinence was demonstrated on the study and VLPP was 113, at capacity stress urinary incontinence was demonstrated Valsalva pressures of 113 and 159 cm of water.  There was no evidence of diverticulum or reflux.  His primary complaint today is recurrent UTIs with 3 UTIs over the last 5 to 6 months, with symptoms of dysuria, worsening incontinence, spasms, and foul smell with infections.  He also complains of stress incontinence when he gets in and out of his chair or increases his abdominal pressure with movement.  He denies significant urge incontinence.  He previously has tried a Cunningham clamp, but he feels this does not stay on his phallus and is not effective in preventing his stress incontinence.  He is also concerned that his catheters he is using for CIC may be contributing to his UTIs.  I personally reviewed his CT from January 2021 that shows no hydronephrosis, nephrolithiasis, or other urologic pathology.  Urinalysis today is pending, call with results.  We had a very long conversation about his chronic neurogenic bladder, recurrent UTIs, and incontinence.  We discussed UTI prevention  strategies including cranberry tablets daily, changing to a different catheter company, adequate hydration, timely CIC preventing over distention, and clean technique with CIC.  We discussed the differences between urge and stress incontinence and neurogenic bladder patient's, different strategies for managing this.  He was previously told by different urologist that " they could tighten his sphincter and prevent his stress incontinence."  I discussed different strategies including an artificial sphincter or male sling, but that I do not perform either these procedures.  We also discussed Botox for patients with urge incontinence.  I recommended re-establishing care with [facility] with his complex urologic history and neurogenic bladder, and repeating urodynamics prior to determining any treatment strategies for his stress incontinence between catheterizations.  I spent 45 total minutes on the day of the encounter including pre-visit review of the medical record, face-to-face time with the patient, and post visit ordering of labs/imaging/tests.  Sondra Come, MD  [facility] [address] [place], [place] [address] [phone]

## 2020-05-26 DIAGNOSIS — R339 Retention of urine, unspecified: Secondary | ICD-10-CM | POA: Diagnosis not present

## 2020-05-26 LAB — URINE CULTURE: Culture: 40000 — AB

## 2020-05-27 ENCOUNTER — Telehealth: Payer: Self-pay

## 2020-05-27 NOTE — Telephone Encounter (Signed)
Called pt no answer. Unable to leave message per DPR. Mychart message sent.

## 2020-05-27 NOTE — Telephone Encounter (Signed)
-----   Message from Sondra Come, MD sent at 05/27/2020 11:07 AM EDT ----- There is small amount of bacteria in the urine, normal with CIC, would not recommend antibiotics unless symptoms of burning, pelvic pain, or fever  If he is having the symptoms and requests treatment, can do 3 days of Bactrim DS twice daily  Thanks Legrand Rams, MD 05/27/2020

## 2020-06-02 ENCOUNTER — Telehealth: Payer: Self-pay | Admitting: Urology

## 2020-06-02 DIAGNOSIS — N390 Urinary tract infection, site not specified: Secondary | ICD-10-CM

## 2020-06-02 MED ORDER — SULFAMETHOXAZOLE-TRIMETHOPRIM 800-160 MG PO TABS: 1.0000 | ORAL_TABLET | Freq: Two times a day (BID) | ORAL | 0 refills | Status: AC

## 2020-06-02 NOTE — Telephone Encounter (Signed)
Pt left vm to check on results to see if he has a bladder infection

## 2020-06-02 NOTE — Telephone Encounter (Signed)
Returned patient's called he states that he believes he is in the beginning stage of an infection. Denies pain, fever, or chills but does note increase in foul smelling urine, will send bactrim ds per result note from Dr. Richardo Hanks. Patient gave verbal understanding.

## 2020-06-07 ENCOUNTER — Other Ambulatory Visit: Payer: Self-pay | Admitting: Family Medicine

## 2020-06-07 DIAGNOSIS — F988 Other specified behavioral and emotional disorders with onset usually occurring in childhood and adolescence: Secondary | ICD-10-CM

## 2020-06-07 DIAGNOSIS — G894 Chronic pain syndrome: Secondary | ICD-10-CM

## 2020-06-07 NOTE — Telephone Encounter (Signed)
Requested medications are due for refill today?  Yes - These medication refills cannot be delegated.    Requested medications are on active medication list?  Yes  Last Refill:    Methadone  05/10/2020  # 240 with no refills  Amphetamine-Dextroamphetamine  (Adderall)      05/10/2020  # 60 with no refills  Oxycodone  05/10/2020  # 120 with no refills   Future visit scheduled?  No   Notes to Clinic:  These medication refills cannot be delegated.

## 2020-06-21 ENCOUNTER — Other Ambulatory Visit: Payer: Self-pay | Admitting: Family Medicine
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
## 2020-06-24 DIAGNOSIS — R339 Retention of urine, unspecified: Secondary | ICD-10-CM | POA: Diagnosis not present

## 2020-07-11 ENCOUNTER — Other Ambulatory Visit: Payer: Self-pay | Admitting: Family Medicine

## 2020-07-11 DIAGNOSIS — F988 Other specified behavioral and emotional disorders with onset usually occurring in childhood and adolescence: Secondary | ICD-10-CM

## 2020-07-11 DIAGNOSIS — G894 Chronic pain syndrome: Secondary | ICD-10-CM

## 2020-07-11 NOTE — Telephone Encounter (Signed)
Requested medication (s) are due for refill today: Adderall, yes   Requested medication (s) are on the active medication list: yes  Last refill: 06/09/20  Future visit scheduled: no  Notes to clinic:  not delegated  Requested medication (s) are due for refill today: Methadone, yes   Requested medication (s) are on the active medication list: yes  Last refill: 06/09/20  Future visit scheduled: no  Notes to clinic:  not delegated  Requested medication (s) are due for refill today: Oxycodone, yes   Requested medication (s) are on the active medication list: yes  Last refill: 06/09/20  Future visit scheduled: no  Notes to clinic:  not delegated   Requested Prescriptions  Pending Prescriptions Disp Refills   amphetamine-dextroamphetamine (ADDERALL) 15 MG tablet [Pharmacy Med Name: AMPHETAMINE-DEXTROAMPHETAMINE 15 MG] 60 tablet     Sig: TAKE ONE TABLET TWICE DAILY      Not Delegated - Psychiatry:  Stimulants/ADHD Failed - 07/11/2020  2:07 PM      Failed - This refill cannot be delegated      Failed - Urine Drug Screen completed in last 360 days.      Passed - Valid encounter within last 3 months    Recent Outpatient Visits           2 months ago Pressure injury of skin of buttock, unspecified injury stage, unspecified laterality   [facility], Adriana M, PA-C   5 months ago Urinary tract infection associated with catheterization of urinary tract, unspecified indwelling urinary catheter type, subsequent encounter   [facility] Chrismon, Jodell Cipro, [place]   7 months ago Recurrent UTI   [facility] Chrismon, [place], [place]   7 months ago Recurrent UTI   [facility], [place], [place]   8 months ago Recurrent UTI   [facility], [place], [place]                methadone (DOLOPHINE) 10 MG tablet [Pharmacy Med Name: METHADONE HCL 10 MG TAB] 240 tablet     Sig: TAKE TWO TABLETS EVERY 6  HOURS      Not Delegated - Analgesics:  Opioid Agonists Failed - 07/11/2020  2:07 PM      Failed - This refill cannot be delegated      Failed - Urine Drug Screen completed in last 360 days.      Passed - Valid encounter within last 6 months    Recent Outpatient Visits           2 months ago Pressure injury of skin of buttock, unspecified injury stage, unspecified laterality   [facility] [place], Adriana M, PA-C   5 months ago Urinary tract infection associated with catheterization of urinary tract, unspecified indwelling urinary catheter type, subsequent encounter   [facility] Chrismon, Jodell Cipro, [place]   7 months ago Recurrent UTI   [facility] Chrismon, [place], [place]   7 months ago Recurrent UTI   [facility], [place], [place]   8 months ago Recurrent UTI   [facility], [place], [place]                oxyCODONE (ROXICODONE) 15 MG immediate release tablet [Pharmacy Med Name: OXYCODONE HCL 15 MG TAB] 120 tablet     Sig: TAKE ONE TABLET BY MOUTH EVERY 6 HOURS AS NEEDED FOR PAIN      Not Delegated - Analgesics:  Opioid Agonists Failed - 07/11/2020  2:07 PM      Failed - This refill cannot be delegated      Failed - Urine Drug Screen completed in last 360 days.      Passed - Valid encounter within last 6 months    Recent Outpatient Visits           2 months ago Pressure injury of skin of buttock, unspecified injury stage, unspecified laterality   [facility] Osvaldo [place] M, PA-C   5 months ago Urinary tract infection associated with catheterization of urinary tract, unspecified indwelling urinary catheter type, subsequent encounter   [facility] Chrismon, Jodell Cipro, [place]   7 months ago Recurrent UTI   [facility] Chrismon, [place], [place]   7 months ago Recurrent UTI   [facility], [place], [place]   8 months ago Recurrent UTI    [facility] Chrismon, Jodell Cipro, [place]

## 2020-07-11 NOTE — Telephone Encounter (Signed)
Requested medication (s) are due for refill today: yes  Requested medication (s) are on the active medication list: yes  Last refill:  alprazolam 03/12/20 #120 3 refills,  Phenergan 06/21/20  #30 0 refills  Future visit scheduled:No  Notes to clinic:  Medications are not delegated    Requested Prescriptions  Pending Prescriptions Disp Refills   alprazolam (XANAX) 2 MG tablet [Pharmacy Med Name: ALPRAZOLAM 2 MG TAB] 120 tablet     Sig: TAKE ONE TABLET EVERY SIX HOURS IF NEEDED      Not Delegated - Psychiatry:  Anxiolytics/Hypnotics Failed - 07/11/2020  2:00 PM      Failed - This refill cannot be delegated      Failed - Urine Drug Screen completed in last 360 days.      Passed - Valid encounter within last 6 months    Recent Outpatient Visits           2 months ago Pressure injury of skin of buttock, unspecified injury stage, unspecified laterality   [facility] [place], Adriana M, PA-C   5 months ago Urinary tract infection associated with catheterization of urinary tract, unspecified indwelling urinary catheter type, subsequent encounter   [facility] Chrismon, Jodell Cipro, [place]   7 months ago Recurrent UTI   [facility], [place], [place]   7 months ago Recurrent UTI   [facility], [place], [place]   8 months ago Recurrent UTI   [facility], [place], [place]                promethazine (PHENERGAN) 25 MG tablet [Pharmacy Med Name: PROMETHAZINE HCL 25 MG TAB] 30 tablet 0    Sig: TAKE 1 TABLET BY MOUTH EVERY 4 HOURS AS NEEDED FOR NAUSEA/VOMITING      Not Delegated - Gastroenterology: Antiemetics Failed - 07/11/2020  2:00 PM      Failed - This refill cannot be delegated      Passed - Valid encounter within last 6 months    Recent Outpatient Visits           2 months ago Pressure injury of skin of buttock, unspecified injury stage, unspecified laterality   [facility]  [place], Adriana M, PA-C   5 months ago Urinary tract infection associated with catheterization of urinary tract, unspecified indwelling urinary catheter type, subsequent encounter   [facility] Chrismon, Jodell Cipro, [place]   7 months ago Recurrent UTI   [facility], [place], [place]   7 months ago Recurrent UTI   [facility], [place], [place]   8 months ago Recurrent UTI   [facility], [place], [place]

## 2020-07-14 ENCOUNTER — Encounter: Payer: Self-pay | Admitting: Family Medicine

## 2020-07-19 NOTE — Telephone Encounter (Signed)
Copied from CRM [id number]. Topic: General - Other >> Jul 14, 2020  3:49 PM Dalphine Handing A wrote: Patient stated that he was advised by nurse to call and let office know when he is coming to drop off a urine sample. Patient stated that he will be in tomorrow.

## 2020-07-19 NOTE — Telephone Encounter (Signed)
Please see mychart message from 07/14/20.

## 2020-07-27 DIAGNOSIS — R339 Retention of urine, unspecified: Secondary | ICD-10-CM | POA: Diagnosis not present

## 2020-07-29 ENCOUNTER — Ambulatory Visit: Payer: Self-pay | Admitting: *Deleted

## 2020-07-29 NOTE — Telephone Encounter (Signed)
Pt called with complaints of leg spasms and feeling like he has a UTI; the pt says leg spasms started 0930 07/29/20; he has burning in is bladder and urinary urgency and pain since PM 07/28/20; the pt says his spinal cord injury is not complete and he does have sensations; he has spasms because his autonomic nervous system was damaged; the pt self-catheterizes 6 times daily; recommendations made per nurse triage protocol; contacted Porsha at the office, and she states per Dr [name] the pt should contact his urologist; pt given information and verbalized understanding; the pt  he would like to know if he can drop off a urine sample and a prescription for Keflex be called in if he can not reach his urologist; can be contacted at [phone]; will route to office for final disposition.  Reason for Disposition  Patient sounds very sick or weak to the triager  Answer Assessment - Initial Assessment Questions 1. SYMPTOM: "What's the main symptom you're concerned about?" (e.g., frequency, incontinence)     Urinary frequency and burning 2. ONSET: "When did the  start?"     07/28/20 3. PAIN: "Is there any pain?" If Yes, ask: "How bad is it?" (Scale: 1-10; mild, moderate, severe)     4 out of 5 4. CAUSE: "What do you think is causing the symptoms?"  ? UTI 5. OTHER SYMPTOMS: "Do you have any other symptoms?" (e.g., fever, flank pain, blood in urine, pain with urination)     Burning with urination, leg spasm, "rectal area burns" 6. PREGNANCY: "Is there any chance you are pregnant?" "When was your last menstrual period?"     n/a  Protocols used: URINATION PAIN - MALE-A-AH, URINARY SYMPTOMS-A-AH

## 2020-08-01 ENCOUNTER — Telehealth: Payer: Self-pay | Admitting: Urology

## 2020-08-01 ENCOUNTER — Other Ambulatory Visit: Payer: Self-pay

## 2020-08-01 ENCOUNTER — Encounter: Payer: Self-pay | Admitting: Family Medicine

## 2020-08-01 ENCOUNTER — Ambulatory Visit (INDEPENDENT_AMBULATORY_CARE_PROVIDER_SITE_OTHER): Payer: Medicare HMO | Admitting: Family Medicine

## 2020-08-01 VITALS — BP 125/91 | HR 93 | Temp 98.3°F

## 2020-08-01 DIAGNOSIS — N319 Neuromuscular dysfunction of bladder, unspecified: Secondary | ICD-10-CM | POA: Diagnosis not present

## 2020-08-01 DIAGNOSIS — R300 Dysuria: Secondary | ICD-10-CM

## 2020-08-01 DIAGNOSIS — L89309 Pressure ulcer of unspecified buttock, unspecified stage: Secondary | ICD-10-CM | POA: Diagnosis not present

## 2020-08-01 DIAGNOSIS — G8220 Paraplegia, unspecified: Secondary | ICD-10-CM

## 2020-08-01 DIAGNOSIS — B356 Tinea cruris: Secondary | ICD-10-CM | POA: Diagnosis not present

## 2020-08-01 LAB — POCT URINALYSIS DIPSTICK
Bilirubin, UA: NEGATIVE
Blood, UA: NEGATIVE
Glucose, UA: NEGATIVE
Ketones, UA: NEGATIVE
Leukocytes, UA: NEGATIVE
Nitrite, UA: NEGATIVE
Protein, UA: NEGATIVE
Spec Grav, UA: 1.025 (ref 1.010–1.025)
Urobilinogen, UA: 0.2 E.U./dL
pH, UA: 6 (ref 5.0–8.0)

## 2020-08-01 MED ORDER — CLOTRIMAZOLE 1 % EX CREA: 1.0000 "application " | TOPICAL_CREAM | Freq: Two times a day (BID) | CUTANEOUS | 0 refills | Status: DC

## 2020-08-01 MED ORDER — SILVER SULFADIAZINE 1 % EX CREA: 1.0000 "application " | TOPICAL_CREAM | Freq: Two times a day (BID) | CUTANEOUS | 0 refills | Status: DC

## 2020-08-01 NOTE — Telephone Encounter (Signed)
Pt left message on v/m that he thinks he has a bladder infection and [facility] told him to call and he would send in abx.  He is in a wheel chair with a spinal cord injury.
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
## 2020-08-02 NOTE — Telephone Encounter (Signed)
UA with PCP yesterday was completely normal, and culture pending. Would await culture prior to abx. Also would recommend re-establishing care with [facility] as I said in my last note, thanks  Legrand Rams, MD 08/02/2020

## 2020-08-02 NOTE — Telephone Encounter (Signed)
Message sent to pt via mychart as that is his preferred communication.

## 2020-08-03 LAB — URINE CULTURE

## 2020-08-04 ENCOUNTER — Telehealth: Payer: Self-pay

## 2020-08-04 ENCOUNTER — Other Ambulatory Visit: Payer: Self-pay | Admitting: Family Medicine

## 2020-08-04 DIAGNOSIS — R300 Dysuria: Secondary | ICD-10-CM

## 2020-08-04 MED ORDER — CEFDINIR 300 MG PO CAPS: 300.0000 mg | ORAL_CAPSULE | Freq: Two times a day (BID) | ORAL | 0 refills | Status: AC

## 2020-08-04 NOTE — Telephone Encounter (Signed)
-----   Message from Jodell Cipro [place], [place] sent at 08/04/2020  8:19 AM EST ----- Urine culture identified an intestinal bacteria that is the most common to cause a UTI (E.coli). Recommend Cefdinir 300 mg BID #20 and drink extra water to flush out system.

## 2020-08-04 NOTE — Telephone Encounter (Signed)
Medication was sent into the pharmacy. Tried calling pt, no answer. Ok for [facility] to advise pt.

## 2020-08-05 ENCOUNTER — Other Ambulatory Visit: Payer: Self-pay | Admitting: Family Medicine

## 2020-08-05 DIAGNOSIS — G894 Chronic pain syndrome: Secondary | ICD-10-CM

## 2020-08-05 DIAGNOSIS — F988 Other specified behavioral and emotional disorders with onset usually occurring in childhood and adolescence: Secondary | ICD-10-CM

## 2020-08-05 NOTE — Telephone Encounter (Signed)
Requested medication (s) are due for refill today: meds due 08/12/20, patient requesting refill for next week  Requested medication (s) are on the active medication list: yes   Last refill:  oxycodone 07/12/20 #120 0 refills, methadone 07/12/20 #240 0 refills, adderall  07/12/20 #60 0 refills   Future visit scheduled: no   Notes to clinic:  not delegated per protocol      Requested Prescriptions  Pending Prescriptions Disp Refills   oxyCODONE (ROXICODONE) 15 MG immediate release tablet [Pharmacy Med Name: OXYCODONE HCL 15 MG TAB] 120 tablet     Sig: TAKE ONE TABLET BY MOUTH EVERY 6 HOURS AS NEEDED FOR PAIN      Not Delegated - Analgesics:  Opioid Agonists Failed - 08/05/2020 11:05 AM      Failed - This refill cannot be delegated      Failed - Urine Drug Screen completed in last 360 days      Passed - Valid encounter within last 6 months    Recent Outpatient Visits           4 days ago Dysuria   [employer], Ila E, [place]   3 months ago Pressure injury of skin of buttock, unspecified injury stage, unspecified laterality   [employer], Adriana M, PA-C   5 months ago Urinary tract infection associated with catheterization of urinary tract, unspecified indwelling urinary catheter type, subsequent encounter   [employer], Jodell Cipro, PA   7 months ago Recurrent UTI   [employer], [place] E, [place]   8 months ago Recurrent UTI   [employer], [place] E, [place]                methadone (DOLOPHINE) 10 MG tablet [Pharmacy Med Name: METHADONE HCL 10 MG TAB] 240 tablet     Sig: TAKE TWO TABLETS EVERY 6 HOURS      Not Delegated - Analgesics:  Opioid Agonists Failed - 08/05/2020 11:05 AM      Failed - This refill cannot be delegated      Failed - Urine Drug Screen completed in last 360 days      Passed - Valid encounter within last 6 months    Recent Outpatient Visits            4 days ago Dysuria   [employer], [place] E, [place]   3 months ago Pressure injury of skin of buttock, unspecified injury stage, unspecified laterality   [employer], Adriana M, PA-C   5 months ago Urinary tract infection associated with catheterization of urinary tract, unspecified indwelling urinary catheter type, subsequent encounter   [employer], [place] E, [place]   7 months ago Recurrent UTI   [employer], [place] E, [place]   8 months ago Recurrent UTI   [employer], Jodell Cipro, [place]                amphetamine-dextroamphetamine (ADDERALL) 15 MG tablet [Pharmacy Med Name: AMPHETAMINE-DEXTROAMPHETAMINE 15 MG] 60 tablet     Sig: TAKE ONE TABLET TWICE DAILY      Not Delegated - Psychiatry:  Stimulants/ADHD Failed - 08/05/2020 11:05 AM      Failed - This refill cannot be delegated      Failed - Urine Drug Screen completed in last 360 days      Passed - Valid encounter within last 3 months    Recent Outpatient  Visits           4 days ago Dysuria   [facility] Chrismon, [place] E, [place]   3 months ago Pressure injury of skin of buttock, unspecified injury stage, unspecified laterality   [facility] [place], [place], PA-C   5 months ago Urinary tract infection associated with catheterization of urinary tract, unspecified indwelling urinary catheter type, subsequent encounter   [facility] Chrismon, Jodell Cipro, [place]   7 months ago Recurrent UTI   [facility], [place] E, [place]   8 months ago Recurrent UTI   [facility] Chrismon, Jodell Cipro, [place]

## 2020-08-10 ENCOUNTER — Other Ambulatory Visit: Payer: Self-pay | Admitting: Family Medicine

## 2020-08-10 DIAGNOSIS — F988 Other specified behavioral and emotional disorders with onset usually occurring in childhood and adolescence: Secondary | ICD-10-CM

## 2020-08-10 DIAGNOSIS — G894 Chronic pain syndrome: Secondary | ICD-10-CM

## 2020-08-10 NOTE — Telephone Encounter (Signed)
Requested medication (s) are due for refill today -no  Requested medication (s) are on the active medication list -yes  Future visit scheduled -no  Last refill:08/05/20  Notes to clinic: request RF non delegated Rx- already sent to pharmacy  Requested Prescriptions  Pending Prescriptions Disp Refills   amphetamine-dextroamphetamine (ADDERALL) 15 MG tablet [Pharmacy Med Name: AMPHETAMINE-DEXTROAMPHETAMINE 15 MG] 60 tablet     Sig: TAKE ONE TABLET TWICE DAILY      Not Delegated - Psychiatry:  Stimulants/ADHD Failed - 08/10/2020  3:33 PM      Failed - This refill cannot be delegated      Failed - Urine Drug Screen completed in last 360 days      Passed - Valid encounter within last 3 months    Recent Outpatient Visits           1 week ago Dysuria   [employer], [place] E, PA   3 months ago Pressure injury of skin of buttock, unspecified injury stage, unspecified laterality   [employer], Adriana M, PA-C   6 months ago Urinary tract infection associated with catheterization of urinary tract, unspecified indwelling urinary catheter type, subsequent encounter   [employer], [place] E, PA   8 months ago Recurrent UTI   [employer], [place] E, [place]   8 months ago Recurrent UTI   [employer], Jodell Cipro, [place]                  Requested Prescriptions  Pending Prescriptions Disp Refills   amphetamine-dextroamphetamine (ADDERALL) 15 MG tablet [Pharmacy Med Name: AMPHETAMINE-DEXTROAMPHETAMINE 15 MG] 60 tablet     Sig: TAKE ONE TABLET TWICE DAILY      Not Delegated - Psychiatry:  Stimulants/ADHD Failed - 08/10/2020  3:33 PM      Failed - This refill cannot be delegated      Failed - Urine Drug Screen completed in last 360 days      Passed - Valid encounter within last 3 months    Recent Outpatient Visits           1 week ago Dysuria   [employer], [place] E, PA   3 months ago Pressure injury of skin of buttock, unspecified injury stage, unspecified laterality   [facility] [place], Adriana M, PA-C   6 months ago Urinary tract infection associated with catheterization of urinary tract, unspecified indwelling urinary catheter type, subsequent encounter   [facility] Chrismon, Jodell Cipro, [place]   8 months ago Recurrent UTI   [employer], [place] E, [place]   8 months ago Recurrent UTI   [employer], [place], [place]

## 2020-08-10 NOTE — Telephone Encounter (Signed)
Requested medication (s) are due for refill today - no  Requested medication (s) are on the active medication list -yes  Future visit scheduled -no  Last refill: 08/05/20  Notes to clinic: request RF of medication - non delegated Rx already sent to pharmacy  Requested Prescriptions  Pending Prescriptions Disp Refills   methadone (DOLOPHINE) 10 MG tablet [Pharmacy Med Name: METHADONE HCL 10 MG TAB] 240 tablet     Sig: TAKE TWO TABLETS EVERY 6 HOURS      Not Delegated - Analgesics:  Opioid Agonists Failed - 08/10/2020  3:34 PM      Failed - This refill cannot be delegated      Failed - Urine Drug Screen completed in last 360 days      Passed - Valid encounter within last 6 months    Recent Outpatient Visits           1 week ago Dysuria   [employer], [place] E, PA   3 months ago Pressure injury of skin of buttock, unspecified injury stage, unspecified laterality   [employer], Adriana M, PA-C   6 months ago Urinary tract infection associated with catheterization of urinary tract, unspecified indwelling urinary catheter type, subsequent encounter   [employer], [place] E, PA   8 months ago Recurrent UTI   [employer], [place] E, [place]   8 months ago Recurrent UTI   [employer], [place] E, [place]                oxyCODONE (ROXICODONE) 15 MG immediate release tablet [Pharmacy Med Name: OXYCODONE HCL 15 MG TAB] 120 tablet     Sig: TAKE ONE TABLET BY MOUTH EVERY 6 HOURS AS NEEDED FOR PAIN      Not Delegated - Analgesics:  Opioid Agonists Failed - 08/10/2020  3:34 PM      Failed - This refill cannot be delegated      Failed - Urine Drug Screen completed in last 360 days      Passed - Valid encounter within last 6 months    Recent Outpatient Visits           1 week ago Dysuria   [employer], [place] E, PA   3 months ago Pressure injury of  skin of buttock, unspecified injury stage, unspecified laterality   [employer], Adriana M, PA-C   6 months ago Urinary tract infection associated with catheterization of urinary tract, unspecified indwelling urinary catheter type, subsequent encounter   [employer], [place] E, [place]   8 months ago Recurrent UTI   [employer], [place] E, [place]   8 months ago Recurrent UTI   [employer], Jodell Cipro, [place]                  Requested Prescriptions  Pending Prescriptions Disp Refills   methadone (DOLOPHINE) 10 MG tablet [Pharmacy Med Name: METHADONE HCL 10 MG TAB] 240 tablet     Sig: TAKE TWO TABLETS EVERY 6 HOURS      Not Delegated - Analgesics:  Opioid Agonists Failed - 08/10/2020  3:34 PM      Failed - This refill cannot be delegated      Failed - Urine Drug Screen completed in last 360 days      Passed - Valid encounter within last 6 months    Recent Outpatient Visits  1 week ago Dysuria   [facility] Chrismon, [place] E, [place]   3 months ago Pressure injury of skin of buttock, unspecified injury stage, unspecified laterality   [facility] [place], Adriana M, PA-C   6 months ago Urinary tract infection associated with catheterization of urinary tract, unspecified indwelling urinary catheter type, subsequent encounter   [facility] Chrismon, Jodell Cipro, [place]   8 months ago Recurrent UTI   [employer], [place] E, [place]   8 months ago Recurrent UTI   [employer], [place] E, [place]                oxyCODONE (ROXICODONE) 15 MG immediate release tablet [Pharmacy Med Name: OXYCODONE HCL 15 MG TAB] 120 tablet     Sig: TAKE ONE TABLET BY MOUTH EVERY 6 HOURS AS NEEDED FOR PAIN      Not Delegated - Analgesics:  Opioid Agonists Failed - 08/10/2020  3:34 PM      Failed - This refill cannot be delegated       Failed - Urine Drug Screen completed in last 360 days      Passed - Valid encounter within last 6 months    Recent Outpatient Visits           1 week ago Dysuria   [employer], [place] E, [place]   3 months ago Pressure injury of skin of buttock, unspecified injury stage, unspecified laterality   [facility] [place], Adriana M, PA-C   6 months ago Urinary tract infection associated with catheterization of urinary tract, unspecified indwelling urinary catheter type, subsequent encounter   [facility] Chrismon, Jodell Cipro, [place]   8 months ago Recurrent UTI   [employer], [place] E, [place]   8 months ago Recurrent UTI   [employer], [place], [place]

## 2020-08-16 ENCOUNTER — Other Ambulatory Visit: Payer: Self-pay

## 2020-08-22 ENCOUNTER — Other Ambulatory Visit: Payer: Self-pay | Admitting: Family Medicine

## 2020-08-22 ENCOUNTER — Telehealth: Payer: Self-pay

## 2020-08-22 DIAGNOSIS — N319 Neuromuscular dysfunction of bladder, unspecified: Secondary | ICD-10-CM

## 2020-08-22 MED ORDER — FLAVOXATE HCL 100 MG PO TABS: 100.0000 mg | ORAL_TABLET | Freq: Three times a day (TID) | ORAL | 1 refills | Status: DC | PRN

## 2020-08-22 MED ORDER — CEFDINIR 300 MG PO CAPS: 300.0000 mg | ORAL_CAPSULE | Freq: Two times a day (BID) | ORAL | 0 refills | Status: DC

## 2020-08-22 NOTE — Telephone Encounter (Signed)
Patient is calling back to let Justin [name] know that he does not have chills because his taking tylenlol 4 times a day Patient is awaiting an antibiotic & bladder numbing medication to be sent in. Preferred Pharmacy-[facility]

## 2020-08-22 NOTE — Telephone Encounter (Signed)
Patient called and says he received a message someone from the office is trying to reach him. I asked if he received a letter from the office about his lab results. He says it said to call the office. Pt given lab results per notes of Dortha Kern, PA on 08/04/20. Pt verbalized understanding. I asked has he taken the antibiotic, he says he never received a call from [facility] about it. Per the chart, medication was not sent in. I advised that it was not sent because he was supposed to be notified. He says he is still having symptoms, the same as in the office. I advised that someone from the office will call back when they open back up to let him know if this is the antibiotic that will be sent in.  Jodell Cipro [place]ism[place], PA  08/04/2020 8:19 AM EST     Urine culture identified an intestinal bacteria that is the most common to cause a UTI (E.coli). Recommend Cefdinir 300 mg BID #20 and drink extra water to flush out system.

## 2020-08-22 NOTE — Telephone Encounter (Signed)
Re-sent Cefdinir 300 mg BID #20 and Urispas prescriptions to [facility]. Note in chart looked like it was sent, but, I don't think it was.

## 2020-08-24 ENCOUNTER — Telehealth: Payer: Self-pay

## 2020-08-24 ENCOUNTER — Other Ambulatory Visit: Payer: Self-pay | Admitting: Family Medicine

## 2020-08-24 NOTE — Telephone Encounter (Signed)
Copied from CRM [id number]. Topic: General - Inquiry >> Aug 22, 2020  4:38 PM Adrian Prince D wrote: Reason for CRM: Patient called to see if Dr. Maurine Minister has spoken to Dr. [name] about taking his parents on to be their doctor. He wants a call back at [phone]. Please advise

## 2020-08-24 NOTE — Telephone Encounter (Signed)
Requested medication (s) are due for refill today: no  Requested medication (s) are on the active medication list: yes   Last refill:  07/12/2020  Future visit scheduled: no  Notes to clinic:  this refill cannot be delegated    Requested Prescriptions  Pending Prescriptions Disp Refills   promethazine (PHENERGAN) 25 MG tablet [Pharmacy Med Name: PROMETHAZINE HCL 25 MG TAB] 30 tablet 0    Sig: TAKE 1 TABLET BY MOUTH EVERY 4 HOURS AS NEEDED FOR NAUSEA/VOMITING      Not Delegated - Gastroenterology: Antiemetics Failed - 08/24/2020 12:20 PM      Failed - This refill cannot be delegated      Passed - Valid encounter within last 6 months    Recent Outpatient Visits           3 weeks ago Dysuria   [facility] Chrismon, [place] E, [place]   4 months ago Pressure injury of skin of buttock, unspecified injury stage, unspecified laterality   [facility] [place], Adriana M, PA-C   6 months ago Urinary tract infection associated with catheterization of urinary tract, unspecified indwelling urinary catheter type, subsequent encounter   [facility] Chrismon, Jodell Cipro, [place]   8 months ago Recurrent UTI   [employer], [place] E, [place]   9 months ago Recurrent UTI   [employer], [place], [place]

## 2020-08-24 NOTE — Telephone Encounter (Signed)
i'm not currently accepting new patients.

## 2020-08-24 NOTE — Telephone Encounter (Signed)
Patient advised.

## 2020-08-24 NOTE — Telephone Encounter (Signed)
Please review. Thanks!  

## 2020-08-25 DIAGNOSIS — R339 Retention of urine, unspecified: Secondary | ICD-10-CM | POA: Diagnosis not present

## 2020-08-26 NOTE — Telephone Encounter (Signed)
LVM to call back if parents would like to establish with a provider that is accepting new patient's in the office.

## 2020-09-08 ENCOUNTER — Other Ambulatory Visit: Payer: Self-pay | Admitting: Family Medicine

## 2020-09-08 DIAGNOSIS — G894 Chronic pain syndrome: Secondary | ICD-10-CM

## 2020-09-08 NOTE — Telephone Encounter (Signed)
Requested medication (s) are due for refill today: yes  Requested medication (s) are on the active medication list:  yes  Last refill: 08/10/2020  Future visit scheduled: no  Notes to clinic: this refill cannot be delegated    Requested Prescriptions  Pending Prescriptions Disp Refills   methadone (DOLOPHINE) 10 MG tablet [Pharmacy Med Name: METHADONE HCL 10 MG TAB] 240 tablet     Sig: TAKE TWO TABLETS EVERY 6 HOURS      Not Delegated - Analgesics:  Opioid Agonists Failed - 09/08/2020 12:32 PM      Failed - This refill cannot be delegated      Failed - Urine Drug Screen completed in last 360 days      Passed - Valid encounter within last 6 months    Recent Outpatient Visits           1 month ago Dysuria   [employer], Jodell Cipro, PA-C   4 months ago Pressure injury of skin of buttock, unspecified injury stage, unspecified laterality   [employer], Adriana M, PA-C   7 months ago Urinary tract infection associated with catheterization of urinary tract, unspecified indwelling urinary catheter type, subsequent encounter   [employer], Jodell Cipro, PA-C   9 months ago Recurrent UTI   [employer], Jodell Cipro, PA-C   9 months ago Recurrent UTI   [employer], Jodell Cipro, PA-C                  oxyCODONE (ROXICODONE) 15 MG immediate release tablet [Pharmacy Med Name: OXYCODONE HCL 15 MG TAB] 120 tablet     Sig: TAKE ONE TABLET BY MOUTH EVERY 6 HOURS AS NEEDED FOR PAIN      Not Delegated - Analgesics:  Opioid Agonists Failed - 09/08/2020 12:32 PM      Failed - This refill cannot be delegated      Failed - Urine Drug Screen completed in last 360 days      Passed - Valid encounter within last 6 months    Recent Outpatient Visits           1 month ago Dysuria   [employer], Jodell Cipro, PA-C   4 months ago Pressure injury of skin of buttock,  unspecified injury stage, unspecified laterality   [facility] [place], Adriana M, PA-C   7 months ago Urinary tract infection associated with catheterization of urinary tract, unspecified indwelling urinary catheter type, subsequent encounter   [facility] Chrismon, Jodell Cipro, PA-C   9 months ago Recurrent UTI   [employer], Jodell Cipro, PA-C   9 months ago Recurrent UTI   [employer], Jodell Cipro, [place]

## 2020-09-12 ENCOUNTER — Telehealth: Payer: Self-pay | Admitting: Family Medicine

## 2020-09-12 NOTE — Telephone Encounter (Signed)
  Needing refills on oxycodone methadone.  Needing to discuss: Balloon popping sound in stomach Legs swollen and spasms  Vomiting - nauseated    Please call pt back asap at  [phone] ([phone]  Thanks, [employer]

## 2020-09-12 NOTE — Telephone Encounter (Signed)
Spoke with patient and scheduled a virtual visit with Dr. [name] to discuss symptoms. Patient reports that he has had nausea and vomiting for 2 days. He is also having leg spasms. He also mentions that he is completely out of his pain medication. He is requesting Oxycodone to be sent into [facility] pharmacy.

## 2020-09-12 NOTE — Telephone Encounter (Signed)
Patient advised.

## 2020-09-12 NOTE — Telephone Encounter (Signed)
Oxycodone was already sent to pharmacy after he called last week.

## 2020-09-13 ENCOUNTER — Telehealth (INDEPENDENT_AMBULATORY_CARE_PROVIDER_SITE_OTHER): Payer: Medicare HMO | Admitting: Family Medicine

## 2020-09-13 DIAGNOSIS — E039 Hypothyroidism, unspecified: Secondary | ICD-10-CM | POA: Diagnosis not present

## 2020-09-13 DIAGNOSIS — S24109S Unspecified injury at unspecified level of thoracic spinal cord, sequela: Secondary | ICD-10-CM

## 2020-09-13 DIAGNOSIS — R0609 Other forms of dyspnea: Secondary | ICD-10-CM

## 2020-09-13 DIAGNOSIS — R609 Edema, unspecified: Secondary | ICD-10-CM

## 2020-09-13 DIAGNOSIS — G8220 Paraplegia, unspecified: Secondary | ICD-10-CM | POA: Diagnosis not present

## 2020-09-13 DIAGNOSIS — R252 Cramp and spasm: Secondary | ICD-10-CM

## 2020-09-13 DIAGNOSIS — G894 Chronic pain syndrome: Secondary | ICD-10-CM | POA: Diagnosis not present

## 2020-09-13 DIAGNOSIS — R935 Abnormal findings on diagnostic imaging of other abdominal regions, including retroperitoneum: Secondary | ICD-10-CM | POA: Diagnosis not present

## 2020-09-13 DIAGNOSIS — R0600 Dyspnea, unspecified: Secondary | ICD-10-CM | POA: Diagnosis not present

## 2020-09-13 DIAGNOSIS — R112 Nausea with vomiting, unspecified: Secondary | ICD-10-CM | POA: Diagnosis not present

## 2020-09-13 NOTE — Progress Notes (Signed)
MyChart Video Visit    Virtual Visit via Video Note   This visit type was conducted due to national recommendations for restrictions regarding the COVID-19 Pandemic (e.g. social distancing) in an effort to limit this patient's exposure and mitigate transmission in our community. This patient is at least at moderate risk for complications without adequate follow up. This format is felt to be most appropriate for this patient at this time. Physical exam was limited by quality of the video and audio technology used for the visit.   Patient location: home Provider location: [facility]  I discussed the limitations of evaluation and management by telemedicine and the availability of in person appointments. The patient expressed understanding and agreed to proceed.  Patient: Justin [name]   DOB: 1981-06-14   39 y.o. Male  MRN: [id number] Visit Date: 09/13/2020  Today's healthcare provider: Mila Merry, MD   Chief Complaint  Patient presents with  . Leg Pain   Subjective    Leg Pain  Incident onset: 3 days ago. Pain location: in both legs. Quality: spasms. The pain has been intermittent since onset. Associated symptoms comments: Also has leg swelling with pitting edema (worse in the left leg). He has tried NSAIDs for the symptoms.    He has several other complains as documented in A&P, for which he primarily would like to have labs done to evaluate.     Medications: Outpatient Medications Prior to Visit  Medication Sig  . albuterol (PROVENTIL HFA;VENTOLIN HFA) 108 (90 Base) MCG/ACT inhaler Inhale 2 puffs into the lungs every 6 (six) hours as needed for wheezing or shortness of breath.  . alprazolam (XANAX) 2 MG tablet TAKE ONE TABLET EVERY SIX HOURS IF NEEDED  . amphetamine-dextroamphetamine (ADDERALL) 15 MG tablet TAKE ONE TABLET TWICE DAILY  . baclofen (LIORESAL) 20 MG tablet TAKE ONE TABLET BY MOUTH FOUR TIMES DAILY  . clotrimazole (LOTRIMIN) 1 % cream Apply 1 application  topically 2 (two) times daily.  . clotrimazole-betamethasone (LOTRISONE) cream Apply 1 application topically 2 (two) times daily.  . diphenhydrAMINE (BENADRYL) 25 MG tablet Take 1 tablet by mouth as needed.  . docusate sodium (COLACE) 250 MG capsule Take 1 capsule (250 mg total) by mouth 4 (four) times daily as needed.  . dronabinol (MARINOL) 10 MG capsule Take 1 capsule (10 mg total) by mouth every 6 (six) hours as needed.  . flavoxATE (URISPAS) 100 MG tablet Take 1 tablet (100 mg total) by mouth 3 (three) times daily as needed for bladder spasms.  . fluticasone (FLONASE) 50 MCG/ACT nasal spray Place 2 sprays into both nostrils daily.  Marland Kitchen gabapentin (NEURONTIN) 800 MG tablet Take 1 tablet (800 mg total) by mouth 4 (four) times daily.  Marland Kitchen levothyroxine (SYNTHROID) 75 MCG tablet TAKE 1 TABLET EVERY DAY ON EMPTY STOMACHWITH A GLASS OF WATER AT LEAST 30-60 MINBEFORE BREAKFAST  . lidocaine (LMX) 4 % cream Apply 1 application topically 3 (three) times daily.  Marland Kitchen LINZESS 145 MCG CAPS capsule TAKE 1 CAPSULE BY MOUTH DAILY BEFORE BREAKFAST.  . methadone (DOLOPHINE) 10 MG tablet TAKE TWO TABLETS EVERY 6 HOURS  . Mineral Oil OIL daily.  . naproxen (NAPROSYN) 500 MG tablet TAKE ONE TABLET TWICE DAILY  . omeprazole (PRILOSEC) 20 MG capsule TAKE 1 CAPSULE BY MOUTH EVERY DAY  . oxyCODONE (ROXICODONE) 15 MG immediate release tablet TAKE ONE TABLET BY MOUTH EVERY 6 HOURS AS NEEDED FOR PAIN  . PARoxetine (PAXIL) 30 MG tablet TAKE 2 TABLETS BY MOUTH DAILY  .  phenazopyridine (PYRIDIUM) 200 MG tablet Take 1 tablet (200 mg total) by mouth 3 (three) times daily.  . polyethylene glycol powder (QC NATURA-LAX) 17 GM/SCOOP powder MIX 1 CAPFUL IN 8 0Z OF WATER OR JUICE AND DRINK ONCE A DAY  . promethazine (PHENERGAN) 25 MG suppository Place 1 suppository (25 mg total) rectally every 6 (six) hours as needed for nausea or vomiting.  . promethazine (PHENERGAN) 25 MG tablet TAKE 1 TABLET BY MOUTH EVERY 4 HOURS AS NEEDED FOR  NAUSEA/VOMITING  . sildenafil (REVATIO) 20 MG tablet Take 3 to 5 tablets two hours before intercouse on an empty stomach.  Do not take with nitrates.  . silver sulfADIAZINE (SILVADENE) 1 % cream Apply 1 application topically 2 (two) times daily.  Marland Kitchen tiZANidine (ZANAFLEX) 4 MG tablet TAKE TWO TABLETS FOUR TIMES DAILY  . TOVIAZ 8 MG TB24 tablet TAKE 1 TABLET BY MOUTH DAILY  . VESICARE 10 MG tablet TAKE 1 TABLET BY MOUTH DAILY   Facility-Administered Medications Prior to Visit  Medication Dose Route Frequency Provider  . methylPREDNISolone acetate (DEPO-MEDROL) injection 40 mg  40 mg Intramuscular Once Malva Limes, MD    Review of Systems  Constitutional: Negative for appetite change, chills and fever.  Respiratory: Positive for shortness of breath. Negative for chest tightness and wheezing.   Cardiovascular: Positive for leg swelling (worsened in the past month). Negative for chest pain and palpitations.  Gastrointestinal: Positive for nausea (chronic) and vomiting. Negative for abdominal pain.       Occasional popping sound in abdomen  Skin: Positive for rash (red spots on legs).  Neurological: Positive for headaches.  Hematological: Bruises/bleeds easily.      Objective    There were no vitals taken for this visit.   Physical Exam   Awake, alert, oriented x 3. In no apparent distress   Assessment & Plan     1. Edema, unspecified type  - CBC; Future - Comprehensive metabolic panel; Future - T4, free; Future - TSH; Future  2. Nausea and vomiting, intractability of vomiting not specified, unspecified vomiting type   3. Hypothyroidism, unspecified type  - T4, free; Future - TSH; Future  4. Paraplegia (HCC)  - Ambulatory referral to Pain Clinic  5. Injury of thoracic spinal cord, sequela (HCC)  - Ambulatory referral to Pain Clinic  6. Spasm  - Ambulatory referral to Pain Clinic  7. Chronic pain associated with significant psychosocial dysfunction  -  Ambulatory referral to Pain Clinic  8. Abdominal ultrasound, abnormal  - US Abdomen Limited; Future  9. Dyspnea on exertion  - D-Dimer, Quantitative; Future - B Nat Peptide; Future   No follow-ups on file.     I discussed the assessment and treatment plan with the patient. The patient was provided an opportunity to ask questions and all were answered. The patient agreed with the plan and demonstrated an understanding of the instructions.  Video connection was lost when less than 50% of the duration of the visit was complete, at which time the remainder of the visit was completed via audio only.   The patient was advised to call back or seek an in-person evaluation if the symptoms worsen or if the condition fails to improve as anticipated.  I provided 15 minutes of non-face-to-face time during this encounter.  The entirety of the information documented in the History of Present Illness, Review of Systems and Physical Exam were personally obtained by me. Portions of this information were initially documented by the CMA and  reviewed by me for thoroughness and accuracy.     Mila Merry, MD [facility] [phone] (phone) [phone] (fax)  [facility]

## 2020-09-15 ENCOUNTER — Telehealth (INDEPENDENT_AMBULATORY_CARE_PROVIDER_SITE_OTHER): Payer: Medicare HMO

## 2020-09-15 ENCOUNTER — Other Ambulatory Visit: Payer: Self-pay

## 2020-09-15 DIAGNOSIS — E039 Hypothyroidism, unspecified: Secondary | ICD-10-CM

## 2020-09-15 DIAGNOSIS — R609 Edema, unspecified: Secondary | ICD-10-CM | POA: Diagnosis not present

## 2020-09-15 DIAGNOSIS — R300 Dysuria: Secondary | ICD-10-CM

## 2020-09-15 DIAGNOSIS — R0609 Other forms of dyspnea: Secondary | ICD-10-CM

## 2020-09-15 DIAGNOSIS — R0600 Dyspnea, unspecified: Secondary | ICD-10-CM

## 2020-09-15 DIAGNOSIS — R935 Abnormal findings on diagnostic imaging of other abdominal regions, including retroperitoneum: Secondary | ICD-10-CM

## 2020-09-15 LAB — POCT URINALYSIS DIPSTICK
Blood, UA: NEGATIVE
Glucose, UA: NEGATIVE
Ketones, UA: NEGATIVE
Nitrite, UA: NEGATIVE
Protein, UA: NEGATIVE
Spec Grav, UA: 1.02 (ref 1.010–1.025)
Urobilinogen, UA: 0.2 E.U./dL
pH, UA: 6 (ref 5.0–8.0)

## 2020-09-15 NOTE — Telephone Encounter (Signed)
Urine sample brought in. Please review POCT UA results. Urine culture has been drawn up and sent out to lab.

## 2020-09-15 NOTE — Telephone Encounter (Signed)
Dr. [name] is out of the office until Monday 09/20/2019. Please review and advise.

## 2020-09-15 NOTE — Telephone Encounter (Signed)
Copied from CRM [id number]. Topic: General - Other >> Sep 15, 2020  8:02 AM Jaquita Rector A wrote: Reason for CRM: Patient called in to ask Dr [name] if its ok to drop off a urine sample when he come to the lab for blood work today. Per patient he is coming to the lab for blood work and woke at 3 AM with burning when urinating pain in the bladder area and a dark color to the urine maybe blood in the urine plus it also have an odor. Asking for a call back at Ph# [phone]

## 2020-09-15 NOTE — Telephone Encounter (Signed)
Will take a look at the specimen if he leaves one.

## 2020-09-15 NOTE — Telephone Encounter (Signed)
Patient advised. He is going to try to bring in a sample today.

## 2020-09-16 LAB — CBC
Hematocrit: 41 % (ref 37.5–51.0)
Hemoglobin: 14.1 g/dL (ref 13.0–17.7)
MCH: 27.3 pg (ref 26.6–33.0)
MCHC: 34.4 g/dL (ref 31.5–35.7)
MCV: 79 fL (ref 79–97)
Platelets: 141 10*3/uL — ABNORMAL LOW (ref 150–450)
RBC: 5.17 x10E6/uL (ref 4.14–5.80)
RDW: 12.5 % (ref 11.6–15.4)
WBC: 4.4 10*3/uL (ref 3.4–10.8)

## 2020-09-16 LAB — COMPREHENSIVE METABOLIC PANEL
ALT: 24 IU/L (ref 0–44)
AST: 19 IU/L (ref 0–40)
Albumin/Globulin Ratio: 1.9 (ref 1.2–2.2)
Albumin: 4.3 g/dL (ref 4.0–5.0)
Alkaline Phosphatase: 74 IU/L (ref 44–121)
BUN/Creatinine Ratio: 11 (ref 9–20)
BUN: 9 mg/dL (ref 6–20)
Bilirubin Total: 0.3 mg/dL (ref 0.0–1.2)
CO2: 25 mmol/L (ref 20–29)
Calcium: 9 mg/dL (ref 8.7–10.2)
Chloride: 98 mmol/L (ref 96–106)
Creatinine, Ser: 0.8 mg/dL (ref 0.76–1.27)
GFR calc Af Amer: 130 mL/min/{1.73_m2} (ref >59)
GFR calc non Af Amer: 113 mL/min/{1.73_m2} (ref >59)
Globulin, Total: 2.3 g/dL (ref 1.5–4.5)
Glucose: 90 mg/dL (ref 65–99)
Potassium: 4 mmol/L (ref 3.5–5.2)
Sodium: 137 mmol/L (ref 134–144)
Total Protein: 6.6 g/dL (ref 6.0–8.5)

## 2020-09-16 LAB — TSH: TSH: 4.53 u[IU]/mL — ABNORMAL HIGH (ref 0.450–4.500)

## 2020-09-16 LAB — T4, FREE: Free T4: 1.71 ng/dL (ref 0.82–1.77)

## 2020-09-16 LAB — BRAIN NATRIURETIC PEPTIDE: BNP: 8.1 pg/mL (ref 0.0–100.0)

## 2020-09-16 LAB — D-DIMER, QUANTITATIVE: D-DIMER: 0.35 mg/L FEU (ref 0.00–0.49)

## 2020-09-19 ENCOUNTER — Other Ambulatory Visit: Payer: Self-pay | Admitting: Family Medicine

## 2020-09-19 ENCOUNTER — Telehealth: Payer: Self-pay

## 2020-09-19 MED ORDER — LEVOTHYROXINE SODIUM 88 MCG PO TABS: 88.0000 ug | ORAL_TABLET | Freq: Every day | ORAL | 3 refills | Status: DC

## 2020-09-19 NOTE — Telephone Encounter (Signed)
Advised patient of results.  

## 2020-09-19 NOTE — Telephone Encounter (Signed)
Copied from CRM [id number]. Topic: General - Call Back - No Documentation >> Sep 19, 2020 10:45 AM Randol Kern wrote: Reason for CRM: [phone]  Pt would like a call back from nurse to discuss lab results, please advise

## 2020-09-20 ENCOUNTER — Ambulatory Visit: Admission: RE | Admit: 2020-09-20 | Payer: Medicare HMO | Source: Ambulatory Visit

## 2020-09-23 ENCOUNTER — Encounter: Payer: Self-pay | Admitting: Family Medicine

## 2020-09-23 ENCOUNTER — Telehealth: Payer: Self-pay

## 2020-09-23 LAB — URINE CULTURE

## 2020-09-23 MED ORDER — DOXYCYCLINE HYCLATE 100 MG PO TABS: 100.0000 mg | ORAL_TABLET | Freq: Two times a day (BID) | ORAL | 0 refills | Status: DC

## 2020-09-23 NOTE — Telephone Encounter (Signed)
-----   Message from Tamsen Roers, PA-C sent at 09/23/2020  8:46 AM EST ----- Culture grew a small amount of an intestinal bacteria that is sensitive to Doxycycline 100 mg BID #20 (which pharmacy to send to?).

## 2020-09-26 ENCOUNTER — Encounter: Payer: Self-pay | Admitting: Family Medicine

## 2020-09-26 DIAGNOSIS — N529 Male erectile dysfunction, unspecified: Secondary | ICD-10-CM | POA: Diagnosis not present

## 2020-09-26 DIAGNOSIS — N390 Urinary tract infection, site not specified: Secondary | ICD-10-CM | POA: Diagnosis not present

## 2020-09-26 DIAGNOSIS — N319 Neuromuscular dysfunction of bladder, unspecified: Secondary | ICD-10-CM | POA: Diagnosis not present

## 2020-10-03 ENCOUNTER — Ambulatory Visit: Admission: RE | Admit: 2020-10-03 | Payer: Medicare HMO | Source: Ambulatory Visit

## 2020-10-07 ENCOUNTER — Other Ambulatory Visit: Payer: Self-pay | Admitting: Family Medicine

## 2020-10-07 DIAGNOSIS — G894 Chronic pain syndrome: Secondary | ICD-10-CM

## 2020-10-21 DIAGNOSIS — R339 Retention of urine, unspecified: Secondary | ICD-10-CM | POA: Diagnosis not present

## 2020-10-28 ENCOUNTER — Other Ambulatory Visit: Payer: Self-pay | Admitting: Family Medicine

## 2020-10-28 NOTE — Telephone Encounter (Signed)
Requested medication (s) are due for refill today: no  Requested medication (s) are on the active medication list: yes   Last refill:  10/28/2020  Future visit scheduled: no  Notes to clinic:  this refill cannot be delegated    Requested Prescriptions  Pending Prescriptions Disp Refills   promethazine (PHENERGAN) 25 MG tablet [Pharmacy Med Name: PROMETHAZINE HCL 25 MG TAB] 30 tablet 2    Sig: TAKE 1 TABLET BY MOUTH EVERY 4 HOURS AS NEEDED FOR NAUSEA/VOMITING      Not Delegated - Gastroenterology: Antiemetics Failed - 10/28/2020 12:58 PM      Failed - This refill cannot be delegated      Passed - Valid encounter within last 6 months    Recent Outpatient Visits           1 month ago Edema, unspecified type   [facility] Malva Limes, [facility]   2 months ago Dysuria   [facility] Chrismon, Jodell Cipro, PA-C   6 months ago Pressure injury of skin of buttock, unspecified injury stage, unspecified laterality   [facility] Ali Chuk, Adriana M, PA-C   8 months ago Urinary tract infection associated with catheterization of urinary tract, unspecified indwelling urinary catheter type, subsequent encounter   [facility] Chrismon, Jodell Cipro, PA-C   10 months ago Recurrent UTI   [facility] Chrismon, Jodell Cipro, [place]

## 2020-11-07 ENCOUNTER — Telehealth: Payer: Self-pay | Admitting: Family Medicine

## 2020-11-07 NOTE — Telephone Encounter (Signed)
Copied from CRM [id number]. Topic: Medicare AWV >> Nov 07, 2020 11:07 AM Claudette Laws R wrote: Reason for CRM:  Left message for patient to call back and schedule Medicare Annual Wellness Visit (AWV) in office.   If not able to come in the office, please offer to do virtually or by telephone.   No hx of AWV - AWV-I eligible as of 10/18/2012  Please schedule at anytime with [facility] Health Advisor.   40 minute appointment  Any questions, please contact me at [phone]

## 2020-11-08 ENCOUNTER — Other Ambulatory Visit: Payer: Self-pay | Admitting: Family Medicine

## 2020-11-08 DIAGNOSIS — G894 Chronic pain syndrome: Secondary | ICD-10-CM

## 2020-11-08 DIAGNOSIS — F988 Other specified behavioral and emotional disorders with onset usually occurring in childhood and adolescence: Secondary | ICD-10-CM

## 2020-11-09 NOTE — Telephone Encounter (Signed)
Requested medication (s) are due for refill today: yes  Requested medication (s) are on the active medication list:yes  Last refill:  10/12/2020  Future visit scheduled: no  Notes to clinic: this refill cannot be delegated    Requested Prescriptions  Pending Prescriptions Disp Refills   methadone (DOLOPHINE) 10 MG tablet [Pharmacy Med Name: METHADONE HCL 10 MG TAB] 240 tablet     Sig: TAKE TWO TABLETS EVERY 6 HOURS      Not Delegated - Analgesics:  Opioid Agonists Failed - 11/08/2020  5:31 PM      Failed - This refill cannot be delegated      Failed - Urine Drug Screen completed in last 360 days      Passed - Valid encounter within last 6 months    Recent Outpatient Visits           1 month ago Edema, unspecified type   [facility] Malva Limes, MD   3 months ago Dysuria   [facility] Chrismon, Jodell Cipro, PA-C   6 months ago Pressure injury of skin of buttock, unspecified injury stage, unspecified laterality   [facility] Jodi Marble, Adriana M, PA-C   9 months ago Urinary tract infection associated with catheterization of urinary tract, unspecified indwelling urinary catheter type, subsequent encounter   [facility] Chrismon, Jodell Cipro, PA-C   11 months ago Recurrent UTI   [employer], Jodell Cipro, PA-C                  oxyCODONE (ROXICODONE) 15 MG immediate release tablet [Pharmacy Med Name: OXYCODONE HCL 15 MG TAB] 120 tablet     Sig: TAKE ONE TABLET BY MOUTH EVERY 6 HOURS AS NEEDED FOR PAIN      Not Delegated - Analgesics:  Opioid Agonists Failed - 11/08/2020  5:31 PM      Failed - This refill cannot be delegated      Failed - Urine Drug Screen completed in last 360 days      Passed - Valid encounter within last 6 months    Recent Outpatient Visits           1 month ago Edema, unspecified type   [facility] Malva Limes, MD   3 months ago Dysuria   [facility] Chrismon, Jodell Cipro, PA-C   6 months ago Pressure injury of skin of buttock, unspecified injury stage, unspecified laterality   [employer], Adriana M, PA-C   9 months ago Urinary tract infection associated with catheterization of urinary tract, unspecified indwelling urinary catheter type, subsequent encounter   [facility] Chrismon, Jodell Cipro, PA-C   11 months ago Recurrent UTI   [employer], Jodell Cipro, PA-C                  amphetamine-dextroamphetamine (ADDERALL) 15 MG tablet [Pharmacy Med Name: AMPHETAMINE-DEXTROAMPHETAMINE 15 MG] 60 tablet     Sig: TAKE ONE (1) TABLET BY MOUTH TWO TIMES PER DAY      Not Delegated - Psychiatry:  Stimulants/ADHD Failed - 11/08/2020  5:31 PM      Failed - This refill cannot be delegated      Failed - Urine Drug Screen completed in last 360 days      Passed - Valid encounter within last 3 months    Recent Outpatient Visits           1 month ago Edema,  unspecified type   [facility] Malva Limes, MD   3 months ago Dysuria   [facility] Chrismon, Jodell Cipro, PA-C   6 months ago Pressure injury of skin of buttock, unspecified injury stage, unspecified laterality   [facility] Osvaldo Angst M, [place]   9 months ago Urinary tract infection associated with catheterization of urinary tract, unspecified indwelling urinary catheter type, subsequent encounter   [facility] Chrismon, Jodell Cipro, PA-C   11 months ago Recurrent UTI   [facility] Chrismon, Jodell Cipro, [place]

## 2020-11-10 NOTE — Telephone Encounter (Signed)
Pt called back and wanted to know the status of this medication. Pt stated they called the pharmacy already and they are waiting for approval.

## 2020-11-11 ENCOUNTER — Telehealth: Payer: Self-pay

## 2020-11-11 NOTE — Telephone Encounter (Signed)
Message sent by mistake to [place]. Intended for Dr. [name]. Please advise?

## 2020-11-11 NOTE — Telephone Encounter (Signed)
Copied from CRM [id number]. Topic: General - Other >> Nov 10, 2020  4:46 PM Pawlus, Maxine Glenn A wrote: Reason for CRM: Pt wanted a call back from Dr Sullivan Lone or his nurse to discuss his medications.

## 2020-11-11 NOTE — Telephone Encounter (Signed)
Pt called and he did not answer. Left VM to return call.

## 2020-11-14 NOTE — Telephone Encounter (Signed)
Please review and advise on home PT request..   Maralyn Sago, can you assist patient with rescheduling missed [place] appointment? Or does a new order need to be placed?

## 2020-11-14 NOTE — Telephone Encounter (Signed)
LMTCB-PT in order to give him contact information to reschedule appointment for ultrasound.  Phone [phone]

## 2020-11-14 NOTE — Telephone Encounter (Signed)
Spoke to patient- he missed his [place] appointment and would like help rescheduling that . Patient also is requesting home PT to help him try to relax the ligaments in his leg- they are tight and he is unable to bend them 90 degree angle. He is afraid he is going to be unable to get into his car. Advised will alert provider.

## 2020-11-29 ENCOUNTER — Telehealth (INDEPENDENT_AMBULATORY_CARE_PROVIDER_SITE_OTHER): Payer: Medicare HMO | Admitting: Family Medicine

## 2020-11-29 DIAGNOSIS — R300 Dysuria: Secondary | ICD-10-CM | POA: Diagnosis not present

## 2020-11-29 DIAGNOSIS — N390 Urinary tract infection, site not specified: Secondary | ICD-10-CM | POA: Diagnosis not present

## 2020-11-29 NOTE — Telephone Encounter (Signed)
Pt has burning with urination, bad odor, orange and dark color. also causing leg spasms.  Pt is requesting to drop off urine. Wife is at home today and can bring in sample. Pt states he does this all the time.  Pt states it has been going on since last week, but he did not have a way to bring in sample. Is that OK?

## 2020-11-29 NOTE — Telephone Encounter (Signed)
Pt called back and said he cannot make it by 4:30pm, pt stated he will drop it off tomorrow. Please advise if there is a specific time he needs to do this by tomorrow.

## 2020-11-29 NOTE — Telephone Encounter (Signed)
Patient plans to drop off urine sample tomorrow around 3:30pm-4:30pm.

## 2020-11-29 NOTE — Telephone Encounter (Signed)
That's fine

## 2020-11-29 NOTE — Telephone Encounter (Signed)
Left message to call back. OK for [facility] to advise if patient returns call.

## 2020-11-30 DIAGNOSIS — N390 Urinary tract infection, site not specified: Secondary | ICD-10-CM | POA: Diagnosis not present

## 2020-11-30 DIAGNOSIS — R300 Dysuria: Secondary | ICD-10-CM | POA: Diagnosis not present

## 2020-11-30 DIAGNOSIS — R339 Retention of urine, unspecified: Secondary | ICD-10-CM | POA: Diagnosis not present

## 2020-11-30 LAB — POCT URINALYSIS DIPSTICK
Bilirubin, UA: NEGATIVE
Glucose, UA: NEGATIVE
Ketones, UA: NEGATIVE
Nitrite, UA: POSITIVE
Protein, UA: POSITIVE — AB
Spec Grav, UA: 1.015 (ref 1.010–1.025)
Urobilinogen, UA: 0.2 E.U./dL
pH, UA: 8 (ref 5.0–8.0)

## 2020-11-30 NOTE — Telephone Encounter (Signed)
Urine sample brought by the office by patients wife. POCT UA performed and urine sample sent to [facility] for culture and sensitivity. Please review results and advise.   Pharmacy:[facility].

## 2020-12-03 LAB — URINE CULTURE

## 2020-12-05 ENCOUNTER — Other Ambulatory Visit: Payer: Self-pay | Admitting: Family Medicine

## 2020-12-05 DIAGNOSIS — N3000 Acute cystitis without hematuria: Secondary | ICD-10-CM

## 2020-12-05 MED ORDER — CEFDINIR 300 MG PO CAPS
600.0000 mg | ORAL_CAPSULE | Freq: Every day | ORAL | 0 refills | Status: AC
Start: 2020-12-05 — End: 2020-12-15

## 2020-12-07 ENCOUNTER — Other Ambulatory Visit: Payer: Self-pay | Admitting: Family Medicine

## 2020-12-08 ENCOUNTER — Other Ambulatory Visit: Payer: Self-pay | Admitting: Family Medicine

## 2020-12-08 DIAGNOSIS — G894 Chronic pain syndrome: Secondary | ICD-10-CM

## 2020-12-08 DIAGNOSIS — F988 Other specified behavioral and emotional disorders with onset usually occurring in childhood and adolescence: Secondary | ICD-10-CM

## 2020-12-08 NOTE — Telephone Encounter (Signed)
Requested medication (s) are due for refill today: Alprazolam, yes  Requested medication (s) are on the active medication list: yes  Last refill:  07/12/20  Future visit scheduled: no  Notes to clinic:  not delegated  Requested medication (s) are due for refill today: Oxycodone & Methadone  Requested medication (s) are on the active medication list: yes  Last refill:  11/10/20  Future visit scheduled: no  Notes to clinic:  not delegated       Requested Prescriptions  Pending Prescriptions Disp Refills   oxyCODONE (ROXICODONE) 15 MG immediate release tablet 120 tablet 0    Sig: Take 1 tablet (15 mg total) by mouth every 6 (six) hours as needed. for pain      Not Delegated - Analgesics:  Opioid Agonists Failed - 12/08/2020  3:00 PM      Failed - This refill cannot be delegated      Failed - Urine Drug Screen completed in last 360 days      Passed - Valid encounter within last 6 months    Recent Outpatient Visits           2 months ago Edema, unspecified type   [facility] Malva Limes, MD   4 months ago Dysuria   [facility] Chrismon, Jodell Cipro, PA-C   7 months ago Pressure injury of skin of buttock, unspecified injury stage, unspecified laterality   [facility] Jodi Marble, Adriana M, PA-C   10 months ago Urinary tract infection associated with catheterization of urinary tract, unspecified indwelling urinary catheter type, subsequent encounter   [facility] Chrismon, Jodell Cipro, PA-C   12 months ago Recurrent UTI   [employer], Jodell Cipro, PA-C                  methadone (DOLOPHINE) 10 MG tablet 240 tablet 0      Not Delegated - Analgesics:  Opioid Agonists Failed - 12/08/2020  3:00 PM      Failed - This refill cannot be delegated      Failed - Urine Drug Screen completed in last 360 days      Passed - Valid encounter within last 6 months    Recent Outpatient Visits            2 months ago Edema, unspecified type   [facility] Malva Limes, MD   4 months ago Dysuria   [facility] Chrismon, Jodell Cipro, PA-C   7 months ago Pressure injury of skin of buttock, unspecified injury stage, unspecified laterality   [facility] Jodi Marble, Adriana M, PA-C   10 months ago Urinary tract infection associated with catheterization of urinary tract, unspecified indwelling urinary catheter type, subsequent encounter   [facility] Chrismon, Jodell Cipro, PA-C   12 months ago Recurrent UTI   [employer], Jodell Cipro, PA-C                  amphetamine-dextroamphetamine (ADDERALL) 15 MG tablet 60 tablet 0      Not Delegated - Psychiatry:  Stimulants/ADHD Failed - 12/08/2020  3:00 PM      Failed - This refill cannot be delegated      Failed - Urine Drug Screen completed in last 360 days      Passed - Valid encounter within last 3 months    Recent Outpatient Visits           2 months ago  Edema, unspecified type   [facility] Malva Limes, MD   4 months ago Dysuria   [facility] Chrismon, Jodell Cipro, PA-C   7 months ago Pressure injury of skin of buttock, unspecified injury stage, unspecified laterality   [facility] Osvaldo Angst M, PA-C   10 months ago Urinary tract infection associated with catheterization of urinary tract, unspecified indwelling urinary catheter type, subsequent encounter   [facility] Chrismon, Jodell Cipro, PA-C   12 months ago Recurrent UTI   [facility] Chrismon, Jodell Cipro, [place]

## 2020-12-08 NOTE — Telephone Encounter (Signed)
Requested medication (s) are due for refill today undetermined  Requested medication (s) are on the active medication list Yes  Future visit scheduled No. Last visit was by video on 09/13/20.   Oxycodone last ordered 11/10/20 #120 tabs Methadone last ordered 11/10/20 #240 Xanax last ordered 07/12/20 #120 tabs plus 3 refills  Note to clinic-These medications are not delegated for [facility] refill.   Requested Prescriptions  Pending Prescriptions Disp Refills   oxyCODONE (ROXICODONE) 15 MG immediate release tablet [Pharmacy Med Name: OXYCODONE HCL 15 MG TAB] 120 tablet     Sig: TAKE ONE TABLET BY MOUTH EVERY 6 HOURS AS NEEDED FOR PAIN      Not Delegated - Analgesics:  Opioid Agonists Failed - 12/08/2020 12:37 PM      Failed - This refill cannot be delegated      Failed - Urine Drug Screen completed in last 360 days      Passed - Valid encounter within last 6 months    Recent Outpatient Visits           2 months ago Edema, unspecified type   [facility] Malva Limes, MD   4 months ago Dysuria   [facility] Chrismon, Jodell Cipro, PA-C   7 months ago Pressure injury of skin of buttock, unspecified injury stage, unspecified laterality   [facility] Jodi Marble, Adriana M, PA-C   10 months ago Urinary tract infection associated with catheterization of urinary tract, unspecified indwelling urinary catheter type, subsequent encounter   [facility] Chrismon, Jodell Cipro, PA-C   12 months ago Recurrent UTI   [employer], Jodell Cipro, PA-C                  methadone (DOLOPHINE) 10 MG tablet [Pharmacy Med Name: METHADONE HCL 10 MG TAB] 240 tablet     Sig: TAKE TWO TABLETS EVERY 6 HOURS      Not Delegated - Analgesics:  Opioid Agonists Failed - 12/08/2020 12:37 PM      Failed - This refill cannot be delegated      Failed - Urine Drug Screen completed in last 360 days      Passed - Valid encounter within last 6  months    Recent Outpatient Visits           2 months ago Edema, unspecified type   [facility] Malva Limes, MD   4 months ago Dysuria   [facility] Chrismon, Jodell Cipro, PA-C   7 months ago Pressure injury of skin of buttock, unspecified injury stage, unspecified laterality   [employer], Adriana M, PA-C   10 months ago Urinary tract infection associated with catheterization of urinary tract, unspecified indwelling urinary catheter type, subsequent encounter   [facility] Chrismon, Jodell Cipro, PA-C   12 months ago Recurrent UTI   [employer], Jodell Cipro, PA-C                  alprazolam Prudy Feeler) 2 MG tablet [Pharmacy Med Name: ALPRAZOLAM 2 MG TAB] 120 tablet     Sig: TAKE ONE TABLET EVERY SIX HOURS IF NEEDED      Not Delegated - Psychiatry:  Anxiolytics/Hypnotics Failed - 12/08/2020 12:37 PM      Failed - This refill cannot be delegated      Failed - Urine Drug Screen completed in last 360 days      Passed - Valid encounter within last  6 months    Recent Outpatient Visits           2 months ago Edema, unspecified type   [facility] Malva Limes, MD   4 months ago Dysuria   [facility] Chrismon, Jodell Cipro, PA-C   7 months ago Pressure injury of skin of buttock, unspecified injury stage, unspecified laterality   [facility] Osvaldo Angst M, PA-C   10 months ago Urinary tract infection associated with catheterization of urinary tract, unspecified indwelling urinary catheter type, subsequent encounter   [facility] Chrismon, Jodell Cipro, PA-C   12 months ago Recurrent UTI   [facility] Chrismon, Jodell Cipro, [place]

## 2020-12-08 NOTE — Telephone Encounter (Signed)
Medication: oxyCODONE (ROXICODONE) 15 MG immediate release tablet [[id number]] , methadone (DOLOPHINE) 10 MG tablet [[id number]] , alprazolam (XANAX) 2 MG tablet [[id number]] ,   Has the patient contacted their pharmacy? YES (Agent: If no, request that the patient contact the pharmacy for the refill.) (Agent: If yes, when and what did the pharmacy advise?)  Preferred Pharmacy (with phone number or street name): [facility] - [place], [place] - [address] [place] [address] Phone: [phone] Fax: [phone] Hours: Not open 24 hours    Agent: Please be advised that RX refills may take up to 3 business days. We ask that you follow-up with your pharmacy.

## 2020-12-09 MED ORDER — AMPHETAMINE-DEXTROAMPHETAMINE 15 MG PO TABS: ORAL_TABLET | ORAL | 0 refills | Status: DC

## 2020-12-09 MED ORDER — METHADONE HCL 10 MG PO TABS: ORAL_TABLET | ORAL | 0 refills | Status: DC

## 2020-12-09 MED ORDER — OXYCODONE HCL 15 MG PO TABS: 15.0000 mg | ORAL_TABLET | Freq: Four times a day (QID) | ORAL | 0 refills | Status: DC | PRN

## 2020-12-14 ENCOUNTER — Other Ambulatory Visit: Payer: Self-pay | Admitting: Family Medicine

## 2020-12-14 NOTE — Telephone Encounter (Signed)
Requested medication (s) are due for refill today: Yes  Requested medication (s) are on the active medication list: Yes  Last refill:  07/12/20  Future visit scheduled: No  Notes to clinic:  See request.    Requested Prescriptions  Pending Prescriptions Disp Refills   alprazolam (XANAX) 2 MG tablet [Pharmacy Med Name: ALPRAZOLAM 2 MG TAB] 120 tablet     Sig: TAKE ONE TABLET EVERY SIX HOURS IF NEEDED      Not Delegated - Psychiatry:  Anxiolytics/Hypnotics Failed - 12/14/2020  2:16 PM      Failed - This refill cannot be delegated      Failed - Urine Drug Screen completed in last 360 days      Passed - Valid encounter within last 6 months    Recent Outpatient Visits           3 months ago Edema, unspecified type   [facility] Malva Limes, MD   4 months ago Dysuria   [facility] Chrismon, Jodell Cipro, PA-C   7 months ago Pressure injury of skin of buttock, unspecified injury stage, unspecified laterality   [facility] Jodi Marble, Adriana M, PA-C   10 months ago Urinary tract infection associated with catheterization of urinary tract, unspecified indwelling urinary catheter type, subsequent encounter   [facility] Chrismon, Jodell Cipro, PA-C   1 year ago Recurrent UTI   [facility] Chrismon, Jodell Cipro, [place]

## 2020-12-14 NOTE — Telephone Encounter (Signed)
Pt called about the refill request for alprazolam Prudy Feeler) 2 MG tablet  / pt stated the pharmacy had made a request three times/ pt states he has one pill left for today and will be out/ pt states it is dangerous to stop taking this medication and doesn't want to run out/ please advise

## 2020-12-16 ENCOUNTER — Other Ambulatory Visit: Payer: Self-pay | Admitting: Family Medicine

## 2020-12-16 DIAGNOSIS — B356 Tinea cruris: Secondary | ICD-10-CM

## 2020-12-16 NOTE — Telephone Encounter (Signed)
Medication: silver sulfADIAZINE (SILVADENE) 1 % cream  Has the pt contacted their pharmacy? No  Preferred pharmacy: [facility] - [place], [place] - [address]  Please be advised refills may take up to 3 business days.  We ask that you follow up with your pharmacy.  Pt states he just ran out of cream.  He has small place on his bottom that is almost healed.  But he needs his cream. This is urgent.

## 2020-12-16 NOTE — Telephone Encounter (Signed)
Requested medication (s) are due for refill today: yes  Requested medication (s) are on the active medication list: yes  Last refill:  08/01/20 #50 0 refill  Future visit scheduled: no  Notes to clinic:  medication not assigned to a protocol, review manually     Requested Prescriptions  Pending Prescriptions Disp Refills   silver sulfADIAZINE (SILVADENE) 1 % cream 50 g 0    Sig: Apply 1 application topically 2 (two) times daily.      Off-Protocol Failed - 12/16/2020  3:28 PM      Failed - Medication not assigned to a protocol, review manually.      Passed - Valid encounter within last 12 months    Recent Outpatient Visits           3 months ago Edema, unspecified type   [facility] Malva Limes, MD   4 months ago Dysuria   [facility] Chrismon, Jodell Cipro, PA-C   7 months ago Pressure injury of skin of buttock, unspecified injury stage, unspecified laterality   [facility] Osvaldo Angst M, PA-C   10 months ago Urinary tract infection associated with catheterization of urinary tract, unspecified indwelling urinary catheter type, subsequent encounter   [facility] Chrismon, Jodell Cipro, PA-C   1 year ago Recurrent UTI   [facility] Chrismon, Jodell Cipro, [place]

## 2020-12-19 MED ORDER — SILVER SULFADIAZINE 1 % EX CREA
1.0000 | TOPICAL_CREAM | Freq: Two times a day (BID) | CUTANEOUS | 0 refills | Status: DC
Start: 2020-12-19 — End: 2021-01-18

## 2020-12-28 ENCOUNTER — Telehealth: Payer: Self-pay

## 2020-12-28 NOTE — Telephone Encounter (Signed)
Copied from CRM [id number]. Topic: General - Other >> Dec 28, 2020  8:19 AM Jaquita Rector A wrote: Reason for CRM: Patient called in asking Dr [name] to please send an Tx to his pharmacy for some eyedrops for allergies, say that he has stinging and itching of the eyes. Please call with questions and concerns  Ph# [phone]

## 2020-12-29 NOTE — Telephone Encounter (Signed)
Left message to call back. Ok for [facility] to advise patient as below.

## 2020-12-29 NOTE — Telephone Encounter (Signed)
Attempted to call patient back before end of day- left message to call office.

## 2020-12-29 NOTE — Telephone Encounter (Signed)
Allergy eye drops are now over the counter. I recommend OTC Pataday (olopatadine )

## 2020-12-30 DIAGNOSIS — R339 Retention of urine, unspecified: Secondary | ICD-10-CM | POA: Diagnosis not present

## 2020-12-30 NOTE — Telephone Encounter (Signed)
Patient advised as below.  

## 2020-12-30 NOTE — Telephone Encounter (Signed)
Patient called, left VM to return the call to the office for a message from Dr. Fisher.  

## 2021-01-04 ENCOUNTER — Other Ambulatory Visit: Payer: Self-pay | Admitting: Family Medicine

## 2021-01-04 DIAGNOSIS — G894 Chronic pain syndrome: Secondary | ICD-10-CM

## 2021-01-04 DIAGNOSIS — F988 Other specified behavioral and emotional disorders with onset usually occurring in childhood and adolescence: Secondary | ICD-10-CM

## 2021-01-04 NOTE — Telephone Encounter (Signed)
Requested medications are due for refill today.  yes  Requested medications are on the active medications list.  yes  Last refill. 12/09/2020  Future visit scheduled.   no  Notes to clinic.  All 3 medications are not delegated.

## 2021-01-05 ENCOUNTER — Other Ambulatory Visit: Payer: Self-pay | Admitting: Family Medicine

## 2021-01-05 DIAGNOSIS — G8220 Paraplegia, unspecified: Secondary | ICD-10-CM

## 2021-01-05 NOTE — Telephone Encounter (Signed)
Requested medication (s) are due for refill today: Yes  Requested medication (s) are on the active medication list: Yes  Last refill:  05/10/19  Future visit scheduled: No  Notes to clinic:  Prescription has expired.    Requested Prescriptions  Pending Prescriptions Disp Refills   baclofen (LIORESAL) 20 MG tablet [Pharmacy Med Name: BACLOFEN 20 MG TAB] 120 each 12    Sig: TAKE ONE TABLET FOUR TIMES DAILY      Not Delegated - Analgesics:  Muscle Relaxants Failed - 01/05/2021 11:49 AM      Failed - This refill cannot be delegated      Passed - Valid encounter within last 6 months    Recent Outpatient Visits           3 months ago Edema, unspecified type   [facility] Malva Limes, MD   5 months ago Dysuria   [facility] Chrismon, Jodell Cipro, PA-C   8 months ago Pressure injury of skin of buttock, unspecified injury stage, unspecified laterality   [facility] Osvaldo Angst M, PA-C   11 months ago Urinary tract infection associated with catheterization of urinary tract, unspecified indwelling urinary catheter type, subsequent encounter   [facility] Chrismon, Jodell Cipro, PA-C   1 year ago Recurrent UTI   [facility] Chrismon, Jodell Cipro, [place]

## 2021-01-05 NOTE — Telephone Encounter (Signed)
Requested medication (s) are due for refill today: yes  Requested medication (s) are on the active medication list: yes  Last refill: 3/216/21  Future visit scheduled: no  Notes to clinic:  not delegated    Requested Prescriptions  Pending Prescriptions Disp Refills   tiZANidine (ZANAFLEX) 4 MG tablet [Pharmacy Med Name: TIZANIDINE HCL 4 MG TAB] 240 tablet 3    Sig: TAKE 2 TABLETS BY MOUTH 4 TIMES DAILY      Not Delegated - Cardiovascular:  Alpha-2 Agonists - tizanidine Failed - 01/05/2021 11:48 AM      Failed - This refill cannot be delegated      Passed - Valid encounter within last 6 months    Recent Outpatient Visits           3 months ago Edema, unspecified type   [facility] Malva Limes, MD   5 months ago Dysuria   [facility] Chrismon, Jodell Cipro, PA-C   8 months ago Pressure injury of skin of buttock, unspecified injury stage, unspecified laterality   [facility] Osvaldo Angst M, PA-C   11 months ago Urinary tract infection associated with catheterization of urinary tract, unspecified indwelling urinary catheter type, subsequent encounter   [facility] Chrismon, Jodell Cipro, PA-C   1 year ago Recurrent UTI   [facility] Chrismon, Jodell Cipro, [place]

## 2021-01-18 ENCOUNTER — Other Ambulatory Visit: Payer: Self-pay | Admitting: Family Medicine

## 2021-01-18 DIAGNOSIS — B356 Tinea cruris: Secondary | ICD-10-CM

## 2021-01-18 MED ORDER — SILVER SULFADIAZINE 1 % EX CREA: 1.0000 | TOPICAL_CREAM | Freq: Two times a day (BID) | CUTANEOUS | 2 refills | Status: DC

## 2021-01-18 NOTE — Telephone Encounter (Signed)
Requested medication (s) are due for refill today: yes  Requested medication (s) are on the active medication list: yes  Last refill: 12/19/20  Future visit scheduled: yes  Notes to clinic:  no assigned protocol    Requested Prescriptions  Pending Prescriptions Disp Refills   silver sulfADIAZINE (SILVADENE) 1 % cream 50 g 0    Sig: Apply 1 application topically 2 (two) times daily.      Off-Protocol Failed - 01/18/2021 10:39 AM      Failed - Medication not assigned to a protocol, review manually.      Passed - Valid encounter within last 12 months    Recent Outpatient Visits           4 months ago Edema, unspecified type   [facility] Malva Limes, MD   5 months ago Dysuria   [facility] Chrismon, Jodell Cipro, PA-C   9 months ago Pressure injury of skin of buttock, unspecified injury stage, unspecified laterality   [facility] Osvaldo Angst M, [place]   11 months ago Urinary tract infection associated with catheterization of urinary tract, unspecified indwelling urinary catheter type, subsequent encounter   [facility] Chrismon, Jodell Cipro, PA-C   1 year ago Recurrent UTI   [facility] Chrismon, Jodell Cipro, PA-C       Future Appointments             In 1 week Fisher, Demetrios Isaacs, MD [facility]

## 2021-01-18 NOTE — Telephone Encounter (Signed)
Medication Refill - Medication: silver sulfADIAZINE (SILVADENE) 1 % cream    Preferred Pharmacy (with phone number or street name):  [facility] - [place], [place] [address] Phone:  [phone]  Fax:  [phone]       Agent: Please be advised that RX refills may take up to 3 business days. We ask that you follow-up with your pharmacy.

## 2021-01-27 DIAGNOSIS — R339 Retention of urine, unspecified: Secondary | ICD-10-CM | POA: Diagnosis not present

## 2021-01-31 ENCOUNTER — Telehealth (INDEPENDENT_AMBULATORY_CARE_PROVIDER_SITE_OTHER): Payer: Medicare HMO | Admitting: Family Medicine

## 2021-01-31 ENCOUNTER — Other Ambulatory Visit: Payer: Self-pay

## 2021-01-31 ENCOUNTER — Encounter: Payer: Self-pay | Admitting: Family Medicine

## 2021-01-31 DIAGNOSIS — H1013 Acute atopic conjunctivitis, bilateral: Secondary | ICD-10-CM

## 2021-01-31 DIAGNOSIS — L0390 Cellulitis, unspecified: Secondary | ICD-10-CM

## 2021-01-31 MED ORDER — SULFAMETHOXAZOLE-TRIMETHOPRIM 800-160 MG PO TABS: 2.0000 | ORAL_TABLET | Freq: Two times a day (BID) | ORAL | 0 refills | Status: AC

## 2021-01-31 MED ORDER — AZELASTINE HCL 0.05 % OP SOLN: 1.0000 [drp] | Freq: Two times a day (BID) | OPHTHALMIC | 4 refills | Status: DC

## 2021-01-31 NOTE — Progress Notes (Signed)
MyChart Video Visit    Virtual Visit via Video Note   This visit type was conducted due to national recommendations for restrictions regarding the COVID-19 Pandemic (e.g. social distancing) in an effort to limit this patient's exposure and mitigate transmission in our community. This patient is at least at moderate risk for complications without adequate follow up. This format is felt to be most appropriate for this patient at this time. Physical exam was limited by quality of the video and audio technology used for the visit.   Patient location: home Provider location: [facility]  I discussed the limitations of evaluation and management by telemedicine and the availability of in person appointments. The patient expressed understanding and agreed to proceed.  Patient: Justin [name]   DOB: 1981-02-13   40 y.o. Male  MRN: [id number] Visit Date: 01/31/2021  Today's healthcare provider: Mila Merry, MD   Chief Complaint  Patient presents with  . Wound Check   Subjective    HPI  Pressure wound: Patient complains of a pressure wound on the right side of his buttocks. The wound appeared 1-2 months ago. He states the  wound is healing well but still has a red rash around it. He has been using OTC antifungal itch cream along with silvadene cream.     Medications: Outpatient Medications Prior to Visit  Medication Sig  . albuterol (PROVENTIL HFA;VENTOLIN HFA) 108 (90 Base) MCG/ACT inhaler Inhale 2 puffs into the lungs every 6 (six) hours as needed for wheezing or shortness of breath.  . alprazolam (XANAX) 2 MG tablet TAKE ONE TABLET EVERY SIX HOURS IF NEEDED  . amphetamine-dextroamphetamine (ADDERALL) 15 MG tablet TAKE ONE TABLET BY MOUTH TWICE DAILY  . baclofen (LIORESAL) 20 MG tablet TAKE ONE TABLET FOUR TIMES DAILY  . clotrimazole (LOTRIMIN) 1 % cream Apply 1 application topically 2 (two) times daily.  . clotrimazole-betamethasone (LOTRISONE) cream Apply 1 application  topically 2 (two) times daily.  . diphenhydrAMINE (BENADRYL) 25 MG tablet Take 1 tablet by mouth as needed.  . docusate sodium (COLACE) 250 MG capsule Take 1 capsule (250 mg total) by mouth 4 (four) times daily as needed.  . doxycycline (VIBRA-TABS) 100 MG tablet Take 1 tablet (100 mg total) by mouth 2 (two) times daily.  Marland Kitchen dronabinol (MARINOL) 10 MG capsule Take 1 capsule (10 mg total) by mouth every 6 (six) hours as needed.  . flavoxATE (URISPAS) 100 MG tablet Take 1 tablet (100 mg total) by mouth 3 (three) times daily as needed for bladder spasms.  . fluticasone (FLONASE) 50 MCG/ACT nasal spray Place 2 sprays into both nostrils daily.  Marland Kitchen gabapentin (NEURONTIN) 800 MG tablet Take 1 tablet (800 mg total) by mouth 4 (four) times daily.  Marland Kitchen levothyroxine (SYNTHROID) 88 MCG tablet TAKE ONE TABLET BY MOUTH EVERY DAY  . lidocaine (LMX) 4 % cream Apply 1 application topically 3 (three) times daily.  Marland Kitchen LINZESS 145 MCG CAPS capsule TAKE 1 CAPSULE BY MOUTH DAILY BEFORE BREAKFAST.  . methadone (DOLOPHINE) 10 MG tablet TAKE TWO TABLETS BY MOUTH EVERY 6 HOURS  . Mineral Oil OIL daily.  . naproxen (NAPROSYN) 500 MG tablet TAKE ONE TABLET TWICE DAILY  . omeprazole (PRILOSEC) 20 MG capsule TAKE 1 CAPSULE BY MOUTH EVERY DAY  . oxyCODONE (ROXICODONE) 15 MG immediate release tablet TAKE ONE TABLET BY MOUTH EVERY 6 HOURS AS NEEDED FOR PAIN  . PARoxetine (PAXIL) 30 MG tablet TAKE 2 TABLETS BY MOUTH DAILY  . phenazopyridine (PYRIDIUM) 200 MG tablet  Take 1 tablet (200 mg total) by mouth 3 (three) times daily.  . polyethylene glycol powder (QC NATURA-LAX) 17 GM/SCOOP powder MIX 1 CAPFUL IN 8 0Z OF WATER OR JUICE AND DRINK ONCE A DAY  . promethazine (PHENERGAN) 25 MG suppository Place 1 suppository (25 mg total) rectally every 6 (six) hours as needed for nausea or vomiting.  . promethazine (PHENERGAN) 25 MG tablet TAKE 1 TABLET BY MOUTH EVERY 4 HOURS AS NEEDED FOR NAUSEA/VOMITING  . sildenafil (REVATIO) 20 MG tablet  Take 3 to 5 tablets two hours before intercouse on an empty stomach.  Do not take with nitrates.  . silver sulfADIAZINE (SILVADENE) 1 % cream Apply 1 application topically 2 (two) times daily.  Marland Kitchen tiZANidine (ZANAFLEX) 4 MG tablet TAKE 2 TABLETS BY MOUTH 4 TIMES DAILY  . TOVIAZ 8 MG TB24 tablet TAKE 1 TABLET BY MOUTH DAILY  . VESICARE 10 MG tablet TAKE 1 TABLET BY MOUTH DAILY   Facility-Administered Medications Prior to Visit  Medication Dose Route Frequency Provider  . methylPREDNISolone acetate (DEPO-MEDROL) injection 40 mg  40 mg Intramuscular Once Malva Limes, MD    Review of Systems  Constitutional: Negative for appetite change, chills and fever.  Eyes: Positive for itching.  Respiratory: Negative for chest tightness, shortness of breath and wheezing.   Cardiovascular: Negative for chest pain and palpitations.  Gastrointestinal: Negative for abdominal pain, nausea and vomiting.  Skin: Positive for color change and wound.      Objective    There were no vitals taken for this visit.   Physical Exam   By video about 4-5 cm erythematous patch with central shallow erosion with no apparent drainage.    Assessment & Plan     1. Cellulitis, unspecified cellulitis site Mostly healed pressure wound, but appears to have cellulitis around original wound site.  - sulfamethoxazole-trimethoprim (BACTRIM DS) 800-160 MG tablet; Take 2 tablets by mouth 2 (two) times daily for 10 days.  Dispense: 40 tablet; Refill: 0  2. Allergic conjunctivitis of both eyes He has been having itchy watery eyes this allergy season and tried OTC allergy eye drops which have not been effective.   - azelastine (OPTIVAR) 0.05 % ophthalmic solution; Place 1 drop into both eyes 2 (two) times daily.  Dispense: 6 mL; Refill: 4        I discussed the assessment and treatment plan with the patient. The patient was provided an opportunity to ask questions and all were answered. The patient agreed with the plan  and demonstrated an understanding of the instructions.   The patient was advised to call back or seek an in-person evaluation if the symptoms worsen or if the condition fails to improve as anticipated.  I provided 10 minutes of non-face-to-face time during this encounter.  The entirety of the information documented in the History of Present Illness, Review of Systems and Physical Exam were personally obtained by me. Portions of this information were initially documented by the CMA and reviewed by me for thoroughness and accuracy.     Mila Merry, MD [facility] [phone] (phone) [phone] (fax)  [facility]

## 2021-02-02 ENCOUNTER — Other Ambulatory Visit: Payer: Self-pay | Admitting: Family Medicine

## 2021-02-02 DIAGNOSIS — G894 Chronic pain syndrome: Secondary | ICD-10-CM

## 2021-02-02 NOTE — Telephone Encounter (Signed)
Requested medications are due for refill today.  yes  Requested medications are on the active medications list.  yes  Last refill. 12/2020  Future visit scheduled.   no  Notes to clinic.  Medications not delegated.

## 2021-02-03 ENCOUNTER — Other Ambulatory Visit: Payer: Self-pay | Admitting: Family Medicine

## 2021-02-03 NOTE — Telephone Encounter (Signed)
Requested medications are due for refill today.  yes  Requested medications are on the active medications list.  yes  Last refill. 10/28/2020  Future visit scheduled.   no  Notes to clinic.  Medication not delegated.

## 2021-02-07 ENCOUNTER — Other Ambulatory Visit: Payer: Self-pay | Admitting: Family Medicine

## 2021-02-07 DIAGNOSIS — G894 Chronic pain syndrome: Secondary | ICD-10-CM

## 2021-02-07 NOTE — Telephone Encounter (Signed)
Requested medication (s) are due for refill today:   No just prescribed on 520/2022  Requested medication (s) are on the active medication list:   Yes  Future visit scheduled:   No just seen a week ago by Dr. [name]   Last ordered: 02/03/2021 #240, 0 refills  Non delegated refill    Requested Prescriptions  Pending Prescriptions Disp Refills   methadone (DOLOPHINE) 10 MG tablet [Pharmacy Med Name: METHADONE HCL 10 MG TAB] 240 tablet     Sig: TAKE TWO TABLETS BY MOUTH EVERY 6 HOURS      Not Delegated - Analgesics:  Opioid Agonists Failed - 02/07/2021  8:06 AM      Failed - This refill cannot be delegated      Failed - Urine Drug Screen completed in last 360 days      Passed - Valid encounter within last 6 months    Recent Outpatient Visits           1 week ago Cellulitis, unspecified cellulitis site   [facility] Malva Limes, MD   4 months ago Edema, unspecified type   [facility] Malva Limes, MD   6 months ago Dysuria   [facility] Chrismon, Jodell Cipro, PA-C   9 months ago Pressure injury of skin of buttock, unspecified injury stage, unspecified laterality   [facility] [place], Adriana M, PA-C   1 year ago Urinary tract infection associated with catheterization of urinary tract, unspecified indwelling urinary catheter type, subsequent encounter   [facility] Chrismon, Jodell Cipro, PA-C                  oxyCODONE (ROXICODONE) 15 MG immediate release tablet [Pharmacy Med Name: OXYCODONE HCL 15 MG TAB] 120 tablet     Sig: TAKE ONE TABLET BY MOUTH EVERY 6 HOURS AS NEEDED FOR PAIN      Not Delegated - Analgesics:  Opioid Agonists Failed - 02/07/2021  8:06 AM      Failed - This refill cannot be delegated      Failed - Urine Drug Screen completed in last 360 days      Passed - Valid encounter within last 6 months    Recent Outpatient Visits           1 week ago Cellulitis, unspecified  cellulitis site   [facility] Malva Limes, MD   4 months ago Edema, unspecified type   [facility] Malva Limes, MD   6 months ago Dysuria   [facility] Chrismon, Jodell Cipro, PA-C   9 months ago Pressure injury of skin of buttock, unspecified injury stage, unspecified laterality   [facility], [place], PA-C   1 year ago Urinary tract infection associated with catheterization of urinary tract, unspecified indwelling urinary catheter type, subsequent encounter   [facility] Chrismon, Jodell Cipro, [place]

## 2021-02-15 ENCOUNTER — Encounter: Payer: Self-pay | Admitting: Family Medicine

## 2021-02-15 ENCOUNTER — Ambulatory Visit: Payer: Self-pay

## 2021-02-15 DIAGNOSIS — S24109S Unspecified injury at unspecified level of thoracic spinal cord, sequela: Secondary | ICD-10-CM

## 2021-02-15 DIAGNOSIS — S99929A Unspecified injury of unspecified foot, initial encounter: Secondary | ICD-10-CM

## 2021-02-15 DIAGNOSIS — M21969 Unspecified acquired deformity of unspecified lower leg: Secondary | ICD-10-CM

## 2021-02-15 NOTE — Telephone Encounter (Signed)
Pt. Noticed this morning 2 bumps tp right middle toe. He is afraid he has broken the toe. Paraplegic and has no feeling in legs. No bruising. No availability for virtual visit. This week per Victorino Dike in the practice. She suggests pt. Go to UC. Pt. Declines. States his immune system is compromised. Requesting an order for foot x-ray. Please advise pt. Answer Assessment - Initial Assessment Questions 1. MECHANISM: "How did the injury happen?"      Unsure 2. ONSET: "When did the injury happen?" (Minutes or hours ago)      Last night 3. LOCATION: "What part of the toe is injured?" "Is the nail damaged?"      Right foot - middle toe 4. APPEARANCE of TOE INJURY: "What does the injury look like?"      2 bumps no bruising 5. SEVERITY: "Can you use the foot normally?" "Can you walk?"      No 6. SIZE: For cuts, bruises, or swelling, ask: "How large is it?" (e.g., inches or centimeters;  entire toe)      Small 7. PAIN: "Is there pain?" If Yes, ask: "How bad is the pain?"   (e.g., Scale 1-10; or mild, moderate, severe)     No 8. TETANUS: For any breaks in the skin, ask: "When was the last tetanus booster?"     No 9. DIABETES: "Do you have a history of diabetes or poor circulation in the feet?"     No 10. OTHER SYMPTOMS: "Do you have any other symptoms?"        No 11. PREGNANCY: "Is there any chance you are pregnant?" "When was your last menstrual period?"       n/a  Protocols used: TOE INJURY-A-AH

## 2021-02-15 NOTE — Telephone Encounter (Signed)
Patient is requesting an order for an x ray of the middle toe on his right foot. There are no available appointments today and refuses to go to urgent care. Patient is aware that Dr. [name] is out of the office until Friday 02/17/2021 and is fine with waiting for a response. Please advise.

## 2021-02-16 ENCOUNTER — Encounter: Payer: Self-pay | Admitting: Family Medicine

## 2021-02-16 NOTE — Telephone Encounter (Signed)
Recommend referral to podiatry.

## 2021-02-17 NOTE — Addendum Note (Signed)
Addended by: Malva Limes on: 02/17/2021 01:36 PM   Modules accepted: Orders

## 2021-02-17 NOTE — Telephone Encounter (Signed)
He needs referral to podiatry. Irregardless of what an x-ray shows, this is something that needs to be managed by a podiatrist.

## 2021-02-17 NOTE — Telephone Encounter (Signed)
Pt agreed to the podiatry referral.     Thanks,   -Vernona Rieger

## 2021-02-17 NOTE — Addendum Note (Signed)
Addended by: Kavin Leech E on: 02/17/2021 09:48 AM   Modules accepted: Orders

## 2021-02-21 ENCOUNTER — Encounter: Payer: Self-pay | Admitting: Family Medicine

## 2021-02-28 ENCOUNTER — Encounter: Payer: Self-pay | Admitting: Family Medicine

## 2021-02-28 NOTE — Telephone Encounter (Signed)
Pt had an U/S scheduled for 09/2020 and he had to cancel.  He would like to get it rescheduled now.  Does he need another referral?  Also he needs a Dr's note approving tinting for his car.  He says sometime he has to cath in his car and needs his windows tinted so no one can look in.       Thanks,   -Vernona Rieger

## 2021-02-28 NOTE — Telephone Encounter (Signed)
Pt is checking on this referral.  Has it been completed yet?   Thanks,   -Vernona Rieger

## 2021-02-28 NOTE — Progress Notes (Signed)
Received fax from [facility] patient is being prescribed oxybutynin by urology Mellody Life) which is duplicate therapy with Toviaz. D/c toviaz from patient's medication list.

## 2021-03-01 DIAGNOSIS — R339 Retention of urine, unspecified: Secondary | ICD-10-CM | POA: Diagnosis not present

## 2021-03-06 ENCOUNTER — Other Ambulatory Visit: Payer: Self-pay | Admitting: Family Medicine

## 2021-03-13 ENCOUNTER — Other Ambulatory Visit: Payer: Self-pay | Admitting: Family Medicine

## 2021-03-13 DIAGNOSIS — B356 Tinea cruris: Secondary | ICD-10-CM

## 2021-03-25 ENCOUNTER — Encounter: Payer: Self-pay | Admitting: Family Medicine

## 2021-03-25 DIAGNOSIS — B356 Tinea cruris: Secondary | ICD-10-CM

## 2021-03-28 DIAGNOSIS — R339 Retention of urine, unspecified: Secondary | ICD-10-CM | POA: Diagnosis not present

## 2021-03-28 MED ORDER — SILVER SULFADIAZINE 1 % EX CREA: TOPICAL_CREAM | CUTANEOUS | 2 refills | Status: DC

## 2021-04-03 ENCOUNTER — Other Ambulatory Visit: Payer: Self-pay | Admitting: Family Medicine

## 2021-04-03 DIAGNOSIS — G894 Chronic pain syndrome: Secondary | ICD-10-CM

## 2021-04-03 NOTE — Telephone Encounter (Signed)
Requested medications are due for refill today.  yes  Requested medications are on the active medications list.  yes  Last refill. 02/07/2021  Future visit scheduled.   no  Notes to clinic.  2 of the medications are not delegated.  Patient needs Tsh as last result was abnormal.

## 2021-04-24 ENCOUNTER — Telehealth: Payer: Self-pay

## 2021-04-24 NOTE — Telephone Encounter (Signed)
Copied from CRM [id number]. Topic: General - Other >> Apr 24, 2021 12:57 PM Jaquita Rector A wrote: Reason for CRM: Patient called in to inform Dr [name] that he need to come by and drop off a urine sample because he have a UTI and asking for a call back please Ph# [phone]

## 2021-04-25 NOTE — Telephone Encounter (Signed)
That's fine

## 2021-04-28 DIAGNOSIS — R339 Retention of urine, unspecified: Secondary | ICD-10-CM | POA: Diagnosis not present

## 2021-05-01 ENCOUNTER — Other Ambulatory Visit: Payer: Self-pay

## 2021-05-01 DIAGNOSIS — R300 Dysuria: Secondary | ICD-10-CM

## 2021-05-01 NOTE — Progress Notes (Signed)
Urine Cx ordered per Dr [name].

## 2021-05-01 NOTE — Telephone Encounter (Signed)
patient called in says wife will drop off sample today

## 2021-05-04 ENCOUNTER — Other Ambulatory Visit: Payer: Self-pay | Admitting: Family Medicine

## 2021-05-04 ENCOUNTER — Encounter: Payer: Self-pay | Admitting: Family Medicine

## 2021-05-04 DIAGNOSIS — G894 Chronic pain syndrome: Secondary | ICD-10-CM

## 2021-05-04 LAB — URINE CULTURE

## 2021-05-04 NOTE — Telephone Encounter (Signed)
Requested medication (s) are due for refill today: yes  Requested medication (s) are on the active medication list: yes  Last refill:  04/04/2021  Future visit scheduled: no  Notes to clinic:  This refill cannot be delegated  Requested Prescriptions  Pending Prescriptions Disp Refills   oxyCODONE (ROXICODONE) 15 MG immediate release tablet [Pharmacy Med Name: OXYCODONE HCL 15 MG TAB] 120 tablet     Sig: TAKE ONE TABLET BY MOUTH EVERY 6 HOURS AS NEEDED FOR PAIN     Not Delegated - Analgesics:  Opioid Agonists Failed - 05/04/2021 10:21 AM      Failed - This refill cannot be delegated      Failed - Urine Drug Screen completed in last 360 days      Passed - Valid encounter within last 6 months    Recent Outpatient Visits           3 months ago Cellulitis, unspecified cellulitis site   [facility] Malva Limes, MD   7 months ago Edema, unspecified type   [facility] Malva Limes, MD   9 months ago Dysuria   [facility] Chrismon, Jodell Cipro, PA-C   1 year ago Pressure injury of skin of buttock, unspecified injury stage, unspecified laterality   [facility] [place], Adriana M, PA-C   1 year ago Urinary tract infection associated with catheterization of urinary tract, unspecified indwelling urinary catheter type, subsequent encounter   [facility] Chrismon, Jodell Cipro, PA-C               methadone (DOLOPHINE) 10 MG tablet [Pharmacy Med Name: METHADONE HCL 10 MG TAB] 240 tablet     Sig: TAKE TWO TABLETS BY MOUTH EVERY 6 HOURS     Not Delegated - Analgesics:  Opioid Agonists Failed - 05/04/2021 10:21 AM      Failed - This refill cannot be delegated      Failed - Urine Drug Screen completed in last 360 days      Passed - Valid encounter within last 6 months    Recent Outpatient Visits           3 months ago Cellulitis, unspecified cellulitis site   [facility] Malva Limes, MD    7 months ago Edema, unspecified type   [facility] Malva Limes, MD   9 months ago Dysuria   [facility] Chrismon, Jodell Cipro, PA-C   1 year ago Pressure injury of skin of buttock, unspecified injury stage, unspecified laterality   [facility] [place], Adriana M, PA-C   1 year ago Urinary tract infection associated with catheterization of urinary tract, unspecified indwelling urinary catheter type, subsequent encounter   [facility] Chrismon, Jodell Cipro, PA-C               LINZESS 145 MCG CAPS capsule [Pharmacy Med Name: LINZESS 145 MCG CAP] 90 capsule 1    Sig: TAKE 1 CAPSULE BY MOUTH DAILY BEFORE BREAKFAST.     Gastroenterology: Irritable Bowel Syndrome Passed - 05/04/2021 10:21 AM      Passed - Valid encounter within last 12 months    Recent Outpatient Visits           3 months ago Cellulitis, unspecified cellulitis site   [facility] Malva Limes, MD   7 months ago Edema, unspecified type   [facility] Malva Limes, MD   9 months ago Dysuria  [facility] Chrismon, Jodell Cipro, PA-C   1 year ago Pressure injury of skin of buttock, unspecified injury stage, unspecified laterality   [facility] [place], [place]   1 year ago Urinary tract infection associated with catheterization of urinary tract, unspecified indwelling urinary catheter type, subsequent encounter   [facility] Chrismon, Jodell Cipro, [place]

## 2021-05-05 ENCOUNTER — Encounter: Payer: Self-pay | Admitting: Family Medicine

## 2021-05-05 ENCOUNTER — Other Ambulatory Visit: Payer: Self-pay | Admitting: Family Medicine

## 2021-05-05 DIAGNOSIS — S24109S Unspecified injury at unspecified level of thoracic spinal cord, sequela: Secondary | ICD-10-CM

## 2021-05-05 DIAGNOSIS — N390 Urinary tract infection, site not specified: Secondary | ICD-10-CM

## 2021-05-05 DIAGNOSIS — L89309 Pressure ulcer of unspecified buttock, unspecified stage: Secondary | ICD-10-CM

## 2021-05-05 MED ORDER — CEFDINIR 300 MG PO CAPS: 600.0000 mg | ORAL_CAPSULE | Freq: Every day | ORAL | 0 refills | Status: DC

## 2021-05-05 NOTE — Telephone Encounter (Signed)
I can not Rx this. I gave antibiotic in last message. Justin [name] can decide when he is back if this is something he wants to do in the future

## 2021-05-05 NOTE — Telephone Encounter (Signed)
Reviewed his urine culture.  It does not look like anyone has reviewed this or treated it yet.  Please send in Bactrim DS 1 tab twice daily x7 days.

## 2021-05-23 ENCOUNTER — Ambulatory Visit: Payer: Medicare HMO | Admitting: Physician Assistant

## 2021-05-30 ENCOUNTER — Encounter: Payer: Self-pay | Admitting: Family Medicine

## 2021-05-31 ENCOUNTER — Other Ambulatory Visit: Payer: Self-pay | Admitting: Family Medicine

## 2021-05-31 DIAGNOSIS — N390 Urinary tract infection, site not specified: Secondary | ICD-10-CM

## 2021-05-31 DIAGNOSIS — G894 Chronic pain syndrome: Secondary | ICD-10-CM

## 2021-05-31 DIAGNOSIS — F988 Other specified behavioral and emotional disorders with onset usually occurring in childhood and adolescence: Secondary | ICD-10-CM

## 2021-05-31 NOTE — Telephone Encounter (Signed)
Requested medication (s) are due for refill today yes  Requested medication (s) are on the active medication list -yes  Future visit scheduled -no  Last refill: oxycodone, methadone, omnicef- 05/05/21                  Adderall-01/05/21  Notes to clinic: Request RF: non delegated Rx, not assigned protocol  Requested Prescriptions  Pending Prescriptions Disp Refills   oxyCODONE (ROXICODONE) 15 MG immediate release tablet [Pharmacy Med Name: OXYCODONE HCL 15 MG TAB] 120 tablet     Sig: TAKE ONE TABLET BY MOUTH EVERY 6 HOURS AS NEEDED FOR PAIN     Not Delegated - Analgesics:  Opioid Agonists Failed - 05/31/2021  1:34 PM      Failed - This refill cannot be delegated      Failed - Urine Drug Screen completed in last 360 days      Passed - Valid encounter within last 6 months    Recent Outpatient Visits           4 months ago Cellulitis, unspecified cellulitis site   [facility] Malva Limes, MD   8 months ago Edema, unspecified type   [facility] Malva Limes, MD   10 months ago Dysuria   [facility] Chrismon, Jodell Cipro, PA-C   1 year ago Pressure injury of skin of buttock, unspecified injury stage, unspecified laterality   [facility] [place], Adriana M, PA-C   1 year ago Urinary tract infection associated with catheterization of urinary tract, unspecified indwelling urinary catheter type, subsequent encounter   [facility] Chrismon, Jodell Cipro, PA-C               methadone (DOLOPHINE) 10 MG tablet [Pharmacy Med Name: METHADONE HCL 10 MG TAB] 240 tablet     Sig: TAKE TWO TABLETS BY MOUTH EVERY 6 HOURS     Not Delegated - Analgesics:  Opioid Agonists Failed - 05/31/2021  1:34 PM      Failed - This refill cannot be delegated      Failed - Urine Drug Screen completed in last 360 days      Passed - Valid encounter within last 6 months    Recent Outpatient Visits           4 months ago Cellulitis,  unspecified cellulitis site   [facility] Malva Limes, MD   8 months ago Edema, unspecified type   [facility] Malva Limes, MD   10 months ago Dysuria   [facility] Chrismon, Jodell Cipro, PA-C   1 year ago Pressure injury of skin of buttock, unspecified injury stage, unspecified laterality   [facility] [place], Adriana M, PA-C   1 year ago Urinary tract infection associated with catheterization of urinary tract, unspecified indwelling urinary catheter type, subsequent encounter   [facility] Chrismon, Jodell Cipro, PA-C               cefdinir (OMNICEF) 300 MG capsule [Pharmacy Med Name: CEFDINIR 300 MG CAP] 14 capsule 0    Sig: TAKE 2 CAPSULES BY MOUTH DAILY FOR 7 DAYS     Off-Protocol Failed - 05/31/2021  1:34 PM      Failed - Medication not assigned to a protocol, review manually.      Passed - Valid encounter within last 12 months    Recent Outpatient Visits           4 months  ago Cellulitis, unspecified cellulitis site   [facility] Malva Limes, MD   8 months ago Edema, unspecified type   [facility] Malva Limes, MD   10 months ago Dysuria   [facility] Chrismon, Jodell Cipro, PA-C   1 year ago Pressure injury of skin of buttock, unspecified injury stage, unspecified laterality   [facility] [place], Adriana M, PA-C   1 year ago Urinary tract infection associated with catheterization of urinary tract, unspecified indwelling urinary catheter type, subsequent encounter   [facility] Chrismon, Jodell Cipro, PA-C               amphetamine-dextroamphetamine (ADDERALL) 15 MG tablet [Pharmacy Med Name: AMPHETAMINE-DEXTROAMPHETAMINE 15 MG] 60 tablet     Sig: TAKE ONE TABLET TWICE DAILY     Not Delegated - Psychiatry:  Stimulants/ADHD Failed - 05/31/2021  1:34 PM      Failed - This refill cannot be delegated      Failed  - Urine Drug Screen completed in last 360 days      Failed - Valid encounter within last 3 months    Recent Outpatient Visits           4 months ago Cellulitis, unspecified cellulitis site   [facility] Malva Limes, MD   8 months ago Edema, unspecified type   [facility] Malva Limes, MD   10 months ago Dysuria   [facility] Chrismon, Jodell Cipro, PA-C   1 year ago Pressure injury of skin of buttock, unspecified injury stage, unspecified laterality   [facility] [place], Adriana M, PA-C   1 year ago Urinary tract infection associated with catheterization of urinary tract, unspecified indwelling urinary catheter type, subsequent encounter   [facility] Chrismon, Jodell Cipro, PA-C                 Requested Prescriptions  Pending Prescriptions Disp Refills   oxyCODONE (ROXICODONE) 15 MG immediate release tablet [Pharmacy Med Name: OXYCODONE HCL 15 MG TAB] 120 tablet     Sig: TAKE ONE TABLET BY MOUTH EVERY 6 HOURS AS NEEDED FOR PAIN     Not Delegated - Analgesics:  Opioid Agonists Failed - 05/31/2021  1:34 PM      Failed - This refill cannot be delegated      Failed - Urine Drug Screen completed in last 360 days      Passed - Valid encounter within last 6 months    Recent Outpatient Visits           4 months ago Cellulitis, unspecified cellulitis site   [facility] Malva Limes, MD   8 months ago Edema, unspecified type   [facility] Malva Limes, MD   10 months ago Dysuria   [facility] Chrismon, Jodell Cipro, PA-C   1 year ago Pressure injury of skin of buttock, unspecified injury stage, unspecified laterality   [facility] [place], Adriana M, PA-C   1 year ago Urinary tract infection associated with catheterization of urinary tract, unspecified indwelling urinary catheter type, subsequent encounter   [facility]  Chrismon, Jodell Cipro, PA-C               methadone (DOLOPHINE) 10 MG tablet [Pharmacy Med Name: METHADONE HCL 10 MG TAB] 240 tablet     Sig: TAKE TWO TABLETS BY MOUTH EVERY 6 HOURS     Not Delegated - Analgesics:  Opioid Agonists Failed - 05/31/2021  1:34 PM      Failed - This refill cannot be delegated      Failed - Urine Drug Screen completed in last 360 days      Passed - Valid encounter within last 6 months    Recent Outpatient Visits           4 months ago Cellulitis, unspecified cellulitis site   [facility] Malva Limes, MD   8 months ago Edema, unspecified type   [facility] Malva Limes, MD   10 months ago Dysuria   [facility] Chrismon, Jodell Cipro, PA-C   1 year ago Pressure injury of skin of buttock, unspecified injury stage, unspecified laterality   [facility] [place], Adriana M, PA-C   1 year ago Urinary tract infection associated with catheterization of urinary tract, unspecified indwelling urinary catheter type, subsequent encounter   [facility] Chrismon, Jodell Cipro, PA-C               cefdinir (OMNICEF) 300 MG capsule [Pharmacy Med Name: CEFDINIR 300 MG CAP] 14 capsule 0    Sig: TAKE 2 CAPSULES BY MOUTH DAILY FOR 7 DAYS     Off-Protocol Failed - 05/31/2021  1:34 PM      Failed - Medication not assigned to a protocol, review manually.      Passed - Valid encounter within last 12 months    Recent Outpatient Visits           4 months ago Cellulitis, unspecified cellulitis site   [facility] Malva Limes, MD   8 months ago Edema, unspecified type   [facility] Malva Limes, MD   10 months ago Dysuria   [facility] Chrismon, Jodell Cipro, PA-C   1 year ago Pressure injury of skin of buttock, unspecified injury stage, unspecified laterality   [facility] [place], Adriana M, PA-C   1 year ago Urinary tract  infection associated with catheterization of urinary tract, unspecified indwelling urinary catheter type, subsequent encounter   [facility] Chrismon, Jodell Cipro, PA-C               amphetamine-dextroamphetamine (ADDERALL) 15 MG tablet [Pharmacy Med Name: AMPHETAMINE-DEXTROAMPHETAMINE 15 MG] 60 tablet     Sig: TAKE ONE TABLET TWICE DAILY     Not Delegated - Psychiatry:  Stimulants/ADHD Failed - 05/31/2021  1:34 PM      Failed - This refill cannot be delegated      Failed - Urine Drug Screen completed in last 360 days      Failed - Valid encounter within last 3 months    Recent Outpatient Visits           4 months ago Cellulitis, unspecified cellulitis site   [facility] Malva Limes, MD   8 months ago Edema, unspecified type   [facility] Malva Limes, MD   10 months ago Dysuria   [facility] Chrismon, Jodell Cipro, PA-C   1 year ago Pressure injury of skin of buttock, unspecified injury stage, unspecified laterality   [facility] [place], [place], PA-C   1 year ago Urinary tract infection associated with catheterization of urinary tract, unspecified indwelling urinary catheter type, subsequent encounter   [facility] Chrismon, Jodell Cipro, [place]

## 2021-06-02 ENCOUNTER — Telehealth: Payer: Self-pay

## 2021-06-02 NOTE — Telephone Encounter (Signed)
Copied from CRM [id number]. Topic: Appointment Scheduling - Scheduling Inquiry for Clinic >> Jun 02, 2021 11:29 AM Leafy Ro wrote: Reason for CRM: Pt is calling and would like to have his wife drop off urine sample to check for bladder infection. Pt is having burning and foul odor to his urine. Pt states sometime the office let his wife drop off urine and call in abx etc. Pt also request to see dr [name] on 9-26 or 9-27 to discuss getting a referral to GI specialist and blood work to check his tsh. Pt is having an issue with his phone if pt does not answer please call again. Please advise

## 2021-06-02 NOTE — Telephone Encounter (Signed)
See telephone encounter   Thanks,   -Subrena Devereux  

## 2021-06-02 NOTE — Telephone Encounter (Signed)
He can have 1pm slot on 06-12-2021. Needs to be a 40 minute appt.

## 2021-06-02 NOTE — Telephone Encounter (Signed)
Is it okay to use one of you same day on 06/13/2021

## 2021-06-02 NOTE — Telephone Encounter (Signed)
Pt has been scheduled.  I sent him a MyChart message advising him of his appointment.     Thanks,   -Vernona Rieger

## 2021-06-06 ENCOUNTER — Encounter: Payer: Medicare HMO | Attending: Physician Assistant | Admitting: Physician Assistant

## 2021-06-06 ENCOUNTER — Other Ambulatory Visit: Payer: Self-pay

## 2021-06-06 DIAGNOSIS — Z885 Allergy status to narcotic agent status: Secondary | ICD-10-CM | POA: Diagnosis not present

## 2021-06-06 DIAGNOSIS — Z881 Allergy status to other antibiotic agents status: Secondary | ICD-10-CM | POA: Diagnosis not present

## 2021-06-06 DIAGNOSIS — G8222 Paraplegia, incomplete: Secondary | ICD-10-CM | POA: Diagnosis not present

## 2021-06-06 DIAGNOSIS — L89312 Pressure ulcer of right buttock, stage 2: Secondary | ICD-10-CM | POA: Diagnosis not present

## 2021-06-06 NOTE — Progress Notes (Signed)
VICTOR, LANGENBACH ([id number]) Visit Report for 06/06/2021 Abuse/Suicide Risk Screen Details Patient Name: Justin [name], Justin [name] Date of Service: 06/06/2021 12:45 PM Medical Record Number: [id number] Patient Account Number: [id number] Date of Birth/Sex: Jan 19, 1981 (40 y.o. M) Treating RN: Rogers Blocker Primary Care Linzie Criss: Mila Merry Other Clinician: Referring Tenea Sens: Mila Merry Treating Lilac Hoff/Extender: Rowan Blase in Treatment: 0 Abuse/Suicide Risk Screen Items Answer ABUSE RISK SCREEN: Has anyone close to you tried to hurt or harm you recentlyo No Do you feel uncomfortable with anyone in your familyo No Has anyone forced you do things that you didnot want to doo No Electronic Signature(s) Signed: 06/06/2021 4:47:24 PM By: Rogers Blocker RN Entered By: Rogers Blocker on 06/06/2021 13:31:57 Justin [name] ([id number]) -------------------------------------------------------------------------------- Activities of Daily Living Details Patient Name: Justin [name] Date of Service: 06/06/2021 12:45 PM Medical Record Number: [id number] Patient Account Number: [id number] Date of Birth/Sex: 10-Feb-1981 (40 y.o. M) Treating RN: Rogers Blocker Primary Care Paton Crum: Mila Merry Other Clinician: Referring Elexis Pollak: Mila Merry Treating Georgi Navarrete/Extender: Rowan Blase in Treatment: 0 Activities of Daily Living Items Answer Activities of Daily Living (Please select one for each item) Drive Automobile Not Able Take Medications Completely Able Use Telephone Completely Able Care for Appearance Completely Able Use Toilet Completely Able Bath / Shower Completely Able Dress Self Completely Able Feed Self Completely Able Walk Completely Able Get In / Out Bed Completely Able Housework Completely Able Prepare Meals Completely Able Handle Money Completely Able Shop for Self Completely Able Electronic Signature(s) Signed: 06/06/2021 4:47:24 PM By: Rogers Blocker RN Entered By: Rogers Blocker on 06/06/2021 13:32:18 Justin [name], Justin [name] ([id number]) -------------------------------------------------------------------------------- Education Screening Details Patient Name: Justin [name] Date of Service: 06/06/2021 12:45 PM Medical Record Number: [id number] Patient Account Number: [id number] Date of Birth/Sex: 1980/11/18 (40 y.o. M) Treating RN: Rogers Blocker Primary Care Sequoya Hogsett: Mila Merry Other Clinician: Referring Yanely Mast: Mila Merry Treating Charleston Vierling/Extender: Rowan Blase in Treatment: 0 Primary Learner Assessed: Patient Learning Preferences/Education Level/Primary Language Learning Preference: Explanation, Demonstration Preferred Language: English Cognitive Barrier Language Barrier: No Translator Needed: No Memory Deficit: No Emotional Barrier: No Physical Barrier Impaired Vision: No Impaired Hearing: No Decreased Hand dexterity: No Knowledge/Comprehension Knowledge Level: Medium Comprehension Level: Medium Ability to understand written instructions: Medium Ability to understand verbal instructions: Medium Motivation Anxiety Level: Calm Cooperation: Cooperative Education Importance: Acknowledges Need Interest in Health Problems: Asks Questions Perception: Coherent Willingness to Engage in Self-Management Medium Activities: Readiness to Engage in Self-Management Medium Activities: Electronic Signature(s) Signed: 06/06/2021 4:47:24 PM By: Rogers Blocker RN Entered By: Rogers Blocker on 06/06/2021 13:32:33 Justin [name], Justin [name] ([id number]) -------------------------------------------------------------------------------- Fall Risk Assessment Details Patient Name: Justin [name] Date of Service: 06/06/2021 12:45 PM Medical Record Number: [id number] Patient Account Number: [id number] Date of Birth/Sex: [birth year] (40 y.o. M) Treating RN: Rogers Blocker Primary Care Deaveon Schoen: Mila Merry Other  Clinician: Referring Masayuki Sakai: Mila Merry Treating Elenna Spratling/Extender: Rowan Blase in Treatment: 0 Fall Risk Assessment Items Have you had 2 or more falls in the last 12 monthso 0 No Have you had any fall that resulted in injury in the last 12 monthso 0 No FALLS RISK SCREEN History of falling - immediate or within 3 months 0 No Secondary diagnosis (Do you have 2 or more medical diagnoseso) 15 Yes Ambulatory aid None/bed rest/wheelchair/nurse 0 Yes Crutches/cane/walker 0 No Furniture 0 No Intravenous therapy Access/Saline/Heparin Lock 0 No Gait/Transferring Normal/ bed rest/ wheelchair 0 Yes Weak (short steps with or without shuffle, stooped but  able to lift head while walking, may 0 No seek support from furniture) Impaired (short steps with shuffle, may have difficulty arising from chair, head down, impaired 0 No balance) Mental Status Oriented to own ability 0 Yes Electronic Signature(s) Signed: 06/06/2021 4:47:24 PM By: Rogers Blocker RN Entered By: Rogers Blocker on 06/06/2021 13:32:44 Justin [name], Justin [name] ([id number]) -------------------------------------------------------------------------------- Foot Assessment Details Patient Name: Justin [name] Date of Service: 06/06/2021 12:45 PM Medical Record Number: [id number] Patient Account Number: [id number] Date of Birth/Sex: [birth year] (40 y.o. M) Treating RN: Rogers Blocker Primary Care Viki Carrera: Mila Merry Other Clinician: Referring Tressa Maldonado: Mila Merry Treating Velna Hedgecock/Extender: Rowan Blase in Treatment: 0 Foot Assessment Items Site Locations + = Sensation present, - = Sensation absent, C = Callus, U = Ulcer R = Redness, W = Warmth, M = Maceration, PU = Pre-ulcerative lesion F = Fissure, S = Swelling, D = Dryness Assessment Right: Left: Other Deformity: No No Prior Foot Ulcer: No No Prior Amputation: No No Charcot Joint: No No Ambulatory Status: Non-ambulatory Assistance Device:  Wheelchair Gait: Electronic Signature(s) Signed: 06/06/2021 4:47:24 PM By: Rogers Blocker RN Entered By: Rogers Blocker on 06/06/2021 13:33:15 Justin [name], Justin [name] ([id number]) -------------------------------------------------------------------------------- Nutrition Risk Screening Details Patient Name: Justin [name] Date of Service: 06/06/2021 12:45 PM Medical Record Number: [id number] Patient Account Number: [id number] Date of Birth/Sex: 1981-08-03 (40 y.o. M) Treating RN: Rogers Blocker Primary Care Dhara Schepp: Mila Merry Other Clinician: Referring Angelea Penny: Mila Merry Treating Vanshika Jastrzebski/Extender: Rowan Blase in Treatment: 0 Height (in): Weight (lbs): Body Mass Index (BMI): Nutrition Risk Screening Items Score Screening NUTRITION RISK SCREEN: I have an illness or condition that made me change the kind and/or amount of food I eat 0 No I eat fewer than two meals per day 0 No I eat few fruits and vegetables, or milk products 0 No I have three or more drinks of beer, liquor or wine almost every day 0 No I have tooth or mouth problems that make it hard for me to eat 0 No I don't always have enough money to buy the food I need 0 No I eat alone most of the time 0 No I take three or more different prescribed or over-the-counter drugs a day 1 Yes Without wanting to, I have lost or gained 10 pounds in the last six months 0 No I am not always physically able to shop, cook and/or feed myself 0 No Nutrition Protocols Good Risk Protocol 0 No interventions needed Moderate Risk Protocol High Risk Proctocol Risk Level: Good Risk Score: 1 Electronic Signature(s) Signed: 06/06/2021 4:47:24 PM By: Rogers Blocker RN Entered By: Rogers Blocker on 06/06/2021 13:33:07

## 2021-06-06 NOTE — Progress Notes (Signed)
Justin [name], Justin [name] ([id number]) Visit Report for 06/06/2021 Allergy List Details Patient Name: Justin [name], Justin [name] Date of Service: 06/06/2021 12:45 PM Medical Record Number: [id number] Patient Account Number: [id number] Date of Birth/Sex: 1981-08-07 (40 y.o. M) Treating RN: Justin [name] Primary Care Justin [name]: Justin [name] Other Clinician: Referring Justin [name]: Justin [name] Treating Justin [name]/Extender: Justin [name] in Treatment: 0 Allergies Active Allergies azithromycin morphine Ativan Demerol Allergy Notes Electronic Signature(s) Signed: 06/06/2021 4:47:24 PM By: Justin Blocker RN Entered By: Justin [name] on 06/06/2021 13:30:37 Justin [name] ([id number]) -------------------------------------------------------------------------------- Arrival Information Details Patient Name: Justin [name] Date of Service: 06/06/2021 12:45 PM Medical Record Number: [id number] Patient Account Number: [id number] Date of Birth/Sex: 06-Oct-1980 (40 y.o. M) Treating RN: Justin [name] Primary Care Justin [name]: Justin [name] Other Clinician: Referring Innocence Schlotzhauer: Justin [name] Treating Justin [name]/Extender: Justin [name] in Treatment: 0 Visit Information Patient Arrived: Wheel Chair Arrival Time: 13:17 Accompanied By: significant other Transfer Assistance: None Patient Identification Verified: Yes Secondary Verification Process Completed: Yes History Since Last Visit Electronic Signature(s) Signed: 06/06/2021 4:47:24 PM By: Justin Blocker RN Entered By: Justin [name] on 06/06/2021 13:24:54 Justin [name] ([id number]) -------------------------------------------------------------------------------- Clinic Level of Care Assessment Details Patient Name: Justin [name] Date of Service: 06/06/2021 12:45 PM Medical Record Number: [id number] Patient Account Number: [id number] Date of Birth/Sex: [birth year] (40 y.o. M) Treating RN: Justin [name] Primary Care Justin [name]: Justin [name] Other Clinician: Referring Justin [name]: Justin [name] Treating Justin [name]/Extender: Justin [name] in Treatment: 0 Clinic Level of Care Assessment Items TOOL 2 Quantity Score X - Use when only an EandM is performed on the INITIAL visit 1 0 ASSESSMENTS - Nursing Assessment / Reassessment X - General Physical Exam (combine w/ comprehensive assessment (listed just below) when performed on new 1 20 pt. evals) X- 1 25 Comprehensive Assessment (HX, ROS, Risk Assessments, Wounds Hx, etc.) ASSESSMENTS - Wound and Skin Assessment / Reassessment X - Simple Wound Assessment / Reassessment - one wound 1 5 []  - 0 Complex Wound Assessment / Reassessment - multiple wounds []  - 0 Dermatologic / Skin Assessment (not related to wound area) ASSESSMENTS - Ostomy and/or Continence Assessment and Care []  - Incontinence Assessment and Management 0 []  - 0 Ostomy Care Assessment and Management (repouching, etc.) PROCESS - Coordination of Care X - Simple Patient / Family Education for ongoing care 1 15 []  - 0 Complex (extensive) Patient / Family Education for ongoing care X- 1 10 Staff obtains , Records, Test Results / Process Orders []  - 0 Staff telephones HHA, Nursing Homes / Clarify orders / etc []  - 0 Routine Transfer to another Facility (non-emergent condition) []  - 0 Routine Hospital Admission (non-emergent condition) X- 1 15 New Admissions / / Ordering NPWT, Apligraf, etc. []  - 0 Emergency Hospital Admission (emergent condition) X- 1 10 Simple Discharge Coordination []  - 0 Complex (extensive) Discharge Coordination PROCESS - Special Needs []  - Pediatric / Minor Patient Management 0 []  - 0 Isolation Patient Management []  - 0 Hearing / Language / Visual special needs []  - 0 Assessment of Community assistance (transportation, D/C planning, etc.) []  - 0 Additional assistance / Altered mentation []  - 0 Support Surface(s) Assessment (bed, cushion,  seat, etc.) INTERVENTIONS - Wound Cleansing / Measurement X - Wound Imaging (photographs - any number of wounds) 1 5 []  - 0 Wound Tracing (instead of photographs) X- 1 5 Simple Wound Measurement - one wound []  - 0 Complex Wound Measurement - multiple wounds Justin [name], Justin N. ( ) X- 1 5  Simple Wound Cleansing - one wound []  - 0 Complex Wound Cleansing - multiple wounds INTERVENTIONS - Wound Dressings []  - Small Wound Dressing one or multiple wounds 0 X- 1 15 Medium Wound Dressing one or multiple wounds []  - 0 Large Wound Dressing one or multiple wounds []  - 0 Application of Medications - injection INTERVENTIONS - Miscellaneous []  - External ear exam 0 []  - 0 Specimen Collection (cultures, biopsies, blood, body fluids, etc.) []  - 0 Specimen(s) / Culture(s) sent or taken to Lab for analysis []  - 0 Patient Transfer (multiple staff / / Similar devices) []  - 0 Simple Staple / Suture removal (25 or less) []  - 0 Complex Staple / Suture removal (26 or more) []  - 0 Hypo / Hyperglycemic Management (close monitor of Blood Glucose) []  - 0 Ankle / Brachial Index (ABI) - do not check if billed separately Has the patient been seen at the hospital within the last three years: Yes Total Score: 130 Level Of Care: New/Established - Level 4 Electronic Signature(s) Signed: 06/06/2021 4:47:24 PM By: RN Entered By: on 06/06/2021 14:03:27 Justin [name], Justin [name] ( ) -------------------------------------------------------------------------------- Encounter Discharge Information Details Patient Name: Date of Service: 06/06/2021 12:45 PM Medical Record Number: Patient Account Number: Date of Birth/Sex: 11-19-1980 (40 y.o. M) Treating RN: 06/08/2021 Primary Care Britini Garcilazo: Justin [name] Other Clinician: Referring Stockton Nunley: Justin [name] Treating Crystal Ellwood/Extender: 06/08/2021 in  Treatment: 0 Encounter Discharge Information Items Post Procedure Vitals Discharge Condition: Stable Temperature (F): 98.6 Ambulatory Status: Wheelchair Pulse (bpm): 116 Discharge Destination: Home Respiratory Rate (breaths/min): 18 Transportation: Private Auto Blood Pressure (mmHg): 125/82 Accompanied By: spouse Schedule Follow-up Appointment: Yes Clinical Summary of Care: Electronic Signature(s) Signed: 06/06/2021 4:43:42 PM By: [id number] RN Entered By: Justin [name] on 06/06/2021 16:43:42 HANZ, WINTERHALTER ([id number]) -------------------------------------------------------------------------------- Lower Extremity Assessment Details Patient Name: 12/05/1980 Date of Service: 06/06/2021 12:45 PM Medical Record Number: Justin [name] Patient Account Number: Justin [name] Date of Birth/Sex: 1981/04/19 (40 y.o. M) Treating RN: Justin [name] Primary Care Quincey Quesinberry: 06/08/2021 Other Clinician: Referring Jaryn Hocutt: Justin [name] Treating Chelsea Nusz/Extender: Justin [name] in Treatment: 0 Electronic Signature(s) Signed: 06/06/2021 4:47:24 PM By: Justin Rodney RN Entered By: [id number] on 06/06/2021 13:42:02 Justin [name], Justin [name] ([id number]) -------------------------------------------------------------------------------- Multi Wound Chart Details Patient Name: [id number] Date of Service: 06/06/2021 12:45 PM Medical Record Number: Justin [name] Patient Account Number: Justin [name] Date of Birth/Sex: [birth year] (40 y.o. M) Treating RN: Justin [name] Primary Care Loriann Bosserman: 06/08/2021 Other Clinician: Referring Marteze Vecchio: Justin [name] Treating Dominico Rod/Extender: Justin [name] in Treatment: 0 Vital Signs Height(in): Pulse(bpm): 116 Weight(lbs): Blood Pressure(mmHg): 125/82 Body Mass Index(BMI): Temperature(F): 98.6 Respiratory Rate(breaths/min): 18 Photos: [N/A:N/A] Wound Location: Right Gluteus N/A N/A Wounding Event: Pressure Injury N/A  N/A Primary Etiology: Pressure Ulcer N/A N/A Comorbid History: Pneumothorax, History of pressure N/A N/A wounds, Osteoarthritis, Paraplegia Date Acquired: 03/21/2021 N/A N/A [name] of Treatment: 0 N/A N/A Wound Status: Open N/A N/A Measurements L x W x D (cm) 2x1x0.1 N/A N/A Area (cm) : 1.571 N/A N/A Volume (cm) : 0.157 N/A N/A Classification: Category/Stage II N/A N/A Exudate Amount: Medium N/A N/A Exudate Type: Serous N/A N/A Exudate Color: amber N/A N/A Granulation Amount: Large (67-100%) N/A N/A Granulation Quality: Red, Pink N/A N/A Necrotic Amount: None Present (0%) N/A N/A Exposed Structures: Fat Layer (Subcutaneous Tissue): N/A N/A Yes Fascia: No Tendon: No Muscle: No Joint: No Bone: No Epithelialization: Large (67-100%)  N/A N/A Treatment Notes Electronic Signature(s) Signed: 06/06/2021 4:47:24 PM By: Justin Blocker RN Entered By: Justin [name] on 06/06/2021 13:57:25 Justin [name], Justin [name] ([id number]) -------------------------------------------------------------------------------- Multi-Disciplinary Care Plan Details Patient Name: Justin [name] Date of Service: 06/06/2021 12:45 PM Medical Record Number: [id number] Patient Account Number: [id number] Date of Birth/Sex: 12/08/1980 (40 y.o. M) Treating RN: Justin [name] Primary Care Keven Osborn: Justin [name] Other Clinician: Referring Cordie Buening: Justin [name] Treating Vieno Tarrant/Extender: Justin [name] in Treatment: 0 Active Inactive Orientation to the Wound Care Program Nursing Diagnoses: Knowledge deficit related to the wound healing center program Goals: Patient/caregiver will verbalize understanding of the Wound Healing Center Program Date Initiated: 06/06/2021 Target Resolution Date: 06/06/2021 Goal Status: Active Interventions: Provide education on orientation to the wound center Notes: Wound/Skin Impairment Nursing Diagnoses: Impaired tissue integrity Goals: Patient/caregiver will verbalize  understanding of skin care regimen Date Initiated: 06/06/2021 Target Resolution Date: 06/06/2021 Goal Status: Active Ulcer/skin breakdown will have a volume reduction of 30% by week 4 Date Initiated: 06/06/2021 Target Resolution Date: 07/06/2021 Goal Status: Active Ulcer/skin breakdown will have a volume reduction of 50% by week 8 Date Initiated: 06/06/2021 Target Resolution Date: 08/06/2021 Goal Status: Active Ulcer/skin breakdown will have a volume reduction of 80% by week 12 Date Initiated: 06/06/2021 Target Resolution Date: 09/05/2021 Goal Status: Active Ulcer/skin breakdown will heal within 14 [name] Date Initiated: 06/06/2021 Target Resolution Date: 10/06/2021 Goal Status: Active Interventions: Assess patient/caregiver ability to obtain necessary supplies Assess patient/caregiver ability to perform ulcer/skin care regimen upon admission and as needed Assess ulceration(s) every visit Provide education on ulcer and skin care Treatment Activities: Referred to DME Envy Meno for dressing supplies : 06/06/2021 Skin care regimen initiated : 06/06/2021 Notes: Electronic Signature(s) Signed: 06/06/2021 4:47:24 PM By: Justin Blocker RN Entered By: Justin [name] on 06/06/2021 13:57:14 Justin [name], Justin [name] ([id number]) Justin [name], Justin [name] ([id number]) -------------------------------------------------------------------------------- Pain Assessment Details Patient Name: Justin [name] Date of Service: 06/06/2021 12:45 PM Medical Record Number: [id number] Patient Account Number: [id number] Date of Birth/Sex: Nov 27, 1980 (40 y.o. M) Treating RN: Justin [name] Primary Care Dairon Procter: Justin [name] Other Clinician: Referring Aubrianne Molyneux: Justin [name] Treating Dainelle Hun/Extender: Justin [name] in Treatment: 0 Active Problems Location of Pain Severity and Description of Pain Patient Has Paino No Site Locations Rate the pain. Current Pain Level: 0 Pain Management and Medication Current  Pain Management: Electronic Signature(s) Signed: 06/06/2021 4:47:24 PM By: Justin Blocker RN Entered By: Justin [name] on 06/06/2021 13:25:00 Justin [name], Justin [name] ([id number]) -------------------------------------------------------------------------------- Patient/Caregiver Education Details Patient Name: Justin [name] Date of Service: 06/06/2021 12:45 PM Medical Record Number: [id number] Patient Account Number: [id number] Date of Birth/Gender: [birth year] (40 y.o. M) Treating RN: Justin [name] Primary Care Physician: Justin [name] Other Clinician: Referring Physician: Mila [name] Treating Physician/Extender: Justin [name] in Treatment: 0 Education Assessment Education Provided To: Patient Education Topics Provided Welcome To The Wound Care Center: Methods: Explain/Verbal Responses: State content correctly Wound/Skin Impairment: Methods: Explain/Verbal Responses: State content correctly Electronic Signature(s) Signed: 06/06/2021 4:47:24 PM By: Justin Blocker RN Entered By: Justin [name] on 06/06/2021 14:03:46 Justin [name], Justin [name] ([id number]) -------------------------------------------------------------------------------- Wound Assessment Details Patient Name: Justin [name] Date of Service: 06/06/2021 12:45 PM Medical Record Number: [id number] Patient Account Number: [id number] Date of Birth/Sex: [birth year] (40 y.o. M) Treating RN: Justin [name] Primary Care Seleste Tallman: Justin [name] Other Clinician: Referring Kenasia Scheller: Justin [name] Treating Lorah Kalina/Extender: Justin [name] in Treatment: 0 Wound Status Wound Number: 2 Primary Pressure Ulcer Etiology: Wound Location: Right Gluteus Wound Status: Open Wounding  Event: Pressure Injury Comorbid Pneumothorax, History of pressure wounds, Date Acquired: 03/21/2021 History: Osteoarthritis, Paraplegia [name] Of Treatment: 0 Clustered Wound: No Photos Wound Measurements Length: (cm) 2 Width: (cm) 1 Depth:  (cm) 0.1 Area: (cm) 1.571 Volume: (cm) 0.157 % Reduction in Area: % Reduction in Volume: Epithelialization: Large (67-100%) Tunneling: No Undermining: No Wound Description Classification: Category/Stage II Exudate Amount: Medium Exudate Type: Serous Exudate Color: amber Foul Odor After Cleansing: No Slough/Fibrino No Wound Bed Granulation Amount: Large (67-100%) Exposed Structure Granulation Quality: Red, Pink Fascia Exposed: No Necrotic Amount: None Present (0%) Fat Layer (Subcutaneous Tissue) Exposed: Yes Tendon Exposed: No Muscle Exposed: No Joint Exposed: No Bone Exposed: No Treatment Notes Wound #2 (Gluteus) Wound Laterality: Right Cleanser Soap and Water Discharge Instruction: Gently cleanse wound with antibacterial soap, rinse and pat dry prior to dressing wounds Wound Cleanser Discharge Instruction: Wash your hands with soap and water. Remove old dressing, discard into plastic bag and place into trash. Cleanse the wound with Wound Cleanser prior to applying a clean dressing using gauze sponges, not tissues or cotton balls. Do not Justin [name], Justin N. ([id number]) scrub or use excessive force. Pat dry using gauze sponges, not tissue or cotton balls. Peri-Wound Care Topical Primary Dressing Prisma 4.34 (in) Discharge Instruction: Moisten w/normal saline or sterile water; Cover wound as directed. Do not remove from wound bed. Secondary Dressing Nexcare Bandages Discharge Instruction: Patient to purchase Secured With Compression Wrap Compression Stockings Add-Ons Electronic Signature(s) Signed: 06/06/2021 4:47:24 PM By: Justin Blocker RN Entered By: Justin [name] on 06/06/2021 13:41:41 Justin [name], EISENHOUR ([id number]) -------------------------------------------------------------------------------- Vitals Details Patient Name: Justin [name] Date of Service: 06/06/2021 12:45 PM Medical Record Number: [id number] Patient Account Number: [id number] Date of  Birth/Sex: [birth year] (40 y.o. M) Treating RN: Justin [name] Primary Care Narada Uzzle: Justin [name] Other Clinician: Referring Aman Bonet: Justin [name] Treating Jake Fuhrmann/Extender: Justin [name] in Treatment: 0 Vital Signs Time Taken: 13:25 Temperature (F): 98.6 Pulse (bpm): 116 Respiratory Rate (breaths/min): 18 Blood Pressure (mmHg): 125/82 Reference Range: 80 - 120 mg / dl Electronic Signature(s) Signed: 06/06/2021 4:47:24 PM By: Justin Blocker RN Entered By: Justin [name] on 06/06/2021 13:29:48

## 2021-06-07 ENCOUNTER — Encounter: Payer: Self-pay | Admitting: Family Medicine

## 2021-06-07 NOTE — Progress Notes (Signed)
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 Medical History Notes: Depression Oncologic Medical History: Negative for: Received Chemotherapy; Received Radiation Immunizations Pneumococcal Vaccine: Justin [name], Justin [name] ([id number]) Received Pneumococcal Vaccination: No Implantable Devices None Family and Social History Cancer: Yes; Diabetes: No; Heart Disease: Yes - Father,Siblings; Hereditary Spherocytosis: No; Hypertension: Yes - Siblings; Kidney Disease: No; Lung Disease: No; Seizures: No; Stroke: Yes - Paternal Grandparents; Thyroid Problems: No; Tuberculosis: No; Never smoker; Marital Status - Married; Alcohol Use: Never; Drug Use: No History; Caffeine Use: Rarely - coffee; Financial Concerns: No; Food, Clothing or Shelter Needs: No; Support System  Lacking: No; Transportation Concerns: No Electronic Signature(s) Signed: 06/06/2021 4:47:24 PM By: Dolan Amen RN Signed: 06/06/2021 6:22:10 PM By: Worthy Keeler PA-C Entered By: Dolan Amen on 06/06/2021 13:31:50 Justin [name], Justin [name] ([id number]) --------------------------------------------------------------------------------  SuperBill Details Patient Name: Justin [name], [place] Date of Service: 06/06/2021 Medical Record Number: [id number] Patient Account Number: [id number] Date of Birth/Sex: 01-Jun-1981 (40 y.o. M) Treating RN: Dolan Amen Primary Care Provider: Lelon Huh Other Clinician: Referring Provider: Lelon Huh Treating Provider/Extender: Skipper Cliche in Treatment: 0 Diagnosis Coding ICD-10 Codes Code Description [phone] Pressure ulcer of right buttock, stage 2 G82.22 Paraplegia, incomplete Facility Procedures CPT4 Code: 12162446 Description: 99214 - WOUND CARE VISIT-LEV 4 EST PT Modifier: Quantity: 1 Physician Procedures CPT4 Code: 9507225 Description: WC PHYS LEVEL 3 o NEW PT Modifier: Quantity: 1 CPT4 Code: Description: ICD-10 Diagnosis Description J50.518 Pressure ulcer of right buttock, stage 2 G82.22 Paraplegia, incomplete Modifier: Quantity: Electronic Signature(s) Signed: 06/06/2021 5:53:48 PM By: Worthy Keeler PA-C Previous Signature: 06/06/2021 4:47:24 PM Version By: Dolan Amen RN Entered By: Worthy Keeler on 06/06/2021 17:53:47

## 2021-06-12 ENCOUNTER — Ambulatory Visit: Payer: Medicare HMO | Admitting: Family Medicine

## 2021-06-19 ENCOUNTER — Encounter: Payer: Medicare HMO | Attending: Physician Assistant | Admitting: Physician Assistant

## 2021-06-19 ENCOUNTER — Other Ambulatory Visit: Payer: Self-pay

## 2021-06-19 DIAGNOSIS — G8222 Paraplegia, incomplete: Secondary | ICD-10-CM | POA: Insufficient documentation

## 2021-06-19 DIAGNOSIS — L89312 Pressure ulcer of right buttock, stage 2: Secondary | ICD-10-CM | POA: Insufficient documentation

## 2021-06-19 NOTE — Progress Notes (Addendum)
NAMARI, BRETON ([id number]) Visit Report for 06/19/2021 Arrival Information Details Patient Name: Justin [name], Justin [name] Date of Service: 06/19/2021 3:15 PM Medical Record Number: [id number] Patient Account Number: [id number] Date of Birth/Sex: 1980-09-22 (40 y.o. M) Treating RN: Hansel Feinstein Primary Care Gedalia Mcmillon: Mila Merry Other Clinician: Referring Latiffany Harwick: Mila Merry Treating Tziporah Knoke/Extender: Rowan Blase in Treatment: 1 Visit Information History Since Last Visit Added or deleted any medications: No Patient Arrived: Wheel Chair Had a fall or experienced change in No Arrival Time: 15:20 activities of daily living that may affect Accompanied By: wife risk of falls: Transfer Assistance: Manual Hospitalized since last visit: No Patient Identification Verified: Yes Has Dressing in Place as Prescribed: Yes Secondary Verification Process Completed: Yes Pain Present Now: Yes Electronic Signature(s) Signed: 06/19/2021 4:16:00 PM By: Hansel Feinstein Entered By: Hansel Feinstein on 06/19/2021 15:24:19 Justin [name] ([id number]) -------------------------------------------------------------------------------- Clinic Level of Care Assessment Details Patient Name: Justin [name] Date of Service: 06/19/2021 3:15 PM Medical Record Number: [id number] Patient Account Number: [id number] Date of Birth/Sex: 1981/03/30 (40 y.o. M) Treating RN: Hansel Feinstein Primary Care Finneas Mathe: Mila Merry Other Clinician: Referring Lynnetta Tom: Mila Merry Treating Lesha Jager/Extender: Rowan Blase in Treatment: 1 Clinic Level of Care Assessment Items TOOL 4 Quantity Score []  - Use when only an EandM is performed on FOLLOW-UP visit 0 ASSESSMENTS - Nursing Assessment / Reassessment []  - Reassessment of Co-morbidities (includes updates in patient status) 0 X- 1 5 Reassessment of Adherence to Treatment Plan ASSESSMENTS - Wound and Skin Assessment / Reassessment X - Simple Wound Assessment  / Reassessment - one wound 1 5 []  - 0 Complex Wound Assessment / Reassessment - multiple wounds []  - 0 Dermatologic / Skin Assessment (not related to wound area) ASSESSMENTS - Focused Assessment []  - Circumferential Edema Measurements - multi extremities 0 []  - 0 Nutritional Assessment / Counseling / Intervention []  - 0 Lower Extremity Assessment (monofilament, tuning fork, pulses) []  - 0 Peripheral Arterial Disease Assessment (using hand held doppler) ASSESSMENTS - Ostomy and/or Continence Assessment and Care []  - Incontinence Assessment and Management 0 []  - 0 Ostomy Care Assessment and Management (repouching, etc.) PROCESS - Coordination of Care X - Simple Patient / Family Education for ongoing care 1 15 []  - 0 Complex (extensive) Patient / Family Education for ongoing care []  - 0 Staff obtains , Records, Test Results / Process Orders []  - 0 Staff telephones HHA, Nursing Homes / Clarify orders / etc []  - 0 Routine Transfer to another Facility (non-emergent condition) []  - 0 Routine Hospital Admission (non-emergent condition) []  - 0 New Admissions / / Ordering NPWT, Apligraf, etc. []  - 0 Emergency Hospital Admission (emergent condition) X- 1 10 Simple Discharge Coordination []  - 0 Complex (extensive) Discharge Coordination PROCESS - Special Needs []  - Pediatric / Minor Patient Management 0 []  - 0 Isolation Patient Management []  - 0 Hearing / Language / Visual special needs []  - 0 Assessment of Community assistance (transportation, D/C planning, etc.) []  - 0 Additional assistance / Altered mentation []  - 0 Support Surface(s) Assessment (bed, cushion, seat, etc.) INTERVENTIONS - Wound Cleansing / Measurement Justin [name], LABRADOR. ( ) X- 1 5 Simple Wound Cleansing - one wound []  - 0 Complex Wound Cleansing - multiple wounds X- 1 5 Wound Imaging (photographs - any number of wounds) []  - 0 Wound Tracing (instead of  photographs) X- 1 5 Simple Wound Measurement - one wound []  - 0 Complex Wound Measurement - multiple wounds INTERVENTIONS - Wound Dressings X -  Small Wound Dressing one or multiple wounds 1 10 []  - 0 Medium Wound Dressing one or multiple wounds []  - 0 Large Wound Dressing one or multiple wounds X- 1 5 Application of Medications - topical []  - 0 Application of Medications - injection INTERVENTIONS - Miscellaneous []  - External ear exam 0 []  - 0 Specimen Collection (cultures, biopsies, blood, body fluids, etc.) []  - 0 Specimen(s) / Culture(s) sent or taken to Lab for analysis []  - 0 Patient Transfer (multiple staff / / Similar devices) []  - 0 Simple Staple / Suture removal (25 or less) []  - 0 Complex Staple / Suture removal (26 or more) []  - 0 Hypo / Hyperglycemic Management (close monitor of Blood Glucose) []  - 0 Ankle / Brachial Index (ABI) - do not check if billed separately X- 1 5 Vital Signs Has the patient been seen at the hospital within the last three years: Yes Total Score: 70 Level Of Care: New/Established - Level 2 Electronic Signature(s) Signed: 06/19/2021 4:16:00 PM By: Entered By: on 06/19/2021 16:08:54 ( ) -------------------------------------------------------------------------------- Encounter Discharge Information Details Patient Name: Nurse, adult Date of Service: 06/19/2021 3:15 PM Medical Record Number: Patient Account Number: Date of Birth/Sex: Oct 12, 1980 (40 y.o. M) Treating RN: Hansel Feinstein Primary Care Drayce Tawil: Hansel Feinstein Other Clinician: Referring Darlin Stenseth: 08/19/2021 Treating Lorijean Husser/Extender: Justin [name] in Treatment: 1 Encounter Discharge Information Items Discharge Condition: Stable Ambulatory Status: Wheelchair Discharge Destination: Home Transportation: Private Auto Accompanied By: wife Schedule Follow-up Appointment:  Yes Clinical Summary of Care: Electronic Signature(s) Signed: 06/19/2021 4:16:00 PM By: Justin [name] Entered By: 08/19/2021 on 06/19/2021 16:15:41 [id number] (12/05/1980) -------------------------------------------------------------------------------- Lower Extremity Assessment Details Patient Name: 41 Date of Service: 06/19/2021 3:15 PM Medical Record Number: Mila Merry Patient Account Number: Mila Merry Date of Birth/Sex: 03-19-1981 (40 y.o. M) Treating RN: 08/19/2021 Primary Care Michaelanthony Kempton: Hansel Feinstein Other Clinician: Referring Zeplin Aleshire: Hansel Feinstein Treating Bernadette Armijo/Extender: 08/19/2021 in Treatment: 1 Electronic Signature(s) Signed: 06/19/2021 4:16:00 PM By: [id number] Entered By: Justin [name] on 06/19/2021 15:31:02 Justin [name], Justin [name] ([id number]) -------------------------------------------------------------------------------- Multi Wound Chart Details Patient Name: 12/05/1980 Date of Service: 06/19/2021 3:15 PM Medical Record Number: Mila Merry Patient Account Number: Mila Merry Date of Birth/Sex: 01/18/1981 (40 y.o. M) Treating RN: 08/19/2021 Primary Care Chrystian Ressler: Hansel Feinstein Other Clinician: Referring Chalon Zobrist: Hansel Feinstein Treating Solomon Skowronek/Extender: 08/19/2021 in Treatment: 1 Vital Signs Height(in): Pulse(bpm): 109 Weight(lbs): Blood Pressure(mmHg): 134/88 Body Mass Index(BMI): Temperature(F): 98.6 Respiratory Rate(breaths/min): 18 Photos: [N/A:N/A] Wound Location: Right Gluteus N/A N/A Wounding Event: Pressure Injury N/A N/A Primary Etiology: Pressure Ulcer N/A N/A Comorbid History: Pneumothorax, History of pressure N/A N/A wounds, Osteoarthritis, Paraplegia Date Acquired: 03/21/2021 N/A N/A Weeks of Treatment: 1 N/A N/A Wound Status: Open N/A N/A Measurements L x W x D (cm) 0.5x0.5x0.1 N/A N/A Area (cm) : 0.196 N/A N/A Volume (cm) : 0.02 N/A N/A % Reduction in Area: 87.50% N/A N/A % Reduction in  Volume: 87.30% N/A N/A Classification: Category/Stage II N/A N/A Exudate Amount: Medium N/A N/A Exudate Type: Serous N/A N/A Exudate Color: amber N/A N/A Granulation Amount: Large (67-100%) N/A N/A Granulation Quality: Red, Pink N/A N/A Necrotic Amount: Small (1-33%) N/A N/A Exposed Structures: Fat Layer (Subcutaneous Tissue): N/A N/A Yes Fascia: No Tendon: No Muscle: No Joint: No Bone: No Epithelialization: Large (67-100%) N/A N/A Treatment Notes Electronic Signature(s) Signed: 06/19/2021 4:16:00 PM By: Justin [name] Entered By12/11/2020 on 06/19/2021 15:33:09  Justin [name], Justin [name] ([id number]) -------------------------------------------------------------------------------- Multi-Disciplinary Care Plan Details Patient Name: Justin [name], Justin [name] Date of Service: 06/19/2021 3:15 PM Medical Record Number: [id number] Patient Account Number: [id number] Date of Birth/Sex: June 10, 1981 (40 y.o. M) Treating RN: Hansel Feinstein Primary Care Shaneen Reeser: Mila Merry Other Clinician: Referring Zale Marcotte: Mila Merry Treating Jaeleigh Monaco/Extender: Rowan Blase in Treatment: 1 Active Inactive Electronic Signature(s) Signed: 07/07/2021 5:06:41 PM By: Rogers Blocker RN Signed: 07/12/2021 4:22:28 PM By: Hansel Feinstein Previous Signature: 06/19/2021 4:16:00 PM Version By: Hansel Feinstein Entered By: Rogers Blocker on 07/05/2021 14:22:53 Justin [name], Justin [name] ([id number]) -------------------------------------------------------------------------------- Pain Assessment Details Patient Name: Justin [name] Date of Service: 06/19/2021 3:15 PM Medical Record Number: [id number] Patient Account Number: [id number] Date of Birth/Sex: Feb 04, 1981 (40 y.o. M) Treating RN: Hansel Feinstein Primary Care Doninique Lwin: Mila Merry Other Clinician: Referring Jahzier Villalon: Mila Merry Treating Eleana Tocco/Extender: Rowan Blase in Treatment: 1 Active Problems Location of Pain Severity and Description of Pain Patient Has  Paino Yes Site Locations Pain Location: Generalized Pain Rate the pain. Current Pain Level: 4 Pain Management and Medication Current Pain Management: Electronic Signature(s) Signed: 06/19/2021 4:16:00 PM By: Hansel Feinstein Entered By: Hansel Feinstein on 06/19/2021 15:27:53 Justin [name], Justin [name] ([id number]) -------------------------------------------------------------------------------- Patient/Caregiver Education Details Patient Name: Justin [name] Date of Service: 06/19/2021 3:15 PM Medical Record Number: [id number] Patient Account Number: [id number] Date of Birth/Gender: Jan 18, 1981 (40 y.o. M) Treating RN: Hansel Feinstein Primary Care Physician: Mila Merry Other Clinician: Referring Physician: Mila Merry Treating Physician/Extender: Rowan Blase in Treatment: 1 Education Assessment Education Provided To: Patient Education Topics Provided Basic Hygiene: Offloading: Wound/Skin Impairment: Electronic Signature(s) Signed: 06/19/2021 4:16:00 PM By: Hansel Feinstein Entered By: Hansel Feinstein on 06/19/2021 16:09:12 Justin [name] ([id number]) -------------------------------------------------------------------------------- Wound Assessment Details Patient Name: Justin [name] Date of Service: 06/19/2021 3:15 PM Medical Record Number: [id number] Patient Account Number: [id number] Date of Birth/Sex: [birth year] (40 y.o. M) Treating RN: Hansel Feinstein Primary Care Khushi Zupko: Mila Merry Other Clinician: Referring Caniyah Murley: Mila Merry Treating Clelia Trabucco/Extender: Rowan Blase in Treatment: 1 Wound Status Wound Number: 2 Primary Pressure Ulcer Etiology: Wound Location: Right Gluteus Wound Status: Open Wounding Event: Pressure Injury Comorbid Pneumothorax, History of pressure wounds, Date Acquired: 03/21/2021 History: Osteoarthritis, Paraplegia Weeks Of Treatment: 1 Clustered Wound: No Photos Wound Measurements Length: (cm) 0.5 Width: (cm) 0.5 Depth: (cm)  0.1 Area: (cm) 0.196 Volume: (cm) 0.02 % Reduction in Area: 87.5% % Reduction in Volume: 87.3% Epithelialization: Large (67-100%) Tunneling: No Undermining: No Wound Description Classification: Category/Stage II Exudate Amount: Medium Exudate Type: Serous Exudate Color: amber Foul Odor After Cleansing: No Slough/Fibrino No Wound Bed Granulation Amount: Large (67-100%) Exposed Structure Granulation Quality: Red, Pink Fascia Exposed: No Necrotic Amount: Small (1-33%) Fat Layer (Subcutaneous Tissue) Exposed: Yes Necrotic Quality: Adherent Slough Tendon Exposed: No Muscle Exposed: No Joint Exposed: No Bone Exposed: No Electronic Signature(s) Signed: 06/19/2021 4:16:00 PM By: Hansel Feinstein Entered By: Hansel Feinstein on 06/19/2021 15:30:07 Justin [name] ([id number]) -------------------------------------------------------------------------------- Vitals Details Patient Name: Justin [name] Date of Service: 06/19/2021 3:15 PM Medical Record Number: [id number] Patient Account Number: [id number] Date of Birth/Sex: 1981/01/20 (40 y.o. M) Treating RN: Hansel Feinstein Primary Care Kyvon Hu: Mila Merry Other Clinician: Referring Majid Mccravy: Mila Merry Treating Lucion Dilger/Extender: Rowan Blase in Treatment: 1 Vital Signs Time Taken: 15:24 Temperature (F): 98.6 Pulse (bpm): 109 Respiratory Rate (breaths/min): 18 Blood Pressure (mmHg): 134/88 Reference Range: 80 - 120 mg / dl Electronic Signature(s) Signed: 06/19/2021 4:16:00 PM By: Hansel Feinstein Entered ByHansel Feinstein on 06/19/2021 15:25:30
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
## 2021-06-21 ENCOUNTER — Telehealth: Payer: Medicare HMO | Admitting: Family Medicine

## 2021-06-24 ENCOUNTER — Encounter: Payer: Self-pay | Admitting: Family Medicine

## 2021-06-26 DIAGNOSIS — R339 Retention of urine, unspecified: Secondary | ICD-10-CM | POA: Diagnosis not present

## 2021-07-03 ENCOUNTER — Other Ambulatory Visit: Payer: Self-pay | Admitting: Family Medicine

## 2021-07-03 DIAGNOSIS — G894 Chronic pain syndrome: Secondary | ICD-10-CM

## 2021-07-03 DIAGNOSIS — G8220 Paraplegia, unspecified: Secondary | ICD-10-CM

## 2021-07-03 DIAGNOSIS — F988 Other specified behavioral and emotional disorders with onset usually occurring in childhood and adolescence: Secondary | ICD-10-CM

## 2021-07-04 ENCOUNTER — Ambulatory Visit: Payer: Medicare HMO | Admitting: Physician Assistant

## 2021-07-04 ENCOUNTER — Other Ambulatory Visit: Payer: Self-pay

## 2021-07-04 ENCOUNTER — Encounter: Payer: Self-pay | Admitting: Family Medicine

## 2021-07-04 ENCOUNTER — Other Ambulatory Visit: Payer: Self-pay | Admitting: Family Medicine

## 2021-07-04 ENCOUNTER — Telehealth: Payer: Medicare HMO | Admitting: Physician Assistant

## 2021-07-04 ENCOUNTER — Telehealth: Payer: Self-pay | Admitting: Family Medicine

## 2021-07-04 DIAGNOSIS — J069 Acute upper respiratory infection, unspecified: Secondary | ICD-10-CM

## 2021-07-04 DIAGNOSIS — F988 Other specified behavioral and emotional disorders with onset usually occurring in childhood and adolescence: Secondary | ICD-10-CM

## 2021-07-04 DIAGNOSIS — N390 Urinary tract infection, site not specified: Secondary | ICD-10-CM

## 2021-07-04 DIAGNOSIS — G894 Chronic pain syndrome: Secondary | ICD-10-CM

## 2021-07-04 MED ORDER — BENZONATATE 100 MG PO CAPS
100.0000 mg | ORAL_CAPSULE | Freq: Three times a day (TID) | ORAL | 0 refills | Status: DC | PRN
Start: 2021-07-04 — End: 2021-10-27

## 2021-07-04 MED ORDER — FLUTICASONE PROPIONATE 50 MCG/ACT NA SUSP: 2.0000 | Freq: Every day | NASAL | 0 refills | Status: DC

## 2021-07-04 NOTE — Patient Instructions (Signed)
1. Viral URI with cough  - test for COVID today  -continue Advil 4x per day  - rest and drink plenty of fluids  - tessalon for cough  - Flonase for nasal congestion.  - Follow up UC or ER if symptoms worsen or you develop difficulty breathing.  - repeat e-visit for Paxlovid or molnupiravir if your COVID test is positive

## 2021-07-04 NOTE — Progress Notes (Signed)
Justin [name], gorter are scheduled for a virtual visit with your provider today.    Just as we do with appointments in the office, we must obtain your consent to participate.  Your consent will be active for this visit and any virtual visit you may have with one of our providers in the next 365 days.    If you have a MyChart account, I can also send a copy of this consent to you electronically.  All virtual visits are billed to your insurance company just like a traditional visit in the office.  As this is a virtual visit, video technology does not allow for your provider to perform a traditional examination.  This may limit your provider's ability to fully assess your condition.  If your provider identifies any concerns that need to be evaluated in person or the need to arrange testing such as labs, EKG, etc, we will make arrangements to do so.    Although advances in technology are sophisticated, we cannot ensure that it will always work on either your end or our end.  If the connection with a video visit is poor, we may have to switch to a telephone visit.  With either a video or telephone visit, we are not always able to ensure that we have a secure connection.   I need to obtain your verbal consent now.   Are you willing to proceed with your visit today?   Justin [name] has provided verbal consent on 07/04/2021 for a virtual visit (video or telephone).   Justin Client Karthik Whittinghill, PA-C 07/04/2021  12:06 PM   Date:  07/04/2021   ID:  Justin [name], DOB 08/21/1981, MRN [id number]  Patient Location: Home Provider Location: Home Office   Participants: Patient and Provider for Visit and Wrap up  Method of visit: Video  Location of Patient: Home Location of Provider: Home Office Consent was obtain for visit over the video. Services rendered by provider: Visit was performed via video  A video enabled telemedicine application was used and I verified that I am speaking with the  correct person using two identifiers.  PCP:  Malva Limes, MD   Chief Complaint:  URI  History of Present Illness:    Justin [name] is a 40 y.o. male with history as stated below. Presents video telehealth for an acute care visit  Pt has a hx of spinal cord injury and subsequent paraplegia.  Onset of symptoms was 2 days ago and symptoms have been persistent and include: chest congestion, sneezing, cough, nasal congestion.  No fevers at home.  No nausea, vomiting or   Wife was sick with similar and did have a COVID exposure.   Modifying factors include: cold/flu medication with mild relief.   No other aggravating or relieving factors.  No other c/o.  The patient does have symptoms concerning for COVID-19 infection (fever, chills, cough, or new shortness of breath).  Patient has not been tested for COVID during this illness - result: n/a.   Not vaccinated for COVID.  Pt did have COVID approx 1 year ago.   Past Medical, Surgical, Social History, Allergies, and Medications have been Reviewed.  Patient Active Problem List   Diagnosis Date Noted   Infection due to urethral catheter (HCC) 04/22/2020   Spinal cord injury, thoracic region [facility]) 11/15/2017   Chronic, continuous use of opioids 04/27/2016   Depression 11/30/2015   Chronic hoarseness 10/26/2015   Hypothyroidism 10/26/2015   Elevated transaminase level 08/10/2015   Spasticity  07/30/2015   Paraplegia (HCC) 07/21/2015   Bladder neurogenesis 04/28/2015   Infection due to drug-resistant organism 03/22/2015   Chronic UTI 03/11/2015   Chronic pain associated with significant psychosocial dysfunction 02/18/2015   ADD (attention deficit disorder) 02/17/2015   Anxiety 02/17/2015   Chronic constipation 02/17/2015   Bed sore on buttock 02/17/2015   Abscess, dental 02/17/2015   Dermatitis, eczematoid 02/17/2015   Dizziness 02/17/2015   Esophageal reflux 02/17/2015   Hypesthesia 02/17/2015   Decubitus ulcer  02/17/2015   Spasm 02/17/2015   Concussion and swelling of spinal cord 02/17/2015   Body tinea 02/17/2015   GERD (gastroesophageal reflux disease) 02/17/2015    Social History   Tobacco Use   Smoking status: Former   Smokeless tobacco: Never   Tobacco comments:    Using Nicorette Gum  Substance Use Topics   Alcohol use: Yes    Alcohol/week: 0.0 standard drinks    Comment: occasionaly-rare     Current Outpatient Medications:    benzonatate (TESSALON PERLES) 100 MG capsule, Take 1 capsule (100 mg total) by mouth 3 (three) times daily as needed for cough (cough)., Disp: 20 capsule, Rfl: 0   fluticasone (FLONASE) 50 MCG/ACT nasal spray, Place 2 sprays into both nostrils daily., Disp: 9.9 g, Rfl: 0   albuterol (PROVENTIL HFA;VENTOLIN HFA) 108 (90 Base) MCG/ACT inhaler, Inhale 2 puffs into the lungs every 6 (six) hours as needed for wheezing or shortness of breath., Disp: 1 Inhaler, Rfl: 0   alprazolam (XANAX) 2 MG tablet, TAKE ONE TABLET EVERY SIX HOURS IF NEEDED, Disp: 120 tablet, Rfl: 5   amphetamine-dextroamphetamine (ADDERALL) 15 MG tablet, TAKE ONE TABLET TWICE DAILY, Disp: 60 tablet, Rfl: 0   azelastine (OPTIVAR) 0.05 % ophthalmic solution, Place 1 drop into both eyes 2 (two) times daily., Disp: 6 mL, Rfl: 4   baclofen (LIORESAL) 20 MG tablet, TAKE ONE TABLET FOUR TIMES DAILY, Disp: 120 each, Rfl: 12   cefdinir (OMNICEF) 300 MG capsule, TAKE 2 CAPSULES BY MOUTH DAILY FOR 7 DAYS, Disp: 14 capsule, Rfl: 0   clotrimazole (LOTRIMIN) 1 % cream, Apply 1 application topically 2 (two) times daily., Disp: 30 g, Rfl: 0   clotrimazole-betamethasone (LOTRISONE) cream, Apply 1 application topically 2 (two) times daily., Disp: 30 g, Rfl: 4   diphenhydrAMINE (BENADRYL) 25 MG tablet, Take 1 tablet by mouth as needed., Disp: , Rfl:    docusate sodium (COLACE) 250 MG capsule, Take 1 capsule (250 mg total) by mouth 4 (four) times daily as needed., Disp: 120 capsule, Rfl: 3   dronabinol (MARINOL) 10  MG capsule, Take 1 capsule (10 mg total) by mouth every 6 (six) hours as needed., Disp: 30 capsule, Rfl: 1   flavoxATE (URISPAS) 100 MG tablet, Take 1 tablet (100 mg total) by mouth 3 (three) times daily as needed for bladder spasms., Disp: 15 tablet, Rfl: 1   gabapentin (NEURONTIN) 800 MG tablet, Take 1 tablet (800 mg total) by mouth 4 (four) times daily., Disp: 120 tablet, Rfl: 5   levothyroxine (SYNTHROID) 88 MCG tablet, TAKE ONE TABLET BY MOUTH EVERY DAY, Disp: 30 tablet, Rfl: 12   lidocaine (LMX) 4 % cream, Apply 1 application topically 3 (three) times daily., Disp: 133 g, Rfl: 5   LINZESS 145 MCG CAPS capsule, TAKE 1 CAPSULE BY MOUTH DAILY BEFORE BREAKFAST., Disp: 90 capsule, Rfl: 1   methadone (DOLOPHINE) 10 MG tablet, TAKE TWO TABLETS BY MOUTH EVERY 6 HOURS, Disp: 240 tablet, Rfl: 0   Mineral Oil OIL, daily.,  Disp: , Rfl:    naproxen (NAPROSYN) 500 MG tablet, TAKE ONE TABLET TWICE DAILY, Disp: 60 tablet, Rfl: 5   omeprazole (PRILOSEC) 20 MG capsule, TAKE 1 CAPSULE BY MOUTH EVERY DAY, Disp: 30 capsule, Rfl: 3   oxybutynin (DITROPAN) 5 MG tablet, Take 5 mg by mouth., Disp: , Rfl:    oxyCODONE (ROXICODONE) 15 MG immediate release tablet, TAKE ONE TABLET BY MOUTH EVERY 6 HOURS AS NEEDED FOR PAIN, Disp: 120 tablet, Rfl: 0   PARoxetine (PAXIL) 30 MG tablet, TAKE 2 TABLETS BY MOUTH DAILY, Disp: 60 tablet, Rfl: 12   phenazopyridine (PYRIDIUM) 200 MG tablet, Take 1 tablet (200 mg total) by mouth 3 (three) times daily., Disp: 10 tablet, Rfl: 5   polyethylene glycol powder (QC NATURA-LAX) 17 GM/SCOOP powder, MIX 1 CAPFUL IN 8 0Z OF WATER OR JUICE AND DRINK ONCE A DAY, Disp: 510 g, Rfl: 3   promethazine (PHENERGAN) 25 MG suppository, Place 1 suppository (25 mg total) rectally every 6 (six) hours as needed for nausea or vomiting. (Patient not taking: Reported on 01/31/2021), Disp: 12 each, Rfl: 1   promethazine (PHENERGAN) 25 MG tablet, TAKE 1 TABLET BY MOUTH EVERY 4 HOURS AS NEEDED FOR NAUSEA/VOMITING,  Disp: 30 tablet, Rfl: 2   sildenafil (REVATIO) 20 MG tablet, Take 3 to 5 tablets two hours before intercouse on an empty stomach.  Do not take with nitrates., Disp: 50 tablet, Rfl: 3   silver sulfADIAZINE (SSD) 1 % cream, APPLY TO AFFECTED AREAS TOPICALLY TWICE DAILY, Disp: 400 g, Rfl: 2   tiZANidine (ZANAFLEX) 4 MG tablet, TAKE 2 TABLETS BY MOUTH 4 TIMES DAILY, Disp: 240 tablet, Rfl: 3   VESICARE 10 MG tablet, TAKE 1 TABLET BY MOUTH DAILY, Disp: 30 tablet, Rfl: 11  Current Facility-Administered Medications:    methylPREDNISolone acetate (DEPO-MEDROL) injection 40 mg, 40 mg, Intramuscular, Once, Fisher, Demetrios Isaacs, MD   Allergies  Allergen Reactions   Azithromycin     throat closes   Iodine    Lorazepam     extremely hostile   Meperidine    Morphine Sulfate     Psychotic symptoms     Review of Systems  Constitutional:  Negative for chills and fever.  HENT:  Positive for congestion. Negative for ear pain and sore throat.   Eyes:  Negative for blurred vision and double vision.  Respiratory:  Positive for cough. Negative for shortness of breath and wheezing.   Cardiovascular:  Negative for chest pain, palpitations and leg swelling.  Gastrointestinal:  Negative for abdominal pain, diarrhea, nausea and vomiting.  Genitourinary:  Negative for dysuria.  Musculoskeletal:  Negative for myalgias.  Skin:  Negative for rash.  Neurological:  Negative for loss of consciousness, weakness and headaches.  Psychiatric/Behavioral:  The patient is not nervous/anxious.   See HPI for history of present illness.  Physical Exam Constitutional:      General: He is not in acute distress.    Appearance: Normal appearance. He is well-developed. He is not ill-appearing.     Comments: Seated in wheelchair  HENT:     Head: Normocephalic and atraumatic.     Nose: Congestion present.  Eyes:     General: No scleral icterus.    Conjunctiva/sclera: Conjunctivae normal.  Pulmonary:     Effort: Pulmonary  effort is normal.     Comments: Congested cough Speaks in full sentences Musculoskeletal:        General: Normal range of motion.     Cervical back: Normal range  of motion.  Skin:    Coloration: Skin is not pale.  Neurological:     General: No focal deficit present.     Mental Status: He is alert.  Psychiatric:        Mood and Affect: Mood normal.              A&P  1. Viral URI with cough  - test for COVID today  -continue Advil 4x per day  - rest and drink plenty of fluids  - tessalon for cough  - Flonase for nasal congestion.  - Follow up UC or ER if symptoms worsen or you develop difficulty breathing.  - repeat e-visit for Paxlovid or molnupiravir if your COVID test is positive   Patient voiced understanding and agreement to plan.   Time:   Today, I have spent 15 minutes with the patient with telehealth technology discussing the above problems, reviewing the chart, previous notes, medications and orders.    Tests Ordered: No orders of the defined types were placed in this encounter.   Medication Changes: Meds ordered this encounter  Medications   benzonatate (TESSALON PERLES) 100 MG capsule    Sig: Take 1 capsule (100 mg total) by mouth 3 (three) times daily as needed for cough (cough).    Dispense:  20 capsule    Refill:  0   fluticasone (FLONASE) 50 MCG/ACT nasal spray    Sig: Place 2 sprays into both nostrils daily.    Dispense:  9.9 g    Refill:  0     Disposition:  Follow up UC or ER if symptoms worsen or you develop difficulty breathing.  Yetta Barre, PA-C  07/04/2021 12:20 PM

## 2021-07-04 NOTE — Telephone Encounter (Signed)
Na

## 2021-07-05 ENCOUNTER — Encounter: Payer: Self-pay | Admitting: Family Medicine

## 2021-07-05 ENCOUNTER — Ambulatory Visit: Payer: Self-pay

## 2021-07-05 NOTE — Telephone Encounter (Signed)
Patient called back asking for temporary supply until refill comes in of pain medications

## 2021-07-05 NOTE — Telephone Encounter (Signed)
Requested medication (s) are due for refill today: signed today  Requested medication (s) are on the active medication list: yes  Last refill:  07/05/21  Future visit scheduled: na  Notes to clinic:  unable to refuse request due to narcotics. Pharmacy reports medications ready for pickup. Called patient to inform medccation ready for pick up .      Requested Prescriptions  Pending Prescriptions Disp Refills   oxyCODONE (ROXICODONE) 15 MG immediate release tablet 120 tablet 0    Sig: Take 1 tablet (15 mg total) by mouth every 6 (six) hours as needed. for pain     Not Delegated - Analgesics:  Opioid Agonists Failed - 07/05/2021  3:37 PM      Failed - This refill cannot be delegated      Failed - Urine Drug Screen completed in last 360 days      Passed - Valid encounter within last 6 months    Recent Outpatient Visits           5 months ago Cellulitis, unspecified cellulitis site   [facility] Malva Limes, MD   9 months ago Edema, unspecified type   [facility] Malva Limes, MD   11 months ago Dysuria   [facility] Chrismon, Jodell Cipro, PA-C   1 year ago Pressure injury of skin of buttock, unspecified injury stage, unspecified laterality   [facility] [place], Adriana M, PA-C   1 year ago Urinary tract infection associated with catheterization of urinary tract, unspecified indwelling urinary catheter type, subsequent encounter   [facility] Chrismon, Jodell Cipro, PA-C       Future Appointments             In 3 months Fisher, Demetrios Isaacs, MD [facility]             methadone (DOLOPHINE) 10 MG tablet 240 tablet 0     Not Delegated - Analgesics:  Opioid Agonists Failed - 07/05/2021  3:37 PM      Failed - This refill cannot be delegated      Failed - Urine Drug Screen completed in last 360 days      Passed - Valid encounter within last 6 months    Recent Outpatient Visits            5 months ago Cellulitis, unspecified cellulitis site   [facility] Malva Limes, MD   9 months ago Edema, unspecified type   [facility] Malva Limes, MD   11 months ago Dysuria   [facility] Chrismon, Jodell Cipro, PA-C   1 year ago Pressure injury of skin of buttock, unspecified injury stage, unspecified laterality   [facility] [place], [place], PA-C   1 year ago Urinary tract infection associated with catheterization of urinary tract, unspecified indwelling urinary catheter type, subsequent encounter   [facility] Chrismon, Jodell Cipro, PA-C       Future Appointments             In 3 months Fisher, Demetrios Isaacs, MD [facility]             cefdinir (OMNICEF) 300 MG capsule 14 capsule 0     Off-Protocol Failed - 07/05/2021  3:37 PM      Failed - Medication not assigned to a protocol, review manually.      Passed - Valid encounter within last 12 months  Recent Outpatient Visits           5 months ago Cellulitis, unspecified cellulitis site   [facility] Malva Limes, MD   9 months ago Edema, unspecified type   [facility] Malva Limes, MD   11 months ago Dysuria   [facility] Chrismon, Jodell Cipro, PA-C   1 year ago Pressure injury of skin of buttock, unspecified injury stage, unspecified laterality   [facility] [place], Adriana M, PA-C   1 year ago Urinary tract infection associated with catheterization of urinary tract, unspecified indwelling urinary catheter type, subsequent encounter   [facility] Chrismon, Jodell Cipro, PA-C       Future Appointments             In 3 months Fisher, Demetrios Isaacs, MD [facility]             amphetamine-dextroamphetamine (ADDERALL) 15 MG tablet 60 tablet 0    Sig: TAKE ONE TABLET TWICE DAILY     Not Delegated - Psychiatry:   Stimulants/ADHD Failed - 07/05/2021  3:37 PM      Failed - This refill cannot be delegated      Failed - Urine Drug Screen completed in last 360 days      Failed - Valid encounter within last 3 months    Recent Outpatient Visits           5 months ago Cellulitis, unspecified cellulitis site   [facility] Malva Limes, MD   9 months ago Edema, unspecified type   [facility] Malva Limes, MD   11 months ago Dysuria   [facility] Chrismon, Jodell Cipro, PA-C   1 year ago Pressure injury of skin of buttock, unspecified injury stage, unspecified laterality   [facility] [place], [employer] [place], PA-C   1 year ago Urinary tract infection associated with catheterization of urinary tract, unspecified indwelling urinary catheter type, subsequent encounter   [facility] Chrismon, Jodell Cipro, PA-C       Future Appointments             In 3 months Fisher, Demetrios Isaacs, MD [facility]

## 2021-07-05 NOTE — Telephone Encounter (Signed)
Pt called in stating he does self caths and when he was inserting the catheter, he pushed the syringe in at the wrong time and caused a urethral tear. He had blood in his urine at first but irrigated his bladder like they showed him the last time he went to the hospital for this same problem. After the irrigation, the bleeding stopped but he noticed his scrotum was swollen and had a nodule appear at the urethral opening. Pt stated he had some old Keflex at the house from an old infection and he took one of those this morning at [facility] after the injury and now the symptoms have gotten better but he wanted to get some recommendations on whether he needed to be seen or not. Advised pt that he would need to be seen for evaluation and treatment. Attempted to schedule appt but no availability. Spoke with [place], [facility] who stated she didn't have any availability in the office until next week. Advised pt of update and that he would need to be seen at [facility]. Pt stated he would go to UC this afternoon after his wife got home d/t him being partially paralyzed and unable to drive himself. Care advice given and pt verbalized understanding.   Reason for Disposition  Entire penis is swollen (i.e., edema)  Answer Assessment - Initial Assessment Questions 1. SYMPTOM: "What's the main symptom you're concerned about?" (e.g., discharge from penis, rash, pain, itching, swelling)     Stretched urethal opening  2. LOCATION: "Where is the tear located?"     [place] 3. ONSET: "When did injury start?"     3am  4. PAIN: "Is there any pain?" If Yes, ask: "How bad is it?"  (Scale 1-10; or mild, moderate, severe)     No 5. URINE: "Any difficulty passing urine?" If Yes, ask: "When was the last time?"     no 6. CAUSE: "What do you think is causing the symptoms?"     Cathing self  7. OTHER SYMPTOMS: "Do you have any other symptoms?" (e.g., fever, abdominal pain, blood in urine)     Scrotal swelling  Protocols used: Penis and Scrotum  Symptoms-A-AH

## 2021-07-07 ENCOUNTER — Telehealth: Payer: Self-pay

## 2021-07-07 NOTE — Telephone Encounter (Signed)
Copied from CRM [id number]. Topic: General - Other >> Jul 07, 2021  9:32 AM Gwenlyn Fudge wrote: Reason for CRM: Pt called stating that he is feeling better and is wanting nurse to know. Please advise.

## 2021-07-07 NOTE — Telephone Encounter (Signed)
FYI

## 2021-07-24 DIAGNOSIS — R339 Retention of urine, unspecified: Secondary | ICD-10-CM | POA: Diagnosis not present

## 2021-07-31 ENCOUNTER — Other Ambulatory Visit: Payer: Self-pay | Admitting: Family Medicine

## 2021-07-31 DIAGNOSIS — G894 Chronic pain syndrome: Secondary | ICD-10-CM

## 2021-07-31 DIAGNOSIS — F988 Other specified behavioral and emotional disorders with onset usually occurring in childhood and adolescence: Secondary | ICD-10-CM

## 2021-07-31 NOTE — Telephone Encounter (Signed)
Requested medication (s) are due for refill today - yes  Requested medication (s) are on the active medication list -yes  Future visit scheduled -yes  Last refill: 07/05/21  Notes to clinic: Request RF: non delegated Rx  Requested Prescriptions  Pending Prescriptions Disp Refills   amphetamine-dextroamphetamine (ADDERALL) 15 MG tablet [Pharmacy Med Name: AMPHETAMINE-DEXTROAMPHETAMINE 15 MG] 60 tablet     Sig: TAKE ONE TABLET TWICE DAILY     Not Delegated - Psychiatry:  Stimulants/ADHD Failed - 07/31/2021  9:06 AM      Failed - This refill cannot be delegated      Failed - Urine Drug Screen completed in last 360 days      Failed - Valid encounter within last 3 months    Recent Outpatient Visits           6 months ago Cellulitis, unspecified cellulitis site   [facility] Malva Limes, MD   10 months ago Edema, unspecified type   [facility] Malva Limes, MD   12 months ago Dysuria   [facility] Chrismon, Jodell Cipro, PA-C   1 year ago Pressure injury of skin of buttock, unspecified injury stage, unspecified laterality   [facility] [place], Adriana M, PA-C   1 year ago Urinary tract infection associated with catheterization of urinary tract, unspecified indwelling urinary catheter type, subsequent encounter   [facility] Chrismon, Jodell Cipro, PA-C       Future Appointments             In 3 months Fisher, Demetrios Isaacs, MD [facility]             oxyCODONE (ROXICODONE) 15 MG immediate release tablet [Pharmacy Med Name: OXYCODONE HCL 15 MG TAB] 120 tablet     Sig: TAKE ONE TABLET BY MOUTH EVERY 6 HOURS AS NEEDED FOR PAIN     Not Delegated - Analgesics:  Opioid Agonists Failed - 07/31/2021  9:06 AM      Failed - This refill cannot be delegated      Failed - Urine Drug Screen completed in last 360 days      Failed - Valid encounter within last 6 months    Recent Outpatient  Visits           6 months ago Cellulitis, unspecified cellulitis site   [facility] Malva Limes, MD   10 months ago Edema, unspecified type   [facility] Malva Limes, MD   12 months ago Dysuria   [facility] Chrismon, Jodell Cipro, PA-C   1 year ago Pressure injury of skin of buttock, unspecified injury stage, unspecified laterality   [facility] [place], Adriana M, PA-C   1 year ago Urinary tract infection associated with catheterization of urinary tract, unspecified indwelling urinary catheter type, subsequent encounter   [facility] Chrismon, Jodell Cipro, PA-C       Future Appointments             In 3 months Fisher, Demetrios Isaacs, MD [facility]             methadone (DOLOPHINE) 10 MG tablet [Pharmacy Med Name: METHADONE HCL 10 MG TAB] 240 tablet     Sig: TAKE TWO TABLETS BY MOUTH EVERY 6 HOURS     Not Delegated - Analgesics:  Opioid Agonists Failed - 07/31/2021  9:06 AM      Failed - This refill cannot be  delegated      Failed - Urine Drug Screen completed in last 360 days      Failed - Valid encounter within last 6 months    Recent Outpatient Visits           6 months ago Cellulitis, unspecified cellulitis site   [facility] Malva Limes, MD   10 months ago Edema, unspecified type   [facility] Malva Limes, MD   12 months ago Dysuria   [facility] Chrismon, Jodell Cipro, PA-C   1 year ago Pressure injury of skin of buttock, unspecified injury stage, unspecified laterality   [facility] [place], Adriana M, PA-C   1 year ago Urinary tract infection associated with catheterization of urinary tract, unspecified indwelling urinary catheter type, subsequent encounter   [facility] Chrismon, Jodell Cipro, PA-C       Future Appointments             In 3 months Fisher, Demetrios Isaacs, MD [facility]               Requested Prescriptions  Pending Prescriptions Disp Refills   amphetamine-dextroamphetamine (ADDERALL) 15 MG tablet [Pharmacy Med Name: AMPHETAMINE-DEXTROAMPHETAMINE 15 MG] 60 tablet     Sig: TAKE ONE TABLET TWICE DAILY     Not Delegated - Psychiatry:  Stimulants/ADHD Failed - 07/31/2021  9:06 AM      Failed - This refill cannot be delegated      Failed - Urine Drug Screen completed in last 360 days      Failed - Valid encounter within last 3 months    Recent Outpatient Visits           6 months ago Cellulitis, unspecified cellulitis site   [facility] Malva Limes, MD   10 months ago Edema, unspecified type   [facility] Malva Limes, MD   12 months ago Dysuria   [facility] Chrismon, Jodell Cipro, PA-C   1 year ago Pressure injury of skin of buttock, unspecified injury stage, unspecified laterality   [facility] [place], Adriana M, PA-C   1 year ago Urinary tract infection associated with catheterization of urinary tract, unspecified indwelling urinary catheter type, subsequent encounter   [facility] Chrismon, Jodell Cipro, PA-C       Future Appointments             In 3 months Fisher, Demetrios Isaacs, MD [facility]             oxyCODONE (ROXICODONE) 15 MG immediate release tablet [Pharmacy Med Name: OXYCODONE HCL 15 MG TAB] 120 tablet     Sig: TAKE ONE TABLET BY MOUTH EVERY 6 HOURS AS NEEDED FOR PAIN     Not Delegated - Analgesics:  Opioid Agonists Failed - 07/31/2021  9:06 AM      Failed - This refill cannot be delegated      Failed - Urine Drug Screen completed in last 360 days      Failed - Valid encounter within last 6 months    Recent Outpatient Visits           6 months ago Cellulitis, unspecified cellulitis site   [facility] Malva Limes, MD   10 months ago Edema, unspecified type   [facility]  Malva Limes, MD   12 months ago Dysuria   [facility] Chrismon, Jodell Cipro, [place]  1 year ago Pressure injury of skin of buttock, unspecified injury stage, unspecified laterality   [facility] [place], Adriana M, PA-C   1 year ago Urinary tract infection associated with catheterization of urinary tract, unspecified indwelling urinary catheter type, subsequent encounter   [facility] Chrismon, Jodell Cipro, PA-C       Future Appointments             In 3 months Fisher, Demetrios Isaacs, MD [facility], [facility]             methadone (DOLOPHINE) 10 MG tablet [Pharmacy Med Name: METHADONE HCL 10 MG TAB] 240 tablet     Sig: TAKE TWO TABLETS BY MOUTH EVERY 6 HOURS     Not Delegated - Analgesics:  Opioid Agonists Failed - 07/31/2021  9:06 AM      Failed - This refill cannot be delegated      Failed - Urine Drug Screen completed in last 360 days      Failed - Valid encounter within last 6 months    Recent Outpatient Visits           6 months ago Cellulitis, unspecified cellulitis site   [facility] Malva Limes, MD   10 months ago Edema, unspecified type   [facility] Malva Limes, MD   12 months ago Dysuria   [facility] Chrismon, Jodell Cipro, PA-C   1 year ago Pressure injury of skin of buttock, unspecified injury stage, unspecified laterality   [facility] [place], [place], PA-C   1 year ago Urinary tract infection associated with catheterization of urinary tract, unspecified indwelling urinary catheter type, subsequent encounter   [facility] Chrismon, Jodell Cipro, PA-C       Future Appointments             In 3 months Fisher, Demetrios Isaacs, MD [facility]

## 2021-08-09 ENCOUNTER — Telehealth: Payer: Self-pay | Admitting: Family Medicine

## 2021-08-09 NOTE — Telephone Encounter (Signed)
Patient called in to leave this information for Dr to Cynda Familia  [email or web address]  and fax # [phone]  in case there are any questions please call patient at Ph# [phone]

## 2021-08-09 NOTE — Telephone Encounter (Signed)
Pt wants the dr to know he has a new backup company, [employer] that he will get his foley's from,if he needs. Pt has ordered and he wants you to know it is OK to send to them. It is called Duett balloon foley catheter.   Pt wants to know if you have seen anything from this company. If not by next week, please call the pt.  This is not to replace the other catheter company, [employer], they just do not carry the duett balloon foley.

## 2021-08-14 NOTE — Telephone Encounter (Signed)
FYI

## 2021-08-21 NOTE — Telephone Encounter (Signed)
Pt wanting to know if Dr [name] can speed up the process of getting this new script from [employer] [facility] [phone]

## 2021-08-22 DIAGNOSIS — R339 Retention of urine, unspecified: Secondary | ICD-10-CM | POA: Diagnosis not present

## 2021-08-23 ENCOUNTER — Telehealth: Payer: Self-pay | Admitting: Family Medicine

## 2021-08-23 NOTE — Telephone Encounter (Signed)
Order faxed.

## 2021-08-23 NOTE — Telephone Encounter (Signed)
Have printed order, please fax.

## 2021-08-23 NOTE — Telephone Encounter (Signed)
Justin [name] with [employer], requesting prescription for foley catheter 18 fr

## 2021-08-29 ENCOUNTER — Other Ambulatory Visit: Payer: Self-pay | Admitting: Family Medicine

## 2021-08-29 DIAGNOSIS — G894 Chronic pain syndrome: Secondary | ICD-10-CM

## 2021-08-29 NOTE — Telephone Encounter (Signed)
Requested medications are due for refill today.  yes  Requested medications are on the active medications list.  yes  Last refill. 08/03/2021 for both  Future visit scheduled.   yes  Notes to clinic.  Medications are not delegated.    Requested Prescriptions  Pending Prescriptions Disp Refills   oxyCODONE (ROXICODONE) 15 MG immediate release tablet [Pharmacy Med Name: OXYCODONE HCL 15 MG TAB] 120 tablet     Sig: TAKE ONE TABLET BY MOUTH EVERY 6 HOURS AS NEEDED FOR PAIN     Not Delegated - Analgesics:  Opioid Agonists Failed - 08/29/2021  4:02 PM      Failed - This refill cannot be delegated      Failed - Urine Drug Screen completed in last 360 days      Failed - Valid encounter within last 6 months    Recent Outpatient Visits           7 months ago Cellulitis, unspecified cellulitis site   [facility] Malva Limes, MD   11 months ago Edema, unspecified type   [facility] Malva Limes, MD   1 year ago Dysuria   [facility] Chrismon, Jodell Cipro, PA-C   1 year ago Pressure injury of skin of buttock, unspecified injury stage, unspecified laterality   [facility] [place], Adriana M, PA-C   1 year ago Urinary tract infection associated with catheterization of urinary tract, unspecified indwelling urinary catheter type, subsequent encounter   [facility] Chrismon, Jodell Cipro, PA-C       Future Appointments             In 2 months Fisher, Demetrios Isaacs, MD [facility]             methadone (DOLOPHINE) 10 MG tablet [Pharmacy Med Name: METHADONE HCL 10 MG TAB] 240 tablet     Sig: TAKE TWO TABLETS BY MOUTH EVERY 6 HOURS     Not Delegated - Analgesics:  Opioid Agonists Failed - 08/29/2021  4:02 PM      Failed - This refill cannot be delegated      Failed - Urine Drug Screen completed in last 360 days      Failed - Valid encounter within last 6 months    Recent Outpatient Visits            7 months ago Cellulitis, unspecified cellulitis site   [facility] Malva Limes, MD   11 months ago Edema, unspecified type   [facility] Malva Limes, MD   1 year ago Dysuria   [facility] Chrismon, Jodell Cipro, PA-C   1 year ago Pressure injury of skin of buttock, unspecified injury stage, unspecified laterality   [facility] [place], [place], PA-C   1 year ago Urinary tract infection associated with catheterization of urinary tract, unspecified indwelling urinary catheter type, subsequent encounter   [facility] Chrismon, Jodell Cipro, PA-C       Future Appointments             In 2 months Fisher, Demetrios Isaacs, MD [facility]

## 2021-09-01 ENCOUNTER — Other Ambulatory Visit: Payer: Self-pay | Admitting: Family Medicine

## 2021-09-01 DIAGNOSIS — F988 Other specified behavioral and emotional disorders with onset usually occurring in childhood and adolescence: Secondary | ICD-10-CM

## 2021-09-01 DIAGNOSIS — R110 Nausea: Secondary | ICD-10-CM

## 2021-09-01 MED ORDER — PROMETHAZINE HCL 25 MG RE SUPP: 25.0000 mg | Freq: Four times a day (QID) | RECTAL | 3 refills | Status: DC | PRN

## 2021-09-01 NOTE — Telephone Encounter (Signed)
Patient called in asking for some of the promethazine (PHENERGAN) 25 MG tablet to be changed to supposotories.

## 2021-09-07 ENCOUNTER — Ambulatory Visit: Payer: Self-pay

## 2021-09-07 NOTE — Telephone Encounter (Signed)
Patient called in to inform Dr [name] that he may have a kidney infection. Say that he is having pain in the kidney area about 3 days also complains of increased leg spasms which usually happen with a kidney infections,has some chills but per patient it may be nothing. Patient want to bring a urine sample to office to be checked  Please call Ph# [phone]    Chief Complaint: UTI Symptoms: Strong smelling urine, back and side pain, chills Frequency: Started 3 days ago Pertinent Negatives: Patient denies Fever Disposition: [] ED /[] Urgent Care (no appt availability in office) / [] Appointment(In office/virtual)/ []  [facility]/ [] Home Care/ [] Refused Recommended Disposition  Additional Notes: Asking if he can bring a urine specimen. Please advise.     Answer Assessment - Initial Assessment Questions 1. SYMPTOM: "What's the main symptom you're concerned about?" (e.g., frequency, incontinence)     Strong urine, leg spasms, pain back 2. ONSET: "When did the  symptoms  start?"     3 days 3. PAIN: "Is there any pain?" If Yes, ask: "How bad is it?" (Scale: 1-10; mild, moderate, severe)     Moderate 4. CAUSE: "What do you think is causing the symptoms?"     UTI 5. OTHER SYMPTOMS: "Do you have any other symptoms?" (e.g., fever, flank pain, blood in urine, pain with urination)     Back and side pain, chills 6. PREGNANCY: "Is there any chance you are pregnant?" "When was your last menstrual period?"     N/a  Protocols used: Urinary Symptoms-A-AH

## 2021-09-07 NOTE — Telephone Encounter (Signed)
Please review for Dr. Fisher.   Thanks,   -Rahmah Mccamy  

## 2021-09-19 ENCOUNTER — Encounter: Payer: Self-pay | Admitting: Family Medicine

## 2021-09-19 ENCOUNTER — Ambulatory Visit (INDEPENDENT_AMBULATORY_CARE_PROVIDER_SITE_OTHER): Payer: Medicare HMO | Admitting: Family Medicine

## 2021-09-19 ENCOUNTER — Other Ambulatory Visit: Payer: Self-pay

## 2021-09-19 VITALS — BP 137/86 | HR 104 | Temp 98.4°F

## 2021-09-19 DIAGNOSIS — F988 Other specified behavioral and emotional disorders with onset usually occurring in childhood and adolescence: Secondary | ICD-10-CM

## 2021-09-19 DIAGNOSIS — L89311 Pressure ulcer of right buttock, stage 1: Secondary | ICD-10-CM

## 2021-09-19 DIAGNOSIS — M21952 Unspecified acquired deformity of left thigh: Secondary | ICD-10-CM

## 2021-09-19 DIAGNOSIS — R300 Dysuria: Secondary | ICD-10-CM

## 2021-09-19 DIAGNOSIS — E039 Hypothyroidism, unspecified: Secondary | ICD-10-CM | POA: Diagnosis not present

## 2021-09-19 DIAGNOSIS — G8220 Paraplegia, unspecified: Secondary | ICD-10-CM | POA: Diagnosis not present

## 2021-09-19 DIAGNOSIS — R69 Illness, unspecified: Secondary | ICD-10-CM | POA: Diagnosis not present

## 2021-09-19 DIAGNOSIS — N390 Urinary tract infection, site not specified: Secondary | ICD-10-CM | POA: Diagnosis not present

## 2021-09-19 DIAGNOSIS — R7401 Elevation of levels of liver transaminase levels: Secondary | ICD-10-CM | POA: Diagnosis not present

## 2021-09-19 DIAGNOSIS — F1190 Opioid use, unspecified, uncomplicated: Secondary | ICD-10-CM

## 2021-09-19 LAB — POCT URINALYSIS DIPSTICK
Bilirubin, UA: NEGATIVE
Glucose, UA: NEGATIVE
Ketones, UA: NEGATIVE
Spec Grav, UA: 1.01 (ref 1.010–1.025)
pH, UA: 7.5 (ref 5.0–8.0)

## 2021-09-19 MED ORDER — CEFDINIR 300 MG PO CAPS: 300.0000 mg | ORAL_CAPSULE | Freq: Two times a day (BID) | ORAL | 0 refills | Status: AC

## 2021-09-19 NOTE — Patient Instructions (Signed)
Please review the attached list of medications and notify my office if there are any errors.   Go to the [facility] for hip Xray
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
## 2021-09-21 ENCOUNTER — Ambulatory Visit: Payer: Self-pay | Admitting: *Deleted

## 2021-09-21 LAB — URINE CULTURE

## 2021-09-21 LAB — SPECIMEN STATUS REPORT

## 2021-09-21 NOTE — Telephone Encounter (Signed)
Reason for Disposition  Unable to have a bowel movement (BM) without laxative or enema  Answer Assessment - Initial Assessment Questions 1. STOOL PATTERN OR FREQUENCY: "How often do you have a bowel movement (BM)?"  (Normal range: 3 times a day to every 3 days)  "When was your last BM?"       I returned his call.  C/o constipation.   I'm having a hard time keeping it consistently hard or soft.   I have a bowel program due to paralysis.   I need to Dr. [name] I might have IBS.   Sometimes I have to do an enema.   The second enema does not come out that I use to rinse the soap out. Can I do a video   visit.    2. STRAINING: "Do you have to strain to have a BM?"      *No Answer* 3. RECTAL PAIN: "Does your rectum hurt when the stool comes out?" If Yes, ask: "Do you have hemorrhoids? How bad is the pain?"  (Scale 1-10; or mild, moderate, severe)     No abd pain.    I have a popping in bowels.   I have a bladder infection so my back is hurting but I saw him for that already. 4. STOOL COMPOSITION: "Are the stools hard?"      *No Answer* 5. BLOOD ON STOOLS: "Has there been any blood on the toilet tissue or on the surface of the BM?" If Yes, ask: "When was the last time?"      *No Answer* 6. CHRONIC CONSTIPATION: "Is this a new problem for you?"  If no, ask: "How long have you had this problem?" (days, weeks, months)      *No Answer* 7. CHANGES IN DIET OR HYDRATION: "Have there been any recent changes in your diet?" "How much fluids are you drinking on a daily basis?"  "How much have you had to drink today?"     *No Answer* 8. MEDICATIONS: "Have you been taking any new medications?" "Are you taking any narcotic pain medications?" (e.g., Vicodin, Percocet, morphine, Dilaudid)     *No Answer* 9. LAXATIVES: "Have you been using any stool softeners, laxatives, or enemas?"  If yes, ask "What, how often, and when was the last time?"     *No Answer* 10. ACTIVITY:  "How much walking do you do every day?"  "Has  your activity level decreased in the past week?"        *No Answer* 11. CAUSE: "What do you think is causing the constipation?"        *No Answer* 12. OTHER SYMPTOMS: "Do you have any other symptoms?" (e.g., abdominal pain, bloating, fever, vomiting)       *No Answer* 13. MEDICAL HISTORY: "Do you have a history of hemorrhoids, rectal fissures, or rectal surgery or rectal abscess?"         *No Answer* 14. PREGNANCY: "Is there any chance you are pregnant?" "When was your last menstrual period?"       *No Answer*  Protocols used: Constipation-A-AH

## 2021-09-21 NOTE — Telephone Encounter (Signed)
°  Chief Complaint: on going constipation (has paralysis) Symptoms: having to use enemas to have a BM.  Frequency: last BM with an enema was Tues. Pertinent Negatives: Patient denies abd pain or abd distention  Disposition: [] ED /[] Urgent Care (no appt availability in office) / [x] Appointment(In office/virtual)/ []  [facility]/ [] Home Care/ [] Refused Recommended Disposition /[] [facility]/ []  Follow-up with PCP Additional Notes:

## 2021-09-25 DIAGNOSIS — R339 Retention of urine, unspecified: Secondary | ICD-10-CM | POA: Diagnosis not present

## 2021-09-26 ENCOUNTER — Ambulatory Visit: Payer: Medicare HMO | Admitting: Urology

## 2021-09-26 ENCOUNTER — Telehealth (INDEPENDENT_AMBULATORY_CARE_PROVIDER_SITE_OTHER): Payer: Medicare HMO | Admitting: Family Medicine

## 2021-09-26 ENCOUNTER — Encounter: Payer: Self-pay | Admitting: Family Medicine

## 2021-09-26 DIAGNOSIS — R109 Unspecified abdominal pain: Secondary | ICD-10-CM

## 2021-09-26 DIAGNOSIS — K5909 Other constipation: Secondary | ICD-10-CM

## 2021-09-26 NOTE — Progress Notes (Signed)
MyChart Video Visit    Virtual Visit via Video Note   This visit type was conducted due to national recommendations for restrictions regarding the COVID-19 Pandemic (e.g. social distancing) in an effort to limit this patient's exposure and mitigate transmission in our community. This patient is at least at moderate risk for complications without adequate follow up. This format is felt to be most appropriate for this patient at this time. Physical exam was limited by quality of the video and audio technology used for the visit.   Patient location: home Provider location: [facility]  I discussed the limitations of evaluation and management by telemedicine and the availability of in person appointments. The patient expressed understanding and agreed to proceed.  Patient: Justin [name]   DOB: 05-Dec-1980   41 y.o. Male  MRN: [id number] Visit Date: 09/26/2021  Today's healthcare provider: Mila Merry, MD   No chief complaint on file.  Subjective    Constipation This is a chronic problem. Episode onset: On going issue. The problem has been gradually worsening since onset. The patient is on a high fiber diet. There has Been adequate water intake. Associated symptoms include bloating. Pertinent negatives include no abdominal pain, anorexia, back pain, diarrhea or rectal pain. Risk factors include immobility.    Pt would like a referral to GI. Had been followed by GI for several years, but stopped since he stopped having trouble for while. Has been on Linzess for a few years. He previously been on Amitiza which seemed to work better, but is not on [employer]. Also complains of popping noises from his abdomen and general abdominal discomfort.    Medications: Outpatient Medications Prior to Visit  Medication Sig   albuterol (PROVENTIL HFA;VENTOLIN HFA) 108 (90 Base) MCG/ACT inhaler Inhale 2 puffs into the lungs every 6 (six) hours as needed for wheezing or shortness of breath.    alprazolam (XANAX) 2 MG tablet TAKE ONE TABLET EVERY SIX HOURS IF NEEDED   amphetamine-dextroamphetamine (ADDERALL) 15 MG tablet TAKE ONE TABLET TWICE DAILY   azelastine (OPTIVAR) 0.05 % ophthalmic solution Place 1 drop into both eyes 2 (two) times daily.   baclofen (LIORESAL) 20 MG tablet TAKE ONE TABLET FOUR TIMES DAILY   benzonatate (TESSALON PERLES) 100 MG capsule Take 1 capsule (100 mg total) by mouth 3 (three) times daily as needed for cough (cough).   cefdinir (OMNICEF) 300 MG capsule Take 1 capsule (300 mg total) by mouth 2 (two) times daily for 14 days.   clotrimazole (LOTRIMIN) 1 % cream Apply 1 application topically 2 (two) times daily.   clotrimazole-betamethasone (LOTRISONE) cream Apply 1 application topically 2 (two) times daily.   diphenhydrAMINE (BENADRYL) 25 MG tablet Take 1 tablet by mouth as needed.   docusate sodium (COLACE) 250 MG capsule Take 1 capsule (250 mg total) by mouth 4 (four) times daily as needed.   dronabinol (MARINOL) 10 MG capsule Take 1 capsule (10 mg total) by mouth every 6 (six) hours as needed.   flavoxATE (URISPAS) 100 MG tablet Take 1 tablet (100 mg total) by mouth 3 (three) times daily as needed for bladder spasms.   fluticasone (FLONASE) 50 MCG/ACT nasal spray Place 2 sprays into both nostrils daily.   gabapentin (NEURONTIN) 800 MG tablet Take 1 tablet (800 mg total) by mouth 4 (four) times daily.   levothyroxine (SYNTHROID) 88 MCG tablet TAKE ONE TABLET BY MOUTH EVERY DAY   lidocaine (LMX) 4 % cream Apply 1 application topically 3 (three) times daily.  LINZESS 145 MCG CAPS capsule TAKE 1 CAPSULE BY MOUTH DAILY BEFORE BREAKFAST.   methadone (DOLOPHINE) 10 MG tablet TAKE TWO TABLETS BY MOUTH EVERY 6 HOURS   Mineral Oil OIL daily.   naproxen (NAPROSYN) 500 MG tablet TAKE ONE TABLET TWICE DAILY   omeprazole (PRILOSEC) 20 MG capsule TAKE 1 CAPSULE BY MOUTH ONCE DAILY   oxybutynin (DITROPAN) 5 MG tablet Take 5 mg by mouth.   oxyCODONE (ROXICODONE) 15 MG  immediate release tablet TAKE ONE TABLET BY MOUTH EVERY 6 HOURS AS NEEDED FOR PAIN   PARoxetine (PAXIL) 30 MG tablet TAKE 2 TABLETS BY MOUTH DAILY   phenazopyridine (PYRIDIUM) 200 MG tablet Take 1 tablet (200 mg total) by mouth 3 (three) times daily.   polyethylene glycol powder (QC NATURA-LAX) 17 GM/SCOOP powder MIX 1 CAPFUL IN 8 0Z OF WATER OR JUICE AND DRINK ONCE A DAY   promethazine (PHENERGAN) 25 MG suppository Place 1 suppository (25 mg total) rectally every 6 (six) hours as needed for nausea or vomiting.   promethazine (PHENERGAN) 25 MG tablet TAKE 1 TABLET BY MOUTH EVERY 4 HOURS AS NEEDED FOR NAUSEA/VOMITING   sildenafil (REVATIO) 20 MG tablet Take 3 to 5 tablets two hours before intercouse on an empty stomach.  Do not take with nitrates.   silver sulfADIAZINE (SSD) 1 % cream APPLY TO AFFECTED AREAS TOPICALLY TWICE DAILY   tiZANidine (ZANAFLEX) 4 MG tablet TAKE 2 TABLETS BY MOUTH 4 TIMES DAILY   VESICARE 10 MG tablet TAKE 1 TABLET BY MOUTH DAILY   Facility-Administered Medications Prior to Visit  Medication Dose Route Frequency Provider   methylPREDNISolone acetate (DEPO-MEDROL) injection 40 mg  40 mg Intramuscular Once Malva Limes, MD    Review of Systems  Gastrointestinal:  Positive for bloating and constipation. Negative for abdominal pain, anorexia, diarrhea and rectal pain.  Musculoskeletal:  Negative for back pain.     Objective    There were no vitals taken for this visit.   Physical Exam  Awake, alert, oriented x 3. In no apparent distress    Assessment & Plan     1. Chronic constipation Likely secondary to chronic high dose opioids. Did better on Amitiza then Linzess, but Amitiza is not covered by his insurance. He would like to have this by GI as he did in the past.  - Ambulatory referral to Gastroenterology  2. Abdominal discomfort  - Ambulatory referral to Gastroenterology        I discussed the assessment and treatment plan with the patient. The  patient was provided an opportunity to ask questions and all were answered. The patient agreed with the plan and demonstrated an understanding of the instructions.   The patient was advised to call back or seek an in-person evaluation if the symptoms worsen or if the condition fails to improve as anticipated.  I provided 9 minutes of non-face-to-face time during this encounter.  The entirety of the information documented in the History of Present Illness, Review of Systems and Physical Exam were personally obtained by me. Portions of this information were initially documented by the CMA and reviewed by me for thoroughness and accuracy.    Mila Merry, MD [facility] [phone] (phone) [phone] (fax)  [facility]

## 2021-09-26 NOTE — Patient Instructions (Signed)
Please review the attached list of medications and notify my office if there are any errors.   Call [facility] at [phone] if you do not get a call from them within a week.

## 2021-09-29 ENCOUNTER — Other Ambulatory Visit: Payer: Self-pay | Admitting: Family Medicine

## 2021-09-29 NOTE — Telephone Encounter (Signed)
Requested medication (s) are due for refill today: yes  Requested medication (s) are on the active medication list: no  Last refill:  08/30/21  Future visit scheduled: yes  Notes to clinic:  not on pt's med list. Please advise     Requested Prescriptions  Pending Prescriptions Disp Refills   TOVIAZ 8 MG TB24 tablet [Pharmacy Med Name: TOVIAZ 8 MG TAB] 30 tablet     Sig: TAKE 1 TABLET BY MOUTH DAILY     Urology:  Bladder Agents Passed - 09/29/2021 12:15 PM      Passed - Valid encounter within last 12 months    Recent Outpatient Visits           3 days ago Chronic constipation   [facility] Malva Limes, MD   1 week ago Dysuria   [facility] Malva Limes, MD   8 months ago Cellulitis, unspecified cellulitis site   [facility] Malva Limes, MD   1 year ago Edema, unspecified type   [facility] Malva Limes, MD   1 year ago Dysuria   [facility] Chrismon, Jodell Cipro, PA-C       Future Appointments             In 4 days Sondra Come, MD [facility] [place]   In 1 month Fisher, Demetrios Isaacs, MD [facility]            Signed Prescriptions Disp Refills   LINZESS 145 MCG CAPS capsule 90 capsule 0    Sig: TAKE 1 CAPSULE BY MOUTH DAILY BEFORE BREAKFAST.     Gastroenterology: Irritable Bowel Syndrome Passed - 09/29/2021 12:15 PM      Passed - Valid encounter within last 12 months    Recent Outpatient Visits           3 days ago Chronic constipation   [facility] Malva Limes, MD   1 week ago Dysuria   [facility] Malva Limes, MD   8 months ago Cellulitis, unspecified cellulitis site   [facility] Malva Limes, MD   1 year ago Edema, unspecified type   [facility] Malva Limes, MD   1 year ago Dysuria   [facility] Chrismon, Jodell Cipro, PA-C        Future Appointments             In 4 days Richardo Hanks, Laurette Schimke, MD [facility] [place]   In 1 month Fisher, Demetrios Isaacs, MD [facility]

## 2021-09-29 NOTE — Telephone Encounter (Signed)
Requested Prescriptions  Pending Prescriptions Disp Refills   LINZESS 145 MCG CAPS capsule [Pharmacy Med Name: LINZESS 145 MCG CAP] 90 capsule 0    Sig: TAKE 1 CAPSULE BY MOUTH DAILY BEFORE BREAKFAST.     Gastroenterology: Irritable Bowel Syndrome Passed - 09/29/2021 12:15 PM      Passed - Valid encounter within last 12 months    Recent Outpatient Visits          3 days ago Chronic constipation   [facility] Malva Limes, MD   1 week ago Dysuria   [facility] Malva Limes, MD   8 months ago Cellulitis, unspecified cellulitis site   [facility] Malva Limes, MD   1 year ago Edema, unspecified type   [facility] Malva Limes, MD   1 year ago Dysuria   [facility] Chrismon, Jodell Cipro, PA-C      Future Appointments            In 4 days Richardo Hanks, Laurette Schimke, MD [facility] [place]   In 1 month Fisher, Demetrios Isaacs, MD [facility]            TOVIAZ 8 MG TB24 tablet [Pharmacy Med Name: TOVIAZ 8 MG TAB] 30 tablet     Sig: TAKE 1 TABLET BY MOUTH DAILY     Urology:  Bladder Agents Passed - 09/29/2021 12:15 PM      Passed - Valid encounter within last 12 months    Recent Outpatient Visits          3 days ago Chronic constipation   [facility] Malva Limes, MD   1 week ago Dysuria   [facility] Malva Limes, MD   8 months ago Cellulitis, unspecified cellulitis site   [facility] Malva Limes, MD   1 year ago Edema, unspecified type   [facility] Malva Limes, MD   1 year ago Dysuria   [facility] Chrismon, Jodell Cipro, PA-C      Future Appointments            In 4 days Richardo Hanks, Laurette Schimke, MD [facility] [place]   In 1 month Fisher, Demetrios Isaacs, MD [facility]

## 2021-10-03 ENCOUNTER — Other Ambulatory Visit: Payer: Self-pay | Admitting: Family Medicine

## 2021-10-03 ENCOUNTER — Ambulatory Visit: Payer: Medicare HMO | Admitting: Urology

## 2021-10-03 DIAGNOSIS — F988 Other specified behavioral and emotional disorders with onset usually occurring in childhood and adolescence: Secondary | ICD-10-CM

## 2021-10-03 DIAGNOSIS — L89309 Pressure ulcer of unspecified buttock, unspecified stage: Secondary | ICD-10-CM

## 2021-10-03 DIAGNOSIS — G894 Chronic pain syndrome: Secondary | ICD-10-CM

## 2021-10-03 DIAGNOSIS — J069 Acute upper respiratory infection, unspecified: Secondary | ICD-10-CM

## 2021-10-03 MED ORDER — LINACLOTIDE 145 MCG PO CAPS: 145.0000 ug | ORAL_CAPSULE | Freq: Every day | ORAL | 4 refills | Status: DC

## 2021-10-03 MED ORDER — ALBUTEROL SULFATE HFA 108 (90 BASE) MCG/ACT IN AERS: 2.0000 | INHALATION_SPRAY | Freq: Four times a day (QID) | RESPIRATORY_TRACT | 1 refills | Status: DC | PRN

## 2021-10-03 NOTE — Telephone Encounter (Signed)
Requested medication (s) are due for refill today: yess, all  Requested medication (s) are on the active medication list: yes, all    Last refill: Adderall12/16/22  #60  0 refills.     Methadone 08/30/21  #240  0 refills       Oxy 08/30/21  #120  0 refills  Future visit scheduled {Yes/[place] 10/31/21  Notes to clinic:Not delegated  Requested Prescriptions  Pending Prescriptions Disp Refills   amphetamine-dextroamphetamine (ADDERALL) 15 MG tablet [Pharmacy Med Name: AMPHETAMINE-DEXTROAMPHETAMINE 15 MG] 60 tablet     Sig: TAKE ONE TABLET TWICE DAILY     Not Delegated - Psychiatry:  Stimulants/ADHD Failed - 10/03/2021 10:49 AM      Failed - This refill cannot be delegated      Failed - Urine Drug Screen completed in last 360 days      Passed - Valid encounter within last 3 months    Recent Outpatient Visits           1 week ago Chronic constipation   [facility] Malva Limes, MD   2 weeks ago Dysuria   [facility] Malva Limes, MD   8 months ago Cellulitis, unspecified cellulitis site   [facility] Malva Limes, MD   1 year ago Edema, unspecified type   [facility] Malva Limes, MD   1 year ago Dysuria   [facility] Chrismon, Jodell Cipro, PA-C       Future Appointments             In 4 weeks Fisher, Demetrios Isaacs, MD [facility]             methadone (DOLOPHINE) 10 MG tablet [Pharmacy Med Name: METHADONE HCL 10 MG TAB] 240 tablet     Sig: TAKE TWO TABLETS BY MOUTH EVERY 6 HOURS     Not Delegated - Analgesics:  Opioid Agonists Failed - 10/03/2021 10:49 AM      Failed - This refill cannot be delegated      Failed - Urine Drug Screen completed in last 360 days      Passed - Valid encounter within last 6 months    Recent Outpatient Visits           1 week ago Chronic constipation   [facility] Malva Limes, MD   2 weeks ago Dysuria   [facility] Malva Limes, MD   8 months ago Cellulitis, unspecified cellulitis site   [facility] [name], Demetrios Isaacs, MD   1 year ago Edema, unspecified type   [facility] Malva Limes, MD   1 year ago Dysuria   [facility] Chrismon, Jodell Cipro, PA-C       Future Appointments             In 4 weeks Fisher, Demetrios Isaacs, MD [facility]             oxyCODONE (ROXICODONE) 15 MG immediate release tablet [Pharmacy Med Name: OXYCODONE HCL 15 MG TAB] 120 tablet     Sig: TAKE ONE TABLET BY MOUTH EVERY 6 HOURS AS NEEDED FOR PAIN     Not Delegated - Analgesics:  Opioid Agonists Failed - 10/03/2021 10:49 AM      Failed - This refill cannot be delegated      Failed - Urine Drug Screen completed in last 360 days      Passed -  Valid encounter within last 6 months    Recent Outpatient Visits           1 week ago Chronic constipation   [facility] Malva Limes, MD   2 weeks ago Dysuria   [facility] Malva Limes, MD   8 months ago Cellulitis, unspecified cellulitis site   [facility] Malva Limes, MD   1 year ago Edema, unspecified type   [facility] Malva Limes, MD   1 year ago Dysuria   [facility] Chrismon, Jodell Cipro, PA-C       Future Appointments             In 4 weeks Fisher, Demetrios Isaacs, MD [facility]

## 2021-10-04 ENCOUNTER — Telehealth: Payer: Self-pay

## 2021-10-04 MED ORDER — CLOTRIMAZOLE 1 % EX CREA: 1.0000 "application " | TOPICAL_CREAM | Freq: Two times a day (BID) | CUTANEOUS | 0 refills | Status: DC

## 2021-10-04 NOTE — Telephone Encounter (Signed)
Per covermymeds, prior authorization has been approved. Patient notified.

## 2021-10-04 NOTE — Telephone Encounter (Addendum)
Prior authorization completed via covermy meds. Awaiting response from insurance.   Message from covermymeds:  Your information has been submitted to [place] [employer] Part [employer]. [employer] will review the request and will issue a decision, typically within 1-3 days from your submission. You can check the updated outcome later by reopening this request.  If [employer] has not responded in 1-3 days or if you have any questions about your ePA request, please contact [place] [employer] Part [employer] at [phone]. If you think there may be a problem with your PA request, use our live chat feature at the bottom right.

## 2021-10-04 NOTE — Telephone Encounter (Signed)
Copied from CRM [id number]. Topic: General - Other >> Oct 04, 2021  1:12 PM Gaetana Michaelis A wrote: Reason for CRM: The patient has been directed to contact the practice regarding the prior authorization needed for their methadone (DOLOPHINE) 10 MG tablet [[id number]]   Please contact the patient further when possible regarding this matter

## 2021-10-16 ENCOUNTER — Ambulatory Visit: Payer: Self-pay | Admitting: *Deleted

## 2021-10-16 ENCOUNTER — Other Ambulatory Visit: Payer: Self-pay | Admitting: Family Medicine

## 2021-10-16 DIAGNOSIS — N390 Urinary tract infection, site not specified: Secondary | ICD-10-CM | POA: Diagnosis not present

## 2021-10-16 DIAGNOSIS — R300 Dysuria: Secondary | ICD-10-CM | POA: Diagnosis not present

## 2021-10-16 NOTE — Telephone Encounter (Signed)
°  Chief Complaint: urinary symptoms Symptoms: pain, spasms, chills, odor Frequency: 2 days Pertinent Negatives: Patient denies fever Disposition: [] ED /[] Urgent Care (no appt availability in office) / [] Appointment(In office/virtual)/ []  [facility]/ [] Home Care/ [] Refused Recommended Disposition /[] [facility]/ []  Follow-up with PCP Additional Notes: Patient request urine culture- he is handicapped and states the office normally lets his mother bring specimen

## 2021-10-16 NOTE — Telephone Encounter (Signed)
Summary: possible bladder inf   Pt called with a bladder infection.  He says his wife usually drops off a specimen.   CB#  [phone]  [phone]      Reason for Disposition  Bad or foul-smelling urine  Answer Assessment - Initial Assessment Questions 1. SYMPTOM: "What's the main symptom you're concerned about?" (e.g., frequency, incontinence)     Burning with urination, spasms, odor, chills 2. ONSET: "When did the  symptoms  start?"     2 days ago 3. PAIN: "Is there any pain?" If Yes, ask: "How bad is it?" (Scale: 1-10; mild, moderate, severe)     5 4. CAUSE: "What do you think is causing the symptoms?"     UTI 5. OTHER SYMPTOMS: "Do you have any other symptoms?" (e.g., fever, flank pain, blood in urine, pain with urination)     Pain in urination, spasm 6. PREGNANCY: "Is there any chance you are pregnant?" "When was your last menstrual period?"     *No Answer*  Protocols used: Urinary Symptoms-A-AH

## 2021-10-16 NOTE — Telephone Encounter (Signed)
Please advise 

## 2021-10-16 NOTE — Addendum Note (Signed)
Addended by: Benjiman Core on: 10/16/2021 02:14 PM   Modules accepted: Orders

## 2021-10-16 NOTE — Telephone Encounter (Addendum)
Urine specimen brought to the office by patient's wife. Per Dr. [name], OK to give two sterile urine sample cups. Urine sample sent off to [facility] for  culture.

## 2021-10-16 NOTE — Telephone Encounter (Signed)
Patient advised. He plans to have his wife drop off a sample asap. Patient requested that we give him 5-6 more sterile urine specimen cups to have at home. He says he is almost out and would like to have more sterile cups in case he needs to bring in another urine specimen in the future. Please advise if you would like for [place] to give patient urine specimen cups to keep at home.

## 2021-10-16 NOTE — Telephone Encounter (Signed)
That's fine

## 2021-10-17 ENCOUNTER — Encounter: Payer: Self-pay | Admitting: Family Medicine

## 2021-10-18 LAB — URINE CULTURE: Organism ID, Bacteria: NO GROWTH

## 2021-10-20 ENCOUNTER — Telehealth (INDEPENDENT_AMBULATORY_CARE_PROVIDER_SITE_OTHER): Payer: Medicare HMO | Admitting: Family Medicine

## 2021-10-20 ENCOUNTER — Encounter: Payer: Self-pay | Admitting: Family Medicine

## 2021-10-20 DIAGNOSIS — L89311 Pressure ulcer of right buttock, stage 1: Secondary | ICD-10-CM

## 2021-10-20 MED ORDER — DOXYCYCLINE MONOHYDRATE 100 MG PO CAPS: 100.0000 mg | ORAL_CAPSULE | Freq: Two times a day (BID) | ORAL | 0 refills | Status: DC

## 2021-10-20 NOTE — Progress Notes (Signed)
MyChart Video Visit    Virtual Visit via Video Note   This visit type was conducted due to national recommendations for restrictions regarding the COVID-19 Pandemic (e.g. social distancing) in an effort to limit this patient's exposure and mitigate transmission in our community. This patient is at least at moderate risk for complications without adequate follow up. This format is felt to be most appropriate for this patient at this time. Physical exam was limited by quality of the video and audio technology used for the visit.   Patient location: home Provider location: [facility]  I discussed the limitations of evaluation and management by telemedicine and the availability of in person appointments. The patient expressed understanding and agreed to proceed.  Patient: Justin [name]   DOB: September 20, 1980   41 y.o. Male  MRN: [id number] Visit Date: 10/20/2021  Today's healthcare provider: Mila Merry, MD   Chief Complaint  Patient presents with   Rash   leg spasm   Subjective    Rash This is a new problem. Episode onset: 1-2 weeks ago. Location: right buttocks. The rash is characterized by redness. Pertinent negatives include no fever, shortness of breath or vomiting.   He was seen earlier this month for same rash and prescribed cefdinir. He states that it mostly cleared with antibiotic but flared back up after finishing.    Medications: Outpatient Medications Prior to Visit  Medication Sig   albuterol (VENTOLIN HFA) 108 (90 Base) MCG/ACT inhaler Inhale 2 puffs into the lungs every 6 (six) hours as needed for wheezing or shortness of breath.   alprazolam (XANAX) 2 MG tablet TAKE ONE TABLET EVERY SIX HOURS IF NEEDED   amphetamine-dextroamphetamine (ADDERALL) 15 MG tablet TAKE ONE TABLET TWICE DAILY   azelastine (OPTIVAR) 0.05 % ophthalmic solution Place 1 drop into both eyes 2 (two) times daily.   baclofen (LIORESAL) 20 MG tablet TAKE ONE TABLET FOUR TIMES DAILY    clotrimazole (LOTRIMIN) 1 % cream Apply 1 application topically 2 (two) times daily.   clotrimazole-betamethasone (LOTRISONE) cream Apply 1 application topically 2 (two) times daily.   diphenhydrAMINE (BENADRYL) 25 MG tablet Take 1 tablet by mouth as needed.   docusate sodium (COLACE) 250 MG capsule Take 1 capsule (250 mg total) by mouth 4 (four) times daily as needed.   dronabinol (MARINOL) 10 MG capsule Take 1 capsule (10 mg total) by mouth every 6 (six) hours as needed.   flavoxATE (URISPAS) 100 MG tablet Take 1 tablet (100 mg total) by mouth 3 (three) times daily as needed for bladder spasms.   fluticasone (FLONASE) 50 MCG/ACT nasal spray Place 2 sprays into both nostrils daily.   gabapentin (NEURONTIN) 800 MG tablet Take 1 tablet (800 mg total) by mouth 4 (four) times daily.   levothyroxine (SYNTHROID) 88 MCG tablet TAKE ONE TABLET BY MOUTH EVERY DAY   lidocaine (LMX) 4 % cream Apply 1 application topically 3 (three) times daily.   linaclotide (LINZESS) 145 MCG CAPS capsule Take 1 capsule (145 mcg total) by mouth daily before breakfast.   methadone (DOLOPHINE) 10 MG tablet TAKE TWO TABLETS BY MOUTH EVERY 6 HOURS   Mineral Oil OIL daily.   naproxen (NAPROSYN) 500 MG tablet TAKE ONE TABLET TWICE DAILY   omeprazole (PRILOSEC) 20 MG capsule TAKE 1 CAPSULE BY MOUTH ONCE DAILY   oxybutynin (DITROPAN) 5 MG tablet Take 5 mg by mouth.   oxyCODONE (ROXICODONE) 15 MG immediate release tablet TAKE ONE TABLET BY MOUTH EVERY 6 HOURS AS NEEDED FOR  PAIN   PARoxetine (PAXIL) 30 MG tablet TAKE 2 TABLETS BY MOUTH DAILY   phenazopyridine (PYRIDIUM) 200 MG tablet Take 1 tablet (200 mg total) by mouth 3 (three) times daily.   polyethylene glycol powder (QC NATURA-LAX) 17 GM/SCOOP powder MIX 1 CAPFUL IN 8 0Z OF WATER OR JUICE AND DRINK ONCE A DAY   promethazine (PHENERGAN) 25 MG suppository Place 1 suppository (25 mg total) rectally every 6 (six) hours as needed for nausea or vomiting.   promethazine (PHENERGAN)  25 MG tablet TAKE 1 TABLET BY MOUTH EVERY 4 HOURS AS NEEDED FOR NAUSEA/VOMITING   sildenafil (REVATIO) 20 MG tablet Take 3 to 5 tablets two hours before intercouse on an empty stomach.  Do not take with nitrates.   silver sulfADIAZINE (SSD) 1 % cream APPLY TO AFFECTED AREAS TOPICALLY TWICE DAILY   tiZANidine (ZANAFLEX) 4 MG tablet TAKE 2 TABLETS BY MOUTH 4 TIMES DAILY   VESICARE 10 MG tablet TAKE 1 TABLET BY MOUTH DAILY   benzonatate (TESSALON PERLES) 100 MG capsule Take 1 capsule (100 mg total) by mouth 3 (three) times daily as needed for cough (cough). (Patient not taking: Reported on 10/20/2021)   Facility-Administered Medications Prior to Visit  Medication Dose Route Frequency Provider   methylPREDNISolone acetate (DEPO-MEDROL) injection 40 mg  40 mg Intramuscular Once Birdie Sons, MD    Review of Systems  Constitutional:  Negative for appetite change, chills and fever.  Respiratory:  Negative for chest tightness, shortness of breath and wheezing.   Cardiovascular:  Negative for chest pain and palpitations.  Gastrointestinal:  Negative for abdominal pain, nausea and vomiting.  Musculoskeletal:  Positive for myalgias (leg spasms).  Skin:  Positive for rash.     Objective    There were no vitals taken for this visit.   Physical Exam   Awake, alert, oriented x 3. In no apparent distress   Assessment & Plan     1. Pressure injury of right buttock, stage 1 Improved but never resolved with cefdinir which he has now finished. start doxycycline (MONODOX) 100 MG capsule; Take 1 capsule (100 mg total) by mouth 2 (two) times daily for 14 days.  Dispense: 28 capsule; Refill: 0   Call if symptoms change or if not rapidly improving.         I discussed the assessment and treatment plan with the patient. The patient was provided an opportunity to ask questions and all were answered. The patient agreed with the plan and demonstrated an understanding of the instructions.   The patient  was advised to call back or seek an in-person evaluation if the symptoms worsen or if the condition fails to improve as anticipated.  I provided 9 minutes of non-face-to-face time during this encounter.  The entirety of the information documented in the History of Present Illness, Review of Systems and Physical Exam were personally obtained by me. Portions of this information were initially documented by the CMA and reviewed by me for thoroughness and accuracy.    Lelon Huh, MD [facility] [phone] (phone) [phone] (fax)  [place]

## 2021-10-23 ENCOUNTER — Encounter: Payer: Self-pay | Admitting: Family Medicine

## 2021-10-24 DIAGNOSIS — R339 Retention of urine, unspecified: Secondary | ICD-10-CM | POA: Diagnosis not present

## 2021-10-25 ENCOUNTER — Encounter: Payer: Self-pay | Admitting: Family Medicine

## 2021-10-26 ENCOUNTER — Encounter: Payer: Self-pay | Admitting: Family Medicine

## 2021-10-26 ENCOUNTER — Other Ambulatory Visit: Payer: Self-pay | Admitting: Family Medicine

## 2021-10-26 DIAGNOSIS — G894 Chronic pain syndrome: Secondary | ICD-10-CM

## 2021-10-26 DIAGNOSIS — F988 Other specified behavioral and emotional disorders with onset usually occurring in childhood and adolescence: Secondary | ICD-10-CM

## 2021-10-26 DIAGNOSIS — L89309 Pressure ulcer of unspecified buttock, unspecified stage: Secondary | ICD-10-CM

## 2021-10-26 NOTE — Telephone Encounter (Signed)
Pt called this morning asking for a response to his my chart message.  He is send ing more pictures and want someone to take a look at them ° °CB#  [phone]   °

## 2021-10-26 NOTE — Telephone Encounter (Signed)
Requested medications are due for refill today.  unsure  Requested medications are on the active medications list.  no  Last refill. 10/07/2020  Future visit scheduled.   yes  Notes to clinic.  Med not on med list. Last refill 1 year ago.    Requested Prescriptions  Pending Prescriptions Disp Refills   fesoterodine (TOVIAZ) 8 MG TB24 tablet [Pharmacy Med Name: FESOTERODINE FUMARATE ER 8 MG TAB] 30 tablet     Sig: TAKE 1 TABLET BY MOUTH DAILY     Urology:  Bladder Agents 2 Failed - 10/26/2021  2:24 PM      Failed - Cr in normal range and within 360 days    Creatinine  Date Value Ref Range Status  06/28/2014 0.82 0.60 - 1.30 mg/dL Final   Creatinine, Ser  Date Value Ref Range Status  09/15/2020 0.80 0.76 - 1.27 mg/dL Final          Failed - ALT in normal range and within 360 days    ALT  Date Value Ref Range Status  09/15/2020 24 0 - 44 IU/L Final   SGPT (ALT)  Date Value Ref Range Status  06/25/2014 18 U/L Final    Comment:    14-63 NOTE: New Reference Range 04/06/14           Failed - AST in normal range and within 360 days    AST  Date Value Ref Range Status  09/15/2020 19 0 - 40 IU/L Final   SGOT(AST)  Date Value Ref Range Status  06/25/2014 15 15 - 37 Unit/L Final          Passed - Valid encounter within last 12 months    Recent Outpatient Visits           6 days ago Pressure injury of right buttock, stage 1   [facility] Malva Limes, MD   1 month ago Chronic constipation   [facility] Malva Limes, MD   1 month ago Dysuria   [facility] Malva Limes, MD   8 months ago Cellulitis, unspecified cellulitis site   [facility] Malva Limes, MD   1 year ago Edema, unspecified type   [facility] Fisher, Demetrios Isaacs, MD       Future Appointments             Tomorrow [name] Demetrios Isaacs, MD [facility]   In 5 days Fisher, Demetrios Isaacs, MD  [facility]

## 2021-10-26 NOTE — Telephone Encounter (Signed)
Pt called this morning asking for a response to his my chart message.  He is send ing more pictures and want someone to take a look at them  CB#  [phone]

## 2021-10-27 ENCOUNTER — Telehealth (INDEPENDENT_AMBULATORY_CARE_PROVIDER_SITE_OTHER): Payer: Medicare HMO | Admitting: Family Medicine

## 2021-10-27 ENCOUNTER — Encounter: Payer: Self-pay | Admitting: Family Medicine

## 2021-10-27 ENCOUNTER — Other Ambulatory Visit: Payer: Self-pay | Admitting: Family Medicine

## 2021-10-27 ENCOUNTER — Other Ambulatory Visit: Payer: Self-pay

## 2021-10-27 DIAGNOSIS — L89311 Pressure ulcer of right buttock, stage 1: Secondary | ICD-10-CM | POA: Diagnosis not present

## 2021-10-27 MED ORDER — CLOTRIMAZOLE 1 % EX CREA: 1.0000 "application " | TOPICAL_CREAM | Freq: Two times a day (BID) | CUTANEOUS | 0 refills | Status: DC

## 2021-10-27 MED ORDER — METHADONE HCL 10 MG PO TABS: 20.0000 mg | ORAL_TABLET | Freq: Four times a day (QID) | ORAL | 0 refills | Status: DC

## 2021-10-27 MED ORDER — AMPHETAMINE-DEXTROAMPHETAMINE 15 MG PO TABS: 1.0000 | ORAL_TABLET | Freq: Two times a day (BID) | ORAL | 0 refills | Status: DC

## 2021-10-27 MED ORDER — SULFAMETHOXAZOLE-TRIMETHOPRIM 800-160 MG PO TABS: 1.0000 | ORAL_TABLET | Freq: Two times a day (BID) | ORAL | 0 refills | Status: DC

## 2021-10-27 MED ORDER — DOXYCYCLINE MONOHYDRATE 100 MG PO CAPS: 100.0000 mg | ORAL_CAPSULE | Freq: Two times a day (BID) | ORAL | 0 refills | Status: AC

## 2021-10-27 MED ORDER — OXYCODONE HCL 15 MG PO TABS: 15.0000 mg | ORAL_TABLET | Freq: Four times a day (QID) | ORAL | 0 refills | Status: DC | PRN

## 2021-10-27 NOTE — Telephone Encounter (Signed)
Requested medications are due for refill today.  yes  Requested medications are on the active medications list.  yes  Last refill. 07/05/2021 #30 2 refills  Future visit scheduled.   yes  Notes to clinic.  Medication not delegated.    Requested Prescriptions  Pending Prescriptions Disp Refills   promethazine (PHENERGAN) 25 MG tablet [Pharmacy Med Name: PROMETHAZINE HCL 25 MG TAB] 30 tablet 2    Sig: TAKE 1 TABLET BY MOUTH EVERY 4 HOURS AS NEEDED FOR NAUSEA/VOMITING     Not Delegated - Gastroenterology: Antiemetics Failed - 10/27/2021  5:03 PM      Failed - This refill cannot be delegated      Passed - Valid encounter within last 6 months    Recent Outpatient Visits           Today Pressure injury of right buttock, stage 1   [facility] Birdie Sons, MD   1 week ago Pressure injury of right buttock, stage 1   [facility] Birdie Sons, MD   1 month ago Chronic constipation   [facility] Birdie Sons, MD   1 month ago [place], Donald E, MD   8 months ago Cellulitis, unspecified cellulitis site   [place], Kirstie Peri, MD       Future Appointments             In 4 days Fisher, Kirstie Peri, MD [facility], [place]

## 2021-10-27 NOTE — Progress Notes (Unsigned)
MyChart Video Visit    Virtual Visit via Video Note   This visit type was conducted due to national recommendations for restrictions regarding the COVID-19 Pandemic (e.g. social distancing) in an effort to limit this patient's exposure and mitigate transmission in our community. This patient is at least at moderate risk for complications without adequate follow up. This format is felt to be most appropriate for this patient at this time. Physical exam was limited by quality of the video and audio technology used for the visit.   Patient location: *** Provider location: ***  I discussed the limitations of evaluation and management by telemedicine and the availability of in person appointments. The patient expressed understanding and agreed to proceed.  Patient: Justin [name]   DOB: March 22, 1981   41 y.o. Male  MRN: [id number] Visit Date: 10/27/2021  Today's healthcare provider: Lelon Huh, MD   Chief Complaint  Patient presents with   Blister   Subjective    Rash This is a new problem. Episode onset: 6 days ago. The problem has been gradually worsening since onset. The affected locations include the left hand. The rash is characterized by blistering and redness. Pertinent negatives include no fever, shortness of breath or vomiting.   Patient states the blister became so large that he had to pop it. He has been using silver sulfadiazine & Neosporin, and keeping the area covered with sterile gauze.   Medications: Outpatient Medications Prior to Visit  Medication Sig   albuterol (VENTOLIN HFA) 108 (90 Base) MCG/ACT inhaler Inhale 2 puffs into the lungs every 6 (six) hours as needed for wheezing or shortness of breath.   alprazolam (XANAX) 2 MG tablet TAKE ONE TABLET EVERY SIX HOURS IF NEEDED   amphetamine-dextroamphetamine (ADDERALL) 15 MG tablet Take 1 tablet by mouth 2 (two) times daily.   azelastine (OPTIVAR) 0.05 % ophthalmic solution Place 1 drop into both eyes 2  (two) times daily.   baclofen (LIORESAL) 20 MG tablet TAKE ONE TABLET FOUR TIMES DAILY   clotrimazole (LOTRIMIN) 1 % cream Apply 1 application topically 2 (two) times daily.   clotrimazole-betamethasone (LOTRISONE) cream Apply 1 application topically 2 (two) times daily.   diphenhydrAMINE (BENADRYL) 25 MG tablet Take 1 tablet by mouth as needed.   docusate sodium (COLACE) 250 MG capsule Take 1 capsule (250 mg total) by mouth 4 (four) times daily as needed.   doxycycline (MONODOX) 100 MG capsule Take 1 capsule (100 mg total) by mouth 2 (two) times daily for 14 days.   dronabinol (MARINOL) 10 MG capsule Take 1 capsule (10 mg total) by mouth every 6 (six) hours as needed.   flavoxATE (URISPAS) 100 MG tablet Take 1 tablet (100 mg total) by mouth 3 (three) times daily as needed for bladder spasms.   fluticasone (FLONASE) 50 MCG/ACT nasal spray Place 2 sprays into both nostrils daily.   gabapentin (NEURONTIN) 800 MG tablet Take 1 tablet (800 mg total) by mouth 4 (four) times daily.   levothyroxine (SYNTHROID) 88 MCG tablet TAKE ONE TABLET BY MOUTH EVERY DAY   lidocaine (LMX) 4 % cream Apply 1 application topically 3 (three) times daily.   linaclotide (LINZESS) 145 MCG CAPS capsule Take 1 capsule (145 mcg total) by mouth daily before breakfast.   methadone (DOLOPHINE) 10 MG tablet Take 2 tablets (20 mg total) by mouth every 6 (six) hours. TAKE TWO TABLETS BY MOUTH EVERY 6 HOURS   Mineral Oil OIL daily.   naproxen (NAPROSYN) 500 MG tablet TAKE  ONE TABLET TWICE DAILY   omeprazole (PRILOSEC) 20 MG capsule TAKE 1 CAPSULE BY MOUTH ONCE DAILY   oxybutynin (DITROPAN) 5 MG tablet Take 5 mg by mouth.   oxyCODONE (ROXICODONE) 15 MG immediate release tablet Take 1 tablet (15 mg total) by mouth every 6 (six) hours as needed. for pain   PARoxetine (PAXIL) 30 MG tablet TAKE 2 TABLETS BY MOUTH DAILY   phenazopyridine (PYRIDIUM) 200 MG tablet Take 1 tablet (200 mg total) by mouth 3 (three) times daily.   polyethylene  glycol powder (QC NATURA-LAX) 17 GM/SCOOP powder MIX 1 CAPFUL IN 8 0Z OF WATER OR JUICE AND DRINK ONCE A DAY   promethazine (PHENERGAN) 25 MG suppository Place 1 suppository (25 mg total) rectally every 6 (six) hours as needed for nausea or vomiting.   promethazine (PHENERGAN) 25 MG tablet TAKE 1 TABLET BY MOUTH EVERY 4 HOURS AS NEEDED FOR NAUSEA/VOMITING   sildenafil (REVATIO) 20 MG tablet Take 3 to 5 tablets two hours before intercouse on an empty stomach.  Do not take with nitrates.   silver sulfADIAZINE (SSD) 1 % cream APPLY TO AFFECTED AREAS TOPICALLY TWICE DAILY   tiZANidine (ZANAFLEX) 4 MG tablet TAKE 2 TABLETS BY MOUTH 4 TIMES DAILY   VESICARE 10 MG tablet TAKE 1 TABLET BY MOUTH DAILY   Facility-Administered Medications Prior to Visit  Medication Dose Route Frequency Provider   methylPREDNISolone acetate (DEPO-MEDROL) injection 40 mg  40 mg Intramuscular Once Birdie Sons, MD    Review of Systems  Constitutional:  Negative for appetite change, chills and fever.  Respiratory:  Negative for chest tightness, shortness of breath and wheezing.   Cardiovascular:  Negative for chest pain and palpitations.  Gastrointestinal:  Negative for abdominal pain, nausea and vomiting.  Skin:  Positive for color change, rash and wound.   {Labs   Heme   Chem   Endocrine   Serology   Results Review (optional):23779}  Objective    There were no vitals taken for this visit. {Show previous vital signs (optional):23777}  Physical Exam     Assessment & Plan     ***  No follow-ups on file.     I discussed the assessment and treatment plan with the patient. The patient was provided an opportunity to ask questions and all were answered. The patient agreed with the plan and demonstrated an understanding of the instructions.   The patient was advised to call back or seek an in-person evaluation if the symptoms worsen or if the condition fails to improve as anticipated.  I provided *** minutes  of non-face-to-face time during this encounter.  {provider attestation***:1}  Lelon Huh, MD [facility] [phone] (phone) [phone] (fax)  [place]

## 2021-10-31 ENCOUNTER — Ambulatory Visit: Payer: Medicare HMO | Admitting: Family Medicine

## 2021-11-01 ENCOUNTER — Other Ambulatory Visit: Payer: Self-pay | Admitting: Family Medicine

## 2021-11-01 DIAGNOSIS — G894 Chronic pain syndrome: Secondary | ICD-10-CM

## 2021-11-01 DIAGNOSIS — F988 Other specified behavioral and emotional disorders with onset usually occurring in childhood and adolescence: Secondary | ICD-10-CM

## 2021-11-01 DIAGNOSIS — L89309 Pressure ulcer of unspecified buttock, unspecified stage: Secondary | ICD-10-CM

## 2021-11-01 NOTE — Telephone Encounter (Signed)
Epic will not allow me to close encounter since medications are controlled.

## 2021-11-02 MED ORDER — CLOTRIMAZOLE 1 % EX CREA: 1.0000 "application " | TOPICAL_CREAM | Freq: Two times a day (BID) | CUTANEOUS | 5 refills | Status: DC

## 2021-11-03 ENCOUNTER — Other Ambulatory Visit: Payer: Self-pay | Admitting: Family Medicine

## 2021-11-05 ENCOUNTER — Other Ambulatory Visit: Payer: Self-pay | Admitting: Family Medicine

## 2021-11-06 ENCOUNTER — Telehealth: Payer: Self-pay | Admitting: *Deleted

## 2021-11-06 ENCOUNTER — Encounter: Payer: Self-pay | Admitting: Family Medicine

## 2021-11-06 MED ORDER — SULFAMETHOXAZOLE-TRIMETHOPRIM 800-160 MG PO TABS: 1.0000 | ORAL_TABLET | Freq: Two times a day (BID) | ORAL | 0 refills | Status: AC

## 2021-11-06 NOTE — Telephone Encounter (Signed)
Left VM for pt to call back to clarify prescriber, ensure pt aware med needs to be refilled by urologist as noted

## 2021-11-06 NOTE — Telephone Encounter (Signed)
Pt returned call about medication. Pt notified that a new rx for Bactrin DS was called in, reviewed instructions, pt verbalized understanding.

## 2021-11-06 NOTE — Telephone Encounter (Signed)
Spoke with pharmacy, states last ordered 10/07/20 by Dr. [name]. Med is not on current med profile. Attempted to reach pt to clarify. Requested Prescriptions  Pending Prescriptions Disp Refills   fesoterodine (TOVIAZ) 8 MG TB24 tablet [Pharmacy Med Name: FESOTERODINE FUMARATE ER 8 MG TAB] 30 tablet     Sig: TAKE 1 TABLET BY MOUTH DAILY     Urology:  Bladder Agents 2 Failed - 11/03/2021  5:56 PM      Failed - Cr in normal range and within 360 days    Creatinine  Date Value Ref Range Status  06/28/2014 0.82 0.60 - 1.30 mg/dL Final   Creatinine, Ser  Date Value Ref Range Status  09/15/2020 0.80 0.76 - 1.27 mg/dL Final         Failed - ALT in normal range and within 360 days    ALT  Date Value Ref Range Status  09/15/2020 24 0 - 44 IU/L Final   SGPT (ALT)  Date Value Ref Range Status  06/25/2014 18 U/L Final    Comment:    14-63 NOTE: New Reference Range 04/06/14          Failed - AST in normal range and within 360 days    AST  Date Value Ref Range Status  09/15/2020 19 0 - 40 IU/L Final   SGOT(AST)  Date Value Ref Range Status  06/25/2014 15 15 - 37 Unit/L Final         Passed - Valid encounter within last 12 months    Recent Outpatient Visits          1 week ago Pressure injury of right buttock, stage 1   [facility] Malva Limes, MD   2 weeks ago Pressure injury of right buttock, stage 1   [facility] Malva Limes, MD   1 month ago Chronic constipation   [facility] Malva Limes, MD   1 month ago Dysuria   [facility] Malva Limes, MD   9 months ago Cellulitis, unspecified cellulitis site   [facility] [name], Demetrios Isaacs, MD

## 2021-11-13 ENCOUNTER — Other Ambulatory Visit: Payer: Self-pay

## 2021-11-13 ENCOUNTER — Encounter: Payer: Medicare HMO | Attending: Physician Assistant | Admitting: Physician Assistant

## 2021-11-13 DIAGNOSIS — L89312 Pressure ulcer of right buttock, stage 2: Secondary | ICD-10-CM | POA: Insufficient documentation

## 2021-11-13 DIAGNOSIS — J45909 Unspecified asthma, uncomplicated: Secondary | ICD-10-CM | POA: Insufficient documentation

## 2021-11-13 DIAGNOSIS — M1990 Unspecified osteoarthritis, unspecified site: Secondary | ICD-10-CM | POA: Insufficient documentation

## 2021-11-13 DIAGNOSIS — G8222 Paraplegia, incomplete: Secondary | ICD-10-CM | POA: Diagnosis not present

## 2021-11-13 DIAGNOSIS — L89313 Pressure ulcer of right buttock, stage 3: Secondary | ICD-10-CM | POA: Diagnosis not present
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
## 2021-11-13 NOTE — Progress Notes (Signed)
PARTH, MCCORMAC ([id number]) Visit Report for 11/13/2021 Allergy List Details Patient Name: Justin [name], Justin [name] Date of Service: 11/13/2021 10:00 AM Medical Record Number: [id number] Patient Account Number: [id number] Date of Birth/Sex: Jan 09, 1981 (41 y.o. M) Treating RN: Donnamarie Poag Primary Care Deette Revak: Lelon Huh Other Clinician: Referring Bettyanne Dittman: Lelon Huh Treating Susann Lawhorne/Extender: Skipper Cliche in Treatment: 0 Allergies Active Allergies azithromycin morphine Ativan Demerol Allergy Notes Electronic Signature(s) Signed: 11/13/2021 4:22:58 PM By: Donnamarie Poag Entered By: Donnamarie Poag on 11/13/2021 10:16:38 JOE, GEE ([id number]) -------------------------------------------------------------------------------- Arrival Information Details Patient Name: Justin [name] Date of Service: 11/13/2021 10:00 AM Medical Record Number: [id number] Patient Account Number: [id number] Date of Birth/Sex: Jan 25, 1981 (41 y.o. M) Treating RN: Donnamarie Poag Primary Care Aswad Wandrey: Lelon Huh Other Clinician: Referring Janaiah Vetrano: Lelon Huh Treating Mathius Birkeland/Extender: Skipper Cliche in Treatment: 0 Visit Information Patient Arrived: Wheel Chair Arrival Time: 10:09 Accompanied By: wife Transfer Assistance: Manual Patient Identification Verified: Yes Secondary Verification Process Completed: Yes Patient Requires Transmission-Based Precautions: No Patient Has Alerts: Yes Patient Alerts: NOT diabetic History Since Last Visit Electronic Signature(s) Signed: 11/13/2021 4:22:58 PM By: Donnamarie Poag Entered By: Donnamarie Poag on 11/13/2021 10:11:57 Justin [name] ([id number]) -------------------------------------------------------------------------------- Clinic Level of Care Assessment Details Patient Name: Justin [name] Date of Service: 11/13/2021 10:00 AM Medical Record Number: [id number] Patient Account Number: [id number] Date of Birth/Sex: 1981/07/15 (41  y.o. M) Treating RN: Donnamarie Poag Primary Care Lempi Edwin: Lelon Huh Other Clinician: Referring Tiyana Galla: Lelon Huh Treating Jarrod Bodkins/Extender: Skipper Cliche in Treatment: 0 Clinic Level of Care Assessment Items TOOL 2 Quantity Score _0  - Use when only an EandM is performed on the INITIAL visit 0 ASSESSMENTS - Nursing Assessment / Reassessment X - General Physical Exam (combine w/ comprehensive assessment (listed just below) when performed on new 1 20 pt. evals) X- 1 25 Comprehensive Assessment (HX, ROS, Risk Assessments, Wounds Hx, etc.) ASSESSMENTS - Wound and Skin Assessment / Reassessment X - Simple Wound Assessment / Reassessment - one wound 1 5 _1  - 0 Complex Wound Assessment / Reassessment - multiple wounds X- 1 10 Dermatologic / Skin Assessment (not related to wound area) ASSESSMENTS - Ostomy and/or Continence Assessment and Care _2  - Incontinence Assessment and Management 0 _3  - 0 Ostomy Care Assessment and Management (repouching, etc.) PROCESS - Coordination of Care X - Simple Patient / Family Education for ongoing care 1 15 _4  - 0 Complex (extensive) Patient / Family Education for ongoing care _5  - 0 Staff obtains Programmer, systems, Records, Test Results / Process Orders _6  - 0 Staff telephones HHA, Nursing Homes / Clarify orders / etc _7  - 0 Routine Transfer to another Facility (non-emergent condition) _8  - 0 Routine Hospital Admission (non-emergent condition) X- 1 15 New Admissions / Biomedical engineer / Ordering NPWT, Apligraf, etc. _9  - 0 Emergency Hospital Admission (emergent condition) X- 1 10 Simple Discharge Coordination _10  - 0 Complex (extensive) Discharge Coordination PROCESS - Special Needs _11  - Pediatric / Minor Patient Management 0 _12  - 0 Isolation Patient Management _13  - 0 Hearing / Language / Visual special needs _14  - 0 Assessment of Community assistance (transportation, D/C planning, etc.) _15  - 0 Additional assistance / Altered  mentation _16  - 0 Support Surface(s) Assessment (bed, cushion, seat, etc.) INTERVENTIONS - Wound Cleansing / Measurement X - Wound Imaging (photographs - any number of wounds) 1 5 _17  - 0 Wound Tracing (instead of photographs) X- 1 5 Simple Wound Measurement - one wound _18  - 0 Complex Wound Measurement - multiple wounds Justin [name], Justin  N. ([id number]) X- 1 5 Simple Wound Cleansing - one wound _0  - 0 Complex Wound Cleansing - multiple wounds INTERVENTIONS - Wound Dressings X - Small Wound Dressing one or multiple wounds 1 10 _1  - 0 Medium Wound Dressing one or multiple wounds _2  - 0 Large Wound Dressing one or multiple wounds <[id number]>_2<\/DPOEUMPNTIRWERXV>_4  - 0 Application of Medications - injection INTERVENTIONS - Miscellaneous _4  - External ear exam 0 _5  - 0 Specimen Collection (cultures, biopsies, blood, body fluids, etc.) _6  - 0 Specimen(s) / Culture(s) sent or taken to Lab for analysis _7  - 0 Patient Transfer (multiple staff / Harrel Lemon Lift / Similar devices) _8  - 0 Simple Staple / Suture removal (25 or less) _9  - 0 Complex Staple / Suture removal (26 or more) _10  - 0 Hypo / Hyperglycemic Management (close monitor of Blood Glucose) _11  - 0 Ankle / Brachial Index (ABI) - do not check if billed separately Has the patient been seen at the hospital within the last three years: Yes Total Score: 125 Level Of Care: New/Established - Level 4 Electronic Signature(s) Signed: 11/13/2021 4:22:58 PM By: Donnamarie Poag Entered By: Donnamarie Poag on 11/13/2021 11:00:01 Justin [name] ([id number]) -------------------------------------------------------------------------------- Encounter Discharge Information Details Patient Name: Justin [name] Date of Service: 11/13/2021 10:00 AM Medical Record Number: [id number] Patient Account Number: [id number] Date of Birth/Sex: [birth year] (41 y.o. M) Treating RN: Donnamarie Poag Primary Care Ezekiah Massie: Lelon Huh Other Clinician: Referring Donna Silverman: Lelon Huh Treating Theona Muhs/Extender: Skipper Cliche in Treatment: 0 Encounter Discharge Information Items Post Procedure Vitals Discharge Condition: Stable Temperature (F): 97.8 Ambulatory Status: Wheelchair Pulse (bpm): 78 Discharge Destination: Home Respiratory Rate (breaths/min): 16 Transportation: Private Auto Blood Pressure (mmHg): 130/70 Accompanied By: wife Schedule Follow-up Appointment: Yes Clinical Summary of Care: Electronic Signature(s) Signed: 11/13/2021 4:22:58 PM By: Donnamarie Poag Entered By: Donnamarie Poag on 11/13/2021 11:07:42 Justin [name] ([id number]) -------------------------------------------------------------------------------- Lower Extremity Assessment Details Patient Name: Justin [name] Date of Service: 11/13/2021 10:00 AM Medical Record Number: [id number] Patient Account Number: [id number] Date of Birth/Sex: March 03, 1981 (41 y.o. M) Treating RN: Donnamarie Poag Primary Care Ermina Oberman: Lelon Huh Other Clinician: Referring Dannis Deroche: Lelon Huh Treating Aujanae Mccullum/Extender: Skipper Cliche in Treatment: 0 Electronic Signature(s) Signed: 11/13/2021 4:22:58 PM By: Donnamarie Poag Entered By: Donnamarie Poag on 11/13/2021 10:22:42 LOWERY, PAULLIN ([id number]) -------------------------------------------------------------------------------- Multi Wound Chart Details Patient Name: Justin [name] Date of Service: 11/13/2021 10:00 AM Medical Record Number: [id number] Patient Account Number: [id number] Date of Birth/Sex: 07-01-1981 (41 y.o. M) Treating RN: Donnamarie Poag Primary Care Kavish Lafitte: Lelon Huh Other Clinician: Referring Nole Robey: Lelon Huh Treating Koreena Joost/Extender: Skipper Cliche in Treatment: 0 Vital Signs Height(in): 65 Pulse(bpm): 30 Weight(lbs): Blood Pressure(mmHg): 105/71 Body Mass Index(BMI): Temperature(F): 97.7 Respiratory Rate(breaths/min): 16 Photos: [N/A:N/A] Wound Location: Right Gluteus N/A N/A Wounding  Event: Gradually Appeared N/A N/A Primary Etiology: Pressure Ulcer N/A N/A Comorbid History: Asthma, Pneumothorax, History of N/A N/A pressure wounds, Osteoarthritis, Paraplegia Date Acquired: 11/06/2021 N/A N/A Weeks of Treatment: 0 N/A N/A Wound Status: Open N/A N/A Wound Recurrence: No N/A N/A Clustered Wound: Yes N/A N/A Measurements L x W x D (cm) 3x1x0.1 N/A N/A Area (cm) : 2.356 N/A N/A Volume (cm) : 0.236 N/A N/A Classification: Category/Stage II N/A N/A Exudate Amount: Medium N/A N/A Exudate Type: Serosanguineous N/A N/A Exudate Color: red, brown N/A N/A Granulation Amount: Large (67-100%) N/A N/A Granulation Quality: Red, Pink N/A N/A Necrotic Amount: Small (1-33%) N/A N/A Exposed Structures: Fat Layer (Subcutaneous Tissue): N/A N/A Yes Fascia: No Tendon: No  Muscle: No Joint: No Bone: No Treatment Notes Electronic Signature(s) Signed: 11/13/2021 4:22:58 PM By: Donnamarie Poag Entered By: Donnamarie Poag on 11/13/2021 10:32:08 Justin [name], Justin [name] ([id number]) -------------------------------------------------------------------------------- [place] Details Patient Name: Justin [name] Date of Service: 11/13/2021 10:00 AM Medical Record Number: [id number] Patient Account Number: [id number] Date of Birth/Sex: 1981-08-17 (41 y.o. M) Treating RN: Donnamarie Poag Primary Care Camya Haydon: Lelon Huh Other Clinician: Referring Cyan Moultrie: Lelon Huh Treating Ralf Konopka/Extender: Skipper Cliche in Treatment: 0 Active Inactive Pressure Nursing Diagnoses: Knowledge deficit related to causes and risk factors for pressure ulcer development Knowledge deficit related to management of pressures ulcers Potential for impaired tissue integrity related to pressure, friction, moisture, and shear Goals: Patient will remain free from development of additional pressure ulcers Date Initiated: 11/13/2021 Target Resolution Date: 12/08/2021 Goal Status:  Active Patient/caregiver will verbalize risk factors for pressure ulcer development Date Initiated: 11/13/2021 Target Resolution Date: 11/24/2021 Goal Status: Active Interventions: Assess: immobility, friction, shearing, incontinence upon admission and as needed Assess offloading mechanisms upon admission and as needed Assess potential for pressure ulcer upon admission and as needed Notes: Electronic Signature(s) Signed: 11/13/2021 4:22:58 PM By: Donnamarie Poag Entered By: Donnamarie Poag on 11/13/2021 10:31:54 Justin [name] ([id number]) -------------------------------------------------------------------------------- Non-Wound Condition Assessment Details Patient Name: Justin [name] Date of Service: 11/13/2021 10:00 AM Medical Record Number: [id number] Patient Account Number: [id number] Date of Birth/Sex: 1981-02-16 (41 y.o. M) Treating RN: Donnamarie Poag Primary Care Calina Patrie: Lelon Huh Other Clinician: Referring Kitana Gage: Lelon Huh Treating Karynn Deblasi/Extender: Skipper Cliche in Treatment: 0 Non-Wound Condition: Condition: Other Dermatologic Condition Location: Hand Side: Left Photos Electronic Signature(s) Signed: 11/13/2021 4:22:58 PM By: Donnamarie Poag Entered By: Donnamarie Poag on 11/13/2021 10:24:12 Justin [name], Justin [name] ([id number]) -------------------------------------------------------------------------------- Pain Assessment Details Patient Name: Justin [name] Date of Service: 11/13/2021 10:00 AM Medical Record Number: [id number] Patient Account Number: [id number] Date of Birth/Sex: 1981/06/20 (41 y.o. M) Treating RN: Donnamarie Poag Primary Care Navarre Diana: Lelon Huh Other Clinician: Referring Safari Cinque: Lelon Huh Treating Salene Mohamud/Extender: Skipper Cliche in Treatment: 0 Active Problems Location of Pain Severity and Description of Pain Patient Has Paino Yes Site Locations Rate the pain. Current Pain Level: 3 Pain Management and Medication Current  Pain Management: Electronic Signature(s) Signed: 11/13/2021 4:22:58 PM By: Donnamarie Poag Entered By: Donnamarie Poag on 11/13/2021 10:14:30 Justin [name] ([id number]) -------------------------------------------------------------------------------- Patient/Caregiver Education Details Patient Name: Justin [name] Date of Service: 11/13/2021 10:00 AM Medical Record Number: [id number] Patient Account Number: [id number] Date of Birth/Gender: May 04, 1981 (41 y.o. M) Treating RN: Donnamarie Poag Primary Care Physician: Lelon Huh Other Clinician: Referring Physician: Lelon Huh Treating Physician/Extender: Skipper Cliche in Treatment: 0 Education Assessment Education Provided To: Patient and Caregiver Education Topics Provided Basic Hygiene: Nutrition: Offloading: Welcome To [facility]: Wound/Skin Impairment: Electronic Signature(s) Signed: 11/13/2021 4:22:58 PM By: Donnamarie Poag Entered By: Donnamarie Poag on 11/13/2021 11:00:26 Justin [name], Justin [name] ([id number]) -------------------------------------------------------------------------------- Wound Assessment Details Patient Name: Justin [name] Date of Service: 11/13/2021 10:00 AM Medical Record Number: [id number] Patient Account Number: [id number] Date of Birth/Sex: 11/21/1980 (41 y.o. M) Treating RN: Donnamarie Poag Primary Care Tinea Nobile: Lelon Huh Other Clinician: Referring Breslin Hemann: Lelon Huh Treating Davona Kinoshita/Extender: Skipper Cliche in Treatment: 0 Wound Status Wound Number: 3 Primary Pressure Ulcer Etiology: Wound Location: Right Gluteus Wound Status: Open Wounding Event: Gradually Appeared Comorbid Asthma, Pneumothorax, History of pressure wounds, Date Acquired: 11/06/2021 History: Osteoarthritis, Paraplegia Weeks Of Treatment: 0 Clustered Wound: Yes Photos Wound Measurements Length: (cm) 3 Width: (cm) 1 Depth: (cm) 0.1  Area: (cm) 2.356 Volume: (cm) 0.236 % Reduction in Area: 0% %  Reduction in Volume: 0% Tunneling: No Undermining: No Wound Description Classification: Category/Stage III Exudate Amount: Medium Exudate Type: Serosanguineous Exudate Color: red, brown Foul Odor After Cleansing: No Slough/Fibrino Yes Wound Bed Granulation Amount: Large (67-100%) Exposed Structure Granulation Quality: Red, Pink Fascia Exposed: No Necrotic Amount: Small (1-33%) Fat Layer (Subcutaneous Tissue) Exposed: Yes Necrotic Quality: Adherent Slough Tendon Exposed: No Muscle Exposed: No Joint Exposed: No Bone Exposed: No Treatment Notes Wound #3 (Gluteus) Wound Laterality: Right Cleanser [place] Ancillary Kit - 15 Day Supply Discharge Instruction: Use supplies as instructed; Kit contains: (15) Saline Bullets; (15) 3x3 Gauze; 15 pr Gloves Soap and Water Discharge Instruction: Gently cleanse wound with antibacterial soap, rinse and pat dry prior to dressing wounds Justin [name], VENNE. ([id number]) Peri-Wound Care Topical Primary Dressing Silvercel 4 1/4x 4 1/4 (in/in) Discharge Instruction: Apply Silvercel 4 1/4x 4 1/4 (in/in) as instructed Secondary Dressing BIOCLUSIVE Plus Transparent Film Dressing, 4x4.75 (in/in) Discharge Instruction: Cover with Tegaderm Secured With Compression Wrap Compression Stockings Add-Ons Electronic Signature(s) Signed: 11/13/2021 4:22:58 PM By: Donnamarie Poag Entered By: Donnamarie Poag on 11/13/2021 10:52:31 Justin [name] ([id number]) -------------------------------------------------------------------------------- [place] Details Patient Name: Justin [name] Date of Service: 11/13/2021 10:00 AM Medical Record Number: [id number] Patient Account Number: [id number] Date of Birth/Sex: 1980-12-18 (41 y.o. M) Treating RN: Donnamarie Poag Primary Care Rodolfo Gaster: Lelon Huh Other Clinician: Referring Dajsha Massaro: Lelon Huh Treating Thressa Shiffer/Extender: Skipper Cliche in Treatment: 0 Vital Signs Time Taken: 10:10 Temperature (F):  97.7 Height (in): 76 Pulse (bpm): 92 Source: Stated Respiratory Rate (breaths/min): 16 Unable to obtain height and weight: Medical Reason Blood Pressure (mmHg): 105/71 Reference Range: 80 - 120 mg / dl Electronic Signature(s) Signed: 11/13/2021 4:22:58 PM By: Donnamarie Poag Entered ByDonnamarie Poag on 11/13/2021 10:16:25

## 2021-11-14 DIAGNOSIS — S31819A Unspecified open wound of right buttock, initial encounter: Secondary | ICD-10-CM | POA: Diagnosis not present

## 2021-11-19 ENCOUNTER — Encounter: Payer: Self-pay | Admitting: Family Medicine

## 2021-11-20 ENCOUNTER — Encounter: Payer: Self-pay | Admitting: Gastroenterology

## 2021-11-20 ENCOUNTER — Other Ambulatory Visit: Payer: Self-pay

## 2021-11-20 ENCOUNTER — Ambulatory Visit (INDEPENDENT_AMBULATORY_CARE_PROVIDER_SITE_OTHER): Payer: Medicare HMO | Admitting: Gastroenterology

## 2021-11-20 ENCOUNTER — Ambulatory Visit: Payer: Medicare HMO | Admitting: Family Medicine

## 2021-11-20 ENCOUNTER — Telehealth: Payer: Self-pay | Admitting: Gastroenterology

## 2021-11-20 VITALS — BP 118/63 | HR 120 | Temp 97.8°F

## 2021-11-20 DIAGNOSIS — R110 Nausea: Secondary | ICD-10-CM

## 2021-11-20 DIAGNOSIS — R194 Change in bowel habit: Secondary | ICD-10-CM

## 2021-11-20 MED ORDER — NA SULFATE-K SULFATE-MG SULF 17.5-3.13-1.6 GM/177ML PO SOLN: 354.0000 mL | Freq: Once | ORAL | 0 refills | Status: AC

## 2021-11-20 MED ORDER — NA SULFATE-K SULFATE-MG SULF 17.5-3.13-1.6 GM/177ML PO SOLN: 354.0000 mL | Freq: Once | ORAL | 0 refills | Status: DC

## 2021-11-20 NOTE — Addendum Note (Signed)
Addended by: Adela Ports on: 11/20/2021 04:07 PM ? ? Modules accepted: Orders ? ?

## 2021-11-20 NOTE — Patient Instructions (Addendum)
High-Fiber Eating Plan °Fiber, also called dietary fiber, is a type of carbohydrate. It is found foods such as fruits, vegetables, whole grains, and beans. A high-fiber diet can have many health benefits. Your health care provider may recommend a high-fiber diet to help: °Prevent constipation. Fiber can make your bowel movements more regular. °Lower your cholesterol. °Relieve the following conditions: °Inflammation of veins in the anus (hemorrhoids). °Inflammation of specific areas of the digestive tract (uncomplicated diverticulosis). °A problem of the large intestine, also called the colon, that sometimes causes pain and diarrhea (irritable bowel syndrome, or IBS). °Prevent overeating as part of a weight-loss plan. °Prevent heart disease, type 2 diabetes, and certain cancers. °What are tips for following this plan? °Reading food labels ° °Check the nutrition facts label on food products for the amount of dietary fiber. Choose foods that have 5 grams of fiber or more per serving. °The goals for recommended daily fiber intake include: °Men (age 50 or younger): 34-38 g. °Men (over age 50): 28-34 g. °Women (age 50 or younger): 25-28 g. °Women (over age 50): 22-25 g. °Your daily fiber goal is _____________ g. °Shopping °Choose whole fruits and vegetables instead of processed forms, such as apple juice or applesauce. °Choose a wide variety of high-fiber foods such as avocados, lentils, oats, and kidney beans. °Read the nutrition facts label of the foods you choose. Be aware of foods with added fiber. These foods often have high sugar and sodium amounts per serving. °Cooking °Use whole-grain flour for baking and cooking. °Cook with brown rice instead of white rice. °Meal planning °Start the day with a breakfast that is high in fiber, such as a cereal that contains 5 g of fiber or more per serving. °Eat breads and cereals that are made with whole-grain flour instead of refined flour or white flour. °Eat brown rice, bulgur  wheat, or millet instead of white rice. °Use beans in place of meat in soups, salads, and pasta dishes. °Be sure that half of the grains you eat each day are whole grains. °General information °You can get the recommended daily intake of dietary fiber by: °Eating a variety of fruits, vegetables, grains, nuts, and beans. °Taking a fiber supplement if you are not able to take in enough fiber in your diet. It is better to get fiber through food than from a supplement. °Gradually increase how much fiber you consume. If you increase your intake of dietary fiber too quickly, you may have bloating, cramping, or gas. °Drink plenty of water to help you digest fiber. °Choose high-fiber snacks, such as berries, raw vegetables, nuts, and popcorn. °What foods should I eat? °Fruits °Berries. Pears. Apples. Oranges. Avocado. Prunes and raisins. Dried figs. °Vegetables °Sweet potatoes. Spinach. Kale. Artichokes. Cabbage. Broccoli. Cauliflower. Green peas. Carrots. Squash. °Grains °Whole-grain breads. Multigrain cereal. Oats and oatmeal. Brown rice. Barley. Bulgur wheat. Millet. Quinoa. Bran muffins. Popcorn. Rye wafer crackers. °Meats and other proteins °Navy beans, kidney beans, and pinto beans. Soybeans. Split peas. Lentils. Nuts and seeds. °Dairy °Fiber-fortified yogurt. °Beverages °Fiber-fortified soy milk. Fiber-fortified orange juice. °Other foods °Fiber bars. °The items listed above may not be a complete list of recommended foods and beverages. Contact a dietitian for more information. °What foods should I avoid? °Fruits °Fruit juice. Cooked, strained fruit. °Vegetables °Fried potatoes. Canned vegetables. Well-cooked vegetables. °Grains °White bread. Pasta made with refined flour. White rice. °Meats and other proteins °Fatty cuts of meat. Fried chicken or fried fish. °Dairy °Milk. Yogurt. Cream cheese. Sour cream. °Fats and   oils Butters. Beverages Soft drinks. Other foods Cakes and pastries. The items listed above may  not be a complete list of foods and beverages to avoid. Talk with your dietitian about what choices are best for you. Summary Fiber is a type of carbohydrate. It is found in foods such as fruits, vegetables, whole grains, and beans. A high-fiber diet has many benefits. It can help to prevent constipation, lower blood cholesterol, aid weight loss, and reduce your risk of heart disease, diabetes, and certain cancers. Increase your intake of fiber gradually. Increasing fiber too quickly may cause cramping, bloating, and gas. Drink plenty of water while you increase the amount of fiber you consume. The best sources of fiber include whole fruits and vegetables, whole grains, nuts, seeds, and beans. This information is not intended to replace advice given to you by your health care provider. Make sure you discuss any questions you have with your health care provider. Document Revised: 01/07/2020 Document Reviewed: 01/07/2020 Elsevier Patient Education  2022 [employer]. Gastroparesis Gastroparesis is a condition in which food takes longer than normal to empty from the stomach. This condition is also known as delayed gastric emptying. It is usually a long-term (chronic) condition. There is no cure, but there are treatments and things that you can do at home to help relieve symptoms. Treating the underlying condition that causes gastroparesis can also help relieve symptoms. What are the causes? In many cases, the cause of this condition is not known. Possible causes include: A hormone (endocrine) disorder, such as hypothyroidism or diabetes. A nervous system disease, such as Parkinson's disease or multiple sclerosis. Cancer, infection, or surgery that affects the stomach or vagus nerve. The vagus nerve runs from your chest, through your neck, and to the lower part of your brain. A connective tissue disorder, such as scleroderma. Certain medicines. What increases the risk? You are more likely to develop  this condition if: You have certain disorders or diseases. These may include: An endocrine disorder. An eating disorder. Amyloidosis. Scleroderma. Parkinson's disease. Multiple sclerosis. Cancer or infection of the stomach or the vagus nerve. You have had surgery on your stomach or vagus nerve. You take certain medicines. You are male. What are the signs or symptoms? Symptoms of this condition include: Feeling full after eating very little or a loss of appetite. Nausea, vomiting, or heartburn. Bloating of your abdomen. Inconsistent blood sugar (glucose) levels on blood tests. Unexplained weight loss. Acid from the stomach coming up into the esophagus (gastroesophageal reflux). Sudden tightening (spasm) of the stomach, which can be painful. Symptoms may come and go. Some people may not notice any symptoms. How is this diagnosed? This condition is diagnosed with tests, such as: Tests that check how long it takes food to move through the stomach and intestines. These tests include: Upper gastrointestinal (GI) series. For this test, you drink a liquid that shows up well on X-rays, and then X-rays are taken of your intestines. Gastric emptying scintigraphy. For this test, you eat food that contains a small amount of radioactive material, and then scans are taken. Wireless capsule GI monitoring system. For this test, you swallow a pill (capsule) that records information about how foods and fluid move through your stomach. Gastric manometry. For this test, a tube is passed down your throat and into your stomach to measure electrical and muscular activity. Endoscopy. For this test, a long, thin tube with a camera and light on the end is passed down your throat and into your stomach  to check for problems in your stomach lining. Ultrasound. This test uses sound waves to create images of the inside of your body. This can help rule out gallbladder disease or pancreatitis as a cause of your  symptoms. How is this treated? There is no cure for this condition, but treatment and home care may relieve symptoms. Treatment may include: Treating the underlying cause. Managing your symptoms by making changes to your diet and exercise habits. Taking medicines to control nausea and vomiting and to stimulate stomach muscles. Getting food through a feeding tube in the hospital. This may be done in severe cases. Having surgery to insert a device called a gastric electrical stimulator into your body. This device helps improve stomach emptying and control nausea and vomiting. Follow these instructions at home: Take over-the-counter and prescription medicines only as told by your health care provider. Follow instructions from your health care provider about eating or drinking restrictions. Your health care provider may recommend that you: Eat smaller meals more often. Eat low-fat foods. Eat low-fiber forms of high-fiber foods. For example, eat cooked vegetables instead of raw vegetables. Have only liquid foods instead of solid foods. Liquid foods are easier to digest. Drink enough fluid to keep your urine pale yellow. Exercise as often as told by your health care provider. Keep all follow-up visits. This is important. Contact a health care provider if you: Notice that your symptoms do not improve with treatment. Have new symptoms. Get help right away if you: Have severe pain in your abdomen that does not improve with treatment. Have nausea that is severe or does not go away. Vomit every time you drink fluids. Summary Gastroparesis is a long-term (chronic) condition in which food takes longer than normal to empty from the stomach. Symptoms include nausea, vomiting, heartburn, bloating of your abdomen, and loss of appetite. Eating smaller portions, low-fat foods, and low-fiber forms of high-fiber foods may help you manage your symptoms. Get help right away if you have severe pain in your  abdomen. This information is not intended to replace advice given to you by your health care provider. Make sure you discuss any questions you have with your health care provider. Document Revised: 01/11/2020 Document Reviewed: 01/11/2020 Elsevier Patient Education  2022 [employer].

## 2021-11-20 NOTE — Telephone Encounter (Signed)
[facility] had a question about colon prep for pt  ?
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
## 2021-11-21 ENCOUNTER — Telehealth (INDEPENDENT_AMBULATORY_CARE_PROVIDER_SITE_OTHER): Payer: Medicare HMO | Admitting: Family Medicine

## 2021-11-21 ENCOUNTER — Other Ambulatory Visit: Payer: Self-pay | Admitting: Physician Assistant

## 2021-11-21 DIAGNOSIS — N390 Urinary tract infection, site not specified: Secondary | ICD-10-CM

## 2021-11-21 LAB — POCT URINALYSIS DIPSTICK
Bilirubin, UA: NEGATIVE
Glucose, UA: NEGATIVE
Leukocytes, UA: NEGATIVE
Nitrite, UA: NEGATIVE
Protein, UA: POSITIVE — AB
Spec Grav, UA: <=1.005 — AB (ref 1.010–1.025)
Urobilinogen, UA: 0.2 E.U./dL
pH, UA: 8 (ref 5.0–8.0)

## 2021-11-21 NOTE — Telephone Encounter (Signed)
Patiens girlfriend came by office to drop off urine specimen, order was placed for analysis and for culture. Please see POCT results. KW ?

## 2021-11-21 NOTE — Telephone Encounter (Signed)
Pt states he has another UTI.  There is an order in Epic.  Pt is going to drop off a UA today. ? ? ?

## 2021-11-22 DIAGNOSIS — R339 Retention of urine, unspecified: Secondary | ICD-10-CM | POA: Diagnosis not present

## 2021-11-22 LAB — MICROSCOPIC EXAMINATION
Bacteria, UA: NONE SEEN
Casts: NONE SEEN /lpf
Epithelial Cells (non renal): NONE SEEN /hpf (ref 0–10)
RBC, Urine: >30 /hpf — AB (ref 0–2)
WBC, UA: >30 /hpf — AB (ref 0–5)

## 2021-11-22 LAB — URINALYSIS, ROUTINE W REFLEX MICROSCOPIC
Bilirubin, UA: NEGATIVE
Glucose, UA: NEGATIVE
Ketones, UA: NEGATIVE
Nitrite, UA: NEGATIVE
Specific Gravity, UA: 1.025 (ref 1.005–1.030)
Urobilinogen, Ur: 1 mg/dL (ref 0.2–1.0)
pH, UA: 7.5 (ref 5.0–7.5)

## 2021-11-23 ENCOUNTER — Encounter: Payer: Self-pay | Admitting: Gastroenterology

## 2021-11-23 LAB — URINE CULTURE: Organism ID, Bacteria: NO GROWTH

## 2021-11-27 ENCOUNTER — Other Ambulatory Visit: Payer: Self-pay

## 2021-11-27 ENCOUNTER — Encounter: Payer: Medicare HMO | Attending: Internal Medicine | Admitting: Internal Medicine

## 2021-11-27 DIAGNOSIS — L89313 Pressure ulcer of right buttock, stage 3: Secondary | ICD-10-CM | POA: Insufficient documentation

## 2021-11-27 DIAGNOSIS — J45909 Unspecified asthma, uncomplicated: Secondary | ICD-10-CM | POA: Diagnosis not present

## 2021-11-27 DIAGNOSIS — M1990 Unspecified osteoarthritis, unspecified site: Secondary | ICD-10-CM | POA: Insufficient documentation

## 2021-11-27 DIAGNOSIS — G8222 Paraplegia, incomplete: Secondary | ICD-10-CM | POA: Diagnosis not present
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
## 2021-11-27 NOTE — Progress Notes (Signed)
Justin [name], Justin [name] ([id number]) Visit Report for 11/27/2021 Arrival Information Details Patient Name: Justin [name], Justin [name] Date of Service: 11/27/2021 1:45 PM Medical Record Number: [id number] Patient Account Number: [id number] Date of Birth/Sex: 10-23-1980 (40 y.o. M) Treating RN: Donnamarie Poag Primary Care Keyla Milone: Lelon Huh Other Clinician: Referring Maleeya Peterkin: Lelon Huh Treating Shalise Rosado/Extender: Yaakov Guthrie in Treatment: 2 Visit Information History Since Last Visit Added or deleted any medications: No Patient Arrived: Wheel Chair Had a fall or experienced change in No Arrival Time: 14:04 activities of daily living that may affect Accompanied By: wife risk of falls: Transfer Assistance: Manual Hospitalized since last visit: No Patient Identification Verified: Yes Has Dressing in Place as Prescribed: Yes Secondary Verification Process Completed: Yes Pain Present Now: No Patient Requires Transmission-Based Precautions: No Patient Has Alerts: Yes Patient Alerts: NOT diabetic Electronic Signature(s) Signed: 11/27/2021 4:06:03 PM By: Donnamarie Poag Entered By: Donnamarie Poag on 11/27/2021 14:05:09 Justin [name] ([id number]) -------------------------------------------------------------------------------- Clinic Level of Care Assessment Details Patient Name: Justin [name] Date of Service: 11/27/2021 1:45 PM Medical Record Number: [id number] Patient Account Number: [id number] Date of Birth/Sex: August 19, 1981 (41 y.o. M) Treating RN: Donnamarie Poag Primary Care Breonna Gafford: Lelon Huh Other Clinician: Referring Boniface Goffe: Lelon Huh Treating Maelys Kinnick/Extender: Yaakov Guthrie in Treatment: 2 Clinic Level of Care Assessment Items TOOL 4 Quantity Score []  - Use when only an EandM is performed on FOLLOW-UP visit 0 ASSESSMENTS - Nursing Assessment / Reassessment []  - Reassessment of Co-morbidities (includes updates in patient status) 0 []  - 0 Reassessment  of Adherence to Treatment Plan ASSESSMENTS - Wound and Skin Assessment / Reassessment []  - Simple Wound Assessment / Reassessment - one wound 0 X- 2 5 Complex Wound Assessment / Reassessment - multiple wounds []  - 0 Dermatologic / Skin Assessment (not related to wound area) ASSESSMENTS - Focused Assessment []  - Circumferential Edema Measurements - multi extremities 0 []  - 0 Nutritional Assessment / Counseling / Intervention []  - 0 Lower Extremity Assessment (monofilament, tuning fork, pulses) []  - 0 Peripheral Arterial Disease Assessment (using hand held doppler) ASSESSMENTS - Ostomy and/or Continence Assessment and Care []  - Incontinence Assessment and Management 0 []  - 0 Ostomy Care Assessment and Management (repouching, etc.) PROCESS - Coordination of Care X - Simple Patient / Family Education for ongoing care 1 15 []  - 0 Complex (extensive) Patient / Family Education for ongoing care []  - 0 Staff obtains Programmer, systems, Records, Test Results / Process Orders []  - 0 Staff telephones HHA, Nursing Homes / Clarify orders / etc []  - 0 Routine Transfer to another Facility (non-emergent condition) []  - 0 Routine Hospital Admission (non-emergent condition) []  - 0 New Admissions / Biomedical engineer / Ordering NPWT, Apligraf, etc. []  - 0 Emergency Hospital Admission (emergent condition) X- 1 10 Simple Discharge Coordination []  - 0 Complex (extensive) Discharge Coordination PROCESS - Special Needs []  - Pediatric / Minor Patient Management 0 []  - 0 Isolation Patient Management []  - 0 Hearing / Language / Visual special needs []  - 0 Assessment of Community assistance (transportation, D/C planning, etc.) []  - 0 Additional assistance / Altered mentation []  - 0 Support Surface(s) Assessment (bed, cushion, seat, etc.) INTERVENTIONS - Wound Cleansing / Measurement Justin [name], ROHS. ([id number]) []  - 0 Simple Wound Cleansing - one wound X- 2 5 Complex Wound Cleansing -  multiple wounds X- 1 5 Wound Imaging (photographs - any number of wounds) []  - 0 Wound Tracing (instead of photographs) []  - 0 Simple Wound Measurement - one wound X- 2 5  Complex Wound Measurement - multiple wounds INTERVENTIONS - Wound Dressings X - Small Wound Dressing one or multiple wounds 2 10 []  - 0 Medium Wound Dressing one or multiple wounds []  - 0 Large Wound Dressing one or multiple wounds []  - 0 Application of Medications - topical []  - 0 Application of Medications - injection INTERVENTIONS - Miscellaneous []  - External ear exam 0 []  - 0 Specimen Collection (cultures, biopsies, blood, body fluids, etc.) []  - 0 Specimen(s) / Culture(s) sent or taken to Lab for analysis []  - 0 Patient Transfer (multiple staff / Civil Service fast streamer / Similar devices) []  - 0 Simple Staple / Suture removal (25 or less) []  - 0 Complex Staple / Suture removal (26 or more) []  - 0 Hypo / Hyperglycemic Management (close monitor of Blood Glucose) []  - 0 Ankle / Brachial Index (ABI) - do not check if billed separately X- 1 5 Vital Signs Has the patient been seen at the hospital within the last three years: Yes Total Score: 85 Level Of Care: New/Established - Level 3 Electronic Signature(s) Signed: 11/27/2021 4:06:03 PM By: Donnamarie Poag Entered By: Donnamarie Poag on 11/27/2021 14:36:31 Justin [name] ([id number]) -------------------------------------------------------------------------------- Encounter Discharge Information Details Patient Name: Justin [name] Date of Service: 11/27/2021 1:45 PM Medical Record Number: [id number] Patient Account Number: [id number] Date of Birth/Sex: 1981/03/16 (41 y.o. M) Treating RN: Donnamarie Poag Primary Care Randale Carvalho: Lelon Huh Other Clinician: Referring Braylin Xu: Lelon Huh Treating Martese Vanatta/Extender: Yaakov Guthrie in Treatment: 2 Encounter Discharge Information Items Discharge Condition: Stable Ambulatory Status:  Wheelchair Discharge Destination: Home Transportation: Private Auto Accompanied By: wife Schedule Follow-up Appointment: Yes Clinical Summary of Care: Electronic Signature(s) Signed: 11/27/2021 4:06:03 PM By: Donnamarie Poag Entered By: Donnamarie Poag on 11/27/2021 14:37:20 Justin [name] ([id number]) -------------------------------------------------------------------------------- Lower Extremity Assessment Details Patient Name: Justin [name] Date of Service: 11/27/2021 1:45 PM Medical Record Number: [id number] Patient Account Number: [id number] Date of Birth/Sex: Aug 05, 1981 (41 y.o. M) Treating RN: Donnamarie Poag Primary Care Hajra Port: Lelon Huh Other Clinician: Referring Tanganika Barradas: Lelon Huh Treating Laurance Heide/Extender: Yaakov Guthrie in Treatment: 2 Electronic Signature(s) Signed: 11/27/2021 4:06:03 PM By: Donnamarie Poag Entered By: Donnamarie Poag on 11/27/2021 14:12:11 Justin [name], Justin [name] ([id number]) -------------------------------------------------------------------------------- Multi Wound Chart Details Patient Name: Justin [name] Date of Service: 11/27/2021 1:45 PM Medical Record Number: [id number] Patient Account Number: [id number] Date of Birth/Sex: Oct 11, 1980 (41 y.o. M) Treating RN: Donnamarie Poag Primary Care Carlyn Mullenbach: Lelon Huh Other Clinician: Referring Sotirios Navarro: Lelon Huh Treating Doree Kuehne/Extender: Yaakov Guthrie in Treatment: 2 Vital Signs Height(in): 59 Pulse(bpm): 120 Weight(lbs): Blood Pressure(mmHg): 127/76 Body Mass Index(BMI): Temperature(F): 98.7 Respiratory Rate(breaths/min): 16 Photos: [N/A:N/A] Wound Location: Right Gluteus N/A N/A Wounding Event: Gradually Appeared N/A N/A Primary Etiology: Pressure Ulcer N/A N/A Comorbid History: Asthma, Pneumothorax, History of N/A N/A pressure wounds, Osteoarthritis, Paraplegia Date Acquired: 11/06/2021 N/A N/A Weeks of Treatment: 2 N/A N/A Wound Status: Open N/A N/A Wound  Recurrence: No N/A N/A Clustered Wound: Yes N/A N/A Measurements L x W x D (cm) 1.7x0.8x0.1 N/A N/A Area (cm) : 1.068 N/A N/A Volume (cm) : 0.107 N/A N/A % Reduction in Area: 54.70% N/A N/A % Reduction in Volume: 54.70% N/A N/A Classification: Category/Stage III N/A N/A Exudate Amount: Medium N/A N/A Exudate Type: Serosanguineous N/A N/A Exudate Color: red, brown N/A N/A Granulation Amount: Large (67-100%) N/A N/A Granulation Quality: Red, Pink N/A N/A Necrotic Amount: Small (1-33%) N/A N/A Exposed Structures: Fat Layer (Subcutaneous Tissue): N/A N/A Yes Fascia: No Tendon: No Muscle: No Joint: No  Bone: No Treatment Notes Electronic Signature(s) Signed: 11/27/2021 4:06:03 PM By: Donnamarie Poag Entered By: Donnamarie Poag on 11/27/2021 14:12:46 Justin [name], Justin [name] ([id number]) -------------------------------------------------------------------------------- [place] Details Patient Name: Justin [name] Date of Service: 11/27/2021 1:45 PM Medical Record Number: [id number] Patient Account Number: [id number] Date of Birth/Sex: Jun 28, 1981 (41 y.o. M) Treating RN: Donnamarie Poag Primary Care Madgeline Rayo: Lelon Huh Other Clinician: Referring Diala Waxman: Lelon Huh Treating Izella Ybanez/Extender: Yaakov Guthrie in Treatment: 2 Active Inactive Pressure Nursing Diagnoses: Knowledge deficit related to causes and risk factors for pressure ulcer development Knowledge deficit related to management of pressures ulcers Potential for impaired tissue integrity related to pressure, friction, moisture, and shear Goals: Patient will remain free from development of additional pressure ulcers Date Initiated: 11/13/2021 Target Resolution Date: 12/08/2021 Goal Status: Active Patient/caregiver will verbalize risk factors for pressure ulcer development Date Initiated: 11/13/2021 Target Resolution Date: 11/24/2021 Goal Status: Active Interventions: Assess: immobility,  friction, shearing, incontinence upon admission and as needed Assess offloading mechanisms upon admission and as needed Assess potential for pressure ulcer upon admission and as needed Notes: Electronic Signature(s) Signed: 11/27/2021 4:06:03 PM By: Donnamarie Poag Entered By: Donnamarie Poag on 11/27/2021 14:12:32 Justin [name] ([id number]) -------------------------------------------------------------------------------- Pain Assessment Details Patient Name: Justin [name] Date of Service: 11/27/2021 1:45 PM Medical Record Number: [id number] Patient Account Number: [id number] Date of Birth/Sex: 1981/09/05 (41 y.o. M) Treating RN: Donnamarie Poag Primary Care Daejah Klebba: Lelon Huh Other Clinician: Referring Traxton Kolenda: Lelon Huh Treating Emmalynne Courtney/Extender: Yaakov Guthrie in Treatment: 2 Active Problems Location of Pain Severity and Description of Pain Patient Has Paino No Site Locations Rate the pain. Current Pain Level: 0 Pain Management and Medication Current Pain Management: Electronic Signature(s) Signed: 11/27/2021 4:06:03 PM By: Donnamarie Poag Entered By: Donnamarie Poag on 11/27/2021 14:10:45 Justin [name] ([id number]) -------------------------------------------------------------------------------- Patient/Caregiver Education Details Patient Name: Justin [name] Date of Service: 11/27/2021 1:45 PM Medical Record Number: [id number] Patient Account Number: [id number] Date of Birth/Gender: 06-Jul-1981 (41 y.o. M) Treating RN: Donnamarie Poag Primary Care Physician: Lelon Huh Other Clinician: Referring Physician: Lelon Huh Treating Physician/Extender: Yaakov Guthrie in Treatment: 2 Education Assessment Education Provided To: Patient and Caregiver Education Topics Provided Basic Hygiene: Nutrition: Offloading: Pressure: Wound/Skin Impairment: Electronic Signature(s) Signed: 11/27/2021 4:06:03 PM By: Donnamarie Poag Entered By: Donnamarie Poag on  11/27/2021 14:13:18 Justin [name] ([id number]) -------------------------------------------------------------------------------- Wound Assessment Details Patient Name: Justin [name] Date of Service: 11/27/2021 1:45 PM Medical Record Number: [id number] Patient Account Number: [id number] Date of Birth/Sex: 07/26/1981 (41 y.o. M) Treating RN: Donnamarie Poag Primary Care Juelle Dickmann: Lelon Huh Other Clinician: Referring Adin Lariccia: Lelon Huh Treating Koen Antilla/Extender: Yaakov Guthrie in Treatment: 2 Wound Status Wound Number: 3 Primary Pressure Ulcer Etiology: Wound Location: Right Gluteus Wound Status: Open Wounding Event: Gradually Appeared Comorbid Asthma, Pneumothorax, History of pressure wounds, Date Acquired: 11/06/2021 History: Osteoarthritis, Paraplegia Weeks Of Treatment: 2 Clustered Wound: Yes Photos Wound Measurements Length: (cm) 1.7 Width: (cm) 0.8 Depth: (cm) 0.1 Area: (cm) 1.068 Volume: (cm) 0.107 % Reduction in Area: 54.7% % Reduction in Volume: 54.7% Tunneling: No Undermining: No Wound Description Classification: Category/Stage III Exudate Amount: Medium Exudate Type: Serosanguineous Exudate Color: red, brown Foul Odor After Cleansing: No Slough/Fibrino Yes Wound Bed Granulation Amount: Large (67-100%) Exposed Structure Granulation Quality: Red, Pink Fascia Exposed: No Necrotic Amount: Small (1-33%) Fat Layer (Subcutaneous Tissue) Exposed: Yes Necrotic Quality: Adherent Slough Tendon Exposed: No Muscle Exposed: No Joint Exposed: No Bone Exposed: No Treatment Notes Wound #3 (Gluteus) Wound Laterality: Right Cleanser Normal Saline  Discharge Instruction: Wash your hands with soap and water. Remove old dressing, discard into plastic bag and place into trash. Cleanse the wound with Normal Saline prior to applying a clean dressing using gauze sponges, not tissues or cotton balls. Do not scrub or use excessive force. Pat dry using  gauze sponges, not tissue or cotton balls. Soap and [address] Justin [name], Justin [name] ([id number]) Discharge Instruction: Gently cleanse wound with antibacterial soap, rinse and pat dry prior to dressing wounds Peri-Wound Care Topical Primary Dressing Silvercel Small 2x2 (in/in) Discharge Instruction: Apply Silvercel Small 2x2 (in/in) as instructed Secondary Dressing tegaderm Secured With Compression Wrap Compression Stockings Add-Ons Electronic Signature(s) Signed: 11/27/2021 4:06:03 PM By: Donnamarie Poag Entered By: Donnamarie Poag on 11/27/2021 14:11:25 Justin [name] ([id number]) -------------------------------------------------------------------------------- Wound Assessment Details Patient Name: Justin [name] Date of Service: 11/27/2021 1:45 PM Medical Record Number: [id number] Patient Account Number: [id number] Date of Birth/Sex: 1981-02-15 (41 y.o. M) Treating RN: Donnamarie Poag Primary Care Caleb Prigmore: Lelon Huh Other Clinician: Referring Siah Kannan: Lelon Huh Treating Tyrell Brereton/Extender: Yaakov Guthrie in Treatment: 2 Wound Status Wound Number: 4 Primary Pressure Ulcer Etiology: Wound Location: Peri-anal Wound Status: Open Wounding Event: Gradually Appeared Comorbid Asthma, Pneumothorax, History of pressure wounds, Date Acquired: 11/27/2021 History: Osteoarthritis, Paraplegia Weeks Of Treatment: 0 Clustered Wound: No Photos Wound Measurements Length: (cm) 0.8 Width: (cm) 0.2 Depth: (cm) 0.1 Area: (cm) 0.126 Volume: (cm) 0.013 % Reduction in Area: % Reduction in Volume: Tunneling: No Undermining: No Wound Description Classification: Category/Stage III Foul Odor After Cleansing: No Slough/Fibrino Yes Wound Bed Granulation Amount: Large (67-100%) Granulation Quality: Pink Necrotic Amount: Small (1-33%) Necrotic Quality: Adherent Slough Treatment Notes Wound #4 (Peri-anal) Cleanser Normal Saline Discharge Instruction: Wash your hands with soap  and water. Remove old dressing, discard into plastic bag and place into trash. Cleanse the wound with Normal Saline prior to applying a clean dressing using gauze sponges, not tissues or cotton balls. Do not scrub or use excessive force. Pat dry using gauze sponges, not tissue or cotton balls. Soap and Water Discharge Instruction: Gently cleanse wound with antibacterial soap, rinse and pat dry prior to dressing wounds Peri-Wound Care Justin [name], Justin [name] ([id number]) Topical Primary Dressing Prisma 4.34 (in) Discharge Instruction: Moisten w/normal saline or sterile water; Cover wound as directed. Do not remove from wound bed. Secondary Dressing tegaderm Secured With Compression Wrap Compression Stockings Add-Ons Electronic Signature(s) Signed: 11/27/2021 4:06:03 PM By: Donnamarie Poag Entered By: Donnamarie Poag on 11/27/2021 14:32:46 Justin [name], Justin [name] ([id number]) -------------------------------------------------------------------------------- [place] Details Patient Name: Justin [name] Date of Service: 11/27/2021 1:45 PM Medical Record Number: [id number] Patient Account Number: [id number] Date of Birth/Sex: [birth year] (41 y.o. M) Treating RN: Donnamarie Poag Primary Care Jerame Hedding: Lelon Huh Other Clinician: Referring Jalie Eiland: Lelon Huh Treating Makensie Mulhall/Extender: Yaakov Guthrie in Treatment: 2 Vital Signs Time Taken: 14:05 Temperature (F): 98.7 Height (in): 76 Pulse (bpm): 120 Respiratory Rate (breaths/min): 16 Blood Pressure (mmHg): 127/76 Reference Range: 80 - 120 mg / dl Electronic Signature(s) Signed: 11/27/2021 4:06:03 PM By: Donnamarie Poag Entered ByDonnamarie Poag on 11/27/2021 14:05:28

## 2021-11-29 ENCOUNTER — Other Ambulatory Visit: Payer: Self-pay | Admitting: Family Medicine

## 2021-11-29 DIAGNOSIS — G894 Chronic pain syndrome: Secondary | ICD-10-CM

## 2021-11-29 DIAGNOSIS — F988 Other specified behavioral and emotional disorders with onset usually occurring in childhood and adolescence: Secondary | ICD-10-CM

## 2021-11-29 MED ORDER — OXYCODONE HCL 15 MG PO TABS: 15.0000 mg | ORAL_TABLET | Freq: Four times a day (QID) | ORAL | 0 refills | Status: DC | PRN

## 2021-11-29 MED ORDER — METHADONE HCL 10 MG PO TABS: 20.0000 mg | ORAL_TABLET | Freq: Four times a day (QID) | ORAL | 0 refills | Status: DC

## 2021-11-29 MED ORDER — LEVOTHYROXINE SODIUM 88 MCG PO TABS: 88.0000 ug | ORAL_TABLET | Freq: Every day | ORAL | 12 refills | Status: DC

## 2021-11-29 MED ORDER — FESOTERODINE FUMARATE ER 8 MG PO TB24: 8.0000 mg | ORAL_TABLET | Freq: Every day | ORAL | 5 refills | Status: DC

## 2021-11-29 MED ORDER — AMPHETAMINE-DEXTROAMPHETAMINE 15 MG PO TABS: 1.0000 | ORAL_TABLET | Freq: Two times a day (BID) | ORAL | 0 refills | Status: DC

## 2021-12-01 ENCOUNTER — Telehealth: Payer: Self-pay | Admitting: Family Medicine

## 2021-12-01 ENCOUNTER — Telehealth: Payer: Self-pay

## 2021-12-01 NOTE — Telephone Encounter (Signed)
Copied from CRM [id number]. Topic: General - Other >> Dec 01, 2021  1:58 PM Wyonia Hough E wrote: Reason for CRM: Pt called and stated he was on his way to drop off a urine sample/ Okey Regal advised me to let pt know not to do so until he is called back / Pt thinks he may have a UTI / Lorain Childes

## 2021-12-01 NOTE — Telephone Encounter (Signed)
I tried completing PA via covermy meds. Received message that Prior authorization is not needed. Pharmacy advised. Pharmacy was able to override PA request.  ?

## 2021-12-01 NOTE — Telephone Encounter (Signed)
Rx fesoterodine (TOVIAZ) 8 MG TB24 tablet needs a PA / please advise  ?

## 2021-12-04 NOTE — Telephone Encounter (Signed)
Patient called requesting to drop off a urine sample. He wants his urine checked to see if he has a UTI. He reports symptoms of bladder spasms, or and cloudy urine. I advised patient that we would need to ask Dr. [name] if it was ok to drop off a urine sample. Patient states that Dr. [name] told him that he could drop off a urine sample anytime he felt that he had a UTI. Patient agreed to schedule an appointment for tomorrow with Debera Lat for UTI evaluation, but he is requesting that Dr. [name] put in future standing orders for him to drop off a urine sample whenever he believes he has a UTI. Please advise and clarify. ?

## 2021-12-05 ENCOUNTER — Encounter: Payer: Self-pay | Admitting: Physician Assistant

## 2021-12-05 ENCOUNTER — Ambulatory Visit (INDEPENDENT_AMBULATORY_CARE_PROVIDER_SITE_OTHER): Payer: Medicare HMO | Admitting: Physician Assistant

## 2021-12-05 ENCOUNTER — Other Ambulatory Visit: Payer: Self-pay

## 2021-12-05 VITALS — BP 100/84 | HR 148 | Temp 97.9°F

## 2021-12-05 DIAGNOSIS — R300 Dysuria: Secondary | ICD-10-CM

## 2021-12-05 DIAGNOSIS — G8220 Paraplegia, unspecified: Secondary | ICD-10-CM

## 2021-12-05 DIAGNOSIS — E039 Hypothyroidism, unspecified: Secondary | ICD-10-CM | POA: Diagnosis not present

## 2021-12-05 DIAGNOSIS — R7401 Elevation of levels of liver transaminase levels: Secondary | ICD-10-CM | POA: Diagnosis not present

## 2021-12-05 LAB — POCT URINALYSIS DIPSTICK
Bilirubin, UA: NEGATIVE
Glucose, UA: NEGATIVE
Ketones, UA: NEGATIVE
Nitrite, UA: NEGATIVE
Protein, UA: NEGATIVE
Spec Grav, UA: 1.01 (ref 1.010–1.025)
Urobilinogen, UA: 1 E.U./dL
pH, UA: 5 (ref 5.0–8.0)

## 2021-12-05 NOTE — Progress Notes (Signed)
?  ? ?I,Alexandrina Fiorini Robinson,acting as a Neurosurgeon for [employer], PA-C.,have documented all relevant documentation on the behalf of Debera Lat, PA-C,as directed by  [employer], PA-C while in the presence of [employer], PA-C. ? ? ?Established patient visit ? ? ?Patient: Justin [name]   DOB: March 02, 1981   41 y.o. Male  MRN: [id number] ?Visit Date: 12/05/2021 ? ?Today's healthcare provider: Debera Lat, PA-C  ? ?Chief Complaint  ?Patient presents with  ? Dysuria  ? ?Subjective  ?  ?HPI  ?Pt presents for bladder spasms and burning with urination. Patient has foley catheter.  ? ?He reports new onset cloudy malodorous urine, dysuria, urinary frequency, and urinary urgency, strong odor.The current episode started about a week ago and is staying constant. Patient states symptoms are moderate in intensity, occurring intermittently. He  has not been recently treated for similar symptoms other than cranberry supplement.  ?  ?Associated symptoms: ?No abdominal pain Yes back pain  ?No chills No constipation  ?Yes cramping No diarrhea  ?No discharge No fever  ?No hematuria No nausea  ?No vomiting   ? ?---------------------------------------------------------------------------------------  ? ?Medications: ?Outpatient Medications Prior to Visit  ?Medication Sig  ? albuterol (VENTOLIN HFA) 108 (90 Base) MCG/ACT inhaler Inhale 2 puffs into the lungs every 6 (six) hours as needed for wheezing or shortness of breath.  ? alprazolam (XANAX) 2 MG tablet TAKE ONE TABLET EVERY SIX HOURS IF NEEDED  ? amphetamine-dextroamphetamine (ADDERALL) 15 MG tablet Take 1 tablet by mouth 2 (two) times daily.  ? azelastine (OPTIVAR) 0.05 % ophthalmic solution Place 1 drop into both eyes 2 (two) times daily.  ? baclofen (LIORESAL) 20 MG tablet TAKE ONE TABLET FOUR TIMES DAILY  ? clotrimazole (LOTRIMIN) 1 % cream Apply 1 application topically 2 (two) times daily.  ? clotrimazole-betamethasone (LOTRISONE) cream Apply 1 application topically 2  (two) times daily.  ? diphenhydrAMINE (BENADRYL) 25 MG tablet Take 1 tablet by mouth as needed.  ? docusate sodium (COLACE) 250 MG capsule Take 1 capsule (250 mg total) by mouth 4 (four) times daily as needed.  ? dronabinol (MARINOL) 10 MG capsule Take 1 capsule (10 mg total) by mouth every 6 (six) hours as needed.  ? fesoterodine (TOVIAZ) 8 MG TB24 tablet Take 1 tablet (8 mg total) by mouth daily.  ? flavoxATE (URISPAS) 100 MG tablet Take 1 tablet (100 mg total) by mouth 3 (three) times daily as needed for bladder spasms.  ? fluticasone (FLONASE) 50 MCG/ACT nasal spray Place 2 sprays into both nostrils daily.  ? gabapentin (NEURONTIN) 800 MG tablet Take 1 tablet (800 mg total) by mouth 4 (four) times daily.  ? levothyroxine (SYNTHROID) 88 MCG tablet Take 1 tablet (88 mcg total) by mouth daily.  ? lidocaine (LMX) 4 % cream Apply 1 application topically 3 (three) times daily.  ? linaclotide (LINZESS) 145 MCG CAPS capsule Take 1 capsule (145 mcg total) by mouth daily before breakfast.  ? methadone (DOLOPHINE) 10 MG tablet Take 2 tablets (20 mg total) by mouth every 6 (six) hours. TAKE TWO TABLETS BY MOUTH EVERY 6 HOURS  ? Mineral Oil OIL daily.  ? naproxen (NAPROSYN) 500 MG tablet TAKE ONE TABLET TWICE DAILY  ? omeprazole (PRILOSEC) 20 MG capsule TAKE 1 CAPSULE BY MOUTH ONCE DAILY  ? oxyCODONE (ROXICODONE) 15 MG immediate release tablet Take 1 tablet (15 mg total) by mouth every 6 (six) hours as needed. for pain  ? PARoxetine (PAXIL) 30 MG tablet TAKE 2 TABLETS BY MOUTH DAILY  ?  phenazopyridine (PYRIDIUM) 200 MG tablet Take 1 tablet (200 mg total) by mouth 3 (three) times daily.  ? polyethylene glycol powder (QC NATURA-LAX) 17 GM/SCOOP powder MIX 1 CAPFUL IN 8 0Z OF WATER OR JUICE AND DRINK ONCE A DAY  ? promethazine (PHENERGAN) 25 MG suppository Place 1 suppository (25 mg total) rectally every 6 (six) hours as needed for nausea or vomiting.  ? promethazine (PHENERGAN) 25 MG tablet TAKE 1 TABLET BY MOUTH EVERY 4 HOURS  AS NEEDED FOR NAUSEA/VOMITING  ? sildenafil (REVATIO) 20 MG tablet Take 1 tablet by mouth as needed.  ? silver sulfADIAZINE (SSD) 1 % cream APPLY TO AFFECTED AREAS TOPICALLY TWICE DAILY  ? tiZANidine (ZANAFLEX) 4 MG tablet TAKE 2 TABLETS BY MOUTH 4 TIMES DAILY  ? ?Facility-Administered Medications Prior to Visit  ?Medication Dose Route Frequency Provider  ? methylPREDNISolone acetate (DEPO-MEDROL) injection 40 mg  40 mg Intramuscular Once Malva Limes, MD  ? ? ?Review of Systems  ?Constitutional:  Positive for activity change. Negative for fever.  ?Respiratory:  Negative for cough, chest tightness and shortness of breath.   ?Cardiovascular:  Negative for chest pain and leg swelling.  ?Gastrointestinal:  Negative for abdominal pain, nausea and vomiting.  ?Genitourinary:  Positive for dysuria, frequency and urgency.  ?All other systems reviewed and are negative. ? ? ?  Objective  ?  ?BP 100/84 (BP Location: Right Arm, Patient Position: Sitting, Cuff Size: Large)   Pulse (!) 148   Temp 97.9 ?F (36.6 ?C)   SpO2 97%  ? ? ?Physical Exam ?Vitals and nursing note reviewed.  ?Constitutional:   ?   General: He is in acute distress.  ?   Appearance: Normal appearance.  ?Cardiovascular:  ?   Rate and Rhythm: Normal rate and regular rhythm.  ?Pulmonary:  ?   Effort: Pulmonary effort is normal.  ?Abdominal:  ?   General: Abdomen is flat.  ?Neurological:  ?   Mental Status: He is alert.  ?Psychiatric:     ?   Behavior: Behavior normal.     ?   Thought Content: Thought content normal.     ?   Judgment: Judgment normal.  ?  ? ?No results found for any visits on 12/05/21. ? Assessment & Plan  ?  ? ?1. Dysuria ?Due to recurrent UTI: once every month, every other month ?- POCT Urinalysis Dipstick ?- Urine Culture ?- Urine Microscopic ?- Will send antibiotics after the results of urine culture will be back ? ?2. Paraplegia (HCC) ?Patient will contact Urology to schedule an appt. ?FU with Dr. [name] as scheduled. ? ?The patient  was advised to call back or seek an in-person evaluation if the symptoms worsen or if the condition fails to improve as anticipated. ? ?I discussed the assessment and treatment plan with the patient. The patient was provided an opportunity to ask questions and all were answered. The patient agreed with the plan and demonstrated an understanding of the instructions. ? ?The entirety of the information documented in the History of Present Illness, Review of Systems and Physical Exam were personally obtained by me. Portions of this information were initially documented by the CMA and reviewed by me for thoroughness and accuracy.   ? ? ?Debera Lat, PA-C  ?[facility] ?[phone] (phone) ?[phone] (fax) ? ?[facility] ?

## 2021-12-06 ENCOUNTER — Encounter: Payer: Self-pay | Admitting: Physician Assistant

## 2021-12-06 DIAGNOSIS — N390 Urinary tract infection, site not specified: Secondary | ICD-10-CM

## 2021-12-06 DIAGNOSIS — M25559 Pain in unspecified hip: Secondary | ICD-10-CM

## 2021-12-06 LAB — COMPREHENSIVE METABOLIC PANEL
ALT: 22 IU/L (ref 0–44)
AST: 20 IU/L (ref 0–40)
Albumin/Globulin Ratio: 2.4 — ABNORMAL HIGH (ref 1.2–2.2)
Albumin: 4.5 g/dL (ref 4.0–5.0)
Alkaline Phosphatase: 76 IU/L (ref 44–121)
BUN/Creatinine Ratio: 13 (ref 9–20)
BUN: 9 mg/dL (ref 6–24)
Bilirubin Total: 0.4 mg/dL (ref 0.0–1.2)
CO2: 24 mmol/L (ref 20–29)
Calcium: 9.6 mg/dL (ref 8.7–10.2)
Chloride: 103 mmol/L (ref 96–106)
Creatinine, Ser: 0.7 mg/dL — ABNORMAL LOW (ref 0.76–1.27)
Globulin, Total: 1.9 g/dL (ref 1.5–4.5)
Glucose: 94 mg/dL (ref 70–99)
Potassium: 4.4 mmol/L (ref 3.5–5.2)
Sodium: 139 mmol/L (ref 134–144)
Total Protein: 6.4 g/dL (ref 6.0–8.5)
eGFR: 119 mL/min/{1.73_m2} (ref >59)

## 2021-12-06 LAB — T4, FREE: Free T4: 1.99 ng/dL — ABNORMAL HIGH (ref 0.82–1.77)

## 2021-12-06 LAB — CBC
Hematocrit: 43.1 % (ref 37.5–51.0)
Hemoglobin: 14.7 g/dL (ref 13.0–17.7)
MCH: 27.3 pg (ref 26.6–33.0)
MCHC: 34.1 g/dL (ref 31.5–35.7)
MCV: 80 fL (ref 79–97)
Platelets: 193 10*3/uL (ref 150–450)
RBC: 5.39 x10E6/uL (ref 4.14–5.80)
RDW: 13.2 % (ref 11.6–15.4)
WBC: 7.2 10*3/uL (ref 3.4–10.8)

## 2021-12-06 LAB — TSH: TSH: 0.921 u[IU]/mL (ref 0.450–4.500)

## 2021-12-06 MED ORDER — NA SULFATE-K SULFATE-MG SULF 17.5-3.13-1.6 GM/177ML PO SOLN: 354.0000 mL | Freq: Once | ORAL | 0 refills | Status: AC

## 2021-12-06 NOTE — Addendum Note (Signed)
Addended by: Wayna Chalet on: 12/06/2021 05:14 PM ? ? Modules accepted: Orders ? ?

## 2021-12-07 LAB — URINALYSIS, MICROSCOPIC ONLY

## 2021-12-08 MED ORDER — NAPROXEN SODIUM 550 MG PO TABS: 550.0000 mg | ORAL_TABLET | Freq: Two times a day (BID) | ORAL | 5 refills | Status: DC

## 2021-12-08 MED ORDER — SULFAMETHOXAZOLE-TRIMETHOPRIM 800-160 MG PO TABS: 1.0000 | ORAL_TABLET | Freq: Two times a day (BID) | ORAL | 0 refills | Status: DC

## 2021-12-11 ENCOUNTER — Other Ambulatory Visit: Payer: Self-pay | Admitting: Family Medicine

## 2021-12-11 ENCOUNTER — Ambulatory Visit: Payer: Medicare HMO | Admitting: Physician Assistant

## 2021-12-12 LAB — URINE CULTURE

## 2021-12-15 ENCOUNTER — Other Ambulatory Visit: Payer: Self-pay | Admitting: Family Medicine

## 2021-12-19 ENCOUNTER — Telehealth: Payer: Self-pay | Admitting: Family Medicine

## 2021-12-19 ENCOUNTER — Other Ambulatory Visit: Payer: Self-pay | Admitting: Family Medicine

## 2021-12-19 ENCOUNTER — Other Ambulatory Visit: Payer: Self-pay | Admitting: Physician Assistant

## 2021-12-19 DIAGNOSIS — N390 Urinary tract infection, site not specified: Secondary | ICD-10-CM

## 2021-12-19 DIAGNOSIS — B952 Enterococcus as the cause of diseases classified elsewhere: Secondary | ICD-10-CM

## 2021-12-19 MED ORDER — AMOXICILLIN 875 MG PO TABS: 875.0000 mg | ORAL_TABLET | Freq: Two times a day (BID) | ORAL | 0 refills | Status: AC

## 2021-12-19 MED ORDER — AMOXICILLIN 875 MG PO TABS: 875.0000 mg | ORAL_TABLET | Freq: Two times a day (BID) | ORAL | 0 refills | Status: DC

## 2021-12-19 NOTE — Telephone Encounter (Signed)
Pt informed  ?He is also requesting refill of Natur-Lax powder. Okay to refill? ?

## 2021-12-19 NOTE — Telephone Encounter (Signed)
Pt called in about prescription for Uti to be sent over , but doesn't know the name of it/ Please call back to confirm. Pharm  ?[place], [place] Phone:  [phone]  ?Fax:  [phone]  ?  ? ?

## 2021-12-19 NOTE — Progress Notes (Signed)
Prescription changed/dose adjusted ?

## 2021-12-19 NOTE — Telephone Encounter (Signed)
Check with pharmacy and they did not relieve Rx Amoxicillin sent to pharmacy.  ?

## 2021-12-19 NOTE — Addendum Note (Signed)
Addended by: Ronnell Freshwater on: 12/19/2021 11:42 AM ? ? Modules accepted: Orders ? ?

## 2021-12-21 ENCOUNTER — Other Ambulatory Visit: Payer: Self-pay | Admitting: Family Medicine

## 2021-12-21 DIAGNOSIS — F988 Other specified behavioral and emotional disorders with onset usually occurring in childhood and adolescence: Secondary | ICD-10-CM

## 2021-12-21 DIAGNOSIS — G894 Chronic pain syndrome: Secondary | ICD-10-CM

## 2021-12-25 ENCOUNTER — Ambulatory Visit: Payer: Medicare HMO | Admitting: Physician Assistant

## 2021-12-25 DIAGNOSIS — R339 Retention of urine, unspecified: Secondary | ICD-10-CM | POA: Diagnosis not present

## 2021-12-25 MED ORDER — POLYETHYLENE GLYCOL 3350 17 GM/SCOOP PO POWD: ORAL | 3 refills | Status: DC

## 2021-12-25 NOTE — Addendum Note (Signed)
Addended by: Birdie Sons on: 12/25/2021 08:03 AM ? ? Modules accepted: Orders ? ?

## 2021-12-26 ENCOUNTER — Ambulatory Visit: Admit: 2021-12-26 | Payer: Medicare HMO | Admitting: Gastroenterology

## 2021-12-26 SURGERY — COLONOSCOPY WITH PROPOFOL
Anesthesia: General

## 2021-12-27 ENCOUNTER — Other Ambulatory Visit: Payer: Self-pay | Admitting: Family Medicine

## 2021-12-27 DIAGNOSIS — G894 Chronic pain syndrome: Secondary | ICD-10-CM

## 2021-12-27 DIAGNOSIS — F988 Other specified behavioral and emotional disorders with onset usually occurring in childhood and adolescence: Secondary | ICD-10-CM

## 2021-12-27 MED ORDER — SILDENAFIL CITRATE 20 MG PO TABS: 20.0000 mg | ORAL_TABLET | ORAL | 3 refills | Status: DC | PRN

## 2021-12-29 ENCOUNTER — Encounter: Payer: Self-pay | Admitting: Physician Assistant

## 2021-12-29 ENCOUNTER — Telehealth: Payer: Self-pay | Admitting: *Deleted

## 2021-12-29 NOTE — Telephone Encounter (Signed)
Please advise 

## 2021-12-29 NOTE — Telephone Encounter (Signed)
Copied from CRM [id number]. Topic: General - Other ?>> Dec 29, 2021 10:20 AM Traci Sermon wrote: ?Reason for CRM: Pt called in stating he sent a message yesterday that shows on "Patient Message" encounter on 12/06/21, pt requested if someone could take a look at the message and give him a call back, please advise. ?

## 2021-12-30 ENCOUNTER — Other Ambulatory Visit: Payer: Self-pay | Admitting: Family Medicine

## 2022-01-01 IMAGING — CT CT ANGIO CHEST
3 of 10 series · 14 of 46 positions shown · IV contrast (APPLIED)
Comparison: Comparison made with prior radiograph from earlier the
same day.

CLINICAL DATA: Initial evaluation for acute cough, shortness of
breath, diffuse abdominal pain.

EXAM:
CT ANGIOGRAPHY CHEST
CT ABDOMEN AND PELVIS WITH CONTRAST
TECHNIQUE: Multidetector CT imaging of the chest was performed using the
standard protocol during bolus administration of intravenous
contrast. Multiplanar CT image reconstructions and MIPs were
obtained to evaluate the vascular anatomy. Multidetector CT imaging
of the abdomen and pelvis was performed using the standard protocol
during bolus administration of intravenous contrast.
CONTRAST:  100mL OMNIPAQUE IOHEXOL 350 MG/ML SOLN

[Series 5: thins · axial · 0.79mm/px · z∈[-794,-560]mm · 8 of 285 slices shown]
[im 34/285  lung]
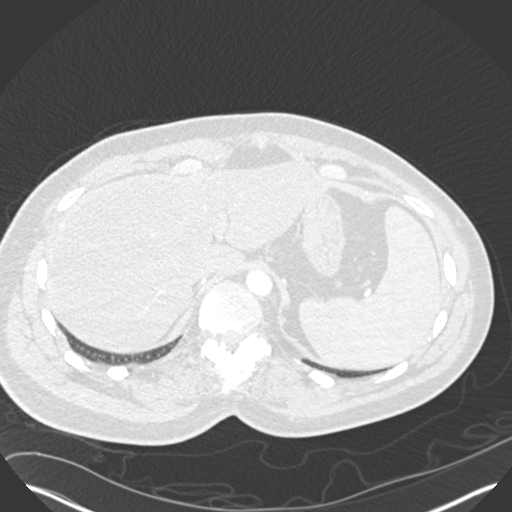
[im 67/285  lung]
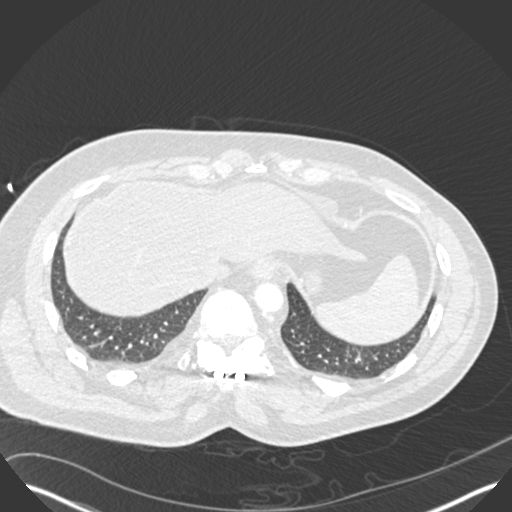
[im 101/285  lung]
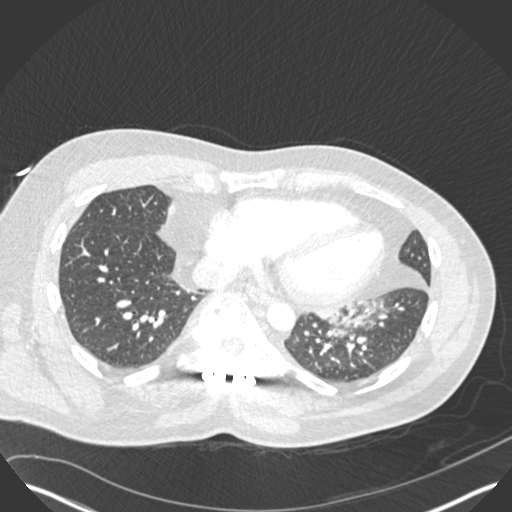
[im 134/285  lung]
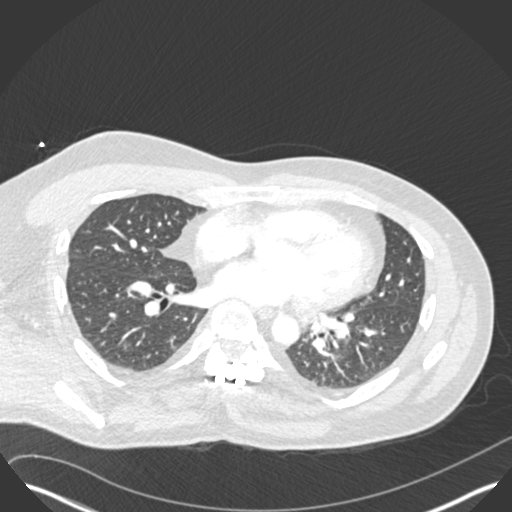
[im 168/285  lung]
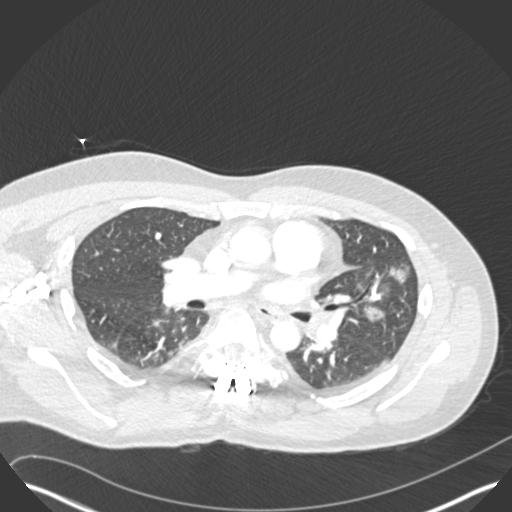
[im 201/285  lung]
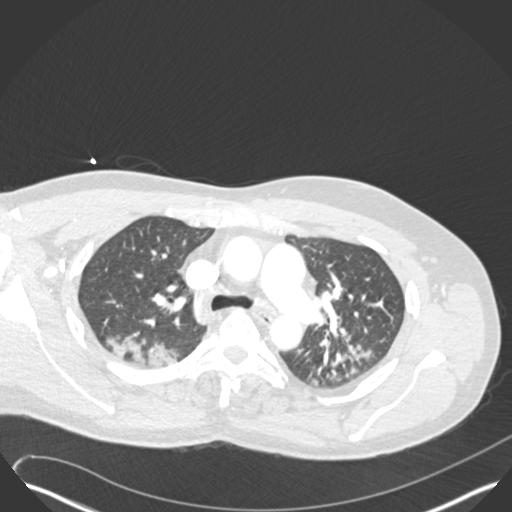
[im 234/285  lung]
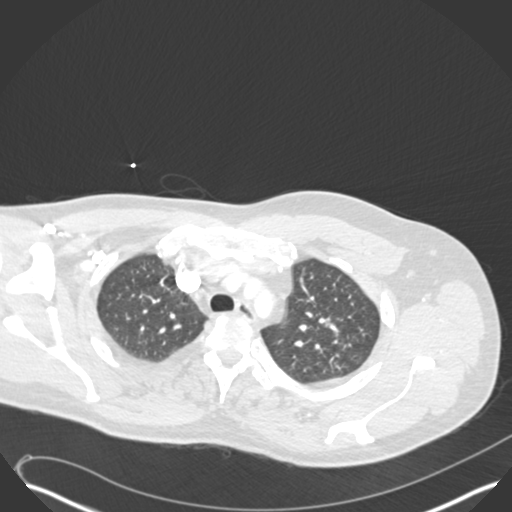
[im 268/285  lung]
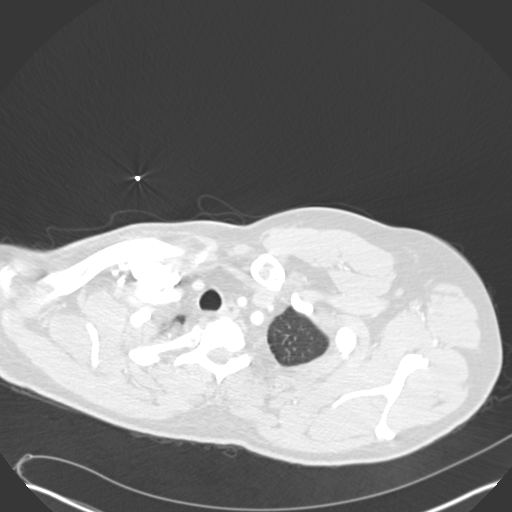

[Series 7: coronal mpr · coronal · 0.58mm/px · 2 of 121 slices shown]
[im 41/121  soft-tissue]
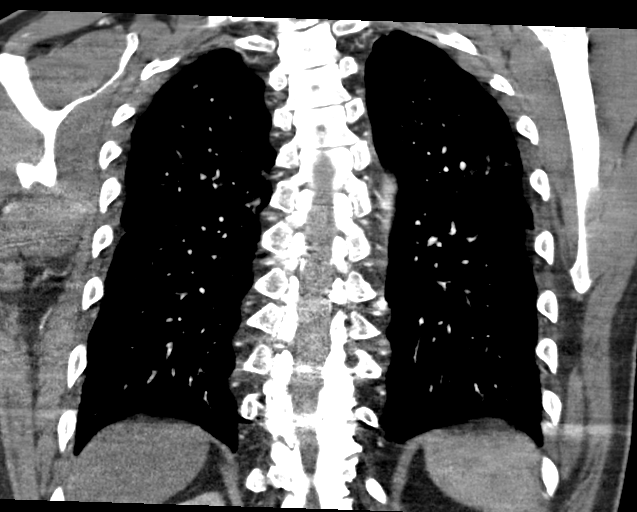
[im 81/121  soft-tissue]
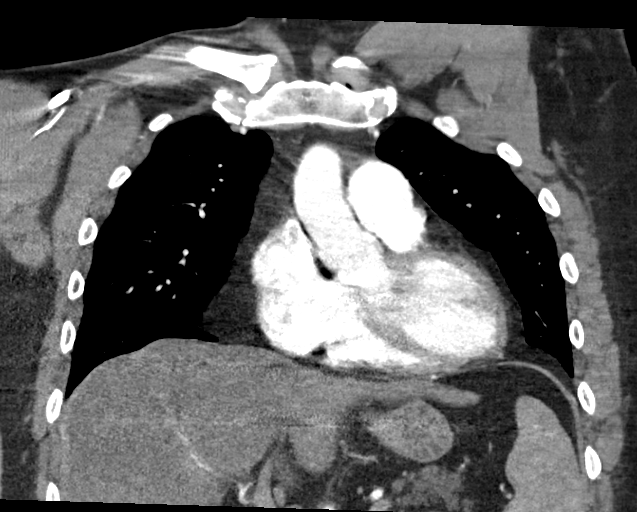

[Series 11: axial (person_name) (person_name) · axial · 0.81mm/px · z∈[-1098,-813]mm · 4 of 97 slices shown]
[im 20/97  lung]
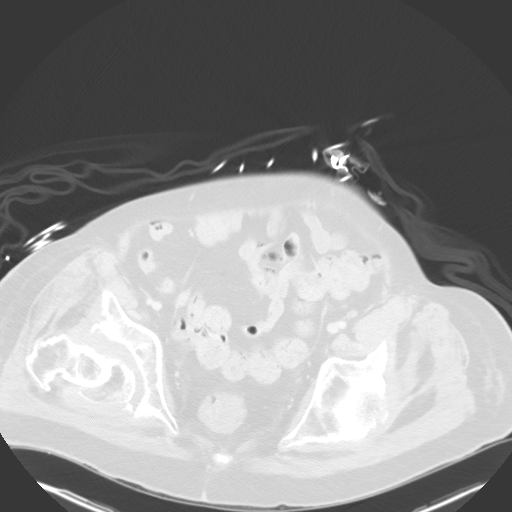
[im 39/97  soft-tissue]
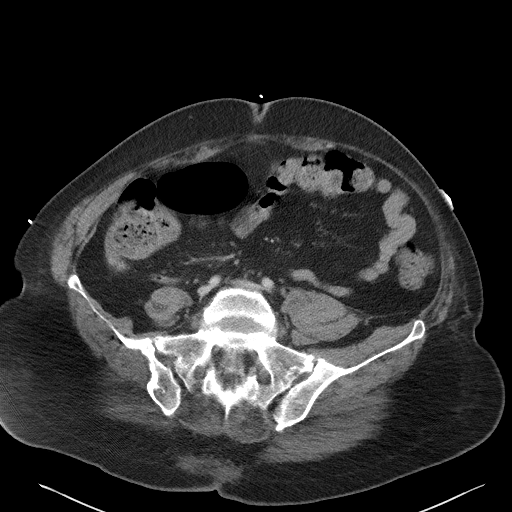
[im 58/97  lung]
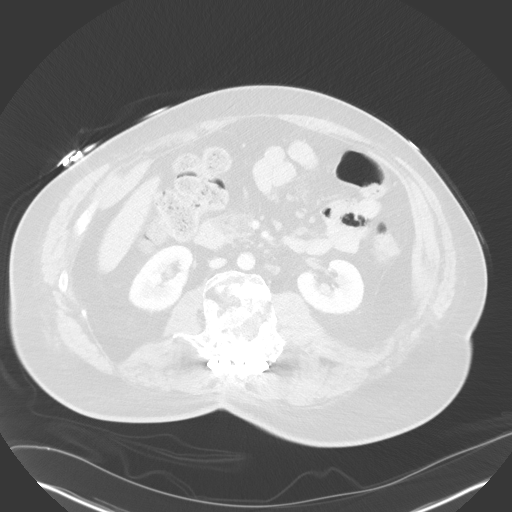
[im 77/97  soft-tissue]
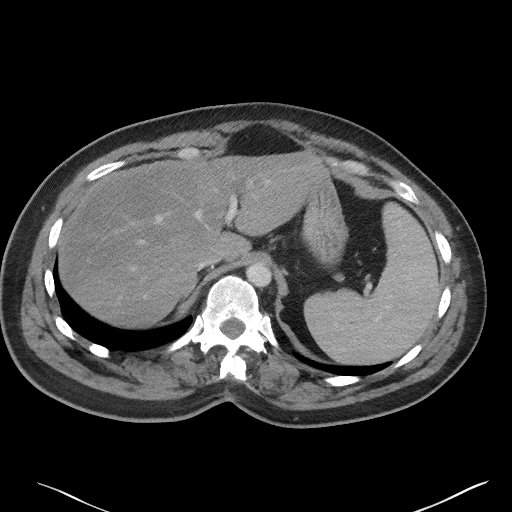

[14 of 46 positions shown; findings below may reference images not displayed]

FINDINGS: CTA CHEST FINDINGS

Cardiovascular: Normal intravascular enhancement seen throughout the
intrathoracic aorta without aneurysm or other acute abnormality.
Visualized great vessels within normal limits. Heart size normal. No
pericardial effusion.

Pulmonary arterial tree adequately opacified for evaluation. Main
pulmonary artery dilated up to 3.8 cm, which can be seen the setting
of underlying pulmonary hypertension. No filling defect to suggest
acute pulmonary embolism identified. Re-formatted imaging confirms
these findings.

Mediastinum/Nodes: No pathologically enlarged mediastinal lymph
nodes. Enlarged right hilar node measures up to 14 mm. Left hilar
lymph nodes measure up to 9 mm. No axillary adenopathy. No
mediastinal mass. Esophagus within normal limits.

Lungs/Pleura: Tracheobronchial tree intact and patent. Lungs
hypoinflated. Multifocal patchy opacity seen involving the bilateral
upper and left lower lobes, concerning for multifocal pneumonia.
Superimposed volume loss with bronchiectatic changes present at the
medial left lung base. No edema or pleural effusion. No
pneumothorax. No definite worrisome pulmonary nodule or mass.

Musculoskeletal: External soft tissues demonstrate no acute finding.
No acute osseous abnormality. No discrete lytic or blastic osseous
lesions. Thoracolumbar scoliosis with sequelae of prior fusion
noted.

Review of the MIP images confirms the above findings.

CT ABDOMEN and PELVIS FINDINGS

Hepatobiliary: Diffuse hypoattenuation liver consistent with
steatosis. Subtle 13 mm hypodense lesion seen at the medial aspect
of the liver (series 11, image 29), of indeterminate. Gallbladder
within normal limits. No biliary dilatation.

Pancreas: Diffuse fatty infiltration of the pancreas noted. Pancreas
otherwise unremarkable.

Spleen: Spleen within normal limits.

Adrenals/Urinary Tract: Adrenal glands are normal. Kidneys equal
size with symmetric enhancement. No nephrolithiasis, hydronephrosis,
or focal enhancing renal mass. No hydroureter. Partially distended
bladder demonstrates no acute abnormality. Fat containing bilateral
inguinal hernias noted, with a portion of the urinary bladder
extending into the right inguinal hernia (series 11, image 87). No
associated inflammation.

Stomach/Bowel: Stomach decompressed without acute abnormality. No
evidence for bowel obstruction. Normal appendix. Large volume
retained stool seen within the colon, suggesting constipation. Short
segment loop of small bowel extends into the right inguinal hernia
without associated obstruction or inflammation (series 11, image
87). No acute inflammatory changes seen about the bowels.

Vascular/Lymphatic: Normal intravascular enhancement seen throughout
the intra-abdominal aorta. Mesenteric vessels patent proximally. No
pathologically enlarged intra-abdominal or pelvic lymph nodes.

Reproductive: Prostate within normal limits.

Other: No free air or fluid. Mild stranding seen along the lateral
margin of the left psoas muscle, favored to be postoperative in
nature (series 11, image 58). Small bilateral inguinal hernias again
noted, right larger than left. Additional small fat containing
paraumbilical hernia noted as well.

Musculoskeletal: No acute osseous abnormality. No discrete lytic or
blastic osseous lesions. Extensive postsurgical changes present
within the thoracolumbar spine. Bilateral hip dysplasia with
associated osteoarthritic changes, right worse than left.

Hazy soft tissue stranding seen within the subcutaneous fat
posterior to the ischial tuberosities bilaterally, right greater
than left (series 11, image 97). No frank soft tissue ulceration or
collection.

Review of the MIP images confirms the above findings.
IMPRESSION: CTA CHEST IMPRESSION

1. No CT evidence for acute pulmonary embolism.
2. Multifocal patchy airspace opacities involving the bilateral
lungs, concerning for multifocal pneumonia. Changes most pronounced
within the upper lobes bilaterally.
3. Dilatation of the main pulmonary artery up to 3.8 cm, suggesting
underlying pulmonary hypertension.

CT ABDOMEN AND PELVIS IMPRESSION

1. No acute abnormality within the abdomen and pelvis.
2. Large volume retained stool within the colon, suggesting
constipation.
3. Right inguinal hernia containing a short segment loop of bowel
and a portion of the urinary bladder without associated inflammation
or obstruction.
4. Soft tissue stranding involving the subcutaneous fat posterior to
both ischial tuberosities within the bilateral gluteal regions,
right greater than left. Correlation with physical exam for possible
acute infection/cellulitis recommended. No frank soft tissue
ulceration or collection.
5. Hepatic steatosis.
6. 13 mm hypodense lesion at the medial aspect of the liver,
indeterminate. Follow-up examination with dedicated MRI of the
abdomen suggested for further characterization. This could be
performed on a nonemergent outpatient basis.

## 2022-01-01 NOTE — Telephone Encounter (Signed)
Requested medication (s) are due for refill today: yes ? ?Requested medication (s) are on the active medication list: no ? ?Last refill:  11/27/21 ? ?Future visit scheduled: no ? ?Notes to clinic:  rx isn't on pt's med list. Please assess ? ? ?  ?Requested Prescriptions  ?Pending Prescriptions Disp Refills  ? oxybutynin (DITROPAN) 5 MG tablet [Pharmacy Med Name: OXYBUTYNIN CHLORIDE 5 MG TAB] 30 tablet   ?  Sig: TAKE ONE TABLET DAILY AS NEEDED FOR BLADDER SPASMS  ?  ? Urology:  Bladder Agents Passed - 12/30/2021  9:47 AM  ?  ?  Passed - Valid encounter within last 12 months  ?  Recent Outpatient Visits   ? ?      ? 3 weeks ago Dysuria  ? [facility] [place], [place], [place]  ? 2 months ago Pressure injury of right buttock, stage 1  ? [facility] Malva Limes, MD  ? 2 months ago Pressure injury of right buttock, stage 1  ? [facility] Malva Limes, MD  ? 3 months ago Chronic constipation  ? [facility] Malva Limes, MD  ? 3 months ago Dysuria  ? [facility] [name], Demetrios Isaacs, MD  ? ?  ?  ? ? ?  ?  ?  ? ?

## 2022-01-05 ENCOUNTER — Telehealth: Payer: Self-pay | Admitting: Family Medicine

## 2022-01-05 NOTE — Telephone Encounter (Signed)
Copied from CRM [id number]. Topic: Medicare AWV ?>> Jan 05, 2022 10:13 AM Claudette Laws R wrote: ?Reason for CRM:  ?Tried contacting patient to schedule Medicare Annual Wellness Visit (AWV) in office.  Did not have a good signal. Tried calling back but would not go through ? ?If not able to come in the office, please offer to do virtually or by telephone.  ? ?No hx of AWV - AWV-I eligible per [employer] as of 10/18/2012 ? ?Please schedule at anytime with [facility] Health Advisor.  ? ?45 minute appointment ? ?Any questions, please contact me at [phone] ?

## 2022-01-08 ENCOUNTER — Ambulatory Visit: Payer: Self-pay

## 2022-01-08 NOTE — Telephone Encounter (Signed)
?  Chief Complaint: UTI ?Symptoms: urinary urgency, urine odor, dysuria ?Frequency: 2-3 days ?Pertinent Negatives: NA ?Disposition: [] ED /[] Urgent Care (no appt availability in office) / [x] Appointment(In office/virtual)/ []  [facility]/ [] Home Care/ [] Refused Recommended Disposition /[] [facility]/ []  Follow-up with PCP ?Additional Notes: pt has had to place foley in d/t using all the intermittent caths. Scheduled VV and pt states he can possibly bring urine sample in if needed. Advised pt I will send message since appt scheduled for 01/09/22 at 1620.  ? ?Summary: urinary discomfort  ? The patient has experienced urinary discomfort since Saturday 01/06/22  ? ?The patient shares that they've experienced a burning sensation when urinating and noticed an odor to their urine  ? ?Please contact further when possible   ?  ? ?Reason for Disposition ? Bad or foul-smelling urine ? ?Answer Assessment - Initial Assessment Questions ?1. SYMPTOM: "What's the main symptom you're concerned about?" (e.g., frequency, incontinence) ?    dysuria ?2. ONSET: "When did the  sx  start?" ?    Saturday ?3. PAIN: "Is there any pain?" If Yes, ask: "How bad is it?" (Scale: 1-10; mild, moderate, severe) ?    4/5 ?4. CAUSE: "What do you think is causing the symptoms?" ?    Bladder infection ?5. OTHER SYMPTOMS: "Do you have any other symptoms?" (e.g., fever, flank pain, blood in urine, pain with urination) ?    Odor with urine ? ?Protocols used: Urinary Symptoms-A-AH ? ?

## 2022-01-08 NOTE — Telephone Encounter (Signed)
Patient has a virtual visit scheduled for 01/09/2022 for UTI symptoms. Patient plans to have his wife drop off a urine sample tomorrow morning so that results are available during the virtual office visit.  ?

## 2022-01-09 ENCOUNTER — Telehealth (INDEPENDENT_AMBULATORY_CARE_PROVIDER_SITE_OTHER): Payer: Medicare HMO | Admitting: Family Medicine

## 2022-01-09 ENCOUNTER — Encounter: Payer: Self-pay | Admitting: Family Medicine

## 2022-01-09 DIAGNOSIS — R300 Dysuria: Secondary | ICD-10-CM

## 2022-01-09 LAB — POCT URINALYSIS DIPSTICK
Bilirubin, UA: NEGATIVE
Blood, UA: NEGATIVE
Glucose, UA: NEGATIVE
Ketones, UA: NEGATIVE
Nitrite, UA: POSITIVE
Protein, UA: NEGATIVE
Spec Grav, UA: 1.015 (ref 1.010–1.025)
Urobilinogen, UA: 0.2 E.U./dL
pH, UA: 7 (ref 5.0–8.0)

## 2022-01-09 NOTE — Progress Notes (Signed)
MyChart Video Visit    Virtual Visit via Video Note   This visit type was conducted due to national recommendations for restrictions regarding the COVID-19 Pandemic (e.g. social distancing) in an effort to limit this patient's exposure and mitigate transmission in our community. This patient is at least at moderate risk for complications without adequate follow up. This format is felt to be most appropriate for this patient at this time. Physical exam was limited by quality of the video and audio technology used for the visit.   Patient location: Home Provider location: [facility]   I discussed the limitations of evaluation and management by telemedicine and the availability of in person appointments. The patient expressed understanding and agreed to proceed.  Patient: Justin [name]   DOB: [birth year]   41 y.o. Male  MRN: [id number] Visit Date: 01/09/2022  Today's healthcare provider: Mila Merry, MD   No chief complaint on file.  Subjective    HPI  Patient has been experiencing urinary discomfort since 01/06/22. Patient states there is a burning sensation when urinating and notices an odor to the urine. Patient states he thinks his symptoms are related to a bladder infection. Patients wife dropped off his urine sample this morning, 01/09/22.   Medications: Outpatient Medications Prior to Visit  Medication Sig   albuterol (VENTOLIN HFA) 108 (90 Base) MCG/ACT inhaler Inhale 2 puffs into the lungs every 6 (six) hours as needed for wheezing or shortness of breath.   alprazolam (XANAX) 2 MG tablet TAKE ONE TABLET EVERY SIX HOURS IF NEEDED   amphetamine-dextroamphetamine (ADDERALL) 15 MG tablet TAKE ONE TABLET BY MOUTH TWICE DAILY   azelastine (OPTIVAR) 0.05 % ophthalmic solution Place 1 drop into both eyes 2 (two) times daily.   baclofen (LIORESAL) 20 MG tablet TAKE ONE TABLET FOUR TIMES DAILY   clotrimazole (LOTRIMIN) 1 % cream Apply 1 application  topically 2 (two) times daily.   clotrimazole-betamethasone (LOTRISONE) cream Apply 1 application topically 2 (two) times daily.   diphenhydrAMINE (BENADRYL) 25 MG tablet Take 1 tablet by mouth as needed.   docusate sodium (COLACE) 250 MG capsule Take 1 capsule (250 mg total) by mouth 4 (four) times daily as needed.   dronabinol (MARINOL) 10 MG capsule Take 1 capsule (10 mg total) by mouth every 6 (six) hours as needed.   fesoterodine (TOVIAZ) 8 MG TB24 tablet Take 1 tablet (8 mg total) by mouth daily.   flavoxATE (URISPAS) 100 MG tablet Take 1 tablet (100 mg total) by mouth 3 (three) times daily as needed for bladder spasms.   fluticasone (FLONASE) 50 MCG/ACT nasal spray Place 2 sprays into both nostrils daily.   gabapentin (NEURONTIN) 800 MG tablet Take 1 tablet (800 mg total) by mouth 4 (four) times daily.   levothyroxine (SYNTHROID) 88 MCG tablet Take 1 tablet (88 mcg total) by mouth daily.   lidocaine (LMX) 4 % cream Apply 1 application topically 3 (three) times daily.   linaclotide (LINZESS) 145 MCG CAPS capsule Take 1 capsule (145 mcg total) by mouth daily before breakfast.   methadone (DOLOPHINE) 10 MG tablet TAKE TWO TABLETS BY MOUTH EVERY 6 HOURS   Mineral Oil OIL daily.   naproxen sodium (ANAPROX DS) 550 MG tablet Take 1 tablet (550 mg total) by mouth 2 (two) times daily with a meal.   omeprazole (PRILOSEC) 20 MG capsule TAKE 1 CAPSULE BY MOUTH ONCE DAILY   oxyCODONE (ROXICODONE) 15 MG immediate release tablet TAKE ONE TABLET BY MOUTH EVERY 6  HOURS AS NEEDED FOR PAIN   PARoxetine (PAXIL) 30 MG tablet TAKE 2 TABLETS BY MOUTH DAILY   phenazopyridine (PYRIDIUM) 200 MG tablet Take 1 tablet (200 mg total) by mouth 3 (three) times daily.   polyethylene glycol powder (QC NATURA-LAX) 17 GM/SCOOP powder MIX 1 CAPFUL IN 8 0Z OF WATER OR JUICE AND DRINK ONCE A DAY   promethazine (PHENERGAN) 25 MG suppository Place 1 suppository (25 mg total) rectally every 6 (six) hours as needed for nausea or  vomiting.   promethazine (PHENERGAN) 25 MG tablet TAKE 1 TABLET BY MOUTH EVERY 4 HOURS AS NEEDED FOR NAUSEA/VOMITING   sildenafil (REVATIO) 20 MG tablet Take 1 tablet (20 mg total) by mouth as needed.   silver sulfADIAZINE (SSD) 1 % cream APPLY TO AFFECTED AREAS TOPICALLY TWICE DAILY   sulfamethoxazole-trimethoprim (BACTRIM DS) 800-160 MG tablet Take 1 tablet by mouth 2 (two) times daily.   tiZANidine (ZANAFLEX) 4 MG tablet TAKE 2 TABLETS BY MOUTH 4 TIMES DAILY   Facility-Administered Medications Prior to Visit  Medication Dose Route Frequency Provider   methylPREDNISolone acetate (DEPO-MEDROL) injection 40 mg  40 mg Intramuscular Once Malva Limes, MD    Review of Systems     Objective    There were no vitals taken for this visit.     Physical Exam  Awake, alert, oriented x 3. In no apparent distress    Assessment & Plan    1. Dysuria  - Urine Culture   No follow-ups on file.     I discussed the assessment and treatment plan with the patient. The patient was provided an opportunity to ask questions and all were answered. The patient agreed with the plan and demonstrated an understanding of the instructions.   The patient was advised to call back or seek an in-person evaluation if the symptoms worsen or if the condition fails to improve as anticipated.  I provided 10 minutes of non-face-to-face time during this encounter.  The entirety of the information documented in the History of Present Illness, Review of Systems and Physical Exam were personally obtained by me. Portions of this information were initially documented by the CMA and reviewed by me for thoroughness and accuracy.    Mila Merry, MD [facility] [phone] (phone) [phone] (fax)  [facility]

## 2022-01-10 ENCOUNTER — Ambulatory Visit (INDEPENDENT_AMBULATORY_CARE_PROVIDER_SITE_OTHER): Payer: Medicare HMO

## 2022-01-10 VITALS — Wt 160.0 lb

## 2022-01-10 DIAGNOSIS — Z0000 Encounter for general adult medical examination without abnormal findings: Secondary | ICD-10-CM

## 2022-01-10 NOTE — Progress Notes (Signed)
?Virtual Visit via Telephone Note ? ?I connected with  Justin [name] on 01/10/22 at  3:15 PM EDT by telephone and verified that I am speaking with the correct person using two identifiers. ? ?Location: ?Patient: home ?Provider: [facility] ?Persons participating in the virtual visit: patient/Nurse Health Advisor ?  ?I discussed the limitations, risks, security and privacy concerns of performing an evaluation and management service by telephone and the availability of in person appointments. The patient expressed understanding and agreed to proceed. ? ?Interactive audio and video telecommunications were attempted between this nurse and patient, however failed, due to patient having technical difficulties OR patient did not have access to video capability.  We continued and completed visit with audio only. ? ?Some vital signs may be absent or patient reported.  ? ?Hal Hope, LPN ? ?Subjective:  ? Justin [name] is a 41 y.o. male who presents for Medicare Annual/Subsequent preventive examination. ? ?Review of Systems    ? ?  ? ?   ?Objective:  ?  ?There were no vitals filed for this visit. ?There is no height or weight on file to calculate BMI. ? ? ?  10/17/2019  ?  8:09 PM 02/19/2015  ?  8:44 PM  ?Advanced Directives  ?Does Patient Have a Medical Advance Directive? Yes No  ?Type of Public librarian Power of Attorney   ?Would patient like information on creating a medical advance directive?  Yes - Educational materials given  ? ? ?Current Medications (verified) ?Outpatient Encounter Medications as of 01/10/2022  ?Medication Sig  ? albuterol (VENTOLIN HFA) 108 (90 Base) MCG/ACT inhaler Inhale 2 puffs into the lungs every 6 (six) hours as needed for wheezing or shortness of breath.  ? alprazolam (XANAX) 2 MG tablet TAKE ONE TABLET EVERY SIX HOURS IF NEEDED  ? amphetamine-dextroamphetamine (ADDERALL) 15 MG tablet TAKE ONE TABLET BY MOUTH TWICE DAILY  ? azelastine (OPTIVAR) 0.05 % ophthalmic  solution Place 1 drop into both eyes 2 (two) times daily.  ? baclofen (LIORESAL) 20 MG tablet TAKE ONE TABLET FOUR TIMES DAILY  ? clotrimazole (LOTRIMIN) 1 % cream Apply 1 application topically 2 (two) times daily.  ? clotrimazole-betamethasone (LOTRISONE) cream Apply 1 application topically 2 (two) times daily.  ? diphenhydrAMINE (BENADRYL) 25 MG tablet Take 1 tablet by mouth as needed.  ? docusate sodium (COLACE) 250 MG capsule Take 1 capsule (250 mg total) by mouth 4 (four) times daily as needed.  ? dronabinol (MARINOL) 10 MG capsule Take 1 capsule (10 mg total) by mouth every 6 (six) hours as needed.  ? fesoterodine (TOVIAZ) 8 MG TB24 tablet Take 1 tablet (8 mg total) by mouth daily.  ? flavoxATE (URISPAS) 100 MG tablet Take 1 tablet (100 mg total) by mouth 3 (three) times daily as needed for bladder spasms.  ? fluticasone (FLONASE) 50 MCG/ACT nasal spray Place 2 sprays into both nostrils daily.  ? gabapentin (NEURONTIN) 800 MG tablet Take 1 tablet (800 mg total) by mouth 4 (four) times daily.  ? levothyroxine (SYNTHROID) 88 MCG tablet Take 1 tablet (88 mcg total) by mouth daily.  ? lidocaine (LMX) 4 % cream Apply 1 application topically 3 (three) times daily.  ? linaclotide (LINZESS) 145 MCG CAPS capsule Take 1 capsule (145 mcg total) by mouth daily before breakfast.  ? methadone (DOLOPHINE) 10 MG tablet TAKE TWO TABLETS BY MOUTH EVERY 6 HOURS  ? Mineral Oil OIL daily.  ? naproxen sodium (ANAPROX DS) 550 MG tablet Take 1 tablet (550  mg total) by mouth 2 (two) times daily with a meal.  ? omeprazole (PRILOSEC) 20 MG capsule TAKE 1 CAPSULE BY MOUTH ONCE DAILY  ? oxybutynin (DITROPAN) 5 MG tablet Take 5 mg by mouth daily as needed.  ? oxyCODONE (ROXICODONE) 15 MG immediate release tablet TAKE ONE TABLET BY MOUTH EVERY 6 HOURS AS NEEDED FOR PAIN  ? PARoxetine (PAXIL) 30 MG tablet TAKE 2 TABLETS BY MOUTH DAILY  ? phenazopyridine (PYRIDIUM) 200 MG tablet Take 1 tablet (200 mg total) by mouth 3 (three) times daily.  ?  polyethylene glycol powder (QC NATURA-LAX) 17 GM/SCOOP powder MIX 1 CAPFUL IN 8 0Z OF WATER OR JUICE AND DRINK ONCE A DAY  ? promethazine (PHENERGAN) 25 MG suppository Place 1 suppository (25 mg total) rectally every 6 (six) hours as needed for nausea or vomiting.  ? promethazine (PHENERGAN) 25 MG tablet TAKE 1 TABLET BY MOUTH EVERY 4 HOURS AS NEEDED FOR NAUSEA/VOMITING  ? sildenafil (REVATIO) 20 MG tablet Take 1 tablet (20 mg total) by mouth as needed.  ? silver sulfADIAZINE (SSD) 1 % cream APPLY TO AFFECTED AREAS TOPICALLY TWICE DAILY  ? sulfamethoxazole-trimethoprim (BACTRIM DS) 800-160 MG tablet Take 1 tablet by mouth 2 (two) times daily.  ? tiZANidine (ZANAFLEX) 4 MG tablet TAKE 2 TABLETS BY MOUTH 4 TIMES DAILY  ? ?Facility-Administered Encounter Medications as of 01/10/2022  ?Medication  ? methylPREDNISolone acetate (DEPO-MEDROL) injection 40 mg  ? ? ?Allergies (verified) ?Morphine, Azithromycin, Iodine, Lorazepam, Meperidine, and Morphine sulfate  ? ?History: ?Past Medical History:  ?Diagnosis Date  ? Arthritis   ? Asthma   ? Chronic anxiety   ? Chronic depression   ? Chronic pain syndrome   ? secondary to fracture T9 and L3 with extensive spinal surgeery  ? Postoperative wound infection of right hip 2012  ? Rheumatic fever   ? ?Past Surgical History:  ?Procedure Laterality Date  ? BACK SURGERY  2000  ? Fracture T9 and L3 with paraplegia  ? ?Family History  ?Problem Relation Age of Onset  ? Other Mother   ? Hypertension Father   ? Gout Father   ? Prostate cancer Neg Hx   ? Kidney cancer Neg Hx   ? ?Social History  ? ?Socioeconomic History  ? Marital status: Married  ?  Spouse name: Not on file  ? Number of children: Not on file  ? Years of education: Not on file  ? Highest education level: Not on file  ?Occupational History  ? Occupation: unemployed  ?Tobacco Use  ? Smoking status: Former  ? Smokeless tobacco: Never  ? Tobacco comments:  ?  Using Nicorette Gum  ?Substance and Sexual Activity  ? Alcohol use:  Yes  ?  Alcohol/week: 0.0 standard drinks  ?  Comment: occasionaly-rare  ? Drug use: No  ? Sexual activity: Not on file  ?Other Topics Concern  ? Not on file  ?Social History Narrative  ? Not on file  ? ?Social Determinants of Health  ? ?Financial Resource Strain: Not on file  ?Food Insecurity: Not on file  ?Transportation Needs: Not on file  ?Physical Activity: Not on file  ?Stress: Not on file  ?Social Connections: Not on file  ? ? ?Tobacco Counseling ?Counseling given: Not Answered ?Tobacco comments: Using Nicorette Gum ? ? ?Clinical Intake: ? ?Pre-visit preparation completed: Yes ? ?Pain : No/denies pain ? ?  ? ?Nutritional Risks: None ?Diabetes: No ? ?How often do you need to have someone help you when you read  instructions, pamphlets, or other written materials from your doctor or pharmacy?: 1 - Never ? ?Diabetic?no ? ?Interpreter Needed?: No ? ?Information entered by :: Kennedy Bucker, LPN ? ? ?Activities of Daily Living ? ?  12/05/2021  ?  4:28 PM  ?In your present state of health, do you have any difficulty performing the following activities:  ?Hearing? 0  ?Vision? 1  ?Difficulty concentrating or making decisions? 1  ?Walking or climbing stairs? 1  ?Dressing or bathing? 1  ?Doing errands, shopping? 1  ? ? ?Patient Care Team: ?Malva Limes, MD as PCP - General (Family Medicine) ?Malva Limes, MD as PCP - Family Medicine (Family Medicine) ?Mick Sell, MD (Infectious Diseases) ?Randalyn Rhea, MD (Urology) ? ?Indicate any recent Medical Services you may have received from other than [facility] providers in the past year (date may be approximate). ? ?   ?Assessment:  ? This is a routine wellness examination for Thadd. ? ?Hearing/Vision screen ?No results found. ? ?Dietary issues and exercise activities discussed: ?  ? ? Goals Addressed   ?None ?  ? ?Depression Screen ? ?  12/05/2021  ?  4:26 PM 05/10/2017  ?  3:58 PM  ?PHQ 2/9 Scores  ?PHQ - 2 Score 5 5  ?PHQ- 9 Score 19 19  ?  ?Fall Risk ? ?   12/05/2021  ?  4:25 PM  ?Fall Risk   ?Falls in the past year? 1  ?Number falls in past yr: 0  ?Comment fell getting into tub  ?Injury with Fall? 1  ?Risk for fall due to : History of fall(s)  ?Follow up Falls

## 2022-01-10 NOTE — Patient Instructions (Signed)
Mr. Kauffmann , ?Thank you for taking time to come for your Medicare Wellness Visit. I appreciate your ongoing commitment to your health goals. Please review the following plan we discussed and let me know if I can assist you in the future.  ? ?Screening recommendations/referrals: ?Colonoscopy: has appointment coming up ?Recommended yearly ophthalmology/optometry visit for glaucoma screening and checkup ?Recommended yearly dental visit for hygiene and checkup ? ?Vaccinations: ?Influenza vaccine: n/d ?Pneumococcal vaccine: 06/25/14 ?Tdap vaccine: n/d ?Shingles vaccine: n/d   ?Covid-19: n/d ? ?Advanced directives: no ? ?Conditions/risks identified: none ?Next appointment: Follow up in one year for your annual wellness visit 01/15/23 @ 1pm by phone ? ?Preventive Care 40-64 Years, Male ?Preventive care refers to lifestyle choices and visits with your health care provider that can promote health and wellness. ?What does preventive care include? ?A yearly physical exam. This is also called an annual well check. ?Dental exams once or twice a year. ?Routine eye exams. Ask your health care provider how often you should have your eyes checked. ?Personal lifestyle choices, including: ?Daily care of your teeth and gums. ?Regular physical activity. ?Eating a healthy diet. ?Avoiding tobacco and drug use. ?Limiting alcohol use. ?Practicing safe sex. ?Taking low-dose aspirin every day starting at age 46. ?What happens during an annual well check? ?The services and screenings done by your health care provider during your annual well check will depend on your age, overall health, lifestyle risk factors, and family history of disease. ?Counseling  ?Your health care provider may ask you questions about your: ?Alcohol use. ?Tobacco use. ?Drug use. ?Emotional well-being. ?Home and relationship well-being. ?Sexual activity. ?Eating habits. ?Work and work Astronomer. ?Screening  ?You may have the following tests or measurements: ?Height,  weight, and BMI. ?Blood pressure. ?Lipid and cholesterol levels. These may be checked every 5 years, or more frequently if you are over 1 years old. ?Skin check. ?Lung cancer screening. You may have this screening every year starting at age 67 if you have a 30-pack-year history of smoking and currently smoke or have quit within the past 15 years. ?Fecal occult blood test (FOBT) of the stool. You may have this test every year starting at age 71. ?Flexible sigmoidoscopy or colonoscopy. You may have a sigmoidoscopy every 5 years or a colonoscopy every 10 years starting at age 30. ?Prostate cancer screening. Recommendations will vary depending on your family history and other risks. ?Hepatitis C blood test. ?Hepatitis B blood test. ?Sexually transmitted disease (STD) testing. ?Diabetes screening. This is done by checking your blood sugar (glucose) after you have not eaten for a while (fasting). You may have this done every 1-3 years. ?Discuss your test results, treatment options, and if necessary, the need for more tests with your health care provider. ?Vaccines  ?Your health care provider may recommend certain vaccines, such as: ?Influenza vaccine. This is recommended every year. ?Tetanus, diphtheria, and acellular pertussis (Tdap, Td) vaccine. You may need a Td booster every 10 years. ?Zoster vaccine. You may need this after age 64. ?Pneumococcal 13-valent conjugate (PCV13) vaccine. You may need this if you have certain conditions and have not been vaccinated. ?Pneumococcal polysaccharide (PPSV23) vaccine. You may need one or two doses if you smoke cigarettes or if you have certain conditions. ?Talk to your health care provider about which screenings and vaccines you need and how often you need them. ?This information is not intended to replace advice given to you by your health care provider. Make sure you discuss any questions you have  with your health care provider. ?Document Released: 09/30/2015 Document Revised:  05/23/2016 Document Reviewed: 07/05/2015 ?Elsevier Interactive Patient Education ? 2017 Elsevier [employer] ? ?Fall Prevention in the Home ?Falls can cause injuries. They can happen to people of all ages. There are many things you can do to make your home safe and to help prevent falls. ?What can I do on the outside of my home? ?Regularly fix the edges of walkways and driveways and fix any cracks. ?Remove anything that might make you trip as you walk through a door, such as a raised step or threshold. ?Trim any bushes or trees on the path to your home. ?Use bright outdoor lighting. ?Clear any walking paths of anything that might make someone trip, such as rocks or tools. ?Regularly check to see if handrails are loose or broken. Make sure that both sides of any steps have handrails. ?Any raised decks and porches should have guardrails on the edges. ?Have any leaves, snow, or ice cleared regularly. ?Use sand or salt on walking paths during winter. ?Clean up any spills in your garage right away. This includes oil or grease spills. ?What can I do in the bathroom? ?Use night lights. ?Install grab bars by the toilet and in the tub and shower. Do not use towel bars as grab bars. ?Use non-skid mats or decals in the tub or shower. ?If you need to sit down in the shower, use a plastic, non-slip stool. ?Keep the floor dry. Clean up any water that spills on the floor as soon as it happens. ?Remove soap buildup in the tub or shower regularly. ?Attach bath mats securely with double-sided non-slip rug tape. ?Do not have throw rugs and other things on the floor that can make you trip. ?What can I do in the bedroom? ?Use night lights. ?Make sure that you have a light by your bed that is easy to reach. ?Do not use any sheets or blankets that are too big for your bed. They should not hang down onto the floor. ?Have a firm chair that has side arms. You can use this for support while you get dressed. ?Do not have throw rugs and other things  on the floor that can make you trip. ?What can I do in the kitchen? ?Clean up any spills right away. ?Avoid walking on wet floors. ?Keep items that you use a lot in easy-to-reach places. ?If you need to reach something above you, use a strong step stool that has a grab bar. ?Keep electrical cords out of the way. ?Do not use floor polish or wax that makes floors slippery. If you must use wax, use non-skid floor wax. ?Do not have throw rugs and other things on the floor that can make you trip. ?What can I do with my stairs? ?Do not leave any items on the stairs. ?Make sure that there are handrails on both sides of the stairs and use them. Fix handrails that are broken or loose. Make sure that handrails are as long as the stairways. ?Check any carpeting to make sure that it is firmly attached to the stairs. Fix any carpet that is loose or worn. ?Avoid having throw rugs at the top or bottom of the stairs. If you do have throw rugs, attach them to the floor with carpet tape. ?Make sure that you have a light switch at the top of the stairs and the bottom of the stairs. If you do not have them, ask someone to add them for you. ?What  else can I do to help prevent falls? ?Wear shoes that: ?Do not have high heels. ?Have rubber bottoms. ?Are comfortable and fit you well. ?Are closed at the toe. Do not wear sandals. ?If you use a stepladder: ?Make sure that it is fully opened. Do not climb a closed stepladder. ?Make sure that both sides of the stepladder are locked into place. ?Ask someone to hold it for you, if possible. ?Clearly mark and make sure that you can see: ?Any grab bars or handrails. ?First and last steps. ?Where the edge of each step is. ?Use tools that help you move around (mobility aids) if they are needed. These include: ?Canes. ?Walkers. ?Scooters. ?Crutches. ?Turn on the lights when you go into a dark area. Replace any light bulbs as soon as they burn out. ?Set up your furniture so you have a clear path. Avoid  moving your furniture around. ?If any of your floors are uneven, fix them. ?If there are any pets around you, be aware of where they are. ?Review your medicines with your doctor. Some medicines can make

## 2022-01-12 ENCOUNTER — Encounter: Payer: Self-pay | Admitting: Family Medicine

## 2022-01-14 ENCOUNTER — Other Ambulatory Visit: Payer: Self-pay | Admitting: Family Medicine

## 2022-01-14 ENCOUNTER — Encounter: Payer: Self-pay | Admitting: Family Medicine

## 2022-01-14 DIAGNOSIS — N390 Urinary tract infection, site not specified: Secondary | ICD-10-CM

## 2022-01-14 LAB — URINE CULTURE

## 2022-01-15 MED ORDER — CEFDINIR 300 MG PO CAPS: 600.0000 mg | ORAL_CAPSULE | Freq: Every day | ORAL | 0 refills | Status: DC

## 2022-01-16 ENCOUNTER — Telehealth: Payer: Self-pay | Admitting: Family Medicine

## 2022-01-16 NOTE — Telephone Encounter (Signed)
That's fine, it works the same either way ?

## 2022-01-16 NOTE — Telephone Encounter (Signed)
Copied from CRM [id number]. Topic: General - Other ?>> Jan 16, 2022  3:40 PM Gaetana Michaelis A wrote: ?Reason for CRM: The patient would like to know if they're able to take their cefdinir (OMNICEF) 300 MG capsule [[id number]] separately by taking 1 capsule in the day time and 1 at night or if they need to take both simultaneously ? ?The patient would like to discuss their bladder infection further when possible  ? ?Please contact further when possible ?

## 2022-01-17 NOTE — Telephone Encounter (Signed)
Patient called and given message from Dr. Caryn Section, patient verbalized understanding. ?

## 2022-01-19 DIAGNOSIS — R339 Retention of urine, unspecified: Secondary | ICD-10-CM | POA: Diagnosis not present

## 2022-01-24 ENCOUNTER — Encounter: Payer: Self-pay | Admitting: Family Medicine

## 2022-01-25 ENCOUNTER — Other Ambulatory Visit: Payer: Self-pay | Admitting: Family Medicine

## 2022-01-25 ENCOUNTER — Other Ambulatory Visit: Payer: Self-pay | Admitting: Physician Assistant

## 2022-01-25 DIAGNOSIS — F988 Other specified behavioral and emotional disorders with onset usually occurring in childhood and adolescence: Secondary | ICD-10-CM

## 2022-01-25 DIAGNOSIS — N390 Urinary tract infection, site not specified: Secondary | ICD-10-CM

## 2022-01-25 DIAGNOSIS — G894 Chronic pain syndrome: Secondary | ICD-10-CM

## 2022-01-25 DIAGNOSIS — B952 Enterococcus as the cause of diseases classified elsewhere: Secondary | ICD-10-CM

## 2022-01-25 MED ORDER — CEFDINIR 300 MG PO CAPS: 600.0000 mg | ORAL_CAPSULE | Freq: Every day | ORAL | 0 refills | Status: AC

## 2022-01-25 MED ORDER — METHADONE HCL 10 MG PO TABS: 20.0000 mg | ORAL_TABLET | Freq: Four times a day (QID) | ORAL | 0 refills | Status: DC

## 2022-01-25 MED ORDER — AMPHETAMINE-DEXTROAMPHETAMINE 15 MG PO TABS: 1.0000 | ORAL_TABLET | Freq: Two times a day (BID) | ORAL | 0 refills | Status: DC

## 2022-01-25 MED ORDER — OXYCODONE HCL 15 MG PO TABS: 15.0000 mg | ORAL_TABLET | Freq: Four times a day (QID) | ORAL | 0 refills | Status: DC | PRN

## 2022-01-26 NOTE — Telephone Encounter (Signed)
Will refill Baclofen for a yr, until next OV.  ? ?Requested Prescriptions  ?Pending Prescriptions Disp Refills  ?? baclofen (LIORESAL) 20 MG tablet [Pharmacy Med Name: BACLOFEN 20 MG TAB] 120 each 12  ?  Sig: TAKE ONE TABLET FOUR TIMES DAILY  ?  ? Analgesics:  Muscle Relaxants - baclofen Failed - 01/25/2022  3:38 PM  ?  ?  Failed - Cr in normal range and within 180 days  ?  Creatinine  ?Date Value Ref Range Status  ?06/28/2014 0.82 0.60 - 1.30 mg/dL Final  ? ?Creatinine, Ser  ?Date Value Ref Range Status  ?12/05/2021 0.70 (L) 0.76 - 1.27 mg/dL Final  ?   ?  ?  Passed - eGFR is 30 or above and within 180 days  ?  EGFR (African American)  ?Date Value Ref Range Status  ?06/28/2014 >60 >57m/min Final  ? ?GFR calc Af AWyvonnia Lora ?Date Value Ref Range Status  ?09/15/2020 130 >59 mL/min/1.73 Final  ?  Comment:  ?  **In accordance with recommendations from the NKF-ASN Task force,** ?  [facility] is in the process of updating its eGFR calculation to the ?  2021 CKD-EPI creatinine equation that estimates kidney function ?  without a race variable. ?  ? ?EGFR (Non-African Amer.)  ?Date Value Ref Range Status  ?06/28/2014 >60 >640mmin Final  ?  Comment:  ?  eGFR values <6074min/1.73 m2 may be an indication of chronic ?kidney disease (CKD). ?Calculated eGFR, using the MRDR Study equation, is useful in  ?patients with stable renal function. ?The eGFR calculation will not be reliable in acutely ill patients ?when serum creatinine is changing rapidly. It is not useful in ?patients on dialysis. The eGFR calculation may not be applicable ?to patients at the low and high extremes of body sizes, pregnant ?women, and vegetarians. ?  ? ?GFR calc non Af Amer  ?Date Value Ref Range Status  ?09/15/2020 113 >59 mL/min/1.73 Final  ? ?eGFR  ?Date Value Ref Range Status  ?12/05/2021 119 >59 mL/min/1.73 Final  ?   ?  ?  Passed - Valid encounter within last 6 months  ?  Recent Outpatient Visits   ?      ? [facility] weeks [facility]  ? 1 month [facility] 3 months ago [facility] injury of right buttock, stage 1  ? [facility]  ? 3 months ago [facility] of right buttock, stage 1  [facility]  ? 4 months [facility]  [facility]  ?  ?  ? ?  ?  ?  ?? amphetamine-dextroamphetamine (ADDERALL) 15 MG tablet [Pharmacy Med Name: AMPHETAMINE-DEXTROAMPHETAMINE 15 MG] 60 tablet   ?  Sig: TAKE ONE (1) TABLET BY MOUTH TWO TIMES PER DAY  ?  ? Not Delegated - Psychiatry:  Stimulants/ADHD Failed - 01/25/2022  3:38 PM  ?  ?  Failed - This refill cannot be delegated  ?  ?  Failed - Urine Drug Screen completed in last 360 days  ?  ?  Failed - Last Heart Rate in normal range  ?  Pulse Readings from Last 1 Encounters:  ?12/05/21 (!) 148  ?   ?  ?  Passed - Last BP in normal range  ?  BP Readings from Last 1 Encounters:  ?  12/05/21 100/84  ?   ?  ?  Passed - Valid encounter within last 6 months  ?  Recent Outpatient Visits   ?      ? 2 weeks ago Dysuria  ? [facility] Caryn Section, Kirstie Peri, MD  ? 1 month ago Dysuria  ? [facility] [place], [place], [place]  ? 3 months ago Pressure injury of right buttock, stage 1  ? [facility] Birdie Sons, MD  ? 3 months ago Pressure injury of right buttock, stage 1  ? [facility] Birdie Sons, MD  ? 4 months ago Chronic constipation  ? [facility] Caryn Section, Kirstie Peri, MD  ?  ?  ? ?  ?  ?  ?? methadone (DOLOPHINE) 10 MG tablet [Pharmacy Med Name: METHADONE HCL 10 MG TAB] 240 tablet   ?  Sig: TAKE 2 TABLETS BY MOUTH EVERY 6 HOURS  ?  ? Not Delegated - Analgesics:  Opioid Agonists - methadone hydrochloride Failed - 01/25/2022  3:38 PM  ?  ?  Failed - This refill cannot be delegated  ?  ?  Failed - Cr in normal range and within 360 days  ?  Creatinine  ?Date  Value Ref Range Status  ?06/28/2014 0.82 0.60 - 1.30 mg/dL Final  ? ?Creatinine, Ser  ?Date Value Ref Range Status  ?12/05/2021 0.70 (L) 0.76 - 1.27 mg/dL Final  ?   ?  ?  Failed - Urine Drug Screen completed in last 360 days  ?  ?  Passed - AST in normal range and within 360 days  ?  AST  ?Date Value Ref Range Status  ?12/05/2021 20 0 - 40 IU/L Final  ? ?SGOT(AST)  ?Date Value Ref Range Status  ?06/25/2014 15 15 - 37 Unit/L Final  ?   ?  ?  Passed - ALT in normal range and within 360 days  ?  ALT  ?Date Value Ref Range Status  ?12/05/2021 22 0 - 44 IU/L Final  ? ?SGPT (ALT)  ?Date Value Ref Range Status  ?06/25/2014 18 U/L Final  ?  Comment:  ?  14-63 ?NOTE: New Reference Range ?04/06/14 ?  ?   ?  ?  Passed - Valid encounter within last 3 months  ?  Recent Outpatient Visits   ?      ? 2 weeks ago Dysuria  ? [facility] Caryn Section, Kirstie Peri, MD  ? 1 month ago Dysuria  ? [facility] [place], [place], [place]  ? 3 months ago Pressure injury of right buttock, stage 1  ? [facility] Birdie Sons, MD  ? 3 months ago Pressure injury of right buttock, stage 1  ? [facility] Birdie Sons, MD  ? 4 months ago Chronic constipation  ? [facility], Kirstie Peri, MD  ?  ?  ? ?  ?  ?  ?? oxyCODONE (ROXICODONE) 15 MG immediate release tablet [Pharmacy Med Name: OXYCODONE HCL 15 MG TAB] 120 tablet   ?  Sig: TAKE 1 TABLET BY MOUTH EVERY 6 HOURS AS NEEDED FOR PAIN  ?  ? Not Delegated - Analgesics:  Opioid Agonists Failed - 01/25/2022  3:38 PM  ?  ?  Failed - This refill cannot be delegated  ?  ?  Failed - Urine Drug Screen completed in last 360 days  ?  ?  Passed - Valid encounter within last 3 months  ?  Recent Outpatient Visits   ?      ? 2 weeks ago Dysuria  ? [facility] Caryn Section, Kirstie Peri, MD  ? 1 month ago Dysuria  ? [facility], [place], [place]  ? 3 months ago Pressure injury of right buttock, stage 1  ? [facility] Birdie Sons, MD  ? 3 months ago Pressure injury of right buttock, stage 1  ? [facility] Birdie Sons, [facility]  ? 4 months ago Chronic constipation  ? [facility] Caryn Section, Kirstie Peri, MD  ?  ?  ? ?  ?  ?  ? ? ?

## 2022-01-26 NOTE — Telephone Encounter (Signed)
Requested medication (s) are due for refill today: no ? ?Requested medication (s) are on the active medication list: yes ? ?Last refill:  01/25/22 ? ?Future visit scheduled: yes ? ?Notes to clinic:  Unable to refill per protocol, last refill by provider 01/25/22. This is a duplicate request. ? ? ?  ?Requested Prescriptions  ?Pending Prescriptions Disp Refills  ? amphetamine-dextroamphetamine (ADDERALL) 15 MG tablet [Pharmacy Med Name: AMPHETAMINE-DEXTROAMPHETAMINE 15 MG] 60 tablet   ?  Sig: TAKE ONE (1) TABLET BY MOUTH TWO TIMES PER DAY  ?  ? Not Delegated - Psychiatry:  Stimulants/ADHD Failed - 01/25/2022  3:38 PM  ?  ?  Failed - This refill cannot be delegated  ?  ?  Failed - Urine Drug Screen completed in last 360 days  ?  ?  Failed - Last Heart Rate in normal range  ?  Pulse Readings from Last 1 Encounters:  ?12/05/21 (!) 148  ?   ?  ?  Passed - Last BP in normal range  ?  BP Readings from Last 1 Encounters:  ?12/05/21 100/84  ?   ?  ?  Passed - Valid encounter within last 6 months  ?  Recent Outpatient Visits   ? ?      ? 2 weeks ago Dysuria  ? [facility] Caryn Section, Kirstie Peri, MD  ? 1 month ago Dysuria  ? [facility] [place], [place], [place]  ? 3 months ago Pressure injury of right buttock, stage 1  ? [facility] Birdie Sons, MD  ? 3 months ago Pressure injury of right buttock, stage 1  ? [facility] Birdie Sons, MD  ? 4 months ago Chronic constipation  ? [facility] Caryn Section, Kirstie Peri, MD  ? ?  ?  ? ? ?  ?  ?  ? methadone (DOLOPHINE) 10 MG tablet [Pharmacy Med Name: METHADONE HCL 10 MG TAB] 240 tablet   ?  Sig: TAKE 2 TABLETS BY MOUTH EVERY 6 HOURS  ?  ? Not Delegated - Analgesics:  Opioid Agonists - methadone hydrochloride Failed - 01/25/2022  3:38 PM  ?  ?  Failed - This refill cannot be delegated  ?  ?  Failed - Cr in normal range and within 360 days  ?  Creatinine  ?Date Value Ref Range Status  ?06/28/2014 0.82 0.60 - 1.30 mg/dL Final   ? ?Creatinine, Ser  ?Date Value Ref Range Status  ?12/05/2021 0.70 (L) 0.76 - 1.27 mg/dL Final  ?   ?  ?  Failed - Urine Drug Screen completed in last 360 days  ?  ?  Passed - AST in normal range and within 360 days  ?  AST  ?Date Value Ref Range Status  ?12/05/2021 20 0 - 40 IU/L Final  ? ?SGOT(AST)  ?Date Value Ref Range Status  ?06/25/2014 15 15 - 37 Unit/L Final  ?   ?  ?  Passed - ALT in normal range and within 360 days  ?  ALT  ?Date Value Ref Range Status  ?12/05/2021 22 0 - 44 IU/L Final  ? ?SGPT (ALT)  ?Date Value Ref Range Status  ?06/25/2014 18 U/L Final  ?  Comment:  ?  14-63 ?NOTE: New Reference Range ?04/06/14 ?  ?   ?  ?  Passed - Valid encounter within last 3 months  ?  Recent Outpatient Visits   ? ?      ? 2 weeks ago  Dysuria  ? [facility] Caryn Section, Kirstie Peri, MD  ? 1 month ago Dysuria  ? [facility] [place], [place], [place]  ? 3 months ago Pressure injury of right buttock, stage 1  ? [facility] Birdie Sons, MD  ? 3 months ago Pressure injury of right buttock, stage 1  ? [facility] Birdie Sons, MD  ? 4 months ago Chronic constipation  ? [facility] Birdie Sons, MD  ? ?  ?  ? ? ?  ?  ?  ? oxyCODONE (ROXICODONE) 15 MG immediate release tablet [Pharmacy Med Name: OXYCODONE HCL 15 MG TAB] 120 tablet   ?  Sig: TAKE 1 TABLET BY MOUTH EVERY 6 HOURS AS NEEDED FOR PAIN  ?  ? Not Delegated - Analgesics:  Opioid Agonists Failed - 01/25/2022  3:38 PM  ?  ?  Failed - This refill cannot be delegated  ?  ?  Failed - Urine Drug Screen completed in last 360 days  ?  ?  Passed - Valid encounter within last 3 months  ?  Recent Outpatient Visits   ? ?      ? 2 weeks ago Dysuria  ? [facility] Caryn Section, Kirstie Peri, MD  ? 1 month ago Dysuria  ? [facility] [place], [place], [place]  ? 3 months ago Pressure injury of right buttock, stage 1  ? [facility] Birdie Sons, MD  ? 3 months ago Pressure  injury of right buttock, stage 1  ? [facility] Birdie Sons, MD  ? 4 months ago Chronic constipation  ? [facility] Caryn Section, Kirstie Peri, MD  ? ?  ?  ? ? ?  ?  ?  ?Signed Prescriptions Disp Refills  ? baclofen (LIORESAL) 20 MG tablet 120 each 12  ?  Sig: TAKE ONE TABLET FOUR TIMES DAILY  ?  ? Analgesics:  Muscle Relaxants - baclofen Failed - 01/25/2022  3:38 PM  ?  ?  Failed - Cr in normal range and within 180 days  ?  Creatinine  ?Date Value Ref Range Status  ?06/28/2014 0.82 0.60 - 1.30 mg/dL Final  ? ?Creatinine, Ser  ?Date Value Ref Range Status  ?12/05/2021 0.70 (L) 0.76 - 1.27 mg/dL Final  ?   ?  ?  Passed - eGFR is 30 or above and within 180 days  ?  EGFR (African American)  ?Date Value Ref Range Status  ?06/28/2014 >60 >62m/min Final  ? ?GFR calc Af AWyvonnia Lora ?Date Value Ref Range Status  ?09/15/2020 130 >59 mL/min/1.73 Final  ?  Comment:  ?  **In accordance with recommendations from the NKF-ASN Task force,** ?  [facility] is in the process of updating its eGFR calculation to the ?  2021 CKD-EPI creatinine equation that estimates kidney function ?  without a race variable. ?  ? ?EGFR (Non-African Amer.)  ?Date Value Ref Range Status  ?06/28/2014 >60 >642mmin Final  ?  Comment:  ?  eGFR values <6053min/1.73 m2 may be an indication of chronic ?kidney disease (CKD). ?Calculated eGFR, using the MRDR Study equation, is useful in  ?patients with stable renal function. ?The eGFR calculation will not be reliable in acutely ill patients ?when serum creatinine is changing rapidly. It is not useful in ?patients on dialysis. The eGFR calculation may not be applicable ?to patients at the low and high extremes of body sizes, pregnant ?women, and vegetarians. ?  ? ?GFR  calc non Af Amer  ?Date Value Ref Range Status  ?09/15/2020 113 >59 mL/min/1.73 Final  ? ?eGFR  ?Date Value Ref Range Status  ?12/05/2021 119 >59 mL/min/1.73 Final  ?   ?  ?  Passed - Valid encounter within last 6 months  ?  Recent  Outpatient Visits   ? ?      ? 2 weeks ago Dysuria  ? [facility] Caryn Section, Kirstie Peri, MD  ? 1 month ago Dysuria  ? [facility] [place], [place], [place]  ? 3 months ago Pressure injury of right buttock, stage 1  ? [facility] Birdie Sons, MD  ? 3 months ago Pressure injury of right buttock, stage 1  ? [facility] Birdie Sons, MD  ? 4 months ago Chronic constipation  ? [facility] Caryn Section, Kirstie Peri, MD  ? ?  ?  ? ? ?  ?  ?  ? ? ?

## 2022-02-05 ENCOUNTER — Telehealth: Payer: Self-pay

## 2022-02-05 NOTE — Telephone Encounter (Signed)
Copied from [place] [id number]. Topic: General - Other >> Feb 05, 2022  4:32 PM Yvette Rack wrote: Reason for CRM: Pt stated he needs a new Rx for foley catheter for 30 french 30 cc, 30 french 5 cc, and 28 french 30 cc to be sent to [facility] by fax to fax# [phone] or going to their website [email or web address] Pt also requests that the Rx for sildenafil (REVATIO) 20 MG tablet is way to few. Pt stated he would like that Rx to be increased to 30 tablets. Pt requests call back to advise.

## 2022-02-13 ENCOUNTER — Encounter: Payer: Self-pay | Admitting: Family Medicine

## 2022-02-14 ENCOUNTER — Telehealth (INDEPENDENT_AMBULATORY_CARE_PROVIDER_SITE_OTHER): Payer: Medicare HMO | Admitting: Family Medicine

## 2022-02-14 DIAGNOSIS — Z91199 Patient's noncompliance with other medical treatment and regimen due to unspecified reason: Secondary | ICD-10-CM

## 2022-02-14 MED ORDER — SILDENAFIL CITRATE 20 MG PO TABS: ORAL_TABLET | ORAL | 3 refills | Status: DC

## 2022-02-14 NOTE — Addendum Note (Signed)
Addended by: Malva Limes on: 02/14/2022 01:35 PM   Modules accepted: Orders

## 2022-02-14 NOTE — Progress Notes (Signed)
  No show for virtual visit, unable to reach by telephone

## 2022-02-15 NOTE — Progress Notes (Signed)
I,Jamarkus Lisbon Robinson,acting as a Education administrator for [employer], PA-C.,have documented all relevant documentation on the behalf of [name], PA-C,as directed by  [employer], PA-C while in the presence of [employer], PA-C.   Established patient visit   Patient: Justin [name]   DOB: Mar 23, 1981   41 y.o. Male  MRN: [id number] Visit Date: 02/16/2022  Today's healthcare provider: [name], PA-C   No chief complaint on file. CC: scratch on urethra / penis, testicle swelling  Subjective    Justin [name] is a 41 yr old male presenting for scratch on urethra. Thinks he scratched it 1 week ago due to a pin in new pants.  Also c/o penis and testicle swelling. Reports significant improvement since yesterday.   Also c/o right hand swelling since Tuesday.  Reports he started Cipro 500 mg (old) on Tuesday in case of infection.   Reports that a similar swelling p accidentally hitting/scratching his hands that might last for a few weeks is a common occurrence for him. He is accompanied by his spouse.  Medications: Outpatient Medications Prior to Visit  Medication Sig   albuterol (VENTOLIN HFA) 108 (90 Base) MCG/ACT inhaler Inhale 2 puffs into the lungs every 6 (six) hours as needed for wheezing or shortness of breath.   alprazolam (XANAX) 2 MG tablet TAKE ONE TABLET EVERY SIX HOURS IF NEEDED   amphetamine-dextroamphetamine (ADDERALL) 15 MG tablet Take 1 tablet by mouth 2 (two) times daily.   azelastine (OPTIVAR) 0.05 % ophthalmic solution Place 1 drop into both eyes 2 (two) times daily.   baclofen (LIORESAL) 20 MG tablet TAKE ONE TABLET FOUR TIMES DAILY   clotrimazole (LOTRIMIN) 1 % cream Apply 1 application topically 2 (two) times daily.   clotrimazole-betamethasone (LOTRISONE) cream Apply 1 application topically 2 (two) times daily.   diphenhydrAMINE (BENADRYL) 25 MG tablet Take 1 tablet by mouth as needed.   docusate sodium (COLACE) 250 MG capsule Take 1 capsule (250 mg  total) by mouth 4 (four) times daily as needed.   dronabinol (MARINOL) 10 MG capsule Take 1 capsule (10 mg total) by mouth every 6 (six) hours as needed.   fesoterodine (TOVIAZ) 8 MG TB24 tablet Take 1 tablet (8 mg total) by mouth daily.   flavoxATE (URISPAS) 100 MG tablet Take 1 tablet (100 mg total) by mouth 3 (three) times daily as needed for bladder spasms.   fluticasone (FLONASE) 50 MCG/ACT nasal spray Place 2 sprays into both nostrils daily.   gabapentin (NEURONTIN) 800 MG tablet Take 1 tablet (800 mg total) by mouth 4 (four) times daily.   levothyroxine (SYNTHROID) 88 MCG tablet Take 1 tablet (88 mcg total) by mouth daily.   lidocaine (LMX) 4 % cream Apply 1 application topically 3 (three) times daily.   linaclotide (LINZESS) 145 MCG CAPS capsule Take 1 capsule (145 mcg total) by mouth daily before breakfast.   methadone (DOLOPHINE) 10 MG tablet Take 2 tablets (20 mg total) by mouth every 6 (six) hours.   Mineral Oil OIL daily.   naproxen sodium (ANAPROX DS) 550 MG tablet Take 1 tablet (550 mg total) by mouth 2 (two) times daily with a meal.   omeprazole (PRILOSEC) 20 MG capsule TAKE 1 CAPSULE BY MOUTH ONCE DAILY   oxybutynin (DITROPAN) 5 MG tablet Take 5 mg by mouth daily as needed.   oxyCODONE (ROXICODONE) 15 MG immediate release tablet Take 1 tablet (15 mg total) by mouth every 6 (six) hours as needed. for pain   phenazopyridine (  PYRIDIUM) 200 MG tablet Take 1 tablet (200 mg total) by mouth 3 (three) times daily.   polyethylene glycol powder (QC NATURA-LAX) 17 GM/SCOOP powder MIX 1 CAPFUL IN 8 0Z OF WATER OR JUICE AND DRINK ONCE A DAY   promethazine (PHENERGAN) 25 MG suppository Place 1 suppository (25 mg total) rectally every 6 (six) hours as needed for nausea or vomiting.   promethazine (PHENERGAN) 25 MG tablet TAKE 1 TABLET BY MOUTH EVERY 4 HOURS AS NEEDED FOR NAUSEA/VOMITING   sildenafil (REVATIO) 20 MG tablet Take 1 tablet (20 mg total) by mouth as needed.   sildenafil (REVATIO) 20  MG tablet Take 3 to 5 tablets two hours before intercouse on an empty stomach.  Do not take with nitrates.   silver sulfADIAZINE (SSD) 1 % cream APPLY TO AFFECTED AREAS TOPICALLY TWICE DAILY   sulfamethoxazole-trimethoprim (BACTRIM DS) 800-160 MG tablet Take 1 tablet by mouth 2 (two) times daily.   tiZANidine (ZANAFLEX) 4 MG tablet TAKE 2 TABLETS BY MOUTH 4 TIMES DAILY   PARoxetine (PAXIL) 30 MG tablet TAKE 2 TABLETS BY MOUTH DAILY (Patient not taking: Reported on 01/10/2022)   Facility-Administered Medications Prior to Visit  Medication Dose Route Frequency Provider   methylPREDNISolone acetate (DEPO-MEDROL) injection 40 mg  40 mg Intramuscular Once Birdie Sons, MD    Review of Systems  Respiratory: Negative.    Cardiovascular: Negative.   All other systems reviewed and are negative. Except HPI    Objective    BP 90/63 (BP Location: Right Arm, Patient Position: Sitting, Cuff Size: Normal)   Pulse 78   Temp 97.7 F (36.5 C) (Oral)   Resp 16   SpO2 98%    Physical Exam Vitals reviewed.  Constitutional:      Appearance: Normal appearance.  HENT:     Head: Normocephalic and atraumatic.  Pulmonary:     Effort: Pulmonary effort is normal.  Genitourinary:    Penis: Normal.      Testes: Normal.     Rectum: Normal.     Comments: No enlargement of inguinal lymph nodes noted No swelling, no redness Musculoskeletal:        General: Swelling present. No tenderness.     Comments: Small punctate on the dorsal aspect of the right hand Swelling of the dorsal aspect of the right hand  Neurological:     Mental Status: He is alert and oriented to person, place, and time.  Psychiatric:        Behavior: Behavior normal.        Thought Content: Thought content normal.        Judgment: Judgment normal.      No results found for any visits on 02/16/22.  Assessment & Plan     1. Need for tetanus booster  - Tdap vaccine greater than or equal to 7yo IM  2. Puncture  wound Continue cipro Benefits and risks were discussed. Patient agreed and expressed his understanding.  Rest, ice, elevation and compression of the right hand were suggested Keep wound clean was advised. Tdap vaccination - updated today.  3. Chronic UTI Continue cipro Has been taking for the past 4 days   FU as needed  Needs CPE      The patient was advised to call back or seek an in-person evaluation if the symptoms worsen or if the condition fails to improve as anticipated.  I discussed the assessment and treatment plan with the patient. The patient was provided an opportunity to ask questions  and all were answered. The patient agreed with the plan and demonstrated an understanding of the instructions.  The entirety of the information documented in the History of Present Illness, Review of Systems and Physical Exam were personally obtained by me. Portions of this information were initially documented by the CMA and reviewed by me for thoroughness and accuracy.  Portions of this note were created using dictation software and may contain typographical errors.     [name], PA-C  [facility] [phone] (phone) [phone] (fax)  [place]

## 2022-02-16 ENCOUNTER — Encounter: Payer: Self-pay | Admitting: Physician Assistant

## 2022-02-16 ENCOUNTER — Ambulatory Visit (INDEPENDENT_AMBULATORY_CARE_PROVIDER_SITE_OTHER): Payer: Medicare HMO | Admitting: Physician Assistant

## 2022-02-16 VITALS — BP 90/63 | HR 78 | Temp 97.7°F | Resp 16

## 2022-02-16 DIAGNOSIS — N390 Urinary tract infection, site not specified: Secondary | ICD-10-CM | POA: Diagnosis not present

## 2022-02-16 DIAGNOSIS — Z23 Encounter for immunization: Secondary | ICD-10-CM

## 2022-02-16 DIAGNOSIS — T148XXA Other injury of unspecified body region, initial encounter: Secondary | ICD-10-CM | POA: Diagnosis not present

## 2022-02-20 ENCOUNTER — Ambulatory Visit: Payer: Medicare HMO | Admitting: Gastroenterology

## 2022-02-20 ENCOUNTER — Ambulatory Visit: Payer: Medicare HMO | Admitting: Physician Assistant

## 2022-02-20 DIAGNOSIS — R339 Retention of urine, unspecified: Secondary | ICD-10-CM | POA: Diagnosis not present

## 2022-02-26 ENCOUNTER — Other Ambulatory Visit: Payer: Self-pay | Admitting: Family Medicine

## 2022-02-26 DIAGNOSIS — F988 Other specified behavioral and emotional disorders with onset usually occurring in childhood and adolescence: Secondary | ICD-10-CM

## 2022-02-26 DIAGNOSIS — G894 Chronic pain syndrome: Secondary | ICD-10-CM

## 2022-03-16 ENCOUNTER — Telehealth: Payer: Self-pay

## 2022-03-16 NOTE — Telephone Encounter (Signed)
Copied from CRM [id number]. Topic: General - Inquiry >> Mar 16, 2022 11:54 AM Haroldine Laws wrote: Reason for CRM: Pt called saying he thinks he has a UTI.  He said [facility] usually gets him to send a urine sample over. CB# [phone]

## 2022-03-19 ENCOUNTER — Encounter: Payer: Self-pay | Admitting: Physician Assistant

## 2022-03-19 ENCOUNTER — Ambulatory Visit
Admission: RE | Admit: 2022-03-19 | Discharge: 2022-03-19 | Disposition: A | Discharge status: Home / Self Care | Payer: Medicare HMO | Source: Ambulatory Visit | Attending: Family Medicine | Admitting: Family Medicine

## 2022-03-19 ENCOUNTER — Ambulatory Visit
Admission: RE | Admit: 2022-03-19 | Discharge: 2022-03-19 | Disposition: A | Discharge status: Home / Self Care | Payer: Medicare HMO | Attending: Family Medicine | Admitting: Family Medicine

## 2022-03-19 ENCOUNTER — Telehealth: Payer: Self-pay | Admitting: Physician Assistant

## 2022-03-19 ENCOUNTER — Ambulatory Visit (INDEPENDENT_AMBULATORY_CARE_PROVIDER_SITE_OTHER): Payer: Medicare HMO | Admitting: Physician Assistant

## 2022-03-19 VITALS — BP 109/75 | HR 92 | Temp 98.1°F | Resp 16

## 2022-03-19 DIAGNOSIS — M1612 Unilateral primary osteoarthritis, left hip: Secondary | ICD-10-CM | POA: Diagnosis not present

## 2022-03-19 DIAGNOSIS — M21952 Unspecified acquired deformity of left thigh: Secondary | ICD-10-CM | POA: Diagnosis not present

## 2022-03-19 DIAGNOSIS — N390 Urinary tract infection, site not specified: Secondary | ICD-10-CM | POA: Diagnosis not present

## 2022-03-19 DIAGNOSIS — G8220 Paraplegia, unspecified: Secondary | ICD-10-CM

## 2022-03-19 DIAGNOSIS — H1013 Acute atopic conjunctivitis, bilateral: Secondary | ICD-10-CM

## 2022-03-19 DIAGNOSIS — R21 Rash and other nonspecific skin eruption: Secondary | ICD-10-CM | POA: Diagnosis not present

## 2022-03-19 LAB — POCT URINALYSIS DIPSTICK
Appearance: NORMAL
Blood, UA: NEGATIVE
Glucose, UA: NEGATIVE
Ketones, UA: NEGATIVE
Nitrite, UA: NEGATIVE
Protein, UA: NEGATIVE
Spec Grav, UA: 1.01 (ref 1.010–1.025)
Urobilinogen, UA: 1 E.U./dL
pH, UA: 5.5 (ref 5.0–8.0)

## 2022-03-19 MED ORDER — CEPHALEXIN 500 MG PO CAPS: 500.0000 mg | ORAL_CAPSULE | Freq: Two times a day (BID) | ORAL | 0 refills | Status: DC

## 2022-03-19 MED ORDER — AZELASTINE HCL 0.05 % OP SOLN: 1.0000 [drp] | Freq: Two times a day (BID) | OPHTHALMIC | 4 refills | Status: DC

## 2022-03-19 NOTE — Telephone Encounter (Signed)
Patient informed and agreeable but wants the wheelchair evaluation appt soon because he's anxious for a new chair.  Is it okay to schedule with you? Dr. Theodis Aguas schedule is way out.  He is aware I will call him back Wed.

## 2022-03-19 NOTE — Telephone Encounter (Signed)
Pt scheduled to see Janna at 2:00 today.

## 2022-03-19 NOTE — Telephone Encounter (Signed)
Please, let pt know that I have been looking through the requirements for [employer]'s wheelchair order. Pt needs to address this matter during his physical examination/ with his PCP in 2 weeks. PE with focus on functional assessment is included as a requirement for this order. Fortunately, you will have an annual physical in 2 weeks.

## 2022-03-19 NOTE — Telephone Encounter (Signed)
PS. Per PCP, pt needs to set up a separate appt for evaluation/a new wheelchair

## 2022-03-19 NOTE — Telephone Encounter (Signed)
Correction 1:00 today.

## 2022-03-19 NOTE — Progress Notes (Unsigned)
I,Jana Zahli Vetsch,acting as a Neurosurgeon for [employer], PA-C.,have documented all relevant documentation on the behalf of Debera Lat, PA-C,as directed by  [employer], PA-C while in the presence of [employer], PA-C.    Established patient visit   Patient: Justin [name]   DOB: 1980/10/12   41 y.o. Male  MRN: [id number] Visit Date: 03/19/2022  Today's healthcare provider: Debera Lat, PA-C   Chief Complaint  Patient presents with   Dysuria  CC: dysuria  Subjective    Urinary symptoms  He reports recurrent dysuria. The current episode started a few days ago and is staying constant. Patient states symptoms are moderate in intensity, occurring constantly. He  has been recently treated for similar symptoms.    Associated symptoms: No abdominal pain Yes back pain  No chills No constipation  No cramping No diarrhea  No discharge Yes fever  No hematuria Yes nausea  No vomiting    ---------------------------------------------------------------------------------------   Medications: Outpatient Medications Prior to Visit  Medication Sig   albuterol (VENTOLIN HFA) 108 (90 Base) MCG/ACT inhaler Inhale 2 puffs into the lungs every 6 (six) hours as needed for wheezing or shortness of breath.   alprazolam (XANAX) 2 MG tablet TAKE ONE TABLET EVERY SIX HOURS IF NEEDED   amphetamine-dextroamphetamine (ADDERALL) 15 MG tablet TAKE ONE TABLET BY MOUTH TWICE DAILY   azelastine (OPTIVAR) 0.05 % ophthalmic solution Place 1 drop into both eyes 2 (two) times daily.   baclofen (LIORESAL) 20 MG tablet TAKE ONE TABLET FOUR TIMES DAILY   clotrimazole (LOTRIMIN) 1 % cream Apply 1 application topically 2 (two) times daily.   clotrimazole-betamethasone (LOTRISONE) cream Apply 1 application topically 2 (two) times daily.   diphenhydrAMINE (BENADRYL) 25 MG tablet Take 1 tablet by mouth as needed.   docusate sodium (COLACE) 250 MG capsule Take 1 capsule (250 mg total) by mouth 4 (four)  times daily as needed.   dronabinol (MARINOL) 10 MG capsule Take 1 capsule (10 mg total) by mouth every 6 (six) hours as needed.   fesoterodine (TOVIAZ) 8 MG TB24 tablet Take 1 tablet (8 mg total) by mouth daily.   flavoxATE (URISPAS) 100 MG tablet Take 1 tablet (100 mg total) by mouth 3 (three) times daily as needed for bladder spasms.   fluticasone (FLONASE) 50 MCG/ACT nasal spray Place 2 sprays into both nostrils daily.   gabapentin (NEURONTIN) 800 MG tablet Take 1 tablet (800 mg total) by mouth 4 (four) times daily.   levothyroxine (SYNTHROID) 88 MCG tablet Take 1 tablet (88 mcg total) by mouth daily.   lidocaine (LMX) 4 % cream Apply 1 application topically 3 (three) times daily.   linaclotide (LINZESS) 145 MCG CAPS capsule Take 1 capsule (145 mcg total) by mouth daily before breakfast.   methadone (DOLOPHINE) 10 MG tablet TAKE TWO TABLETS BY MOUTH EVERY 6 HOURS   Mineral Oil OIL daily.   naproxen sodium (ANAPROX DS) 550 MG tablet Take 1 tablet (550 mg total) by mouth 2 (two) times daily with a meal.   omeprazole (PRILOSEC) 20 MG capsule TAKE 1 CAPSULE BY MOUTH ONCE DAILY   oxybutynin (DITROPAN) 5 MG tablet Take 5 mg by mouth daily as needed.   oxyCODONE (ROXICODONE) 15 MG immediate release tablet TAKE ONE TABLET BY MOUTH EVERY 6 HOURS AS NEEDED FOR PAIN   phenazopyridine (PYRIDIUM) 200 MG tablet Take 1 tablet (200 mg total) by mouth 3 (three) times daily.   polyethylene glycol powder (QC NATURA-LAX) 17 GM/SCOOP powder MIX 1  CAPFUL IN 8 0Z OF WATER OR JUICE AND DRINK ONCE A DAY   promethazine (PHENERGAN) 25 MG suppository Place 1 suppository (25 mg total) rectally every 6 (six) hours as needed for nausea or vomiting.   promethazine (PHENERGAN) 25 MG tablet TAKE 1 TABLET BY MOUTH EVERY 4 HOURS AS NEEDED FOR NAUSEA/VOMITING   sildenafil (REVATIO) 20 MG tablet Take 1 tablet (20 mg total) by mouth as needed.   sildenafil (REVATIO) 20 MG tablet Take 3 to 5 tablets two hours before intercouse on  an empty stomach.  Do not take with nitrates.   silver sulfADIAZINE (SSD) 1 % cream APPLY TO AFFECTED AREAS TOPICALLY TWICE DAILY   sulfamethoxazole-trimethoprim (BACTRIM DS) 800-160 MG tablet Take 1 tablet by mouth 2 (two) times daily.   tiZANidine (ZANAFLEX) 4 MG tablet TAKE 2 TABLETS BY MOUTH 4 TIMES DAILY   Facility-Administered Medications Prior to Visit  Medication Dose Route Frequency Provider   methylPREDNISolone acetate (DEPO-MEDROL) injection 40 mg  40 mg Intramuscular Once Malva Limes, MD    Review of Systems      Objective    BP 109/75 (BP Location: Right Arm, Patient Position: Sitting, Cuff Size: Normal)   Pulse 92   Temp 98.1 F (36.7 C) (Oral)   Resp 16   SpO2 97%  {Show previous vital signs (optional):23777}  Physical Exam  ***  No results found for any visits on 03/19/22.  Assessment & Plan     ***  No follow-ups on file.      {provider attestation***:1}   Debera Lat, Cordelia Poche  [facility] [phone] (phone) [phone] (fax)  [facility]

## 2022-03-20 NOTE — Progress Notes (Incomplete)
I,Desire Fulp Robinson,acting as a Neurosurgeon for [employer], PA-C.,have documented all relevant documentation on the behalf of Debera Lat, PA-C,as directed by  [employer], PA-C while in the presence of [employer], PA-C.    Established patient visit   Patient: Justin [name]   DOB: 1981-09-17   41 y.o. Male  MRN: [id number] Visit Date: 03/19/2022  Today's healthcare provider: Debera Lat, PA-C   Chief Complaint  Patient presents with  . Dysuria   Subjective    Urinary symptoms  He reports recurrent dysuria. The current episode started a few days ago and is staying constant. Patient states symptoms are moderate in intensity, occurring constantly. He  has been recently treated for similar symptoms.    Associated symptoms: No abdominal pain Yes back pain  No chills No constipation  No cramping No diarrhea  No discharge Yes fever  No hematuria Yes nausea  No vomiting     Pt requested a refill for antihistamine eye drops for allergic conjuctivitis Pt requested to place an order for a new wheelchair Pt reports that he has been having rash on his arms. A few years ago, He saw dermatologist and was dx-ed with eczema and was rxed 'cream' which was helpful. Requests a referral to dermatology. ---------------------------------------------------------------------------------------   Medications: Outpatient Medications Prior to Visit  Medication Sig  . albuterol (VENTOLIN HFA) 108 (90 Base) MCG/ACT inhaler Inhale 2 puffs into the lungs every 6 (six) hours as needed for wheezing or shortness of breath.  . alprazolam (XANAX) 2 MG tablet TAKE ONE TABLET EVERY SIX HOURS IF NEEDED  . amphetamine-dextroamphetamine (ADDERALL) 15 MG tablet TAKE ONE TABLET BY MOUTH TWICE DAILY  . azelastine (OPTIVAR) 0.05 % ophthalmic solution Place 1 drop into both eyes 2 (two) times daily.  . baclofen (LIORESAL) 20 MG tablet TAKE ONE TABLET FOUR TIMES DAILY  . clotrimazole (LOTRIMIN) 1 %  cream Apply 1 application topically 2 (two) times daily.  . clotrimazole-betamethasone (LOTRISONE) cream Apply 1 application topically 2 (two) times daily.  . diphenhydrAMINE (BENADRYL) 25 MG tablet Take 1 tablet by mouth as needed.  . docusate sodium (COLACE) 250 MG capsule Take 1 capsule (250 mg total) by mouth 4 (four) times daily as needed.  . dronabinol (MARINOL) 10 MG capsule Take 1 capsule (10 mg total) by mouth every 6 (six) hours as needed.  . fesoterodine (TOVIAZ) 8 MG TB24 tablet Take 1 tablet (8 mg total) by mouth daily.  . flavoxATE (URISPAS) 100 MG tablet Take 1 tablet (100 mg total) by mouth 3 (three) times daily as needed for bladder spasms.  . fluticasone (FLONASE) 50 MCG/ACT nasal spray Place 2 sprays into both nostrils daily.  Marland Kitchen gabapentin (NEURONTIN) 800 MG tablet Take 1 tablet (800 mg total) by mouth 4 (four) times daily.  Marland Kitchen levothyroxine (SYNTHROID) 88 MCG tablet Take 1 tablet (88 mcg total) by mouth daily.  Marland Kitchen lidocaine (LMX) 4 % cream Apply 1 application topically 3 (three) times daily.  Marland Kitchen linaclotide (LINZESS) 145 MCG CAPS capsule Take 1 capsule (145 mcg total) by mouth daily before breakfast.  . methadone (DOLOPHINE) 10 MG tablet TAKE TWO TABLETS BY MOUTH EVERY 6 HOURS  . Mineral Oil OIL daily.  . naproxen sodium (ANAPROX DS) 550 MG tablet Take 1 tablet (550 mg total) by mouth 2 (two) times daily with a meal.  . omeprazole (PRILOSEC) 20 MG capsule TAKE 1 CAPSULE BY MOUTH ONCE DAILY  . oxybutynin (DITROPAN) 5 MG tablet Take 5 mg by mouth  daily as needed.  Marland Kitchen oxyCODONE (ROXICODONE) 15 MG immediate release tablet TAKE ONE TABLET BY MOUTH EVERY 6 HOURS AS NEEDED FOR PAIN  . phenazopyridine (PYRIDIUM) 200 MG tablet Take 1 tablet (200 mg total) by mouth 3 (three) times daily.  . polyethylene glycol powder (QC NATURA-LAX) 17 GM/SCOOP powder MIX 1 CAPFUL IN 8 0Z OF WATER OR JUICE AND DRINK ONCE A DAY  . promethazine (PHENERGAN) 25 MG suppository Place 1 suppository (25 mg total)  rectally every 6 (six) hours as needed for nausea or vomiting.  . promethazine (PHENERGAN) 25 MG tablet TAKE 1 TABLET BY MOUTH EVERY 4 HOURS AS NEEDED FOR NAUSEA/VOMITING  . sildenafil (REVATIO) 20 MG tablet Take 1 tablet (20 mg total) by mouth as needed.  . sildenafil (REVATIO) 20 MG tablet Take 3 to 5 tablets two hours before intercouse on an empty stomach.  Do not take with nitrates.  . silver sulfADIAZINE (SSD) 1 % cream APPLY TO AFFECTED AREAS TOPICALLY TWICE DAILY  . sulfamethoxazole-trimethoprim (BACTRIM DS) 800-160 MG tablet Take 1 tablet by mouth 2 (two) times daily.  Marland Kitchen tiZANidine (ZANAFLEX) 4 MG tablet TAKE 2 TABLETS BY MOUTH 4 TIMES DAILY   Facility-Administered Medications Prior to Visit  Medication Dose Route Frequency Provider  . methylPREDNISolone acetate (DEPO-MEDROL) injection 40 mg  40 mg Intramuscular Once Malva Limes, MD    Review of Systems  Constitutional:  Positive for fever.  Genitourinary:  Positive for dysuria.   See HPI     Objective    BP 109/75 (BP Location: Right Arm, Patient Position: Sitting, Cuff Size: Normal)   Pulse 92   Temp 98.1 F (36.7 C) (Oral)   Resp 16   SpO2 97%  {Show previous vital signs (optional):23777}  Physical Exam Vitals reviewed.  Constitutional:      Appearance: Normal appearance. He is obese.  HENT:     Head: Normocephalic and atraumatic.  Pulmonary:     Effort: Pulmonary effort is normal.  Musculoskeletal:     Cervical back: Normal range of motion.  Skin:    Comments: Firm, minimally rough, 1- to 2-mm, follicle-based papules. Distribution is symmetric. Noted on the lateral aspects of the arms   Neurological:     Mental Status: He is alert and oriented to person, place, and time. Mental status is at baseline.  Psychiatric:        Behavior: Behavior normal.        Thought Content: Thought content normal.        Judgment: Judgment normal.      No results found for any visits on 03/19/22.  Assessment & Plan      1. Recurrent UTI Per pt , has been having fever at home Vitals normal at OV - cephALEXin (KEFLEX) 500 MG capsule; Take 1 capsule (500 mg total) by mouth 2 (two) times daily.  Dispense: 14 capsule; Refill: 0 - POCT Urinalysis Dipstick leuko - Urine Culture - Urinalysis, Routine w reflex microscopic  2. Allergic conjunctivitis of both eyes - azelastine (OPTIVAR) 0.05 % ophthalmic solution; Place 1 drop into both eyes 2 (two) times daily.  Dispense: 6 mL; Refill: 4  3. Rash in adult Eczema? Keratosis pilaris? Daily measures to prevent dry skin, such as using mild cleansers, along with moisturizers, are the mainstay of treatment Lactic acid 12% creams/lotions (e.g., ammonium lactate: AmLactin Ultra, Lac-Hydrin) Urea (in 40-50% topical preparations) - Ambulatory referral to Dermatology  Pt was advised to set up an appt with PCP for an  order for a new wheelchair in 2 weeks.   The patient was advised to call back or seek an in-person evaluation if the symptoms worsen or if the condition fails to improve as anticipated.  I discussed the assessment and treatment plan with the patient. The patient was provided an opportunity to ask questions and all were answered. The patient agreed with the plan and demonstrated an understanding of the instructions.  The entirety of the information documented in the History of Present Illness, Review of Systems and Physical Exam were personally obtained by me. Portions of this information were initially documented by the CMA and reviewed by me for thoroughness and accuracy.  Portions of this note were created using dictation software and may contain typographical errors.       Total encounter time more than 30 minutes  Greater than 50% was spent in counseling and coordination of care with the patient   Cherlynn Polo  [facility] [phone] (phone) [phone] (fax)  [facility]

## 2022-03-21 ENCOUNTER — Other Ambulatory Visit: Payer: Self-pay | Admitting: Family Medicine

## 2022-03-21 DIAGNOSIS — F988 Other specified behavioral and emotional disorders with onset usually occurring in childhood and adolescence: Secondary | ICD-10-CM

## 2022-03-21 DIAGNOSIS — G894 Chronic pain syndrome: Secondary | ICD-10-CM

## 2022-03-21 LAB — URINALYSIS, ROUTINE W REFLEX MICROSCOPIC
Bilirubin, UA: NEGATIVE
Glucose, UA: NEGATIVE
Ketones, UA: NEGATIVE
Leukocytes,UA: NEGATIVE
Nitrite, UA: NEGATIVE
Protein,UA: NEGATIVE
RBC, UA: NEGATIVE
Specific Gravity, UA: 1.021 (ref 1.005–1.030)
Urobilinogen, Ur: 0.2 mg/dL (ref 0.2–1.0)
pH, UA: 7.5 (ref 5.0–7.5)

## 2022-03-21 LAB — URINE CULTURE: Organism ID, Bacteria: NO GROWTH

## 2022-03-21 NOTE — Telephone Encounter (Signed)
Pt informed and agreeable.  We will schedule after physical appt due to pt needing to check with wife's schedule to bring him into appt.

## 2022-03-21 NOTE — Telephone Encounter (Signed)
Please, let pt know that an appt for wheelchair evaluation should be scheduled after physical(in 2 weeks). If it is ok with him, please, schedule an appt with me after his physical.

## 2022-03-23 DIAGNOSIS — R339 Retention of urine, unspecified: Secondary | ICD-10-CM | POA: Diagnosis not present

## 2022-03-26 ENCOUNTER — Ambulatory Visit: Payer: Medicare HMO | Admitting: Physician Assistant

## 2022-03-27 ENCOUNTER — Ambulatory Visit: Payer: Self-pay | Admitting: *Deleted

## 2022-03-27 MED ORDER — METHADONE HCL 10 MG PO TABS: 20.0000 mg | ORAL_TABLET | Freq: Four times a day (QID) | ORAL | 0 refills | Status: DC

## 2022-03-27 MED ORDER — OXYCODONE HCL 15 MG PO TABS: 15.0000 mg | ORAL_TABLET | Freq: Four times a day (QID) | ORAL | 0 refills | Status: DC | PRN

## 2022-03-27 MED ORDER — AMPHETAMINE-DEXTROAMPHETAMINE 15 MG PO TABS
1.0000 | ORAL_TABLET | Freq: Two times a day (BID) | ORAL | 0 refills | Status: DC
Start: 2022-03-27 — End: 2022-04-21

## 2022-03-27 NOTE — Telephone Encounter (Signed)
oxyCODONE (ROXICODONE) 15 MG immediate release tablet 120 tablet 0 03/27/2022  Sig - Route: Take 1 tablet (15 mg total) by mouth every 6 (six) hours as needed. for pain -   Per agent: "[facility] Pharmacist has called states he does have the 30 mg with a score and would Dr [name] approved of pt cutting at score for the 15 mg? Please call pharmacy back at [phone] to advise if dr approves. "   Spoke with pharmacist. Requesting approval to give pt 30mg  scored tabs, lesser amount to refill. Please advise.  [phone]  Reason for Disposition  [1] Pharmacy calling with prescription question AND [2] triager unable to answer question  Answer Assessment - Initial Assessment Questions 1. NAME of MEDICATION: "What medicine are you calling about?"     Oxy 15mg  2. QUESTION: "What is your question?" (e.g., double dose of medicine, side effect)     Pharmacist calling regarding scoring tablet 3. PRESCRIBING HCP: "Who prescribed it?" Reason: if prescribed by specialist, call should be referred to that group.     PCP  Protocols used: Medication Question Call-A-AH

## 2022-03-29 ENCOUNTER — Telehealth: Payer: Self-pay

## 2022-03-29 NOTE — Telephone Encounter (Signed)
Copied from CRM [id number]. Topic: General - Other >> Mar 29, 2022 12:57 PM Everette C wrote: Reason for CRM: The patient has been directed by their pharmacy to contact their PCP and request an increase in their prescription for oxyCODONE (ROXICODONE) 15 MG immediate release tablet [[id number]] to  MG with directions to just take half a tablet   The patient was directed to do so because of scarcity of the medication   The patient is also interested in being prescribed HYDROmorphone as al alternative if possible   The patient is concerned that the increased OxyCODONE prescription will cause skin and stomach irritation   Please contact the patient further when possible

## 2022-04-03 ENCOUNTER — Telehealth: Payer: Self-pay | Admitting: Family Medicine

## 2022-04-03 ENCOUNTER — Other Ambulatory Visit: Payer: Self-pay

## 2022-04-03 DIAGNOSIS — G894 Chronic pain syndrome: Secondary | ICD-10-CM

## 2022-04-03 MED ORDER — OXYCODONE HCL 30 MG PO TABS: 15.0000 mg | ORAL_TABLET | Freq: Four times a day (QID) | ORAL | 0 refills | Status: DC | PRN

## 2022-04-03 NOTE — Telephone Encounter (Signed)
Pt stated that [facility] doesnt have oxyCODONE (ROXICODONE) 15 MG immediate release tablet in stock and he is asking for an alternative to be sent to [facility] pharmacy / pt asked for a call when this message is received

## 2022-04-05 ENCOUNTER — Encounter: Payer: Self-pay | Admitting: Family Medicine

## 2022-04-06 ENCOUNTER — Ambulatory Visit: Payer: Self-pay

## 2022-04-06 NOTE — Telephone Encounter (Signed)
  Chief Complaint: Abdominal Pain - UTI Symptoms: pain burning, ants in abdomen feeling Frequency: 2 nights ago Pertinent Negatives: Patient denies fever Disposition: [] ED /[x] Urgent Care (no appt availability in office) / [] Appointment(In office/virtual)/ []  [place] [facility]/ [] Home Care/ [] Refused Recommended Disposition /[] [place] [facility]/ []  Follow-up with PCP Additional Notes: Pt has frequent UTIs. Pt believes that this is one. Pain, burning and spasms. Reason for Disposition  [1] MILD-MODERATE pain AND [2] constant AND [3] present > 2 hours  Answer Assessment - Initial Assessment Questions 1. LOCATION: "Where does it hurt?"      abdomen 2. RADIATION: "Does the pain shoot anywhere else?" (e.g., chest, back)     Feels like ants in abdomen 3. ONSET: "When did the pain begin?" (Minutes, hours or days ago)      2 nights ago 4. SUDDEN: "Gradual or sudden onset?"     Fairly sudden 5. PATTERN "Does the pain come and go, or is it constant?"    - If it comes and goes: "How long does it last?" "Do you have pain now?"     (Note: Comes and goes means the pain is intermittent. It goes away completely between bouts.)    - If constant: "Is it getting better, staying the same, or getting worse?"      (Note: Constant means the pain never goes away completely; most serious pain is constant and gets worse.)      Comes and goes - never really resolves 6. SEVERITY: "How bad is the pain?"  (e.g., Scale 1-10; mild, moderate, or severe)    - MILD (1-3): Doesn't interfere with normal activities, abdomen soft and not tender to touch.     - MODERATE (4-7): Interferes with normal activities or awakens from sleep, abdomen tender to touch.     - SEVERE (8-10): Excruciating pain, doubled over, unable to do any normal activities.       6-7/10 7. RECURRENT SYMPTOM: "Have you ever had this type of stomach pain before?" If Yes, ask: "When was the last time?" and "What happened that time?"      yes 8.  CAUSE: "What do you think is causing the stomach pain?"     Bladder infection 9. RELIEVING/AGGRAVATING FACTORS: "What makes it better or worse?" (e.g., antacids, bending or twisting motion, bowel movement)     Pain medication 10. OTHER SYMPTOMS: "Do you have any other symptoms?" (e.g., back pain, diarrhea, fever, urination pain, vomiting)       burning  Protocols used: Abdominal Pain - Male-A-AH

## 2022-04-07 DIAGNOSIS — R399 Unspecified symptoms and signs involving the genitourinary system: Secondary | ICD-10-CM | POA: Diagnosis not present

## 2022-04-08 DIAGNOSIS — R399 Unspecified symptoms and signs involving the genitourinary system: Secondary | ICD-10-CM | POA: Diagnosis not present

## 2022-04-09 ENCOUNTER — Ambulatory Visit: Payer: Medicare HMO | Admitting: Family Medicine

## 2022-04-09 ENCOUNTER — Encounter: Payer: Medicare HMO | Admitting: Physician Assistant

## 2022-04-10 ENCOUNTER — Telehealth: Payer: Self-pay | Admitting: Physician Assistant

## 2022-04-10 NOTE — Telephone Encounter (Signed)
Please, let pt know that Dr. [name] is out of office.   Please, check on his symptoms. If urinary symptoms are still present, and if he is okay with it, he might schedule with me this week

## 2022-04-10 NOTE — Telephone Encounter (Signed)
Patient called, left VM to return the call to the office for a message from the provider, he will need to speak to a nurse.

## 2022-04-10 NOTE — Telephone Encounter (Signed)
LMTRC Okay for [facility] triage to inquire.   

## 2022-04-10 NOTE — Telephone Encounter (Signed)
Pt returned our call.  Pt went to UC and has a pending UA/culture and sensitivity. He will follow up with them for the time being. He expects results in the next day or so. Pt will call back if he needs further assistance.

## 2022-04-11 ENCOUNTER — Ambulatory Visit: Payer: Self-pay

## 2022-04-11 ENCOUNTER — Other Ambulatory Visit: Payer: Self-pay

## 2022-04-11 ENCOUNTER — Telehealth: Payer: Self-pay

## 2022-04-11 DIAGNOSIS — R300 Dysuria: Secondary | ICD-10-CM | POA: Diagnosis not present

## 2022-04-11 DIAGNOSIS — R319 Hematuria, unspecified: Secondary | ICD-10-CM

## 2022-04-11 NOTE — Telephone Encounter (Signed)
    Chief Complaint: Urinary pain  Symptoms: Pain, chills, low grade fever Frequency: Last week  Pertinent Negatives: Patient denies  Disposition: [] ED /[] Urgent Care (no appt availability in office) / [] Appointment(In office/virtual)/ []  [place] [facility]/ [] Home Care/ [] Refused Recommended Disposition /[] [place] [facility]/ [x]  Follow-up with PCP Additional Notes: Asking if he can send in a urine specimen. Also asking for medication to apply to urethra for pain.   Answer Assessment - Initial Assessment Questions 1. SEVERITY: "How bad is the pain?"  (e.g., Scale 1-10; mild, moderate, or severe)   - MILD (1-3): Complains slightly about urination hurting.   - MODERATE (4-7): Interferes with normal activities.     - SEVERE (8-10): Excruciating, unwilling or unable to urinate because of the pain.      Severe 2. FREQUENCY: "How many times have you had painful urination today?"      Every time 3. PATTERN: "Is pain present every time you urinate or just sometimes?"      Yes 4. ONSET: "When did the painful urination start?"      Last week 5. FEVER: "Do you have a fever?" If Yes, ask: "What is your temperature, how was it measured, and when did it start?"     Low grade 6. PAST UTI: "Have you had a urine infection before?" If Yes, ask: "When was the last time?" and "What happened that time?"      Yes 7. CAUSE: "What do you think is causing the painful urination?"      UTI 8. OTHER SYMPTOMS: "Do you have any other symptoms?" (e.g., flank pain, penis discharge, scrotal pain, blood in urine)     nO  Protocols used: Urination Pain - Male-A-AH

## 2022-04-11 NOTE — Telephone Encounter (Signed)
FYI-Patient was called from the UC that his urine didn't grow any bacteria. Patient is having pain ans spasm. He was prescribed Cipro. Advised wife to bring urine sample. Patient doesn't have any history of kidney stone.

## 2022-04-11 NOTE — Telephone Encounter (Signed)
Please see other phone encounter

## 2022-04-11 NOTE — Telephone Encounter (Signed)
Pt called and stated that he is having burning with urination. He states that it is very painful. Pt was previous treated for an UTI, but he feels that the medication did not work. He would to drop off an urine sample to be tested. He has a virtual visit scheduled 04/11/22 with Simonne Maffucci. Please advise pt when a lab order has been placed to drop off urine sample. Best contact [phone] or call wife Ramona at [phone].

## 2022-04-11 NOTE — Telephone Encounter (Signed)
LM stating ok to drop off urine.

## 2022-04-12 ENCOUNTER — Telehealth (INDEPENDENT_AMBULATORY_CARE_PROVIDER_SITE_OTHER): Payer: Medicare HMO | Admitting: Physician Assistant

## 2022-04-12 DIAGNOSIS — R252 Cramp and spasm: Secondary | ICD-10-CM | POA: Diagnosis not present

## 2022-04-12 DIAGNOSIS — E038 Other specified hypothyroidism: Secondary | ICD-10-CM

## 2022-04-12 DIAGNOSIS — S24109S Unspecified injury at unspecified level of thoracic spinal cord, sequela: Secondary | ICD-10-CM

## 2022-04-12 DIAGNOSIS — G8220 Paraplegia, unspecified: Secondary | ICD-10-CM | POA: Diagnosis not present

## 2022-04-12 DIAGNOSIS — F419 Anxiety disorder, unspecified: Secondary | ICD-10-CM

## 2022-04-12 DIAGNOSIS — K5909 Other constipation: Secondary | ICD-10-CM

## 2022-04-12 DIAGNOSIS — T83518A Infection and inflammatory reaction due to other urinary catheter, initial encounter: Secondary | ICD-10-CM

## 2022-04-12 DIAGNOSIS — K219 Gastro-esophageal reflux disease without esophagitis: Secondary | ICD-10-CM

## 2022-04-12 DIAGNOSIS — N390 Urinary tract infection, site not specified: Secondary | ICD-10-CM

## 2022-04-12 DIAGNOSIS — F909 Attention-deficit hyperactivity disorder, unspecified type: Secondary | ICD-10-CM

## 2022-04-12 LAB — SPECIMEN STATUS REPORT

## 2022-04-12 LAB — URINALYSIS, MICROSCOPIC ONLY
Bacteria, UA: NONE SEEN
Casts: NONE SEEN /lpf
WBC, UA: NONE SEEN /hpf (ref 0–5)

## 2022-04-12 NOTE — Progress Notes (Signed)
MyChart Video Visit    Virtual Visit via Video Note   This visit type was conducted due to national recommendations for restrictions regarding the COVID-19 Pandemic (e.g. social distancing) in an effort to limit this patient's exposure and mitigate transmission in our community. This patient is at least at moderate risk for complications without adequate follow up. This format is felt to be most appropriate for this patient at this time. Physical exam was limited by quality of the video and audio technology used for the visit.   Patient location: home  Provider location: [facility]   I discussed the limitations of evaluation and management by telemedicine and the availability of in person appointments. The patient expressed understanding and agreed to proceed.  Patient: Justin [name]   DOB: [birth year]   41 y.o. Male  MRN: [id number] Visit Date: 04/12/2022  Today's healthcare provider: Debera Lat, PA-C  CC: burning with urination  Subjective    Patient reports burning with urination.  Saw UC for urination /video and sent a urine sample Started on Cipro. Stopped after urine cx was negative.   Medications: Outpatient Medications Prior to Visit  Medication Sig  . albuterol (VENTOLIN HFA) 108 (90 Base) MCG/ACT inhaler Inhale 2 puffs into the lungs every 6 (six) hours as needed for wheezing or shortness of breath.  . alprazolam (XANAX) 2 MG tablet TAKE ONE TABLET EVERY SIX HOURS IF NEEDED  . amphetamine-dextroamphetamine (ADDERALL) 15 MG tablet Take 1 tablet by mouth 2 (two) times daily.  Marland Kitchen azelastine (OPTIVAR) 0.05 % ophthalmic solution Place 1 drop into both eyes 2 (two) times daily.  . baclofen (LIORESAL) 20 MG tablet TAKE ONE TABLET FOUR TIMES DAILY  . cephALEXin (KEFLEX) 500 MG capsule Take 1 capsule (500 mg total) by mouth 2 (two) times daily.  . clotrimazole (LOTRIMIN) 1 % cream Apply 1 application topically 2 (two) times daily.  .  clotrimazole-betamethasone (LOTRISONE) cream Apply 1 application topically 2 (two) times daily.  . diphenhydrAMINE (BENADRYL) 25 MG tablet Take 1 tablet by mouth as needed.  . docusate sodium (COLACE) 250 MG capsule Take 1 capsule (250 mg total) by mouth 4 (four) times daily as needed.  . dronabinol (MARINOL) 10 MG capsule Take 1 capsule (10 mg total) by mouth every 6 (six) hours as needed.  . fesoterodine (TOVIAZ) 8 MG TB24 tablet Take 1 tablet (8 mg total) by mouth daily.  . flavoxATE (URISPAS) 100 MG tablet Take 1 tablet (100 mg total) by mouth 3 (three) times daily as needed for bladder spasms.  . fluticasone (FLONASE) 50 MCG/ACT nasal spray Place 2 sprays into both nostrils daily.  Marland Kitchen gabapentin (NEURONTIN) 800 MG tablet Take 1 tablet (800 mg total) by mouth 4 (four) times daily.  Marland Kitchen levothyroxine (SYNTHROID) 88 MCG tablet Take 1 tablet (88 mcg total) by mouth daily.  Marland Kitchen lidocaine (LMX) 4 % cream Apply 1 application topically 3 (three) times daily.  Marland Kitchen linaclotide (LINZESS) 145 MCG CAPS capsule Take 1 capsule (145 mcg total) by mouth daily before breakfast.  . methadone (DOLOPHINE) 10 MG tablet Take 2 tablets (20 mg total) by mouth every 6 (six) hours.  . Mineral Oil OIL daily.  . naproxen sodium (ANAPROX DS) 550 MG tablet Take 1 tablet (550 mg total) by mouth 2 (two) times daily with a meal.  . omeprazole (PRILOSEC) 20 MG capsule TAKE 1 CAPSULE BY MOUTH ONCE DAILY  . oxybutynin (DITROPAN) 5 MG tablet Take 5 mg by mouth daily as needed.  Marland Kitchen  oxyCODONE (ROXICODONE) 30 MG immediate release tablet Take 0.5 tablets (15 mg total) by mouth every 6 (six) hours as needed. for pain  . phenazopyridine (PYRIDIUM) 200 MG tablet Take 1 tablet (200 mg total) by mouth 3 (three) times daily.  . polyethylene glycol powder (QC NATURA-LAX) 17 GM/SCOOP powder MIX 1 CAPFUL IN 8 0Z OF WATER OR JUICE AND DRINK ONCE A DAY  . promethazine (PHENERGAN) 25 MG suppository Place 1 suppository (25 mg total) rectally every 6  (six) hours as needed for nausea or vomiting.  . promethazine (PHENERGAN) 25 MG tablet TAKE 1 TABLET BY MOUTH EVERY 4 HOURS AS NEEDED FOR NAUSEA/VOMITING  . sildenafil (REVATIO) 20 MG tablet Take 1 tablet (20 mg total) by mouth as needed.  . sildenafil (REVATIO) 20 MG tablet Take 3 to 5 tablets two hours before intercouse on an empty stomach.  Do not take with nitrates.  . silver sulfADIAZINE (SSD) 1 % cream APPLY TO AFFECTED AREAS TOPICALLY TWICE DAILY  . sulfamethoxazole-trimethoprim (BACTRIM DS) 800-160 MG tablet Take 1 tablet by mouth 2 (two) times daily.  Marland Kitchen tiZANidine (ZANAFLEX) 4 MG tablet TAKE 2 TABLETS BY MOUTH 4 TIMES DAILY   Facility-Administered Medications Prior to Visit  Medication Dose Route Frequency Provider  . methylPREDNISolone acetate (DEPO-MEDROL) injection 40 mg  40 mg Intramuscular Once Malva Limes, MD    Review of Systems  All other systems reviewed and are negative. See HPI  {Labs  Heme  Chem  Endocrine  Serology  Results Review (optional):23779}   Objective    There were no vitals taken for this visit.  {Show previous vital signs (optional):23777}   Physical Exam Constitutional:      General: He is not in acute distress.    Appearance: Normal appearance. He is not diaphoretic.  HENT:     Head: Normocephalic.  Eyes:     Conjunctiva/sclera: Conjunctivae normal.  Pulmonary:     Effort: Pulmonary effort is normal. No respiratory distress.  Neurological:     Mental Status: He is alert and oriented to person, place, and time. Mental status is at baseline.       Assessment & Plan      Injury of thoracic spinal cord, sequela (HCC)  - AMB Referral to [facility]ordin[facility]  Paraplegia (HCC)  - AMB Referral to Community [facility] Coordin[facility]  Anxiety  - AMB Referral to Community [facility] Coordinaton  Attention deficit hyperactivity disorder (ADHD), unspecified ADHD type  - AMB Referral to [facility]ordin[facility]  Chronic UTI Patient  was strongly advised to see Urology - AMB Referral to [facility] Coordinaton   Spasm Went through entire pt's list . Recommended to start taking PRN med for spasm Recommended to take gabapentin considering burning sensation - AMB Referral to [facility] Coordination for med management   Infection due to urethral catheter (HCC) Urine culture was negative. Cipro was d/c after 2 days of taking - AMB Referral to [facility] Coordination   FU PRN    I discussed the assessment and treatment plan with the patient. The patient was provided an opportunity to ask questions and all were answered. The patient agreed with the plan and demonstrated an understanding of the instructions.   The patient was advised to call back or seek an in-person evaluation if the symptoms worsen or if the condition fails to improve as anticipated.  I provided 45 minutes of non-face-to-face time during this encounter. The patient was advised to call back or seek an in-person  evaluation if the symptoms worsen or if the condition fails to improve as anticipated.  I discussed the assessment and treatment plan with the patient. The patient was provided an opportunity to ask questions and all were answered. The patient agreed with the plan and demonstrated an understanding of the instructions.  The entirety of the information documented in the History of Present Illness, Review of Systems and Physical Exam were personally obtained by me. Portions of this information were initially documented by the CMA and reviewed by me for thoroughness and accuracy.  Portions of this note were created using dictation software and may contain typographical errors.    Debera Lat, PA-C [facility] [phone] (phone) [phone] (fax)  [facility]

## 2022-04-13 LAB — SPECIMEN STATUS REPORT

## 2022-04-13 LAB — URINE CULTURE: Organism ID, Bacteria: NO GROWTH

## 2022-04-16 ENCOUNTER — Encounter: Payer: Self-pay | Admitting: Physician Assistant

## 2022-04-16 ENCOUNTER — Ambulatory Visit (INDEPENDENT_AMBULATORY_CARE_PROVIDER_SITE_OTHER): Payer: Medicare HMO | Admitting: Physician Assistant

## 2022-04-16 VITALS — BP 122/76 | HR 81 | Temp 98.5°F | Resp 16

## 2022-04-16 DIAGNOSIS — N319 Neuromuscular dysfunction of bladder, unspecified: Secondary | ICD-10-CM

## 2022-04-16 DIAGNOSIS — T83518A Infection and inflammatory reaction due to other urinary catheter, initial encounter: Secondary | ICD-10-CM

## 2022-04-16 DIAGNOSIS — R252 Cramp and spasm: Secondary | ICD-10-CM | POA: Diagnosis not present

## 2022-04-16 DIAGNOSIS — L309 Dermatitis, unspecified: Secondary | ICD-10-CM

## 2022-04-16 DIAGNOSIS — L89300 Pressure ulcer of unspecified buttock, unstageable: Secondary | ICD-10-CM

## 2022-04-16 DIAGNOSIS — M21952 Unspecified acquired deformity of left thigh: Secondary | ICD-10-CM

## 2022-04-16 DIAGNOSIS — R21 Rash and other nonspecific skin eruption: Secondary | ICD-10-CM

## 2022-04-16 DIAGNOSIS — N390 Urinary tract infection, site not specified: Secondary | ICD-10-CM

## 2022-04-16 DIAGNOSIS — G8220 Paraplegia, unspecified: Secondary | ICD-10-CM

## 2022-04-16 DIAGNOSIS — S24109S Unspecified injury at unspecified level of thoracic spinal cord, sequela: Secondary | ICD-10-CM

## 2022-04-16 DIAGNOSIS — G894 Chronic pain syndrome: Secondary | ICD-10-CM | POA: Diagnosis not present

## 2022-04-16 DIAGNOSIS — M25551 Pain in right hip: Secondary | ICD-10-CM | POA: Diagnosis not present

## 2022-04-16 DIAGNOSIS — M25552 Pain in left hip: Secondary | ICD-10-CM

## 2022-04-16 NOTE — Progress Notes (Unsigned)
I,[name],acting as a Neurosurgeon for [employer], PA-C.,have documented all relevant documentation on the behalf of Justin Lat, PA-C,as directed by  [employer], PA-C while in the presence of [employer], PA-C.   Established patient visit   Patient: Justin [name]   DOB: 03-06-1981   41 y.o. Male  MRN: [id number] Visit Date: 04/16/2022  Today's healthcare provider: Debera Lat, PA-C   No chief complaint on file.  Subjective    Patient presents for new wheelchair assessment.  Reports bladder spasm / "feels like rod going in urethra and a red poker in anus." Onset 2 wks.   Requesting refill for Lidocaine LMX 4% cream and Prevident 5000 dry mouth toothpaste.   Medications: Outpatient Medications Prior to Visit  Medication Sig   albuterol (VENTOLIN HFA) 108 (90 Base) MCG/ACT inhaler Inhale 2 puffs into the lungs every 6 (six) hours as needed for wheezing or shortness of breath.   alprazolam (XANAX) 2 MG tablet TAKE ONE TABLET EVERY SIX HOURS IF NEEDED   amphetamine-dextroamphetamine (ADDERALL) 15 MG tablet Take 1 tablet by mouth 2 (two) times daily.   azelastine (OPTIVAR) 0.05 % ophthalmic solution Place 1 drop into both eyes 2 (two) times daily.   baclofen (LIORESAL) 20 MG tablet TAKE ONE TABLET FOUR TIMES DAILY   clotrimazole (LOTRIMIN) 1 % cream Apply 1 application topically 2 (two) times daily.   clotrimazole-betamethasone (LOTRISONE) cream Apply 1 application topically 2 (two) times daily.   diphenhydrAMINE (BENADRYL) 25 MG tablet Take 1 tablet by mouth as needed.   docusate sodium (COLACE) 250 MG capsule Take 1 capsule (250 mg total) by mouth 4 (four) times daily as needed.   dronabinol (MARINOL) 10 MG capsule Take 1 capsule (10 mg total) by mouth every 6 (six) hours as needed.   fesoterodine (TOVIAZ) 8 MG TB24 tablet Take 1 tablet (8 mg total) by mouth daily.   flavoxATE (URISPAS) 100 MG tablet Take 1 tablet (100 mg total) by mouth 3 (three) times daily  as needed for bladder spasms.   fluticasone (FLONASE) 50 MCG/ACT nasal spray Place 2 sprays into both nostrils daily.   gabapentin (NEURONTIN) 800 MG tablet Take 1 tablet (800 mg total) by mouth 4 (four) times daily.   levothyroxine (SYNTHROID) 88 MCG tablet Take 1 tablet (88 mcg total) by mouth daily.   lidocaine (LMX) 4 % cream Apply 1 application topically 3 (three) times daily.   linaclotide (LINZESS) 145 MCG CAPS capsule Take 1 capsule (145 mcg total) by mouth daily before breakfast.   methadone (DOLOPHINE) 10 MG tablet Take 2 tablets (20 mg total) by mouth every 6 (six) hours.   Mineral Oil OIL daily.   naproxen sodium (ANAPROX DS) 550 MG tablet Take 1 tablet (550 mg total) by mouth 2 (two) times daily with a meal.   omeprazole (PRILOSEC) 20 MG capsule TAKE 1 CAPSULE BY MOUTH ONCE DAILY   oxybutynin (DITROPAN) 5 MG tablet Take 5 mg by mouth daily as needed.   oxyCODONE (ROXICODONE) 30 MG immediate release tablet Take 0.5 tablets (15 mg total) by mouth every 6 (six) hours as needed. for pain   phenazopyridine (PYRIDIUM) 200 MG tablet Take 1 tablet (200 mg total) by mouth 3 (three) times daily.   polyethylene glycol powder (QC NATURA-LAX) 17 GM/SCOOP powder MIX 1 CAPFUL IN 8 0Z OF WATER OR JUICE AND DRINK ONCE A DAY   promethazine (PHENERGAN) 25 MG suppository Place 1 suppository (25 mg total) rectally every 6 (six) hours  as needed for nausea or vomiting.   promethazine (PHENERGAN) 25 MG tablet TAKE 1 TABLET BY MOUTH EVERY 4 HOURS AS NEEDED FOR NAUSEA/VOMITING   sildenafil (REVATIO) 20 MG tablet Take 1 tablet (20 mg total) by mouth as needed.   sildenafil (REVATIO) 20 MG tablet Take 3 to 5 tablets two hours before intercouse on an empty stomach.  Do not take with nitrates.   silver sulfADIAZINE (SSD) 1 % cream APPLY TO AFFECTED AREAS TOPICALLY TWICE DAILY   tiZANidine (ZANAFLEX) 4 MG tablet TAKE 2 TABLETS BY MOUTH 4 TIMES DAILY   cephALEXin (KEFLEX) 500 MG capsule Take 1 capsule (500 mg  total) by mouth 2 (two) times daily.   sulfamethoxazole-trimethoprim (BACTRIM DS) 800-160 MG tablet Take 1 tablet by mouth 2 (two) times daily.   Facility-Administered Medications Prior to Visit  Medication Dose Route Frequency Provider   methylPREDNISolone acetate (DEPO-MEDROL) injection 40 mg  40 mg Intramuscular Once Malva Limes, MD    Review of Systems  {Labs  Heme  Chem  Endocrine  Serology  Results Review (optional):23779}   Objective    BP 122/76 (BP Location: Right Arm, Patient Position: Sitting, Cuff Size: Normal)   Pulse 81   Temp 98.5 F (36.9 C) (Oral)   Resp 16   SpO2 98%  {Show previous vital signs (optional):23777}  Physical Exam  ***  No results found for any visits on 04/16/22.  Assessment & Plan     1. Chronic UTI  - Urine Culture - Urinalysis, microscopic only  2. Rash in adult/tender not pruritic - lidocaine cream   3. Spasm Continue current medication regimen for spasm  4. Paraplegia (HCC) ***  5. Infection due to urethral catheter (HCC) ***  6. Injury of thoracic spinal cord, sequela (HCC) ***   No follow-ups on file.      The patient was advised to call back or seek an in-person evaluation if the symptoms worsen or if the condition fails to improve as anticipated.  I discussed the assessment and treatment plan with the patient. The patient was provided an opportunity to ask questions and all were answered. The patient agreed with the plan and demonstrated an understanding of the instructions.  The entirety of the information documented in the History of Present Illness, Review of Systems and Physical Exam were personally obtained by me. Portions of this information were initially documented by the CMA and reviewed by me for thoroughness and accuracy.  Portions of this note were created using dictation software and may contain typographical errors.     Justin Lat, PA-C  [facility] [phone]  (phone) [phone] (fax)  [facility]

## 2022-04-17 ENCOUNTER — Telehealth: Payer: Self-pay

## 2022-04-17 NOTE — Telephone Encounter (Signed)
Copied from CRM [id number]. Topic: General - Other >> Apr 17, 2022 10:46 AM Shiquita J wrote: Reason for CRM: pt called in to be advised. Pt says that he had a visit yesterday and had to provide a urine sample. Pt says that he had been taking a antibiotic and feels that his urine spec is compromised. Pt would like to bring in another urine sample if possible? Pt says that he has a sterile cup.    CB: [phone] [phone]- please advise.

## 2022-04-18 LAB — URINALYSIS, MICROSCOPIC ONLY
Bacteria, UA: NONE SEEN
Casts: NONE SEEN /lpf
Epithelial Cells (non renal): NONE SEEN /hpf (ref 0–10)
WBC, UA: NONE SEEN /hpf (ref 0–5)

## 2022-04-18 MED ORDER — LIDOCAINE 4 % EX CREA
1.0000 | TOPICAL_CREAM | Freq: Three times a day (TID) | CUTANEOUS | 5 refills | Status: DC
Start: 2022-04-18 — End: 2023-01-03

## 2022-04-19 LAB — URINE CULTURE: Organism ID, Bacteria: NO GROWTH

## 2022-04-19 NOTE — Telephone Encounter (Signed)
Pt informed.  He is seeing Dr. [name] tomorrow.  States he is still having the urethra burning and requesting topical numbing cream called LMX lidocaine 4%.

## 2022-04-19 NOTE — Progress Notes (Signed)
Justin [name], your labs are back. As we expected, urine culture showed no growth

## 2022-04-19 NOTE — Telephone Encounter (Signed)
LM informing pt.

## 2022-04-20 ENCOUNTER — Encounter: Payer: Self-pay | Admitting: Family Medicine

## 2022-04-20 ENCOUNTER — Ambulatory Visit (INDEPENDENT_AMBULATORY_CARE_PROVIDER_SITE_OTHER): Payer: Medicare HMO | Admitting: Family Medicine

## 2022-04-20 VITALS — BP 137/82 | HR 100 | Temp 98.4°F | Wt 160.0 lb

## 2022-04-20 DIAGNOSIS — M62838 Other muscle spasm: Secondary | ICD-10-CM

## 2022-04-20 DIAGNOSIS — K6289 Other specified diseases of anus and rectum: Secondary | ICD-10-CM | POA: Diagnosis not present

## 2022-04-20 DIAGNOSIS — B372 Candidiasis of skin and nail: Secondary | ICD-10-CM

## 2022-04-20 DIAGNOSIS — M791 Myalgia, unspecified site: Secondary | ICD-10-CM | POA: Diagnosis not present

## 2022-04-20 DIAGNOSIS — R102 Pelvic and perineal pain: Secondary | ICD-10-CM | POA: Diagnosis not present

## 2022-04-20 DIAGNOSIS — R300 Dysuria: Secondary | ICD-10-CM

## 2022-04-20 MED ORDER — KETOCONAZOLE 2 % EX CREA: 1.0000 | TOPICAL_CREAM | Freq: Two times a day (BID) | CUTANEOUS | 0 refills | Status: DC

## 2022-04-20 MED ORDER — FLUCONAZOLE 150 MG PO TABS: 150.0000 mg | ORAL_TABLET | Freq: Every day | ORAL | 0 refills | Status: AC

## 2022-04-20 NOTE — Progress Notes (Signed)
Established patient visit   Patient: Justin [name]   DOB: [birth year]   41 y.o. Male  MRN: [id number] Visit Date: 04/20/2022  Today's healthcare provider: Mila Merry, MD   No chief complaint on file.  Subjective    HPI  He was seen on 04/16/2022 with dysuria and prescribed cephalexin, however urine culture have since been completed and are negative.  He states that he has been having severe spasm in his right anterior thigh for a week or so and that leg spasms are always an indication of 'something wrong with my body'. Is taking Baclofen tizanidine and all of his chronic pain medications as usual, but are not helping with leg spasm.  He has also note several red patchy areas around buttocks, groin and lower trunk for a few weeks. Also having pain in rectum like something in there with spikes. No blood in stool are other changes in bowel movement.   Medications: Outpatient Medications Prior to Visit  Medication Sig   albuterol (VENTOLIN HFA) 108 (90 Base) MCG/ACT inhaler Inhale 2 puffs into the lungs every 6 (six) hours as needed for wheezing or shortness of breath.   alprazolam (XANAX) 2 MG tablet TAKE ONE TABLET EVERY SIX HOURS IF NEEDED   amphetamine-dextroamphetamine (ADDERALL) 15 MG tablet Take 1 tablet by mouth 2 (two) times daily.   azelastine (OPTIVAR) 0.05 % ophthalmic solution Place 1 drop into both eyes 2 (two) times daily.   baclofen (LIORESAL) 20 MG tablet TAKE ONE TABLET FOUR TIMES DAILY   clotrimazole (LOTRIMIN) 1 % cream Apply 1 application topically 2 (two) times daily.   clotrimazole-betamethasone (LOTRISONE) cream Apply 1 application topically 2 (two) times daily.   diphenhydrAMINE (BENADRYL) 25 MG tablet Take 1 tablet by mouth as needed.   docusate sodium (COLACE) 250 MG capsule Take 1 capsule (250 mg total) by mouth 4 (four) times daily as needed.   dronabinol (MARINOL) 10 MG capsule Take 1 capsule (10 mg total) by mouth every 6 (six) hours as  needed.   fesoterodine (TOVIAZ) 8 MG TB24 tablet Take 1 tablet (8 mg total) by mouth daily.   flavoxATE (URISPAS) 100 MG tablet Take 1 tablet (100 mg total) by mouth 3 (three) times daily as needed for bladder spasms.   fluticasone (FLONASE) 50 MCG/ACT nasal spray Place 2 sprays into both nostrils daily.   gabapentin (NEURONTIN) 800 MG tablet Take 1 tablet (800 mg total) by mouth 4 (four) times daily.   levothyroxine (SYNTHROID) 88 MCG tablet Take 1 tablet (88 mcg total) by mouth daily.   lidocaine (LMX) 4 % cream Apply 1 Application topically 3 (three) times daily.   linaclotide (LINZESS) 145 MCG CAPS capsule Take 1 capsule (145 mcg total) by mouth daily before breakfast.   methadone (DOLOPHINE) 10 MG tablet Take 2 tablets (20 mg total) by mouth every 6 (six) hours.   Mineral Oil OIL daily.   naproxen sodium (ANAPROX DS) 550 MG tablet Take 1 tablet (550 mg total) by mouth 2 (two) times daily with a meal.   omeprazole (PRILOSEC) 20 MG capsule TAKE 1 CAPSULE BY MOUTH ONCE DAILY   oxybutynin (DITROPAN) 5 MG tablet Take 5 mg by mouth daily as needed.   oxyCODONE (ROXICODONE) 30 MG immediate release tablet Take 0.5 tablets (15 mg total) by mouth every 6 (six) hours as needed. for pain   phenazopyridine (PYRIDIUM) 200 MG tablet Take 1 tablet (200 mg total) by mouth 3 (three) times daily.   polyethylene  glycol powder (QC NATURA-LAX) 17 GM/SCOOP powder MIX 1 CAPFUL IN 8 0Z OF WATER OR JUICE AND DRINK ONCE A DAY   promethazine (PHENERGAN) 25 MG suppository Place 1 suppository (25 mg total) rectally every 6 (six) hours as needed for nausea or vomiting.   promethazine (PHENERGAN) 25 MG tablet TAKE 1 TABLET BY MOUTH EVERY 4 HOURS AS NEEDED FOR NAUSEA/VOMITING   sildenafil (REVATIO) 20 MG tablet Take 1 tablet (20 mg total) by mouth as needed.   sildenafil (REVATIO) 20 MG tablet Take 3 to 5 tablets two hours before intercouse on an empty stomach.  Do not take with nitrates.   silver sulfADIAZINE (SSD) 1 %  cream APPLY TO AFFECTED AREAS TOPICALLY TWICE DAILY   tiZANidine (ZANAFLEX) 4 MG tablet TAKE 2 TABLETS BY MOUTH 4 TIMES DAILY   Facility-Administered Medications Prior to Visit  Medication Dose Route Frequency Provider   methylPREDNISolone acetate (DEPO-MEDROL) injection 40 mg  40 mg Intramuscular Once Malva Limes, MD    Review of Systems  Constitutional:  Negative for appetite change, chills and fever.  Respiratory:  Negative for chest tightness, shortness of breath and wheezing.   Cardiovascular:  Negative for chest pain and palpitations.  Gastrointestinal:  Negative for abdominal pain, nausea and vomiting.       Objective    There were no vitals taken for this visit.   Physical Exam  Patchy areas of erythema around buttocks, lower trunk and inguinal area as per HPI. No open wounds. Also with diffuse papular eruption of upper arms. No anal or rectal lesions appreciated. No gross deformities, normal tone, and no tenderness of right upper leg. No varicosities seen.   No results found for any visits on 04/20/22.  Assessment & Plan     1. Muscle spasm of right lower extremity Previously associated with UTIs, but no improvement since starting on cephalexin and cultures have since been completed and are negative.  Continue current medications.   Check: - CBC - Comprehensive metabolic panel - TSH - Magnesium - CK; Future  2. Yeast dermatitis  - fluconazole (DIFLUCAN) 150 MG tablet; Take 1 tablet (150 mg total) by mouth daily for 1 day.  Dispense: 1 tablet; Refill: 0 - ketoconazole (NIZORAL) 2 % cream; Apply 1 Application topically 2 (two) times daily.  Dispense: 60 g; Refill: 0  3. Dysuria Encouraged follow up with urology. He states Dr. Gabrielle Dare told him he needs to establish with urologist who specializes in paralytic patients. I encouraged to get back with [facility] to re-establish, or consider referral to [facility].   4. Anal or rectal pain Unclear etiology. Unclear if related  to fungal infection. See if improves with diflucan.       The entirety of the information documented in the History of Present Illness, Review of Systems and Physical Exam were personally obtained by me. Portions of this information were initially documented by the CMA and reviewed by me for thoroughness and accuracy.     Mila Merry, MD  [facility] [phone] (phone) [phone] (fax)  [facility]

## 2022-04-21 ENCOUNTER — Other Ambulatory Visit: Payer: Self-pay | Admitting: Family Medicine

## 2022-04-21 DIAGNOSIS — G894 Chronic pain syndrome: Secondary | ICD-10-CM

## 2022-04-21 DIAGNOSIS — F988 Other specified behavioral and emotional disorders with onset usually occurring in childhood and adolescence: Secondary | ICD-10-CM

## 2022-04-21 LAB — COMPREHENSIVE METABOLIC PANEL
ALT: 40 IU/L (ref 0–44)
AST: 48 IU/L — ABNORMAL HIGH (ref 0–40)
Albumin/Globulin Ratio: 2.2 (ref 1.2–2.2)
Albumin: 4.6 g/dL (ref 4.1–5.1)
Alkaline Phosphatase: 69 IU/L (ref 44–121)
BUN/Creatinine Ratio: 20 (ref 9–20)
BUN: 16 mg/dL (ref 6–24)
Bilirubin Total: 0.6 mg/dL (ref 0.0–1.2)
CO2: 21 mmol/L (ref 20–29)
Calcium: 9.2 mg/dL (ref 8.7–10.2)
Chloride: 100 mmol/L (ref 96–106)
Creatinine, Ser: 0.81 mg/dL (ref 0.76–1.27)
Globulin, Total: 2.1 g/dL (ref 1.5–4.5)
Glucose: 75 mg/dL (ref 70–99)
Potassium: 3.8 mmol/L (ref 3.5–5.2)
Sodium: 139 mmol/L (ref 134–144)
Total Protein: 6.7 g/dL (ref 6.0–8.5)
eGFR: 114 mL/min/{1.73_m2} (ref >59)

## 2022-04-21 LAB — CBC
Hematocrit: 40.1 % (ref 37.5–51.0)
Hemoglobin: 13.2 g/dL (ref 13.0–17.7)
MCH: 27.2 pg (ref 26.6–33.0)
MCHC: 32.9 g/dL (ref 31.5–35.7)
MCV: 83 fL (ref 79–97)
Platelets: 143 10*3/uL — ABNORMAL LOW (ref 150–450)
RBC: 4.85 x10E6/uL (ref 4.14–5.80)
RDW: 12.7 % (ref 11.6–15.4)
WBC: 8.3 10*3/uL (ref 3.4–10.8)

## 2022-04-21 LAB — MAGNESIUM: Magnesium: 2 mg/dL (ref 1.6–2.3)

## 2022-04-21 LAB — TSH: TSH: 3.16 u[IU]/mL (ref 0.450–4.500)

## 2022-04-23 ENCOUNTER — Telehealth (INDEPENDENT_AMBULATORY_CARE_PROVIDER_SITE_OTHER): Payer: Medicare HMO | Admitting: Family Medicine

## 2022-04-23 DIAGNOSIS — R252 Cramp and spasm: Secondary | ICD-10-CM

## 2022-04-23 DIAGNOSIS — R102 Pelvic and perineal pain: Secondary | ICD-10-CM | POA: Diagnosis not present

## 2022-04-23 NOTE — Progress Notes (Signed)
I,Jana Kyliyah Stirn,acting as a Education administrator for Lelon Huh, MD.,have documented all relevant documentation on the behalf of Lelon Huh, MD,as directed by  Lelon Huh, MD while in the presence of Lelon Huh, MD.  MyChart Video Visit    Virtual Visit via Video Note   This visit type was conducted due to national recommendations for restrictions regarding the COVID-19 Pandemic (e.g. social distancing) in an effort to limit this patient's exposure and mitigate transmission in our community. This patient is at least at moderate risk for complications without adequate follow up. This format is felt to be most appropriate for this patient at this time. Physical exam was limited by quality of the video and audio technology used for the visit.   Patient location: home  Provider location: [facility]   I discussed the limitations of evaluation and management by telemedicine and the availability of in person appointments. The patient expressed understanding and agreed to proceed.  Patient: Justin [name]   DOB: [birth year]   41 y.o. Male  MRN: [id number] Visit Date: 04/23/2022  Today's healthcare provider: Lelon Huh, MD   No chief complaint on file.  Subjective    Patient states the urinary pain and spasms continues.  States pain in rear end to bladder and penis is getting worse and worse, along with severe spasms in legs, feels like a burn in back.   He was seen for same on 04-17-2022, had labs ordered including CBC, Met C and magnesium which were all normal. He did have rash at time suspected to be tinea and was prescribed diflucan. He was also seen on 04-16-2022 with dysuria but urinalysis and cultures were negative.     Medications: Outpatient Medications Prior to Visit  Medication Sig   albuterol (VENTOLIN HFA) 108 (90 Base) MCG/ACT inhaler Inhale 2 puffs into the lungs every 6 (six) hours as needed for wheezing or shortness of breath.   alprazolam (XANAX)  2 MG tablet TAKE ONE TABLET EVERY SIX HOURS IF NEEDED   amphetamine-dextroamphetamine (ADDERALL) 15 MG tablet Take 1 tablet by mouth 2 (two) times daily.   azelastine (OPTIVAR) 0.05 % ophthalmic solution Place 1 drop into both eyes 2 (two) times daily.   baclofen (LIORESAL) 20 MG tablet TAKE ONE TABLET FOUR TIMES DAILY   clotrimazole (LOTRIMIN) 1 % cream Apply 1 application topically 2 (two) times daily.   clotrimazole-betamethasone (LOTRISONE) cream Apply 1 application topically 2 (two) times daily.   diphenhydrAMINE (BENADRYL) 25 MG tablet Take 1 tablet by mouth as needed.   docusate sodium (COLACE) 250 MG capsule Take 1 capsule (250 mg total) by mouth 4 (four) times daily as needed.   dronabinol (MARINOL) 10 MG capsule Take 1 capsule (10 mg total) by mouth every 6 (six) hours as needed.   fesoterodine (TOVIAZ) 8 MG TB24 tablet Take 1 tablet (8 mg total) by mouth daily.   flavoxATE (URISPAS) 100 MG tablet Take 1 tablet (100 mg total) by mouth 3 (three) times daily as needed for bladder spasms.   fluticasone (FLONASE) 50 MCG/ACT nasal spray Place 2 sprays into both nostrils daily.   gabapentin (NEURONTIN) 800 MG tablet Take 1 tablet (800 mg total) by mouth 4 (four) times daily.   ketoconazole (NIZORAL) 2 % cream Apply 1 Application topically 2 (two) times daily.   levothyroxine (SYNTHROID) 88 MCG tablet Take 1 tablet (88 mcg total) by mouth daily.   lidocaine (LMX) 4 % cream Apply 1 Application topically 3 (three) times daily.  linaclotide (LINZESS) 145 MCG CAPS capsule Take 1 capsule (145 mcg total) by mouth daily before breakfast.   methadone (DOLOPHINE) 10 MG tablet Take 2 tablets (20 mg total) by mouth every 6 (six) hours.   Mineral Oil OIL daily.   naproxen sodium (ANAPROX DS) 550 MG tablet Take 1 tablet (550 mg total) by mouth 2 (two) times daily with a meal.   omeprazole (PRILOSEC) 20 MG capsule TAKE 1 CAPSULE BY MOUTH ONCE DAILY   oxybutynin (DITROPAN) 5 MG tablet Take 5 mg by mouth  daily as needed.   oxyCODONE (ROXICODONE) 30 MG immediate release tablet Take 0.5 tablets (15 mg total) by mouth every 6 (six) hours as needed. for pain   phenazopyridine (PYRIDIUM) 200 MG tablet Take 1 tablet (200 mg total) by mouth 3 (three) times daily.   polyethylene glycol powder (QC NATURA-LAX) 17 GM/SCOOP powder MIX 1 CAPFUL IN 8 0Z OF WATER OR JUICE AND DRINK ONCE A DAY   promethazine (PHENERGAN) 25 MG suppository Place 1 suppository (25 mg total) rectally every 6 (six) hours as needed for nausea or vomiting.   promethazine (PHENERGAN) 25 MG tablet TAKE 1 TABLET BY MOUTH EVERY 4 HOURS AS NEEDED FOR NAUSEA/VOMITING   sildenafil (REVATIO) 20 MG tablet Take 1 tablet (20 mg total) by mouth as needed.   sildenafil (REVATIO) 20 MG tablet Take 3 to 5 tablets two hours before intercouse on an empty stomach.  Do not take with nitrates.   silver sulfADIAZINE (SSD) 1 % cream APPLY TO AFFECTED AREAS TOPICALLY TWICE DAILY   tiZANidine (ZANAFLEX) 4 MG tablet TAKE 2 TABLETS BY MOUTH 4 TIMES DAILY   Facility-Administered Medications Prior to Visit  Medication Dose Route Frequency Provider   methylPREDNISolone acetate (DEPO-MEDROL) injection 40 mg  40 mg Intramuscular Once Birdie Sons, MD    Review of Systems     Objective    There were no vitals taken for this visit.     Physical Exam   Awake, alert, oriented x 3. Appears uncomfortable.   Assessment & Plan     1. Pelvic pain   2. Spasticity  Labs from 04-19-2022 not diagnostic. Has worsened over the weekend. He needs lower abdominal and pelvic imaging. Considering severity of his pain, agree that he should go to nearest ER for further evaluation.       I discussed the assessment and treatment plan with the patient. The patient was provided an opportunity to ask questions and all were answered. The patient agreed with the plan and demonstrated an understanding of the instructions.   The patient was advised to call back or seek  an in-person evaluation if the symptoms worsen or if the condition fails to improve as anticipated.  I provided 9 minutes of non-face-to-face time during this encounter.  The entirety of the information documented in the History of Present Illness, Review of Systems and Physical Exam were personally obtained by me. Portions of this information were initially documented by the CMA and reviewed by me for thoroughness and accuracy.    Lelon Huh, MD [facility] [phone] (phone) [phone] (fax)  [place]

## 2022-04-24 ENCOUNTER — Other Ambulatory Visit: Payer: Self-pay | Admitting: Family Medicine

## 2022-04-24 ENCOUNTER — Other Ambulatory Visit: Payer: Self-pay | Admitting: Physician Assistant

## 2022-04-24 ENCOUNTER — Telehealth: Payer: Self-pay | Admitting: Family Medicine

## 2022-04-24 DIAGNOSIS — F988 Other specified behavioral and emotional disorders with onset usually occurring in childhood and adolescence: Secondary | ICD-10-CM

## 2022-04-24 DIAGNOSIS — M25559 Pain in unspecified hip: Secondary | ICD-10-CM

## 2022-04-24 DIAGNOSIS — G894 Chronic pain syndrome: Secondary | ICD-10-CM

## 2022-04-24 DIAGNOSIS — R102 Pelvic and perineal pain: Secondary | ICD-10-CM

## 2022-04-24 DIAGNOSIS — B372 Candidiasis of skin and nail: Secondary | ICD-10-CM

## 2022-04-24 DIAGNOSIS — M62838 Other muscle spasm: Secondary | ICD-10-CM

## 2022-04-24 DIAGNOSIS — K6289 Other specified diseases of anus and rectum: Secondary | ICD-10-CM

## 2022-04-24 NOTE — Telephone Encounter (Signed)
Please advise 

## 2022-04-24 NOTE — Telephone Encounter (Signed)
Patient states he had a vv w/ provider on 8-7  Patient states he did not go to the ed as recommended and requesting another rx for diflucan   Patient also requesting lower abdominal and pelvic imaging  Patient states pharmacy gave him lidocaine Aspercreme instead of  lidocaine (LMX) 4 % cream patient states he needs another rx sent in   Pharmacy  [facility] - [place], [place] - [id number] Phone:  [phone]  Fax:  [phone]      Please fu w/ patient

## 2022-04-24 NOTE — Telephone Encounter (Signed)
Requested medication (s) are due for refill today - provider review   Requested medication (s) are on the active medication list -yes  Future visit scheduled -yes  Last refill: 04/20/22 60g  Notes to clinic: non delegated Rx  Requested Prescriptions  Pending Prescriptions Disp Refills   ketoconazole (NIZORAL) 2 % cream [Pharmacy Med Name: KETOCONAZOLE 2% TOP CREAM GM] 60 g 0    Sig: APPLY 1 APPLICATION TOPICALLY TWICE DAILY     Not Delegated - Over the Counter: OTC 2 Failed - 04/24/2022  9:36 AM      Failed - This refill cannot be delegated      Passed - Valid encounter within last 12 months    Recent Outpatient Visits           Yesterday Pelvic pain   [facility] Malva Limes, MD   4 days ago Muscle spasm of right lower extremity   [facility] Malva Limes, MD   1 week ago Chronic UTI   [facility], [place], PA-C   1 week ago Paraplegia [facility])   [facility], [place], PA-C   1 month ago Recurrent UTI   [facility] [place], [place], PA-C       Future Appointments             In 1 week Richardo Hanks, Laurette Schimke, MD [facility] [place]   In 2 weeks [name], Demetrios Isaacs, MD [facility]   In 5 months Deirdre Evener, MD [facility]               Requested Prescriptions  Pending Prescriptions Disp Refills   ketoconazole (NIZORAL) 2 % cream [Pharmacy Med Name: KETOCONAZOLE 2% TOP CREAM GM] 60 g 0    Sig: APPLY 1 APPLICATION TOPICALLY TWICE DAILY     Not Delegated - Over the Counter: OTC 2 Failed - 04/24/2022  9:36 AM      Failed - This refill cannot be delegated      Passed - Valid encounter within last 12 months    Recent Outpatient Visits           Yesterday Pelvic pain   [facility] Malva Limes, MD   4 days ago Muscle spasm of right lower extremity   [facility] Malva Limes, MD   1 week ago  Chronic UTI   [facility] [place], [place], PA-C   1 week ago Paraplegia [facility])   [facility] [place], [place], PA-C   1 month ago Recurrent UTI   [facility] [place], [place], PA-C       Future Appointments             In 1 week Richardo Hanks, Laurette Schimke, MD [facility] [place]   In 2 weeks [name], Demetrios Isaacs, MD [facility]   In 5 months Deirdre Evener, MD [facility]

## 2022-04-25 ENCOUNTER — Telehealth: Payer: Self-pay | Admitting: *Deleted

## 2022-04-25 DIAGNOSIS — R339 Retention of urine, unspecified: Secondary | ICD-10-CM | POA: Diagnosis not present

## 2022-04-25 MED ORDER — AMPHETAMINE-DEXTROAMPHETAMINE 15 MG PO TABS: 1.0000 | ORAL_TABLET | Freq: Two times a day (BID) | ORAL | 0 refills | Status: DC

## 2022-04-25 MED ORDER — METHADONE HCL 10 MG PO TABS
20.0000 mg | ORAL_TABLET | Freq: Four times a day (QID) | ORAL | 0 refills | Status: DC
Start: 2022-04-25 — End: 2022-05-25

## 2022-04-25 MED ORDER — DIPHENHYDRAMINE HCL 25 MG PO TABS
25.0000 mg | ORAL_TABLET | ORAL | 3 refills | Status: DC | PRN
Start: 2022-04-25 — End: 2022-05-01

## 2022-04-25 NOTE — Telephone Encounter (Signed)
Requested medications are due for refill today.  A little too soon  Requested medications are on the active medications list.  yes  Last refill. 11/29/2021 #30 5 refills  Future visit scheduled.   yes  Notes to clinic.  Failed protocol d/t abnormal lab    Requested Prescriptions  Pending Prescriptions Disp Refills   fesoterodine (TOVIAZ) 8 MG TB24 tablet [Pharmacy Med Name: FESOTERODINE FUMARATE ER 8 MG TAB] 30 tablet 5    Sig: TAKE 1 TABLET BY MOUTH DAILY     Urology:  Bladder Agents 2 Failed - 04/24/2022  3:44 PM      Failed - AST in normal range and within 360 days    AST  Date Value Ref Range Status  04/20/2022 48 (H) 0 - 40 IU/L Final   SGOT(AST)  Date Value Ref Range Status  06/25/2014 15 15 - 37 Unit/L Final         Passed - Cr in normal range and within 360 days    Creatinine  Date Value Ref Range Status  06/28/2014 0.82 0.60 - 1.30 mg/dL Final   Creatinine, Ser  Date Value Ref Range Status  04/20/2022 0.81 0.76 - 1.27 mg/dL Final         Passed - ALT in normal range and within 360 days    ALT  Date Value Ref Range Status  04/20/2022 40 0 - 44 IU/L Final   SGPT (ALT)  Date Value Ref Range Status  06/25/2014 18 U/L Final    Comment:    14-63 NOTE: New Reference Range 04/06/14          Passed - Valid encounter within last 12 months    Recent Outpatient Visits           2 days ago Pelvic pain   [facility] Malva Limes, MD   5 days ago Muscle spasm of right lower extremity   [facility] Malva Limes, MD   1 week ago Chronic UTI   [facility] [place], [place], PA-C   1 week ago Paraplegia [facility])   [facility] [place], [place], PA-C   1 month ago Recurrent UTI   [facility], [place], PA-C       Future Appointments             In 6 days Richardo Hanks, Laurette Schimke, MD [facility] [place]   In 1 week [name], Demetrios Isaacs, MD [facility], [facility]   In  5 months Deirdre Evener, MD [facility]            Signed Prescriptions Disp Refills   promethazine (PHENERGAN) 25 MG tablet 30 tablet 2    Sig: TAKE 1 TABLET BY MOUTH EVERY 4 HOURS AS NEEDED FOR NAUSEA AND VOMITING     Not Delegated - Gastroenterology: Antiemetics Failed - 04/24/2022  3:44 PM      Failed - This refill cannot be delegated      Passed - Valid encounter within last 6 months    Recent Outpatient Visits           2 days ago Pelvic pain   [facility] Malva Limes, MD   5 days ago Muscle spasm of right lower extremity   [facility] Malva Limes, MD   1 week ago Chronic UTI   [facility] [place], [place], PA-C   1 week ago Paraplegia [facility])   [facility]  Debera Lat, PA-C   1 month ago Recurrent UTI   [facility] [place], [place], PA-C       Future Appointments             In 6 days Richardo Hanks, Laurette Schimke, MD [facility] [place]   In 1 week [name], Demetrios Isaacs, MD [facility], [facility]   In 5 months Deirdre Evener, MD [facility]            Refused Prescriptions Disp Refills   amphetamine-dextroamphetamine (ADDERALL) 15 MG tablet [Pharmacy Med Name: AMPHETAMINE-DEXTROAMPHETAMINE 15 MG] 60 tablet     Sig: TAKE ONE (1) TABLET BY MOUTH TWO TIMES PER DAY     Not Delegated - Psychiatry:  Stimulants/ADHD Failed - 04/24/2022  3:44 PM      Failed - This refill cannot be delegated      Failed - Urine Drug Screen completed in last 360 days      Passed - Last BP in normal range    BP Readings from Last 1 Encounters:  04/20/22 137/82         Passed - Last Heart Rate in normal range    Pulse Readings from Last 1 Encounters:  04/20/22 100         Passed - Valid encounter within last 6 months    Recent Outpatient Visits           2 days ago Pelvic pain   [facility] Malva Limes, MD   5 days ago Muscle spasm of right  lower extremity   [facility] Malva Limes, MD   1 week ago Chronic UTI   [facility] [place], [place], PA-C   1 week ago Paraplegia [facility])   [facility] [place], [place], PA-C   1 month ago Recurrent UTI   [facility], [place], PA-C       Future Appointments             In 6 days Sondra Come, MD [place] [facility] [place]   In 1 week [name], Demetrios Isaacs, MD [facility], [facility]   In 5 months Deirdre Evener, MD [facility]             methadone (DOLOPHINE) 10 MG tablet [Pharmacy Med Name: METHADONE HCL 10 MG TAB] 240 tablet     Sig: TAKE 2 TABLETS BY MOUTH EVERY 6 HOURS     Not Delegated - Analgesics:  Opioid Agonists - methadone hydrochloride Failed - 04/24/2022  3:44 PM      Failed - This refill cannot be delegated      Failed - AST in normal range and within 360 days    AST  Date Value Ref Range Status  04/20/2022 48 (H) 0 - 40 IU/L Final   SGOT(AST)  Date Value Ref Range Status  06/25/2014 15 15 - 37 Unit/L Final         Failed - Urine Drug Screen completed in last 360 days      Passed - Cr in normal range and within 360 days    Creatinine  Date Value Ref Range Status  06/28/2014 0.82 0.60 - 1.30 mg/dL Final   Creatinine, Ser  Date Value Ref Range Status  04/20/2022 0.81 0.76 - 1.27 mg/dL Final         Passed - ALT in normal range and within 360 days    ALT  Date Value Ref  Range Status  04/20/2022 40 0 - 44 IU/L Final   SGPT (ALT)  Date Value Ref Range Status  06/25/2014 18 U/L Final    Comment:    14-63 NOTE: New Reference Range 04/06/14          Passed - Valid encounter within last 3 months    Recent Outpatient Visits           2 days ago Pelvic pain   [facility] Malva Limes, MD   5 days ago Muscle spasm of right lower extremity   [facility] Malva Limes, MD   1 week ago Chronic UTI   [facility], [place], PA-C   1 week ago Paraplegia [facility])   [facility] [place], [place], PA-C   1 month ago Recurrent UTI   [facility] [place], [place], PA-C       Future Appointments             In 6 days Richardo Hanks, Laurette Schimke, MD [facility] [place]   In 1 week [name], Demetrios Isaacs, MD [facility], [facility]   In 5 months Deirdre Evener, MD [facility]             b

## 2022-04-25 NOTE — Chronic Care Management (AMB) (Signed)
  Chronic Care Management   Note  04/25/2022 Name: Justin [name] MRN: [id number] DOB: 18-May-1981  Justin [name] is a 41 y.o. year old male who is a primary care patient of Caryn Section, Kirstie Peri, MD. I reached out to Marlowe Kays by phone today in response to a referral sent by Justin [name] PCP.  Justin [name] was given information about Chronic Care Management services today including:  CCM service includes personalized support from designated clinical staff supervised by his physician, including individualized plan of care and coordination with other care providers 24/7 contact phone numbers for assistance for urgent and routine care needs. Service will only be billed when office clinical staff spend 20 minutes or more in a month to coordinate care. Only one practitioner may furnish and bill the service in a calendar month. The patient may stop CCM services at any time (effective at the end of the month) by phone call to the office staff. The patient is responsible for co-pay (up to 20% after annual deductible is met) if co-pay is required by the individual health plan.   Patient agreed to services and verbal consent obtained.   Follow up plan: Telephone appointment with care management team member scheduled for: 05/04/2022  Julian Hy, Prue Direct Dial: [phone]

## 2022-04-25 NOTE — Telephone Encounter (Signed)
Requested medications are due for refill today.  yes  Requested medications are on the active medications list.  yes  Last refill. 12/08/2021 #60   Future visit scheduled.   yes  Notes to clinic.  Failed protocol d/t abnormal lab.    Requested Prescriptions  Pending Prescriptions Disp Refills   naproxen sodium (ANAPROX) 550 MG tablet [Pharmacy Med Name: NAPROXEN SODIUM 550 MG TAB] 60 tablet 5    Sig: TAKE 1 TABLET BY MOUTH 2 TIMES DAILY WITH A MEAL.     Analgesics:  NSAIDS Failed - 04/24/2022  3:36 PM      Failed - Manual Review: Labs are only required if the patient has taken medication for more than 8 weeks.      Failed - PLT in normal range and within 360 days    Platelets  Date Value Ref Range Status  04/20/2022 143 (L) 150 - 450 x10E3/uL Final         Passed - Cr in normal range and within 360 days    Creatinine  Date Value Ref Range Status  06/28/2014 0.82 0.60 - 1.30 mg/dL Final   Creatinine, Ser  Date Value Ref Range Status  04/20/2022 0.81 0.76 - 1.27 mg/dL Final         Passed - HGB in normal range and within 360 days    Hemoglobin  Date Value Ref Range Status  04/20/2022 13.2 13.0 - 17.7 g/dL Final         Passed - HCT in normal range and within 360 days    Hematocrit  Date Value Ref Range Status  04/20/2022 40.1 37.5 - 51.0 % Final         Passed - eGFR is 30 or above and within 360 days    EGFR (African American)  Date Value Ref Range Status  06/28/2014 >60 >66m/min Final   GFR calc Af Amer  Date Value Ref Range Status  09/15/2020 130 >59 mL/min/1.73 Final    Comment:    **In accordance with recommendations from the NKF-ASN Task force,**   [facility] is in the process of updating its eGFR calculation to the   2021 CKD-EPI creatinine equation that estimates kidney function   without a race variable.    EGFR (Non-African Amer.)  Date Value Ref Range Status  06/28/2014 >60 >668mmin Final    Comment:    eGFR values <6071min/1.73 m2 may be an  indication of chronic kidney disease (CKD). Calculated eGFR, using the MRDR Study equation, is useful in  patients with stable renal function. The eGFR calculation will not be reliable in acutely ill patients when serum creatinine is changing rapidly. It is not useful in patients on dialysis. The eGFR calculation may not be applicable to patients at the low and high extremes of body sizes, pregnant women, and vegetarians.    GFR calc non Af Amer  Date Value Ref Range Status  09/15/2020 113 >59 mL/min/1.73 Final   eGFR  Date Value Ref Range Status  04/20/2022 114 >59 mL/min/1.73 Final         Passed - Patient is not pregnant      Passed - Valid encounter within last 12 months    Recent Outpatient Visits           [facility]   5 days ago Muscle spasm of right lower extremity   [facility]   1  week ago Chronic UTI   [facility] [place], [place], PA-C   1 week ago Paraplegia [facility])   [facility] [place], [place], PA-C   1 month ago Recurrent UTI   [facility] [place], [place], PA-C       Future Appointments             In 6 days Diamantina [place], Herbert Seta, MD [place]   In 1 week Caryn Section, Kirstie Peri, MD [facility] [place]   In 5 months Ralene Bathe, MD [place]

## 2022-04-26 ENCOUNTER — Telehealth: Payer: Self-pay

## 2022-04-26 NOTE — Telephone Encounter (Signed)
Copied from CRM [id number]. Topic: General - Other >> Apr 26, 2022  9:32 AM Eleonore Chiquito wrote: Reason for CRM: Per [facility] pt will need a 13 hour prep before having CT done.   Prednisone - 50 mg by mouth at 13 hours, 7 hours, and 1 hour before contrast injection, plus Diphenhydramine (Benadryl) - 50 mg by mouth 1 hour before contrast  This should be sent [facility] - [place], [place] - [address]  [address] [place], [place] [address]  Phone:  [phone] Fax:  [phone]

## 2022-04-27 ENCOUNTER — Encounter: Payer: Self-pay | Admitting: Family Medicine

## 2022-04-27 ENCOUNTER — Telehealth: Payer: Self-pay | Admitting: Family Medicine

## 2022-04-27 DIAGNOSIS — M609 Myositis, unspecified: Secondary | ICD-10-CM

## 2022-04-27 MED ORDER — PREDNISONE 50 MG PO TABS: ORAL_TABLET | ORAL | 0 refills | Status: DC

## 2022-04-27 MED ORDER — DIPHENHYDRAMINE HCL 50 MG PO CAPS
ORAL_CAPSULE | ORAL | 0 refills | Status: DC
Start: 2022-04-27 — End: 2022-05-21

## 2022-04-27 NOTE — Telephone Encounter (Signed)
Please Review. Patient phone is not working asked if he can be responded by via mychart please.

## 2022-04-27 NOTE — Telephone Encounter (Signed)
Pt called asking about some abnormal results on his last labs.  He would like someone to call him back asap.  CB@ [phone]

## 2022-04-27 NOTE — Addendum Note (Signed)
Addended by: Malva Limes on: 04/27/2022 05:22 PM   Modules accepted: Orders

## 2022-04-30 ENCOUNTER — Other Ambulatory Visit: Payer: Self-pay

## 2022-04-30 ENCOUNTER — Encounter: Payer: Self-pay | Admitting: Family Medicine

## 2022-04-30 ENCOUNTER — Telehealth: Payer: Self-pay | Admitting: Family Medicine

## 2022-04-30 DIAGNOSIS — G894 Chronic pain syndrome: Secondary | ICD-10-CM

## 2022-04-30 NOTE — Telephone Encounter (Signed)
Patient called Friday to discuss abnormal lab results (CK) He is wanting someone to send him a mychart message TODAY regarding this please.

## 2022-04-30 NOTE — Telephone Encounter (Signed)
CK is a muscle enzyme that is elevated when muscles have been overactive or under strain, such as muscle spasms. We do need to recheck this level along with a renal panel and ANA to make sure the CK is not affecting his kidneys and that there is no auto immune disorder causing it. .  Can he stop by the lab today or tomorrow to check the labs. Would like to have renal panel back before his CT scan on Wednesday.

## 2022-04-30 NOTE — Telephone Encounter (Signed)
Last refill: 04/03/2022 #60 with 0 refills Last office visit: 04/23/2022 (video visit) Next office visit: 05/08/2022  Patient would like a refill. He says he has had more discomfort due to bladder spasms. He would like a refill as soon as he is due for it, or a couple of days sooner if possible.

## 2022-04-30 NOTE — Telephone Encounter (Signed)
Mychart message sent to patient per his request.

## 2022-04-30 NOTE — Telephone Encounter (Signed)
Please advise on elevated CK levels.

## 2022-04-30 NOTE — Telephone Encounter (Signed)
Pt is still waiting on a call or mychart response about his CK levels/ please advise / pt stated you can also speak to his wife about it as well

## 2022-04-30 NOTE — Telephone Encounter (Signed)
Patient has read message sent via mychart. I called patient to confirm that he received and understood the message. Patient plans to have labs done tomorrow. He also mentioned that he needs to reschedule the CT appointment on Wednesday. I advised patient that he needs to call [facility] scheduling to reschedule that appointment. Patient agreed to call them to reschedule CT appointment.

## 2022-04-30 NOTE — Telephone Encounter (Signed)
Message sent to patient via mychart

## 2022-05-01 ENCOUNTER — Telehealth: Payer: Self-pay

## 2022-05-01 ENCOUNTER — Encounter: Payer: Self-pay | Admitting: Urology

## 2022-05-01 ENCOUNTER — Ambulatory Visit (INDEPENDENT_AMBULATORY_CARE_PROVIDER_SITE_OTHER): Payer: Medicare HMO | Admitting: Urology

## 2022-05-01 ENCOUNTER — Other Ambulatory Visit: Admission: RE | Admit: 2022-05-01 | Payer: Medicare HMO | Source: Home / Self Care

## 2022-05-01 VITALS — BP 106/67 | HR 114 | Wt 160.0 lb

## 2022-05-01 DIAGNOSIS — N319 Neuromuscular dysfunction of bladder, unspecified: Secondary | ICD-10-CM

## 2022-05-01 DIAGNOSIS — N390 Urinary tract infection, site not specified: Secondary | ICD-10-CM

## 2022-05-01 NOTE — Telephone Encounter (Signed)
Called pt per Dr. Richardo Hanks, no answer. Left message for pt informing him that Dr. Richardo Hanks is willing to see him today in clinic for an acute issue, however if he is seeking long term chronic care he will need to resume care at [facility] due to his complex urologic history.

## 2022-05-01 NOTE — Progress Notes (Signed)
   05/01/2022 3:14 PM   [name] 08-30-1981 [id number]  Reason for visit: Neurogenic bladder, possible UTI  HPI: Complex 41 year old male with history of spinal cord injury as a teenager with resulting neurogenic bladder that has been managed with CIC 4-6 times daily for the last 20 years.  He was previously followed by [facility] but had not been seen by them since 2016, I last saw him in September 2021 and recommended reestablishing care with [facility] with his complex issues and stress incontinence in between catheterizations.  From chart review, he had a phone visit with him in January 2022 and they recommended follow-up with [name] to consider urodynamics and other treatment options, but he never followed up.  Today he has a number of complaints, and history is very challenging to elucidate.  Essentially it sounds like he was having some significant bladder spasms that he attributed to a UTI and had trouble passing a catheter and had a traumatic catheterization with some bleeding and difficulty passing a catheter.  He then placed a 14 French Foley for 1 week for bladder rest.  He also took 1 week of Cipro that he had at home from a prior prescription.  He gave a urine sample to PCP on 04/16/2022 that was completely benign and showed no growth.  Unclear the timing of that urine sample compared to antibiotic start date.  He then removed his Foley at home earlier today, and has not catheterized since that time.  It sounds like he is occasionally having some burning discomfort and some lower extremity spasms of unclear etiology, and some potentially low-grade fevers at home.  We reviewed possible etiologies at length including UTI, prostatitis, urethral injury.  I recommended sending a urinalysis today and culture to evaluate for any residual infection.  I again strongly encouraged him to follow-up at [facility] for his neurogenic bladder.  We will call with urine culture results.   Sondra Come, MD  [facility] [address], [address] [place], [place] [address] [phone]

## 2022-05-02 ENCOUNTER — Telehealth: Payer: Self-pay

## 2022-05-02 ENCOUNTER — Ambulatory Visit: Payer: Medicare HMO

## 2022-05-02 NOTE — Progress Notes (Signed)
Chronic Care Management Pharmacy Assistant   Name: Trong Gosling  MRN: [id number] DOB: 08-22-1981  Chart Review for the clinical pharmacist for 05/04/2022 at 9:00 am.  Conditions to be addressed/monitored: Anxiety, Depression, GERD, Hypothyroidism, and Paraplegia, ADD, Chronic pain associated with significant psychosocial dysfunction, Bladder neurogenesis.   Primary concerns for visit include: None ID   Recent office visits:  04/26/2022 Dr. [name] MD (PCP) start predniSONE (DELTASONE) 50 MG tablet, diphenhydramine 50 mg  04/23/2022 Dr. [name] MD (PCP) No Medication Changes noted 04/20/2022 Dr. [name] MD (PCP)  Start fluconazole (DIFLUCAN) 150 MG tablet, start ketoconazole 2% 04/16/2022 Debera Lat PA-C (PCP Office) Stopped current antibiotic/cipro, Referred to pain clinic, Referred to neurology 04/12/2022 Debera Lat PA-C (PCP Office)  No Medication Changes noted, [facility]B [facility] 03/19/2022 Debera Lat PA-C (PCP Office) Start cephALEXin (KEFLEX) 500 MG capsule 02/16/2022 Debera Lat PA-C (PCP Office) No Medication Changes noted 01/14/2022 Dr. [name] MD (PCP) start Cefdinir 300 mg for 10 days 01/10/2022 Kennedy Bucker LPN (PCP Office) Medicare Wellness completed, No medication Changes noted 01/09/2022 Dr. [name] MD (PCP) No medication Changes noted 12/19/2021 Dr. [name] MD (PCP) start amoxicillin (AMOXIL) 875 MG tablet 12/05/2021 Debera Lat PA (PCP Office) Increase naproxen sodium (ANAPROX DS) 550 MG tablet 2 times daily, start sulfamethoxazole- trimethoprim 2 times daily, Ambulatory referral to Urology.  Recent consult visits:  05/01/2022 Dr. Richardo Hanks MD (Urology) No Medication Changes noted.  11/27/2021 Geralyn Corwin DO (Wound) No Medication Changes noted 11/20/2021 Dr. Tobi Bastos MD (Gastroenterology) Start Na Sulfate-K Sulfate-Mg Sulf, Follow up in 8 to 12 weeks 11/13/2021 Allen Derry PA_C (wound) No Medication Changes noted  Hospital  visits:  None in previous 6 months  Questions for Clinical Pharmacist:   1.Are you able to connect with Patient No     Left Voice message to do initial question prior to patient appointment on 05/04/2022 for CCM at 9:00 am with Angelena Sole the Clinical pharmacist.     Medications: Outpatient Encounter Medications as of 05/02/2022  Medication Sig   albuterol (VENTOLIN HFA) 108 (90 Base) MCG/ACT inhaler Inhale 2 puffs into the lungs every 6 (six) hours as needed for wheezing or shortness of breath.   alprazolam (XANAX) 2 MG tablet TAKE ONE TABLET EVERY SIX HOURS IF NEEDED   amphetamine-dextroamphetamine (ADDERALL) 15 MG tablet Take 1 tablet by mouth 2 (two) times daily.   azelastine (OPTIVAR) 0.05 % ophthalmic solution Place 1 drop into both eyes 2 (two) times daily.   baclofen (LIORESAL) 20 MG tablet TAKE ONE TABLET FOUR TIMES DAILY   clotrimazole (LOTRIMIN) 1 % cream Apply 1 application topically 2 (two) times daily.   clotrimazole-betamethasone (LOTRISONE) cream Apply 1 application topically 2 (two) times daily.   diphenhydrAMINE (BENADRYL) 50 MG capsule Take 1 capsule 1 hour before procedure   docusate sodium (COLACE) 250 MG capsule Take 1 capsule (250 mg total) by mouth 4 (four) times daily as needed.   dronabinol (MARINOL) 10 MG capsule Take 1 capsule (10 mg total) by mouth every 6 (six) hours as needed.   fesoterodine (TOVIAZ) 8 MG TB24 tablet Take 1 tablet (8 mg total) by mouth daily.   flavoxATE (URISPAS) 100 MG tablet Take 1 tablet (100 mg total) by mouth 3 (three) times daily as needed for bladder spasms.   fluticasone (FLONASE) 50 MCG/ACT nasal spray Place 2 sprays into both nostrils daily.   gabapentin (NEURONTIN) 800 MG tablet Take 1 tablet (800 mg total) by mouth 4 (four) times daily.  ketoconazole (NIZORAL) 2 % cream Apply 1 Application topically 2 (two) times daily.   levothyroxine (SYNTHROID) 88 MCG tablet Take 1 tablet (88 mcg total) by mouth daily.   lidocaine (LMX) 4  % cream Apply 1 Application topically 3 (three) times daily.   linaclotide (LINZESS) 145 MCG CAPS capsule Take 1 capsule (145 mcg total) by mouth daily before breakfast.   methadone (DOLOPHINE) 10 MG tablet Take 2 tablets (20 mg total) by mouth every 6 (six) hours.   Mineral Oil OIL daily.   naproxen sodium (ANAPROX) 550 MG tablet TAKE 1 TABLET BY MOUTH 2 TIMES DAILY WITH A MEAL.   omeprazole (PRILOSEC) 20 MG capsule TAKE 1 CAPSULE BY MOUTH ONCE DAILY   oxybutynin (DITROPAN) 5 MG tablet Take 5 mg by mouth daily as needed.   oxyCODONE (ROXICODONE) 30 MG immediate release tablet Take 0.5 tablets (15 mg total) by mouth every 6 (six) hours as needed. for pain   phenazopyridine (PYRIDIUM) 200 MG tablet Take 1 tablet (200 mg total) by mouth 3 (three) times daily.   polyethylene glycol powder (QC NATURA-LAX) 17 GM/SCOOP powder MIX 1 CAPFUL IN 8 0Z OF WATER OR JUICE AND DRINK ONCE A DAY   predniSONE (DELTASONE) 50 MG tablet Take 1 tablet 13 hours before procedure , 1 tablet 7 hours before procedure and 1 tablet 1 hour before procedure.   promethazine (PHENERGAN) 25 MG suppository Place 1 suppository (25 mg total) rectally every 6 (six) hours as needed for nausea or vomiting.   promethazine (PHENERGAN) 25 MG tablet TAKE 1 TABLET BY MOUTH EVERY 4 HOURS AS NEEDED FOR NAUSEA AND VOMITING   sildenafil (REVATIO) 20 MG tablet Take 3 to 5 tablets two hours before intercouse on an empty stomach.  Do not take with nitrates.   silver sulfADIAZINE (SSD) 1 % cream APPLY TO AFFECTED AREAS TOPICALLY TWICE DAILY   tiZANidine (ZANAFLEX) 4 MG tablet TAKE 2 TABLETS BY MOUTH 4 TIMES DAILY   Facility-Administered Encounter Medications as of 05/02/2022  Medication   methylPREDNISolone acetate (DEPO-MEDROL) injection 40 mg    Care Gaps: COVID-10 Vaccine HIV Screening Hepatitis C Screening Influenza Vaccine  Star Rating Drugs: None ID  Everlean Cherry Clinical Pharmacist Assistant [phone]

## 2022-05-03 ENCOUNTER — Other Ambulatory Visit: Payer: Self-pay

## 2022-05-03 ENCOUNTER — Other Ambulatory Visit
Admission: RE | Admit: 2022-05-03 | Discharge: 2022-05-03 | Disposition: A | Discharge status: Home / Self Care | Payer: Medicare HMO | Attending: Urology | Admitting: Urology

## 2022-05-03 DIAGNOSIS — R3989 Other symptoms and signs involving the genitourinary system: Secondary | ICD-10-CM

## 2022-05-03 DIAGNOSIS — N390 Urinary tract infection, site not specified: Secondary | ICD-10-CM | POA: Insufficient documentation

## 2022-05-03 LAB — URINALYSIS, COMPLETE (UACMP) WITH MICROSCOPIC
Bilirubin Urine: NEGATIVE
Glucose, UA: NEGATIVE mg/dL
Ketones, ur: NEGATIVE mg/dL
Nitrite: POSITIVE — AB
Protein, ur: NEGATIVE mg/dL
Specific Gravity, Urine: 1.025 (ref 1.005–1.030)
pH: 5.5 (ref 5.0–8.0)

## 2022-05-03 MED ORDER — OXYCODONE HCL 30 MG PO TABS: 15.0000 mg | ORAL_TABLET | Freq: Four times a day (QID) | ORAL | 0 refills | Status: DC | PRN

## 2022-05-03 MED ORDER — NITROFURANTOIN MONOHYD MACRO 100 MG PO CAPS: 100.0000 mg | ORAL_CAPSULE | Freq: Two times a day (BID) | ORAL | 0 refills | Status: AC

## 2022-05-04 ENCOUNTER — Telehealth: Payer: Medicare HMO

## 2022-05-04 NOTE — Progress Notes (Deleted)
Chronic Care Management Pharmacy Note  05/04/2022 Name:  Dalten Ambrosino MRN:  [id number] DOB:  11/15/1980  Summary: ***  Recommendations/Changes made from today's visit: ***  Plan: ***   Subjective: Twan Harkin is an 41 y.o. year old male who is a primary patient of Fisher, Kirstie Peri, MD.  The CCM team was consulted for assistance with disease management and care coordination needs.    Engaged with patient by telephone for initial visit in response to provider referral for pharmacy case management and/or care coordination services.   Consent to Services:  The patient was given the following information about Chronic Care Management services today, agreed to services, and gave verbal consent: 1. CCM service includes personalized support from designated clinical staff supervised by the primary care provider, including individualized plan of care and coordination with other care providers 2. 24/7 contact phone numbers for assistance for urgent and routine care needs. 3. Service will only be billed when office clinical staff spend 20 minutes or more in a month to coordinate care. 4. Only one practitioner may furnish and bill the service in a calendar month. 5.The patient may stop CCM services at any time (effective at the end of the month) by phone call to the office staff. 6. The patient will be responsible for cost sharing (co-pay) of up to 20% of the service fee (after annual deductible is met). Patient agreed to services and consent obtained.  Patient Care Team: Birdie Sons, MD as PCP - General (Family Medicine) Caryn Section Kirstie Peri, MD as PCP - Family Medicine (Family Medicine) Leonel Ramsay, MD (Infectious Diseases) [place] Sharlynn Oliphant, MD (Urology) Germaine Pomfret, [facility] (Pharmacist)  Recent office visits: 04/26/2022 Dr. Caryn Section MD (PCP) start predniSONE (DELTASONE) 50 MG tablet, diphenhydramine 50 mg  04/23/2022 Dr. Caryn Section MD (PCP) No Medication  Changes noted 04/20/2022 Dr. Caryn Section MD (PCP)  Start fluconazole (DIFLUCAN) 150 MG tablet, start ketoconazole 2% 04/16/2022 [name] PA-C (PCP Office) Stopped current antibiotic/cipro, Referred to pain clinic, Referred to neurology 04/12/2022 [name] PA-C (PCP Office)  No Medication Changes noted, AMB Referral to [facility] 03/19/2022 [name] PA-C (PCP Office) Start cephALEXin (KEFLEX) 500 MG capsule 02/16/2022 [name] PA-C (PCP Office) No Medication Changes noted 01/14/2022 Dr. Caryn Section MD (PCP) start Cefdinir 300 mg for 10 days 01/10/2022 Kirke Shaggy LPN (PCP Office) Medicare Wellness completed, No medication Changes noted 01/09/2022 Dr. Caryn Section MD (PCP) No medication Changes noted 12/19/2021 Dr. Caryn Section MD (PCP) start amoxicillin (AMOXIL) 875 MG tablet 12/05/2021 [name] PA (PCP Office) Increase naproxen sodium (ANAPROX DS) 550 MG tablet 2 times daily, start sulfamethoxazole- trimethoprim 2 times daily, Ambulatory referral to Urology.  Recent consult visits: 05/01/2022 Dr. Diamantina Providence MD (Urology) No Medication Changes noted.  11/27/2021 Kalman Shan DO (Wound) No Medication Changes noted 11/20/2021 Dr. Vicente Males MD (Gastroenterology) Start Na Sulfate-K Sulfate-Mg Sulf, Follow up in 8 to 12 weeks 11/13/2021 Jeri Cos PA_C (wound) No Medication Changes noted  Hospital visits: None in previous 6 months   Objective:  Lab Results  Component Value Date   CREATININE 0.81 04/20/2022   BUN 16 04/20/2022   EGFR 114 04/20/2022   GFRNONAA 113 09/15/2020   GFRAA 130 09/15/2020   NA 139 04/20/2022   K 3.8 04/20/2022   CALCIUM 9.2 04/20/2022   CO2 21 04/20/2022   GLUCOSE 75 04/20/2022    No results found for: "HGBA1C", "FRUCTOSAMINE", "GFR", "MICROALBUR"  Last diabetic Eye exam: No results found for: "HMDIABEYEEXA"  Last  diabetic Foot exam: No results found for: "HMDIABFOOTEX"   Lab Results  Component Value Date   CHOL 76 (L) 10/22/2017   HDL 34  (L) 10/22/2017   LDLCALC 19 10/22/2017   TRIG 113 10/22/2017   CHOLHDL 2.2 10/22/2017       Latest Ref Rng & Units 04/20/2022   10:42 AM 12/05/2021    4:21 PM 09/15/2020    3:42 PM  Hepatic Function  Total Protein 6.0 - 8.5 g/dL 6.7  6.4  6.6   Albumin 4.1 - 5.1 g/dL 4.6  4.5  4.3   AST 0 - 40 IU/L 48  20  19   ALT 0 - 44 IU/L 40  22  24   Alk Phosphatase 44 - 121 IU/L 69  76  74   Total Bilirubin 0.0 - 1.2 mg/dL 0.6  0.4  0.3     Lab Results  Component Value Date/Time   TSH 3.160 04/20/2022 10:42 AM   TSH 0.921 12/05/2021 04:21 PM   FREET4 1.99 (H) 12/05/2021 04:21 PM   FREET4 1.71 09/15/2020 03:42 PM       Latest Ref Rng & Units 04/20/2022   10:42 AM 12/05/2021    4:21 PM 09/15/2020    3:42 PM  CBC  WBC 3.4 - 10.8 x10E3/uL 8.3  7.2  4.4   Hemoglobin 13.0 - 17.7 g/dL 13.2  14.7  14.1   Hematocrit 37.5 - 51.0 % 40.1  43.1  41.0   Platelets 150 - 450 x10E3/uL 143  193  141     No results found for: "VD25OH"  Clinical ASCVD: {YES/NO:21197} The ASCVD Risk score (Arnett DK, et al., 2019) failed to calculate for the following reasons:   Cannot find a previous HDL lab   Cannot find a previous total cholesterol lab       01/10/2022    3:36 PM 12/05/2021    4:26 PM 05/10/2017    3:58 PM  Depression screen PHQ 2/9  Decreased Interest _0 Down, Depressed, Hopeless  2 2  PHQ - 2 Score _1 Altered sleeping _2 Tired, decreased energy _3 Change in appetite _4 Feeling bad or failure about yourself  _5 Trouble concentrating _6 Moving slowly or fidgety/restless 0 0 0  Suicidal thoughts  0 0  PHQ-9 Score _7 Difficult doing work/chores Somewhat difficult  Very difficult     ***Other: (CHADS2VASc if Afib, MMRC or CAT for COPD, ACT, DEXA)  Social History   Tobacco Use  Smoking Status Former   Passive exposure: Past  Smokeless Tobacco Never  Tobacco Comments   Using Nicorette Gum   BP Readings from Last 3 Encounters:  05/01/22 106/67   04/20/22 137/82  04/16/22 122/76   Pulse Readings from Last 3 Encounters:  05/01/22 (!) 114  04/20/22 100  04/16/22 81   Wt Readings from Last 3 Encounters:  05/01/22 160 lb (72.6 kg)  04/20/22 160 lb (72.6 kg)  01/10/22 160 lb (72.6 kg)   BMI Readings from Last 3 Encounters:  05/01/22 19.48 kg/m  04/20/22 19.48 kg/m  01/10/22 19.48 kg/m    Assessment/Interventions: Review of patient past medical history, allergies, medications, health status, including review of consultants reports, laboratory and other test data, was performed as part of comprehensive evaluation and provision of chronic care management services.   SDOH:  (Social Determinants of Health) assessments and  interventions performed: {yes/no:20286}  SDOH Screenings   Alcohol Screen: Low Risk  (01/10/2022)   Alcohol Screen    Last Alcohol Screening Score (AUDIT): 0  Depression (PHQ2-9): Medium Risk (01/10/2022)   Depression (PHQ2-9)    PHQ-2 Score: 6  Financial Resource Strain: Low Risk  (01/10/2022)   Overall Financial Resource Strain (CARDIA)    Difficulty of Paying Living Expenses: Not hard at all  Food Insecurity: No Food Insecurity (01/10/2022)   Hunger Vital Sign    Worried About Running Out of Food in the Last Year: Never true    [place] in the Last Year: Never true  Housing: Low Risk  (01/10/2022)   Housing    Last Housing Risk Score: 0  Physical Activity: Not on file  Social Connections: Socially Isolated (01/10/2022)   Social Connection and Isolation Panel [NHANES]    Frequency of Communication with Friends and Family: Twice a week    Frequency of Social Gatherings with Friends and Family: Never    Attends Religious Services: Never    Marine scientist or Organizations: No    Attends Archivist Meetings: Never    Marital Status: Married  Stress: No Stress Concern Present (01/10/2022)   [employer] [place]    Feeling of  Stress : Only a little  Tobacco Use: Medium Risk (05/01/2022)   Patient History    Smoking Tobacco Use: Former    Smokeless Tobacco Use: Never    Passive Exposure: Past  Transportation Needs: No Transportation Needs (01/10/2022)   PRAPARE - Hydrologist (Medical): No    Lack of Transportation (Non-Medical): No    CCM Care Plan  Allergies  Allergen Reactions   Morphine     psychosis   Azithromycin     throat closes   Iodine    Lorazepam     extremely hostile   Meperidine    Morphine Sulfate     Psychotic symptoms    Medications Reviewed Today     Reviewed by Despina Hidden, CMA (Certified Medical Assistant) on 05/01/22 at [place] List Status: <None>   Medication Order Taking? Sig Documenting Provider Last Dose Status Informant  albuterol (VENTOLIN HFA) 108 (90 Base) MCG/ACT inhaler [id number] Yes Inhale 2 puffs into the lungs every 6 (six) hours as needed for wheezing or shortness of breath. Birdie Sons, MD Taking Active   alprazolam Duanne Moron) 2 MG tablet [id number] Yes TAKE ONE TABLET EVERY SIX HOURS IF NEEDED Birdie Sons, MD Taking Active   amphetamine-dextroamphetamine (ADDERALL) 15 MG tablet [id number] Yes Take 1 tablet by mouth 2 (two) times daily. Birdie Sons, MD Taking Active   azelastine (OPTIVAR) 0.05 % ophthalmic solution [id number] Yes Place 1 drop into both eyes 2 (two) times daily. [name], PA-C Taking Active   baclofen (LIORESAL) 20 MG tablet [id number] Yes TAKE ONE TABLET FOUR TIMES DAILY Birdie Sons, MD Taking Active   clotrimazole (LOTRIMIN) 1 % cream [id number] Yes Apply 1 application topically 2 (two) times daily. Birdie Sons, MD Taking Active   clotrimazole-betamethasone Donalynn Furlong) cream [id number] Yes Apply 1 application topically 2 (two) times daily. Chrismon, Vickki Muff, PA-C Taking Active   diphenhydrAMINE (BENADRYL) 50 MG capsule [id number] Yes Take 1 capsule 1 hour before procedure Birdie Sons,  MD Taking Active   docusate sodium (COLACE) 250 MG capsule [id number] Yes Take 1 capsule (250 mg total) by mouth 4 (  four) times daily as needed. Birdie Sons, MD Taking Active   dronabinol (MARINOL) 10 MG capsule [id number] Yes Take 1 capsule (10 mg total) by mouth every 6 (six) hours as needed. Birdie Sons, MD Taking Active   fesoterodine (TOVIAZ) 8 MG TB24 tablet [id number] Yes Take 1 tablet (8 mg total) by mouth daily. Birdie Sons, MD Taking Active   flavoxATE (URISPAS) 100 MG tablet [id number] Yes Take 1 tablet (100 mg total) by mouth 3 (three) times daily as needed for bladder spasms. Chrismon, Vickki Muff, PA-C Taking Active   fluticasone (FLONASE) 50 MCG/ACT nasal spray [id number] Yes Place 2 sprays into both nostrils daily. Muthersbaugh, Jarrett Soho, PA-C Taking Active   gabapentin (NEURONTIN) 800 MG tablet [id number] Yes Take 1 tablet (800 mg total) by mouth 4 (four) times daily. Birdie Sons, MD Taking Active   ketoconazole (NIZORAL) 2 % cream [id number] Yes Apply 1 Application topically 2 (two) times daily. Birdie Sons, MD Taking Active   levothyroxine (SYNTHROID) 88 MCG tablet [id number] Yes Take 1 tablet (88 mcg total) by mouth daily. Birdie Sons, MD Taking Active   lidocaine (LMX) 4 % cream [id number] Yes Apply 1 Application topically 3 (three) times daily. [name], PA-C Taking Active   linaclotide Rolan Lipa) 145 MCG CAPS capsule [id number] Yes Take 1 capsule (145 mcg total) by mouth daily before breakfast. Birdie Sons, MD Taking Active   methadone (DOLOPHINE) 10 MG tablet [id number] Yes Take 2 tablets (20 mg total) by mouth every 6 (six) hours. Birdie Sons, MD Taking Active   methylPREDNISolone acetate (DEPO-MEDROL) injection 40 mg [id number]   Birdie Sons, MD  Active   Mineral Oil OIL [id number] Yes daily. [provider] Taking Active            Med Note Delsa Sale Dec 10, 2019  1:23 PM)    naproxen sodium (ANAPROX) 550 MG tablet  [id number] Yes TAKE 1 TABLET BY MOUTH 2 TIMES DAILY WITH A MEAL. Birdie Sons, MD Taking Active   omeprazole (PRILOSEC) 20 MG capsule [id number] Yes TAKE 1 CAPSULE BY MOUTH ONCE DAILY Birdie Sons, MD Taking Active   oxybutynin (DITROPAN) 5 MG tablet [id number] Yes Take 5 mg by mouth daily as needed. [provider] Taking Active   oxyCODONE (ROXICODONE) 30 MG immediate release tablet [id number] Yes Take 0.5 tablets (15 mg total) by mouth every 6 (six) hours as needed. for pain Jerrol Banana., MD Taking Active   phenazopyridine (PYRIDIUM) 200 MG tablet [id number] Yes Take 1 tablet (200 mg total) by mouth 3 (three) times daily. Chrismon, Vickki Muff, PA-C Taking Active   polyethylene glycol powder (QC NATURA-LAX) 17 GM/SCOOP powder [id number] Yes MIX 1 CAPFUL IN 8 0Z OF WATER OR JUICE AND DRINK ONCE A DAY Fisher, Kirstie Peri, MD Taking Active   predniSONE (DELTASONE) 50 MG tablet [id number] Yes Take 1 tablet 13 hours before procedure , 1 tablet 7 hours before procedure and 1 tablet 1 hour before procedure. Birdie Sons, MD Taking Active   promethazine (PHENERGAN) 25 MG suppository [id number] Yes Place 1 suppository (25 mg total) rectally every 6 (six) hours as needed for nausea or vomiting. Birdie Sons, MD Taking Active   promethazine (PHENERGAN) 25 MG tablet [id number] Yes TAKE 1 TABLET BY MOUTH EVERY 4 HOURS AS NEEDED FOR NAUSEA AND VOMITING Birdie Sons, MD Taking Active   sildenafil (REVATIO) 20 MG tablet [id number] Yes Take 3 to  5 tablets two hours before intercouse on an empty stomach.  Do not take with nitrates. Birdie Sons, MD Taking Active   silver sulfADIAZINE (SSD) 1 % cream [id number] Yes APPLY TO AFFECTED AREAS TOPICALLY TWICE DAILY Birdie Sons, MD Taking Active   tiZANidine (ZANAFLEX) 4 MG tablet [id number] Yes TAKE 2 TABLETS BY MOUTH 4 TIMES DAILY Birdie Sons, MD Taking Active             Patient Active Problem List   Diagnosis Date Noted    Infection due to urethral catheter ([place]) 04/22/2020   Spinal cord injury, thoracic region ([place]) 11/15/2017   Chronic, continuous use of opioids 04/27/2016   Depression 11/30/2015   Chronic hoarseness 10/26/2015   Hypothyroidism 10/26/2015   Elevated transaminase level 08/10/2015   Spasticity 07/30/2015   Paraplegia ([place]) 07/21/2015   Bladder neurogenesis 04/28/2015   Infection due to drug-resistant organism 03/22/2015   Chronic UTI 03/11/2015   Chronic pain associated with significant psychosocial dysfunction 02/18/2015   ADD (attention deficit disorder) 02/17/2015   Anxiety 02/17/2015   Chronic constipation 02/17/2015   Bed sore on buttock 02/17/2015   Abscess, dental 02/17/2015   Dermatitis, eczematoid 02/17/2015   Dizziness 02/17/2015   Esophageal reflux 02/17/2015   Hypesthesia 02/17/2015   Decubitus ulcer 02/17/2015   Spasm 02/17/2015   Concussion and swelling of spinal cord 02/17/2015   Body tinea 02/17/2015   GERD (gastroesophageal reflux disease) 02/17/2015    Immunization History  Administered Date(s) Administered   Influenza,inj,Quad PF,6+ Mos 06/25/2014, 07/21/2015, 11/12/2016, 10/22/2017, 06/17/2018, 06/22/2019   Influenza-Unspecified 06/27/2016   Pneumococcal Polysaccharide-23 06/25/2014   Tdap 02/16/2022    Conditions to be addressed/monitored:  GERD, Anxiety, and ADHD, Chronic Pain, Bladder Neurogenesis and Chronic constipation  There are no care plans that you recently modified to display for this patient.    Medication Assistance: {MEDASSISTANCEINFO:25044}  Compliance/Adherence/Medication fill history: Care Gaps: COVID-10 Vaccine HIV Screening Hepatitis C Screening Influenza Vaccine  Star-Rating Drugs: None ID  Patient's preferred pharmacy is:  [facility] - [place], [place] - [address] [place] [address] Phone: [phone] Fax: [phone]  [place], [place] [address] Phone: [phone] Fax: [phone]  Uses pill box? {Yes or If no, why not?:20788} Pt endorses ***% compliance  We discussed: {Pharmacy options:24294} Patient decided to: {US Pharmacy Plan:23885}  Care Plan and Follow Up Patient Decision:  {FOLLOWUP:24991}  Plan: {CM FOLLOW UP BPZW:25852}  ***  Current Barriers:  {pharmacybarriers:24917}  Pharmacist Clinical Goal(s):  Patient will {PHARMACYGOALCHOICES:24921} through collaboration with PharmD and provider.   Interventions: 1:1 collaboration with Birdie Sons, MD regarding development and update of comprehensive plan of care as evidenced by provider attestation and co-signature Inter-disciplinary care team collaboration (see longitudinal plan of care) Comprehensive medication review performed; medication list updated in electronic medical record  Depression/Anxiety (Goal: ***) -{US controlled/uncontrolled:25276} -Current treatment: Alprazolam 2 mg every 6 hours as needed -Medications previously tried/failed: *** -PHQ9: *** -GAD7: *** -Connected with *** for mental health support -Educated on {CCM mental health counseling:25127} -{CCMPHARMDINTERVENTION:25122}  *** (Goal: ***) -{US controlled/uncontrolled:25276} -Current treatment  Adderall 15 mg twice daily  -Medications previously tried: ***  -{CCMPHARMDINTERVENTION:25122}  *** (Goal: ***) -{US controlled/uncontrolled:25276} -Current treatment  Levothyroxine 88 mcg daily  -Medications previously tried: ***  -{CCMPHARMDINTERVENTION:25122}   *** (Goal: ***) -{US controlled/uncontrolled:25276} -Current treatment  Baclofen 20 mg four times daily  Gabapentin 800 mg four times  daily  Methadone 10 mg 2 tablets every 6 hours Naproxen 550 mg twice daily  Oxycodone 15 mg every 6 hours as needed  Tizanidine 8 mg four time sdaily  -Medications previously tried: ***  -{CCMPHARMDINTERVENTION:25122}  *** (Goal: ***) -{US  controlled/uncontrolled:25276} -Current treatment  Docusate 250 mg four times daily as needed  Linzess 145 mcg daily  Mineral Oil daily  Miralax daily  -Medications previously tried: ***  -{CCMPHARMDINTERVENTION:25122}   Patient Goals/Self-Care Activities Patient will:  - {pharmacypatientgoals:24919}  Follow Up Plan: {CM FOLLOW UP ONGE:95284}

## 2022-05-06 LAB — URINE CULTURE: Culture: >=100000 — AB

## 2022-05-07 ENCOUNTER — Other Ambulatory Visit: Payer: Self-pay

## 2022-05-07 DIAGNOSIS — B9620 Unspecified Escherichia coli [E. coli] as the cause of diseases classified elsewhere: Secondary | ICD-10-CM

## 2022-05-07 MED ORDER — CEPHALEXIN 500 MG PO CAPS: 500.0000 mg | ORAL_CAPSULE | Freq: Two times a day (BID) | ORAL | 0 refills | Status: AC

## 2022-05-08 ENCOUNTER — Ambulatory Visit: Payer: Medicare HMO

## 2022-05-08 ENCOUNTER — Ambulatory Visit: Payer: Medicare HMO | Admitting: Family Medicine

## 2022-05-14 ENCOUNTER — Telehealth: Payer: Self-pay

## 2022-05-14 NOTE — Telephone Encounter (Signed)
Copied from CRM [id number]. Topic: General - Other >> May 14, 2022  3:22 PM Everette C wrote: Reason for CRM: The patient has called to follow up on their pre authorization for an upcoming ct scan   The patient would like to know if everything has gone through successfully   The patient also had additional questions related to preparing for the scan   The patient has additional questions related to medications they've been temporarily prescribed   Please contact further when possible

## 2022-05-16 LAB — CK: Total CK: 1801 U/L (ref 49–439)

## 2022-05-16 LAB — SPECIMEN STATUS REPORT

## 2022-05-16 NOTE — Telephone Encounter (Addendum)
Patient has a CT scan with contrast scheduled on 05/29/2022. He has an allergy to iodine and was advised that he needed to take prednisone and Benadryl before procedure. Prescription for Prednisone was sent into pharmacy on 04/27/2022.  Please advise on directions of how and when to take Benadryl prior to CT.

## 2022-05-21 ENCOUNTER — Other Ambulatory Visit: Payer: Self-pay | Admitting: Family Medicine

## 2022-05-21 DIAGNOSIS — G894 Chronic pain syndrome: Secondary | ICD-10-CM

## 2022-05-21 DIAGNOSIS — B372 Candidiasis of skin and nail: Secondary | ICD-10-CM

## 2022-05-21 DIAGNOSIS — F988 Other specified behavioral and emotional disorders with onset usually occurring in childhood and adolescence: Secondary | ICD-10-CM

## 2022-05-22 ENCOUNTER — Other Ambulatory Visit: Payer: Self-pay

## 2022-05-22 DIAGNOSIS — R399 Unspecified symptoms and signs involving the genitourinary system: Secondary | ICD-10-CM

## 2022-05-22 MED ORDER — DIPHENHYDRAMINE HCL 50 MG PO CAPS
ORAL_CAPSULE | ORAL | 0 refills | Status: DC
Start: 2022-05-22 — End: 2023-01-03

## 2022-05-22 MED ORDER — KETOCONAZOLE 2 % EX CREA: 1.0000 | TOPICAL_CREAM | Freq: Two times a day (BID) | CUTANEOUS | 5 refills | Status: DC

## 2022-05-23 ENCOUNTER — Other Ambulatory Visit: Payer: Self-pay | Admitting: Family Medicine

## 2022-05-23 DIAGNOSIS — G894 Chronic pain syndrome: Secondary | ICD-10-CM

## 2022-05-23 DIAGNOSIS — R339 Retention of urine, unspecified: Secondary | ICD-10-CM | POA: Diagnosis not present

## 2022-05-25 ENCOUNTER — Other Ambulatory Visit: Payer: Self-pay | Admitting: Family Medicine

## 2022-05-25 DIAGNOSIS — R110 Nausea: Secondary | ICD-10-CM

## 2022-05-25 MED ORDER — AMPHETAMINE-DEXTROAMPHETAMINE 15 MG PO TABS: 1.0000 | ORAL_TABLET | Freq: Two times a day (BID) | ORAL | 0 refills | Status: DC

## 2022-05-28 ENCOUNTER — Other Ambulatory Visit
Admission: RE | Admit: 2022-05-28 | Discharge: 2022-05-28 | Disposition: A | Discharge status: Home / Self Care | Payer: Medicare HMO | Attending: Urology | Admitting: Urology

## 2022-05-28 ENCOUNTER — Encounter: Payer: Self-pay | Admitting: Family Medicine

## 2022-05-28 DIAGNOSIS — R399 Unspecified symptoms and signs involving the genitourinary system: Secondary | ICD-10-CM | POA: Diagnosis not present

## 2022-05-28 LAB — URINALYSIS, COMPLETE (UACMP) WITH MICROSCOPIC
Bilirubin Urine: NEGATIVE
Glucose, UA: NEGATIVE mg/dL
Hgb urine dipstick: NEGATIVE
Ketones, ur: NEGATIVE mg/dL
Nitrite: NEGATIVE
Protein, ur: NEGATIVE mg/dL
Specific Gravity, Urine: 1.01 (ref 1.005–1.030)
pH: 6.5 (ref 5.0–8.0)

## 2022-05-29 ENCOUNTER — Ambulatory Visit
Admission: RE | Admit: 2022-05-29 | Discharge: 2022-05-29 | Disposition: A | Discharge status: Home / Self Care | Payer: Medicare HMO | Source: Ambulatory Visit | Attending: Family Medicine | Admitting: Family Medicine

## 2022-05-29 DIAGNOSIS — R109 Unspecified abdominal pain: Secondary | ICD-10-CM | POA: Diagnosis not present

## 2022-05-29 DIAGNOSIS — M62838 Other muscle spasm: Secondary | ICD-10-CM | POA: Insufficient documentation

## 2022-05-29 DIAGNOSIS — R102 Pelvic and perineal pain: Secondary | ICD-10-CM | POA: Insufficient documentation

## 2022-05-29 DIAGNOSIS — K6289 Other specified diseases of anus and rectum: Secondary | ICD-10-CM | POA: Insufficient documentation

## 2022-05-29 MED ORDER — IOHEXOL 300 MG/ML  SOLN
100.0000 mL | Freq: Once | INTRAMUSCULAR | Status: AC | PRN
  Administered 2022-05-29: 100 mL via INTRAVENOUS

## 2022-05-30 LAB — URINE CULTURE: Culture: 30000 — AB

## 2022-05-31 ENCOUNTER — Telehealth: Payer: Self-pay

## 2022-05-31 DIAGNOSIS — N390 Urinary tract infection, site not specified: Secondary | ICD-10-CM

## 2022-05-31 MED ORDER — AMOXICILLIN 500 MG PO TABS: 500.0000 mg | ORAL_TABLET | Freq: Three times a day (TID) | ORAL | 0 refills | Status: AC

## 2022-05-31 NOTE — Telephone Encounter (Signed)
-----   Message from Sondra Come, MD sent at 05/30/2022 11:43 AM EDT ----- Urine culture showed a small amount of Enterococcus.  This may just be colonization from his catheterization, but with his ongoing concern for symptoms, would recommend treatment.  Please prescribe amoxicillin 500 mg 3 times daily x14 days.  He needs to establish with [facility] urology neurogenic bladder as well  Legrand Rams, MD 05/30/2022

## 2022-06-01 ENCOUNTER — Other Ambulatory Visit: Payer: Self-pay | Admitting: Family Medicine

## 2022-06-01 ENCOUNTER — Telehealth: Payer: Self-pay | Admitting: Family Medicine

## 2022-06-01 DIAGNOSIS — G894 Chronic pain syndrome: Secondary | ICD-10-CM

## 2022-06-01 DIAGNOSIS — K6289 Other specified diseases of anus and rectum: Secondary | ICD-10-CM

## 2022-06-01 MED ORDER — CIPROFLOXACIN HCL 500 MG PO TABS: 500.0000 mg | ORAL_TABLET | Freq: Two times a day (BID) | ORAL | 0 refills | Status: AC

## 2022-06-01 MED ORDER — PREDNISONE 10 MG PO TABS: ORAL_TABLET | ORAL | 0 refills | Status: AC

## 2022-06-01 NOTE — Telephone Encounter (Signed)
T called asking if he could go back to the Oxy 15 mg instead of the 30 mg.  He said he was using the 30 because the pharmacy was out of 15mg  but has them in stock now.  He also has questions about his antibiotic that he was prescribe for the proctitis.   CB@  [phone]

## 2022-06-04 ENCOUNTER — Encounter: Payer: Self-pay | Admitting: Family Medicine

## 2022-06-04 MED ORDER — OXYCODONE HCL 15 MG PO TABS: 15.0000 mg | ORAL_TABLET | Freq: Four times a day (QID) | ORAL | 0 refills | Status: DC | PRN

## 2022-06-04 NOTE — Telephone Encounter (Signed)
Pt is calling back to follow up. Pt stated he has been out of his medication all weekend.  Pt is requesting a call back today.    Please advise.

## 2022-06-11 ENCOUNTER — Ambulatory Visit: Payer: Medicare HMO | Admitting: Family Medicine

## 2022-06-13 ENCOUNTER — Encounter: Payer: Self-pay | Admitting: Family Medicine

## 2022-06-19 ENCOUNTER — Other Ambulatory Visit: Payer: Self-pay | Admitting: Family Medicine

## 2022-06-19 DIAGNOSIS — F988 Other specified behavioral and emotional disorders with onset usually occurring in childhood and adolescence: Secondary | ICD-10-CM

## 2022-06-19 DIAGNOSIS — G894 Chronic pain syndrome: Secondary | ICD-10-CM

## 2022-06-22 ENCOUNTER — Other Ambulatory Visit: Payer: Self-pay | Admitting: Family Medicine

## 2022-06-26 MED ORDER — SILDENAFIL CITRATE 20 MG PO TABS: ORAL_TABLET | ORAL | 5 refills | Status: DC

## 2022-06-26 NOTE — Addendum Note (Signed)
Addended by: Birdie Sons on: 06/26/2022 09:25 AM   Modules accepted: Orders

## 2022-07-09 ENCOUNTER — Ambulatory Visit: Payer: Self-pay | Admitting: *Deleted

## 2022-07-09 NOTE — Telephone Encounter (Signed)
Pt has been contacted by [facility] urology, looks like they want to schedule a RUS and Cysto. He is trying to "drop off" a urine here, I advised he must be scheduled.  HELP

## 2022-07-09 NOTE — Telephone Encounter (Signed)
  Chief Complaint: patient wants to bring urine sample Symptoms: odor, cloudy, spasm, hot/cold spells Frequency: 2 days Pertinent Negatives: Patient denies  Disposition: [] ED /[] Urgent Care (no appt availability in office) / [] Appointment(In office/virtual)/ []  [facility]/ [] Home Care/ [] Refused Recommended Disposition /[] [facility]/ [x]  Follow-up with PCP Additional Notes: Patient is requesting  an order for urine sample- he states his wife can bring it - patient does have urology appointment tomorrow am.   Reason for Disposition  Bad or foul-smelling urine  Answer Assessment - Initial Assessment Questions 1. SYMPTOM: "What's the main symptom you're concerned about?" (e.g., frequency, incontinence)     Odor, spasm, cloudy 2. ONSET: "When did the  symptoms  start?"     2 days ago 3. PAIN: "Is there any pain?" If Yes, ask: "How bad is it?" (Scale: 1-10; mild, moderate, severe)     spasms 4. CAUSE: "What do you think is causing the symptoms?"     UTI 5. OTHER SYMPTOMS: "Do you have any other symptoms?" (e.g., blood in urine, fever, flank pain, pain with urination)     Hot/cold spells 6. PREGNANCY: "Is there any chance you are pregnant?" "When was your last menstrual period?"  Protocols used: Urinary Symptoms-A-AH

## 2022-07-09 NOTE — Telephone Encounter (Signed)
Summary: poss uti   Pt called saying he thinks he has the start to a UTI and wants to know if he can drop off a urine sample.     CB#  [phone]      No answer, voicemail is full. Pt is also communicating with his urologist via [place].

## 2022-07-10 ENCOUNTER — Encounter: Payer: Self-pay | Admitting: Physician Assistant

## 2022-07-10 ENCOUNTER — Ambulatory Visit (INDEPENDENT_AMBULATORY_CARE_PROVIDER_SITE_OTHER): Payer: Medicare HMO | Admitting: Physician Assistant

## 2022-07-10 VITALS — BP 118/79 | HR 103 | Ht 76.0 in | Wt 200.0 lb

## 2022-07-10 DIAGNOSIS — N390 Urinary tract infection, site not specified: Secondary | ICD-10-CM | POA: Diagnosis not present

## 2022-07-10 LAB — URINALYSIS, COMPLETE
Bilirubin, UA: NEGATIVE
Glucose, UA: NEGATIVE
Ketones, UA: NEGATIVE
Nitrite, UA: NEGATIVE
Specific Gravity, UA: 1.015 (ref 1.005–1.030)
Urobilinogen, Ur: 0.2 mg/dL (ref 0.2–1.0)
pH, UA: 5.5 (ref 5.0–7.5)

## 2022-07-10 LAB — MICROSCOPIC EXAMINATION

## 2022-07-10 MED ORDER — NITROFURANTOIN MONOHYD MACRO 100 MG PO CAPS: 100.0000 mg | ORAL_CAPSULE | Freq: Two times a day (BID) | ORAL | 0 refills | Status: AC

## 2022-07-10 NOTE — Progress Notes (Signed)
07/10/2022 10:28 AM   Justin [name] July 08, 1981 [id number]  CC: Chief Complaint  Patient presents with   Recurrent UTI   HPI: Justin [name] is a 41 y.o. male with PMH neurogenic bladder due to spinal cord injury managed with CIC 6 times daily and recurrent UTIs on cranberry supplements who presents today for evaluation of possible UTI.   Today he reports an approximate 2-day history of bladder and urethral burning, malodorous urine, cloudy urine, and increased spasticity in his legs.  In-office UA today positive for trace intact blood, trace protein, and 1+ leukocytes; urine microscopy with 11-30 WBCs/HPF.  PMH: Past Medical History:  Diagnosis Date   Arthritis    Asthma    Chronic anxiety    Chronic depression    Chronic pain syndrome    secondary to fracture T9 and L3 with extensive spinal surgeery   Postoperative wound infection of right hip 2012   Rheumatic fever     Surgical History: Past Surgical History:  Procedure Laterality Date   BACK SURGERY  2000   Fracture T9 and L3 with paraplegia    Home Medications:  Allergies as of 07/10/2022       Reactions   Morphine    psychosis   Azithromycin    throat closes   Iodine    Lorazepam    extremely hostile   Meperidine    Morphine Sulfate    Psychotic symptoms        Medication List        Accurate as of July 10, 2022 10:28 AM. If you have any questions, ask your nurse or doctor.          albuterol 108 (90 Base) MCG/ACT inhaler Commonly known as: VENTOLIN HFA Inhale 2 puffs into the lungs every 6 (six) hours as needed for wheezing or shortness of breath.   alprazolam 2 MG tablet Commonly known as: XANAX TAKE ONE TABLET EVERY SIX HOURS IF NEEDED   amphetamine-dextroamphetamine 15 MG tablet Commonly known as: ADDERALL TAKE ONE (1) TABLET BY MOUTH TWO TIMES PER DAY   azelastine 0.05 % ophthalmic solution Commonly known as: OPTIVAR Place 1 drop into both eyes 2  (two) times daily.   baclofen 20 MG tablet Commonly known as: LIORESAL TAKE ONE TABLET FOUR TIMES DAILY   clotrimazole 1 % cream Commonly known as: LOTRIMIN Apply 1 application topically 2 (two) times daily.   clotrimazole-betamethasone cream Commonly known as: LOTRISONE Apply 1 application topically 2 (two) times daily.   diphenhydrAMINE 50 MG capsule Commonly known as: BENADRYL Take 1 capsule 1 hour before procedure   docusate sodium 250 MG capsule Commonly known as: COLACE Take 1 capsule (250 mg total) by mouth 4 (four) times daily as needed.   dronabinol 10 MG capsule Commonly known as: MARINOL Take 1 capsule (10 mg total) by mouth every 6 (six) hours as needed.   fesoterodine 8 MG Tb24 tablet Commonly known as: TOVIAZ TAKE 1 TABLET BY MOUTH DAILY   flavoxATE 100 MG tablet Commonly known as: URISPAS Take 1 tablet (100 mg total) by mouth 3 (three) times daily as needed for bladder spasms.   fluticasone 50 MCG/ACT nasal spray Commonly known as: FLONASE Place 2 sprays into both nostrils daily.   gabapentin 800 MG tablet Commonly known as: NEURONTIN Take 1 tablet (800 mg total) by mouth 4 (four) times daily.   ketoconazole 2 % cream Commonly known as: NIZORAL Apply 1 Application topically 2 (two) times daily.   levothyroxine  88 MCG tablet Commonly known as: SYNTHROID Take 1 tablet (88 mcg total) by mouth daily.   lidocaine 4 % cream Commonly known as: LMX Apply 1 Application topically 3 (three) times daily.   linaclotide 145 MCG Caps capsule Commonly known as: Linzess Take 1 capsule (145 mcg total) by mouth daily before breakfast.   methadone 10 MG tablet Commonly known as: DOLOPHINE TAKE TWO TABLETS BY MOUTH EVERY 6 HOURS   Mineral Oil Oil daily.   naproxen sodium 550 MG tablet Commonly known as: ANAPROX TAKE 1 TABLET BY MOUTH 2 TIMES DAILY WITH A MEAL.   nitrofurantoin (macrocrystal-monohydrate) 100 MG capsule Commonly known as: MACROBID Take 1  capsule (100 mg total) by mouth 2 (two) times daily for 7 days. Started by: Debroah Loop, PA-C   omeprazole 20 MG capsule Commonly known as: PRILOSEC TAKE 1 CAPSULE BY MOUTH ONCE DAILY   oxybutynin 5 MG tablet Commonly known as: DITROPAN Take 5 mg by mouth daily as needed.   oxyCODONE 15 MG immediate release tablet Commonly known as: ROXICODONE Take 1 tablet (15 mg total) by mouth every 6 (six) hours as needed. for pain   phenazopyridine 200 MG tablet Commonly known as: PYRIDIUM Take 1 tablet (200 mg total) by mouth 3 (three) times daily.   polyethylene glycol powder 17 GM/SCOOP powder Commonly known as: QC Natura-LAX MIX 1 CAPFUL IN 8 0Z OF WATER OR JUICE AND DRINK ONCE A DAY   promethazine 25 MG tablet Commonly known as: PHENERGAN TAKE 1 TABLET BY MOUTH EVERY 4 HOURS AS NEEDED FOR NAUSEA AND VOMITING   promethazine 25 MG suppository Commonly known as: PHENERGAN PLACE 1 SUPPOSITORY RECTALLY EVERY 6 HOURS AS NEEDED FOR NAUSEA OR VOMITING   sildenafil 20 MG tablet Commonly known as: REVATIO Take 3 to 5 tablets two hours before intercouse on an empty stomach.  Do not take with nitrates.   silver sulfADIAZINE 1 % cream Commonly known as: SSD APPLY TO AFFECTED AREAS TOPICALLY TWICE DAILY   tiZANidine 4 MG tablet Commonly known as: ZANAFLEX TAKE 2 TABLETS BY MOUTH 4 TIMES DAILY        Allergies:  Allergies  Allergen Reactions   Morphine     psychosis   Azithromycin     throat closes   Iodine    Lorazepam     extremely hostile   Meperidine    Morphine Sulfate     Psychotic symptoms    Family History: Family History  Problem Relation Age of Onset   Other Mother    Hypertension Father    Gout Father    Prostate cancer Neg Hx    Kidney cancer Neg Hx     Social History:   reports that he has quit smoking. He has been exposed to tobacco smoke. He has never used smokeless tobacco. He reports current alcohol use. He reports that he does not use  drugs.  Physical Exam: BP 118/79   Pulse (!) 103   Ht 6\' 4"  (1.93 m)   Wt 200 lb (90.7 kg)   BMI 24.34 kg/m   Constitutional:  Alert and oriented, no acute distress, nontoxic appearing HEENT: [place], AT Cardiovascular: No clubbing, cyanosis, or edema Respiratory: Normal respiratory effort, no increased work of breathing Skin: No rashes, bruises or suspicious lesions Neurologic: Grossly intact, no focal deficits, moving all 4 extremities Psychiatric: Normal mood and affect  Laboratory Data: Results for orders placed or performed in visit on 07/10/22  Microscopic Examination   Urine  Result  Value Ref Range   WBC, UA 11-30 (A) 0 - 5 /hpf   RBC, Urine 0-2 0 - 2 /hpf   Epithelial Cells (non renal) 0-10 0 - 10 /hpf   Bacteria, UA Few None seen/Few  Urinalysis, Complete  Result Value Ref Range   Specific Gravity, UA 1.015 1.005 - 1.030   pH, UA 5.5 5.0 - 7.5   Color, UA Yellow Yellow   Appearance Ur Clear Clear   Leukocytes,UA 1+ (A) Negative   Protein,UA Trace (A) Negative/Trace   Glucose, UA Negative Negative   Ketones, UA Negative Negative   RBC, UA Trace (A) Negative   Bilirubin, UA Negative Negative   Urobilinogen, Ur 0.2 0.2 - 1.0 mg/dL   Nitrite, UA Negative Negative   Microscopic Examination See below:    Assessment & Plan:   1. Recurrent UTI UA today notable for pyuria and mild bacteriuria.  Clinically infected.  Will start empiric Macrobid and send for culture for further evaluation.  Encouraged him to follow-up with [facility] for urodynamics as previously recommended by Dr. Diamantina Providence. - Urinalysis, Complete - CULTURE, URINE COMPREHENSIVE - nitrofurantoin, macrocrystal-monohydrate, (MACROBID) 100 MG capsule; Take 1 capsule (100 mg total) by mouth 2 (two) times daily for 7 days.  Dispense: 14 capsule; Refill: 0  Return if symptoms worsen or fail to improve.  Debroah Loop, PA-C  [facility] [address] [place], [place]  [id number] [phone]

## 2022-07-14 LAB — CULTURE, URINE COMPREHENSIVE

## 2022-07-16 DIAGNOSIS — R339 Retention of urine, unspecified: Secondary | ICD-10-CM | POA: Diagnosis not present

## 2022-07-19 ENCOUNTER — Other Ambulatory Visit: Payer: Self-pay | Admitting: Family Medicine

## 2022-07-19 NOTE — Telephone Encounter (Signed)
Requested medication (s) are due for refill today -expired Rx  Requested medication (s) are on the active medication list -yes  Future visit scheduled -no  Last refill: 07/05/21 #90 4RF  Notes to clinic: expired Rx  Requested Prescriptions  Pending Prescriptions Disp Refills   omeprazole (PRILOSEC) 20 MG capsule [Pharmacy Med Name: OMEPRAZOLE DR 20 MG CAP] 90 capsule 4    Sig: TAKE 1 CAPSULE BY MOUTH ONCE DAILY     Gastroenterology: Proton Pump Inhibitors Passed - 07/19/2022 10:42 AM      Passed - Valid encounter within last 12 months    Recent Outpatient Visits           2 months ago Pelvic pain   [place], MD   3 months ago Muscle spasm of right lower extremity   [facility] Birdie Sons, MD   3 months ago Chronic UTI   [facility] [place], [place], PA-C   3 months ago Paraplegia [facility])   [facility] [place], [place], PA-C   4 months ago Recurrent UTI   [facility] [place], [place], PA-C       Future Appointments             In 2 months Ralene Bathe, MD [place]               Requested Prescriptions  Pending Prescriptions Disp Refills   omeprazole (PRILOSEC) 20 MG capsule [Pharmacy Med Name: OMEPRAZOLE DR 20 MG CAP] 90 capsule 4    Sig: TAKE 1 CAPSULE BY MOUTH ONCE DAILY     Gastroenterology: Proton Pump Inhibitors Passed - 07/19/2022 10:42 AM      Passed - Valid encounter within last 12 months    Recent Outpatient Visits           2 months ago Pelvic pain   [place], MD   3 months ago Muscle spasm of right lower extremity   [facility] Birdie Sons, MD   3 months ago Chronic UTI   [facility], [place], PA-C   3 months ago Paraplegia [facility])   [facility] [place], [place], PA-C   4 months ago Recurrent UTI   [facility] [place], [place], PA-C        Future Appointments             In 2 months Ralene Bathe, MD [place]

## 2022-07-20 ENCOUNTER — Encounter: Payer: Self-pay | Admitting: Family Medicine

## 2022-07-20 ENCOUNTER — Ambulatory Visit: Payer: Self-pay | Admitting: *Deleted

## 2022-07-20 ENCOUNTER — Other Ambulatory Visit: Payer: Self-pay | Admitting: Family Medicine

## 2022-07-20 DIAGNOSIS — G894 Chronic pain syndrome: Secondary | ICD-10-CM

## 2022-07-20 DIAGNOSIS — F988 Other specified behavioral and emotional disorders with onset usually occurring in childhood and adolescence: Secondary | ICD-10-CM

## 2022-07-20 DIAGNOSIS — G8220 Paraplegia, unspecified: Secondary | ICD-10-CM

## 2022-07-20 NOTE — Telephone Encounter (Signed)
This has already been called in and it is not delegated and has been sent and the nurse for Dr Caryn Section has forwarded it with a red arrow, high priority. Pt is so very upset that they made him wait until time to renew it and now it is the weekend and if it does not get done today he will have to go to the ED with muscle spasms. PLEASE make sure his Methadone gets filled prior to pharmacy closing. Answer Assessment - Initial Assessment Questions 1. DRUG NAME: "What medicine do you need to have refilled?"     methadone 2. REFILLS REMAINING: "How many refills are remaining?" (Note: The label on the medicine or pill bottle will show how many refills are remaining. If there are no refills remaining, then a renewal may be needed.)     non 3. EXPIRATION DATE: "What is the expiration date?" (Note: The label states when the prescription will expire, and thus can no longer be refilled.)      4. PRESCRIBING HCP: "Who prescribed it?" Reason: If prescribed by specialist, call should be referred to that group.     Dr Caryn Section 5. SYMPTOMS: "Do you have any symptoms?"     no 6. PREGNANCY: "Is there any chance that you are pregnant?" "When was your last menstrual period?"     no  Protocols used: Medication Refill and Renewal Call-A-AH

## 2022-07-20 NOTE — Telephone Encounter (Signed)
Already done

## 2022-07-20 NOTE — Telephone Encounter (Signed)
Patient has 1 Methodone pill left and he is requesting this be sent in today before we leave

## 2022-07-20 NOTE — Telephone Encounter (Signed)
Requested medication (s) are due for refill today: yes  Requested medication (s) are on the active medication list: yes  Last refill:  tizanidine: 07/05/21 #240 12 RF        Adderall: 06/21/22 #60         Methadone: 06/21/22 #240  Future visit scheduled: no  Notes to clinic:  med not delegated to NT to Reorder   Requested Prescriptions  Pending Prescriptions Disp Refills   tiZANidine (ZANAFLEX) 4 MG tablet [Pharmacy Med Name: TIZANIDINE HCL 4 MG TAB] 240 tablet 12    Sig: TAKE 2 TABLETS BY MOUTH 4 TIMES DAILY     Not Delegated - Cardiovascular:  Alpha-2 Agonists - tizanidine Failed - 07/20/2022  8:21 AM      Failed - This refill cannot be delegated      Passed - Valid encounter within last 6 months    Recent Outpatient Visits           2 months ago Pelvic pain   [facility] Malva Limes, MD   3 months ago Muscle spasm of right lower extremity   [facility] Malva Limes, MD   3 months ago Chronic UTI   [facility] [place], [place], PA-C   3 months ago Paraplegia [facility])   [facility] [place], [place], PA-C   4 months ago Recurrent UTI   [facility] [place], [place], PA-C       Future Appointments             In 2 months Deirdre Evener, MD [facility]             amphetamine-dextroamphetamine (ADDERALL) 15 MG tablet [Pharmacy Med Name: AMPHETAMINE-DEXTROAMPHETAMINE 15 MG] 60 tablet     Sig: TAKE ONE (1) TABLET BY MOUTH TWO TIMES PER DAY     Not Delegated - Psychiatry:  Stimulants/ADHD Failed - 07/20/2022  8:21 AM      Failed - This refill cannot be delegated      Failed - Urine Drug Screen completed in last 360 days      Passed - Last BP in normal range    BP Readings from Last 1 Encounters:  07/10/22 118/79         Passed - Last Heart Rate in normal range    Pulse Readings from Last 1 Encounters:  07/10/22 (!) 103         Passed - Valid encounter within last 6 months     Recent Outpatient Visits           2 months ago Pelvic pain   [facility] Malva Limes, MD   3 months ago Muscle spasm of right lower extremity   [facility] Malva Limes, MD   3 months ago Chronic UTI   [facility] [place], [place], PA-C   3 months ago Paraplegia [facility])   [facility] [place], [place], PA-C   4 months ago Recurrent UTI   [facility] [place], [place], PA-C       Future Appointments             In 2 months Deirdre Evener, MD [facility]             methadone (DOLOPHINE) 10 MG tablet [Pharmacy Med Name: METHADONE HCL 10 MG TAB] 240 tablet     Sig: TAKE TWO TABLETS BY MOUTH EVERY 6 HOURS     Not Delegated -  Analgesics:  Opioid Agonists - methadone hydrochloride Failed - 07/20/2022  8:21 AM      Failed - This refill cannot be delegated      Failed - AST in normal range and within 360 days    AST  Date Value Ref Range Status  04/20/2022 48 (H) 0 - 40 IU/L Final   SGOT(AST)  Date Value Ref Range Status  06/25/2014 15 15 - 37 Unit/L Final         Failed - Urine Drug Screen completed in last 360 days      Passed - Cr in normal range and within 360 days    Creatinine  Date Value Ref Range Status  06/28/2014 0.82 0.60 - 1.30 mg/dL Final   Creatinine, Ser  Date Value Ref Range Status  04/20/2022 0.81 0.76 - 1.27 mg/dL Final         Passed - ALT in normal range and within 360 days    ALT  Date Value Ref Range Status  04/20/2022 40 0 - 44 IU/L Final   SGPT (ALT)  Date Value Ref Range Status  06/25/2014 18 U/L Final    Comment:    14-63 NOTE: New Reference Range 04/06/14          Passed - Valid encounter within last 3 months    Recent Outpatient Visits           2 months ago Pelvic pain   [place], MD   3 months ago Muscle spasm of right lower extremity   [facility] Birdie Sons, MD   3 months ago  Chronic UTI   [facility] [place], [place], PA-C   3 months ago Paraplegia [facility])   [facility] [place], [place], PA-C   4 months ago Recurrent UTI   [facility] [place], [place], PA-C       Future Appointments             In 2 months Ralene Bathe, MD [place]

## 2022-07-26 ENCOUNTER — Encounter: Payer: Self-pay | Admitting: Family Medicine

## 2022-07-31 ENCOUNTER — Other Ambulatory Visit: Payer: Self-pay | Admitting: Family Medicine

## 2022-07-31 DIAGNOSIS — G894 Chronic pain syndrome: Secondary | ICD-10-CM

## 2022-08-07 DIAGNOSIS — N3289 Other specified disorders of bladder: Secondary | ICD-10-CM | POA: Diagnosis not present

## 2022-08-07 DIAGNOSIS — R9341 Abnormal radiologic findings on diagnostic imaging of renal pelvis, ureter, or bladder: Secondary | ICD-10-CM | POA: Diagnosis not present

## 2022-08-07 DIAGNOSIS — N390 Urinary tract infection, site not specified: Secondary | ICD-10-CM | POA: Diagnosis not present

## 2022-08-07 DIAGNOSIS — N319 Neuromuscular dysfunction of bladder, unspecified: Secondary | ICD-10-CM | POA: Diagnosis not present

## 2022-08-13 ENCOUNTER — Encounter: Payer: Self-pay | Admitting: Physician Assistant

## 2022-08-14 ENCOUNTER — Other Ambulatory Visit: Payer: Self-pay | Admitting: Family Medicine

## 2022-08-14 DIAGNOSIS — G894 Chronic pain syndrome: Secondary | ICD-10-CM

## 2022-08-14 DIAGNOSIS — F988 Other specified behavioral and emotional disorders with onset usually occurring in childhood and adolescence: Secondary | ICD-10-CM

## 2022-08-15 ENCOUNTER — Telehealth: Payer: Self-pay

## 2022-08-15 ENCOUNTER — Other Ambulatory Visit: Payer: Self-pay | Admitting: Family Medicine

## 2022-08-15 DIAGNOSIS — G894 Chronic pain syndrome: Secondary | ICD-10-CM

## 2022-08-15 DIAGNOSIS — F988 Other specified behavioral and emotional disorders with onset usually occurring in childhood and adolescence: Secondary | ICD-10-CM

## 2022-08-15 NOTE — Telephone Encounter (Signed)
Copied from CRM [id number]. Topic: General - Other >> Aug 15, 2022  8:12 AM Lyman Speller wrote: Reason for CRM: pt urologist sent over an order for urinalysis and pt wants to know when it would be ok to drop it off / after 3pm /please advise

## 2022-08-15 NOTE — Telephone Encounter (Signed)
That's fine

## 2022-08-15 NOTE — Telephone Encounter (Signed)
Please advise 

## 2022-08-16 ENCOUNTER — Encounter: Payer: Self-pay | Admitting: *Deleted

## 2022-08-16 NOTE — Telephone Encounter (Signed)
Sent patient a Clinical cytogeneticist message advising below.

## 2022-08-16 NOTE — Telephone Encounter (Signed)
Tried calling pt back, no answer and no vm on either phone number. Okay for pec to advise patient he can drop off urine sample whenever the office is open. Thanks.

## 2022-08-17 ENCOUNTER — Other Ambulatory Visit (INDEPENDENT_AMBULATORY_CARE_PROVIDER_SITE_OTHER): Payer: Medicare HMO | Admitting: *Deleted

## 2022-08-17 DIAGNOSIS — N390 Urinary tract infection, site not specified: Secondary | ICD-10-CM

## 2022-08-17 LAB — POCT URINALYSIS DIPSTICK
Bilirubin, UA: NEGATIVE
Blood, UA: POSITIVE
Glucose, UA: NEGATIVE
Ketones, UA: NEGATIVE
Nitrite, UA: POSITIVE
Protein, UA: POSITIVE — AB
Spec Grav, UA: 1.025 (ref 1.010–1.025)
Urobilinogen, UA: 0.2 E.U./dL
pH, UA: 6 (ref 5.0–8.0)

## 2022-08-17 MED ORDER — AMPHETAMINE-DEXTROAMPHETAMINE 15 MG PO TABS: 15.0000 mg | ORAL_TABLET | Freq: Two times a day (BID) | ORAL | 0 refills | Status: DC

## 2022-08-17 MED ORDER — METHADONE HCL 10 MG PO TABS: 20.0000 mg | ORAL_TABLET | Freq: Four times a day (QID) | ORAL | 0 refills | Status: DC

## 2022-08-22 ENCOUNTER — Other Ambulatory Visit: Payer: Self-pay | Admitting: Family Medicine

## 2022-08-22 ENCOUNTER — Encounter: Payer: Self-pay | Admitting: Family Medicine

## 2022-08-22 DIAGNOSIS — G894 Chronic pain syndrome: Secondary | ICD-10-CM

## 2022-08-22 DIAGNOSIS — N390 Urinary tract infection, site not specified: Secondary | ICD-10-CM

## 2022-08-22 MED ORDER — CEFDINIR 300 MG PO CAPS: 600.0000 mg | ORAL_CAPSULE | Freq: Every day | ORAL | 0 refills | Status: AC

## 2022-08-23 ENCOUNTER — Encounter: Payer: Self-pay | Admitting: *Deleted

## 2022-08-23 DIAGNOSIS — R339 Retention of urine, unspecified: Secondary | ICD-10-CM | POA: Diagnosis not present

## 2022-08-23 NOTE — Telephone Encounter (Signed)
Requested medications are due for refill today.  yes  Requested medications are on the active medications list.  yes  Last refill. 07/31/2022 #120 0 rf  Future visit scheduled.   yes  Notes to clinic.  Refill not delegated.    Requested Prescriptions  Pending Prescriptions Disp Refills   oxyCODONE (ROXICODONE) 15 MG immediate release tablet [Pharmacy Med Name: OXYCODONE HCL 15 MG TAB] 120 tablet     Sig: TAKE 1 TABLET BY MOUTH EVERY 6 HOUR AS NEEDED FOR PAIN     Not Delegated - Analgesics:  Opioid Agonists Failed - 08/22/2022  6:51 PM      Failed - This refill cannot be delegated      Failed - Urine Drug Screen completed in last 360 days      Failed - Valid encounter within last 3 months    Recent Outpatient Visits           4 months ago Pelvic pain   [facility] Malva Limes, MD   4 months ago Muscle spasm of right lower extremity   [facility] Malva Limes, MD   4 months ago Chronic UTI   [facility] [place], [place], PA-C   4 months ago Paraplegia [facility])   [facility] [place], [place], PA-C   5 months ago Recurrent UTI   [facility] [place], [place], PA-C       Future Appointments             In 1 month Deirdre Evener, MD [facility]

## 2022-08-23 NOTE — Telephone Encounter (Signed)
[email or web address]  

## 2022-08-26 LAB — CULTURE, URINE COMPREHENSIVE

## 2022-08-26 LAB — SPECIMEN STATUS REPORT

## 2022-09-10 ENCOUNTER — Other Ambulatory Visit: Payer: Self-pay | Admitting: Family Medicine

## 2022-09-10 DIAGNOSIS — G894 Chronic pain syndrome: Secondary | ICD-10-CM

## 2022-09-10 DIAGNOSIS — F988 Other specified behavioral and emotional disorders with onset usually occurring in childhood and adolescence: Secondary | ICD-10-CM

## 2022-09-12 ENCOUNTER — Other Ambulatory Visit: Payer: Self-pay | Admitting: Family Medicine

## 2022-09-12 DIAGNOSIS — F988 Other specified behavioral and emotional disorders with onset usually occurring in childhood and adolescence: Secondary | ICD-10-CM

## 2022-09-12 DIAGNOSIS — G894 Chronic pain syndrome: Secondary | ICD-10-CM

## 2022-09-12 MED ORDER — AMPHETAMINE-DEXTROAMPHETAMINE 15 MG PO TABS: 15.0000 mg | ORAL_TABLET | Freq: Two times a day (BID) | ORAL | 0 refills | Status: DC

## 2022-09-12 MED ORDER — METHADONE HCL 10 MG PO TABS: 20.0000 mg | ORAL_TABLET | Freq: Four times a day (QID) | ORAL | 0 refills | Status: DC

## 2022-09-19 ENCOUNTER — Other Ambulatory Visit: Payer: Self-pay | Admitting: Family Medicine

## 2022-09-19 DIAGNOSIS — G894 Chronic pain syndrome: Secondary | ICD-10-CM

## 2022-09-19 NOTE — Telephone Encounter (Signed)
Requested medication (s) are due for refill today - provider review   Requested medication (s) are on the active medication list -yes  Future visit scheduled -no  Last refill: 08/25/22 #120  Notes to clinic: non delegated Rx  Requested Prescriptions  Pending Prescriptions Disp Refills   oxyCODONE (ROXICODONE) 15 MG immediate release tablet [Pharmacy Med Name: OXYCODONE HCL 15 MG TAB] 120 tablet     Sig: TAKE 1 TABLET BY MOUTH EVERY 6 HOUR AS NEEDED FOR PAIN     Not Delegated - Analgesics:  Opioid Agonists Failed - 09/19/2022 12:03 PM      Failed - This refill cannot be delegated      Failed - Urine Drug Screen completed in last 360 days      Failed - Valid encounter within last 3 months    Recent Outpatient Visits           4 months ago Pelvic pain   [facility] Birdie Sons, MD   5 months ago Muscle spasm of right lower extremity   [facility] Birdie Sons, MD   5 months ago Chronic UTI   [facility] [place], [place], PA-C   5 months ago Paraplegia [facility])   [facility] [place], [place], PA-C   6 months ago Recurrent UTI   [facility] [place], [place], PA-C       Future Appointments             In 1 week Ralene Bathe, MD [place]               Requested Prescriptions  Pending Prescriptions Disp Refills   oxyCODONE (ROXICODONE) 15 MG immediate release tablet [Pharmacy Med Name: OXYCODONE HCL 15 MG TAB] 120 tablet     Sig: TAKE 1 TABLET BY MOUTH EVERY 6 HOUR AS NEEDED FOR PAIN     Not Delegated - Analgesics:  Opioid Agonists Failed - 09/19/2022 12:03 PM      Failed - This refill cannot be delegated      Failed - Urine Drug Screen completed in last 360 days      Failed - Valid encounter within last 3 months    Recent Outpatient Visits           4 months ago Pelvic pain   [facility] Birdie Sons, MD   5 months ago Muscle spasm of right lower  extremity   [facility] Birdie Sons, MD   5 months ago Chronic UTI   [facility] [place], [place], PA-C   5 months ago Paraplegia [facility])   [facility] [place], [place], PA-C   6 months ago Recurrent UTI   [facility] [place], [place], PA-C       Future Appointments             In 1 week Ralene Bathe, MD [place]

## 2022-09-24 DIAGNOSIS — R339 Retention of urine, unspecified: Secondary | ICD-10-CM | POA: Diagnosis not present

## 2022-09-25 ENCOUNTER — Ambulatory Visit: Payer: Self-pay

## 2022-09-25 DIAGNOSIS — N390 Urinary tract infection, site not specified: Secondary | ICD-10-CM

## 2022-09-25 NOTE — Telephone Encounter (Signed)
  Chief Complaint: UTI - bladder spasms Symptoms: Cloudy, egg smelling urine. Painful bladder spams Frequency:  Pertinent Negatives: Patient denies  Disposition: [] ED /[] Urgent Care (no appt availability in office) / [] Appointment(In office/virtual)/ []  [place] [facility]/ [] Home Care/ [] Refused Recommended Disposition /[] [place] [facility]/ [x]  Follow-up with PCP Additional Notes: Pt believes he has another UTI. PT would like to have his wife bring by a urine sample for testing.  Pt also states that bladder spasms are very painful. His voice and legs are  shaking d/t the spasms.  Pt needs help with these.  Also pharmacy states that he cannot get his pain medication until tomorrow 09/26/2022. It appears that Dr. Kathlene November refilled the medication 09/21/2022. Unsure why pharmacy will not give med to pt.   Pt is also afraid that he is needing more pain medication. It used to be that he would have a little left over at the end of the refill. Now pt is needing pain meds right on time or before  refill is due.  Please advise Pt.    Reason for Disposition  All other urine symptoms  Answer Assessment - Initial Assessment Questions 1. SYMPTOM: "What's the main symptom you're concerned about?" (e.g., frequency, incontinence)     Pain, bladder spasms 2. ONSET: "When did the    start?"      3. PAIN: "Is there any pain?" If Yes, ask: "How bad is it?" (Scale: 1-10; mild, moderate, severe)     yes 4. CAUSE: "What do you think is causing the symptoms?"     UTI - spasms 5. OTHER SYMPTOMS: "Do you have any other symptoms?" (e.g., blood in urine, fever, flank pain, pain with urination)     Egg smell, cloudy urine 6. PREGNANCY: "Is there any chance you are pregnant?" "When was your last menstrual period?"  Protocols used: Urinary Symptoms-A-AH

## 2022-09-26 ENCOUNTER — Ambulatory Visit: Payer: Medicare HMO | Admitting: Dermatology

## 2022-09-26 ENCOUNTER — Other Ambulatory Visit (INDEPENDENT_AMBULATORY_CARE_PROVIDER_SITE_OTHER): Payer: Medicare HMO

## 2022-09-26 DIAGNOSIS — N390 Urinary tract infection, site not specified: Secondary | ICD-10-CM

## 2022-09-26 LAB — POCT URINALYSIS DIPSTICK
Bilirubin, UA: NEGATIVE
Glucose, UA: NEGATIVE
Ketones, UA: NEGATIVE
Nitrite, UA: NEGATIVE
Protein, UA: NEGATIVE
Spec Grav, UA: 1.015 (ref 1.010–1.025)
Urobilinogen, UA: 0.2 E.U./dL
pH, UA: 7 (ref 5.0–8.0)

## 2022-09-26 NOTE — Telephone Encounter (Signed)
OK to leave sample, but he is supposed to be seeing urologist about his recurrent UTIs. What's the status of that

## 2022-09-26 NOTE — Telephone Encounter (Signed)
Patient advised to drop off urine sample. I also advised that he needs to fu with urology. To call them to schedule.

## 2022-09-26 NOTE — Addendum Note (Signed)
Addended by: Shawna Orleans on: 09/26/2022 04:57 PM   Modules accepted: Orders

## 2022-09-27 LAB — URINALYSIS, MICROSCOPIC ONLY
Bacteria, UA: NONE SEEN
Casts: NONE SEEN /lpf
Epithelial Cells (non renal): NONE SEEN /hpf (ref 0–10)
WBC, UA: >30 /hpf — AB (ref 0–5)

## 2022-09-28 LAB — URINE CULTURE

## 2022-09-29 ENCOUNTER — Other Ambulatory Visit: Payer: Self-pay | Admitting: Family Medicine

## 2022-09-29 DIAGNOSIS — N390 Urinary tract infection, site not specified: Secondary | ICD-10-CM

## 2022-09-29 MED ORDER — CEFDINIR 300 MG PO CAPS: 600.0000 mg | ORAL_CAPSULE | Freq: Every day | ORAL | 0 refills | Status: DC

## 2022-10-01 ENCOUNTER — Ambulatory Visit: Payer: Medicare HMO | Admitting: Dermatology

## 2022-10-02 ENCOUNTER — Inpatient Hospital Stay
Admission: EM | Admit: 2022-10-02 | Discharge: 2022-10-05 | DRG: 690 | Disposition: A | Discharge status: Home / Self Care | Payer: Medicare HMO | Attending: Internal Medicine | Admitting: Internal Medicine

## 2022-10-02 ENCOUNTER — Ambulatory Visit (INDEPENDENT_AMBULATORY_CARE_PROVIDER_SITE_OTHER): Payer: Medicare HMO | Admitting: Physician Assistant

## 2022-10-02 ENCOUNTER — Encounter: Payer: Self-pay | Admitting: Physician Assistant

## 2022-10-02 VITALS — BP 160/90 | HR 130 | Temp 98.2°F

## 2022-10-02 DIAGNOSIS — Z87891 Personal history of nicotine dependence: Secondary | ICD-10-CM

## 2022-10-02 DIAGNOSIS — Z79890 Hormone replacement therapy (postmenopausal): Secondary | ICD-10-CM

## 2022-10-02 DIAGNOSIS — Z881 Allergy status to other antibiotic agents status: Secondary | ICD-10-CM

## 2022-10-02 DIAGNOSIS — R000 Tachycardia, unspecified: Secondary | ICD-10-CM | POA: Diagnosis not present

## 2022-10-02 DIAGNOSIS — J45909 Unspecified asthma, uncomplicated: Secondary | ICD-10-CM | POA: Diagnosis present

## 2022-10-02 DIAGNOSIS — N390 Urinary tract infection, site not specified: Secondary | ICD-10-CM | POA: Diagnosis not present

## 2022-10-02 DIAGNOSIS — Z79899 Other long term (current) drug therapy: Secondary | ICD-10-CM

## 2022-10-02 DIAGNOSIS — Z1152 Encounter for screening for COVID-19: Secondary | ICD-10-CM

## 2022-10-02 DIAGNOSIS — Z885 Allergy status to narcotic agent status: Secondary | ICD-10-CM

## 2022-10-02 DIAGNOSIS — F419 Anxiety disorder, unspecified: Secondary | ICD-10-CM | POA: Diagnosis present

## 2022-10-02 DIAGNOSIS — M62838 Other muscle spasm: Secondary | ICD-10-CM | POA: Diagnosis not present

## 2022-10-02 DIAGNOSIS — R252 Cramp and spasm: Secondary | ICD-10-CM | POA: Diagnosis not present

## 2022-10-02 DIAGNOSIS — G894 Chronic pain syndrome: Secondary | ICD-10-CM | POA: Diagnosis present

## 2022-10-02 DIAGNOSIS — S24104S Unspecified injury at T11-T12 level of thoracic spinal cord, sequela: Secondary | ICD-10-CM

## 2022-10-02 DIAGNOSIS — K5903 Drug induced constipation: Secondary | ICD-10-CM | POA: Diagnosis present

## 2022-10-02 DIAGNOSIS — G8220 Paraplegia, unspecified: Secondary | ICD-10-CM | POA: Diagnosis present

## 2022-10-02 DIAGNOSIS — Z888 Allergy status to other drugs, medicaments and biological substances status: Secondary | ICD-10-CM

## 2022-10-02 DIAGNOSIS — Z960 Presence of urogenital implants: Secondary | ICD-10-CM | POA: Diagnosis present

## 2022-10-02 DIAGNOSIS — T40605A Adverse effect of unspecified narcotics, initial encounter: Secondary | ICD-10-CM | POA: Diagnosis present

## 2022-10-02 DIAGNOSIS — N319 Neuromuscular dysfunction of bladder, unspecified: Secondary | ICD-10-CM | POA: Diagnosis not present

## 2022-10-02 DIAGNOSIS — F32A Depression, unspecified: Secondary | ICD-10-CM | POA: Diagnosis present

## 2022-10-02 DIAGNOSIS — Z9109 Other allergy status, other than to drugs and biological substances: Secondary | ICD-10-CM

## 2022-10-02 DIAGNOSIS — G904 Autonomic dysreflexia: Secondary | ICD-10-CM | POA: Diagnosis present

## 2022-10-02 DIAGNOSIS — Z8249 Family history of ischemic heart disease and other diseases of the circulatory system: Secondary | ICD-10-CM

## 2022-10-02 DIAGNOSIS — B9620 Unspecified Escherichia coli [E. coli] as the cause of diseases classified elsewhere: Secondary | ICD-10-CM | POA: Diagnosis present

## 2022-10-02 LAB — CBC WITH DIFFERENTIAL/PLATELET
Abs Immature Granulocytes: 0.02 10*3/uL (ref 0.00–0.07)
Basophils Absolute: 0 10*3/uL (ref 0.0–0.1)
Basophils Relative: 0 %
Eosinophils Absolute: 0.1 10*3/uL (ref 0.0–0.5)
Eosinophils Relative: 1 %
HCT: 43.4 % (ref 39.0–52.0)
Hemoglobin: 14.2 g/dL (ref 13.0–17.0)
Immature Granulocytes: 0 %
Lymphocytes Relative: 15 %
Lymphs Abs: 1 10*3/uL (ref 0.7–4.0)
MCH: 27.6 pg (ref 26.0–34.0)
MCHC: 32.7 g/dL (ref 30.0–36.0)
MCV: 84.4 fL (ref 80.0–100.0)
Monocytes Absolute: 0.5 10*3/uL (ref 0.1–1.0)
Monocytes Relative: 7 %
Neutro Abs: 5.2 10*3/uL (ref 1.7–7.7)
Neutrophils Relative %: 77 %
Platelets: 182 10*3/uL (ref 150–400)
RBC: 5.14 MIL/uL (ref 4.22–5.81)
RDW: 14.3 % (ref 11.5–15.5)
WBC: 6.8 10*3/uL (ref 4.0–10.5)
nRBC: 0 % (ref 0.0–0.2)

## 2022-10-02 LAB — URINALYSIS, ROUTINE W REFLEX MICROSCOPIC
Bacteria, UA: NONE SEEN
Bilirubin Urine: NEGATIVE
Glucose, UA: NEGATIVE mg/dL
Hgb urine dipstick: NEGATIVE
Ketones, ur: 20 mg/dL — AB
Nitrite: NEGATIVE
Protein, ur: 30 mg/dL — AB
Specific Gravity, Urine: 1.024 (ref 1.005–1.030)
WBC, UA: >50 WBC/hpf — ABNORMAL HIGH (ref 0–5)
pH: 5 (ref 5.0–8.0)

## 2022-10-02 LAB — CK: Total CK: 198 U/L (ref 49–397)

## 2022-10-02 LAB — COMPREHENSIVE METABOLIC PANEL
ALT: 23 U/L (ref 0–44)
AST: 27 U/L (ref 15–41)
Albumin: 4.1 g/dL (ref 3.5–5.0)
Alkaline Phosphatase: 63 U/L (ref 38–126)
Anion gap: 11 (ref 5–15)
BUN: 22 mg/dL — ABNORMAL HIGH (ref 6–20)
CO2: 21 mmol/L — ABNORMAL LOW (ref 22–32)
Calcium: 9.2 mg/dL (ref 8.9–10.3)
Chloride: 107 mmol/L (ref 98–111)
Creatinine, Ser: 0.83 mg/dL (ref 0.61–1.24)
GFR, Estimated: >60 mL/min (ref >60)
Glucose, Bld: 107 mg/dL — ABNORMAL HIGH (ref 70–99)
Potassium: 3.8 mmol/L (ref 3.5–5.1)
Sodium: 139 mmol/L (ref 135–145)
Total Bilirubin: 0.8 mg/dL (ref 0.3–1.2)
Total Protein: 7.2 g/dL (ref 6.5–8.1)

## 2022-10-02 LAB — PROCALCITONIN: Procalcitonin: <0.1 ng/mL

## 2022-10-02 LAB — MAGNESIUM: Magnesium: 1.8 mg/dL (ref 1.7–2.4)

## 2022-10-02 MED ORDER — HYDROMORPHONE HCL 1 MG/ML IJ SOLN
0.5000 mg | Freq: Once | INTRAMUSCULAR | Status: AC
  Administered 2022-10-02: 0.5 mg via INTRAVENOUS
  Filled 2022-10-02: qty 0.5

## 2022-10-02 MED ORDER — SODIUM CHLORIDE 0.9 % IV BOLUS
1000.0000 mL | Freq: Once | INTRAVENOUS | Status: AC
  Administered 2022-10-02: 1000 mL via INTRAVENOUS

## 2022-10-02 MED ORDER — ALPRAZOLAM 0.5 MG PO TABS
2.0000 mg | ORAL_TABLET | Freq: Once | ORAL | Status: AC
  Administered 2022-10-02: 2 mg via ORAL
  Filled 2022-10-02: qty 4

## 2022-10-02 MED ORDER — HYDROMORPHONE HCL 1 MG/ML IJ SOLN
1.0000 mg | Freq: Once | INTRAMUSCULAR | Status: AC
  Administered 2022-10-02: 1 mg via INTRAVENOUS
  Filled 2022-10-02: qty 1

## 2022-10-02 MED ORDER — SODIUM CHLORIDE 0.9 % IV SOLN
2.0000 g | Freq: Once | INTRAVENOUS | Status: AC
  Administered 2022-10-02: 2 g via INTRAVENOUS
  Filled 2022-10-02: qty 12.5

## 2022-10-02 MED ORDER — BACLOFEN 10 MG PO TABS
20.0000 mg | ORAL_TABLET | Freq: Once | ORAL | Status: AC
  Administered 2022-10-02: 20 mg via ORAL
  Filled 2022-10-02: qty 2

## 2022-10-02 NOTE — Consult Note (Signed)
PHARMACY -  BRIEF ANTIBIOTIC NOTE   Pharmacy has received consult(s) for cefepime from an ED provider.  The patient's profile has been reviewed for ht/wt/allergies/indication/available labs.    One time order(s) placed for Cefepime 2g IV x1  Further antibiotics/pharmacy consults should be ordered by admitting physician if indicated.                       Thank you, Darrick Penna 10/02/2022  8:01 PM

## 2022-10-02 NOTE — ED Notes (Signed)
PA student at bedside at this time 

## 2022-10-02 NOTE — Assessment & Plan Note (Addendum)
Per patient, he has frequent muscle spasms in the setting of underlying infections.  Although paraplegic, he can feel when these muscle spasms are occurring and are significantly painful.  At home, patient is on baclofen 20 mg 4 times daily and tizanidine 8 mg 4 times daily.  This has not improved his pain at this time.  Per EDP, muscle spasms occurred frequently during his examination.  - Continue home baclofen, tizanidine, gabapentin, Xanax, methadone and oxycodone - Trial of metaxalone - Dilaudid as needed for breakthrough pain - Encourage patient to follow-up with neurosurgeon for consideration of a spine stimulator and further evaluation of increasing muscle spasticity.

## 2022-10-02 NOTE — Assessment & Plan Note (Signed)
In the setting of uncontrolled pain, muscle spasms and underlying UTI.  No electrolyte abnormalities to explain tachycardia.  He has improved with IV hydration, however heart rate spikes back up muscle spasms occur.  - Management of UTI and muscle spasms as noted below - Telemetry monitoring overnight to ensure improvement

## 2022-10-02 NOTE — H&P (Signed)
History and Physical    Patient: Justin [name] NWG:[id number] DOB: [birth year] DOA: 10/02/2022 DOS: the patient was seen and examined on 10/03/2022 PCP: Birdie Sons, MD  Patient coming from: Home  Chief Complaint:  Chief Complaint  Patient presents with   Spasms   HPI: Justin [name] is a 42 y.o. male with medical history significant of paraplegia with neurogenic bladder, frequent UTIs, chronic pain syndrome, anxiety/depression, asthma who presents to the ED due to muscle spasms and high heart rate.  Justin [name] states that Justin [name] has been experiencing frequent muscle spasms since developing his first UTI approximately 3 months ago.  Since then, Justin [name] has noticed that muscle spasms become more severe and more frequent when experiencing underlying infection or inflammation.  Justin [name] describes the muscle spasms as shooting down his legs and feel as though his legs are on fire.  Justin [name] has not noticed any skin changes with muscle spasms occur.  In the past couple days, his spasms have become significantly worse since developing a UTI once again. Justin [name] denies any palpitations or chest pain.  Per chart review, patient has frequent UTIs and has established with [facility] urology with plans for urodynamic testing.  Justin [name] started a course of cefdinir on 12/6 after urine culture grew E. coli and Enterococcus faecalis.  On 1/09, patient contacted his PCP for bladder pain and muscle spasms, which is his typical symptoms of UTI.  Urine culture was obtained on 1/10 that grew E. coli.  Cefdinir was sent to the pharmacy on 1/13 and Justin [name] started it on 1/15.  Today, Justin [name] followed up with his PCP, at which time his heart rate was found to be elevated at 130.  Due to this, Justin [name] was sent to the ED.  ED course: On arrival to the ED, patient was hypertensive at 151/86 with heart rate of 136.  Justin [name] was saturating at 98% on room air.  EKG demonstrates sinus Tachycardia. Initial workup with potassium of 3.8, magnesium of 1.8,  bicarb of 21, BUN 22, creatinine 0.83, WBC of 6.8, hemoglobin of 14.2, CK1 98 and procalcitonin negative.  Urinalysis was obtained that demonstrated ketonuria, large leukocytes, proteinuria, and over 50 WBCs/hpf.  Due to uncontrolled pain with muscle spasms, [facility] contacted for admission.  Review of Systems: As mentioned in the history of present illness. All other systems reviewed and are negative. Past Medical History:  Diagnosis Date   Arthritis    Asthma    Chronic anxiety    Chronic depression    Chronic pain syndrome    secondary to fracture T9 and L3 with extensive spinal surgeery   Postoperative wound infection of right hip 2012   Rheumatic fever    Past Surgical History:  Procedure Laterality Date   BACK SURGERY  2000   Fracture T9 and L3 with paraplegia   Social History:  reports that Justin [name] has quit smoking. Justin [name] has been exposed to tobacco smoke. Justin [name] has never used smokeless tobacco. Justin [name] reports current alcohol use. Justin [name] reports that Justin [name] does not use drugs.  Allergies  Allergen Reactions   Morphine     psychosis   Azithromycin     throat closes   Iodine    Lorazepam     extremely hostile   Meperidine    Morphine Sulfate     Psychotic symptoms    Family History  Problem Relation Age of Onset   Other Mother    Hypertension Father    Gout Father    Prostate cancer  Neg Hx    Kidney cancer Neg Hx     Prior to Admission medications   Medication Sig Start Date End Date Taking? Authorizing Provider  albuterol (VENTOLIN HFA) 108 (90 Base) MCG/ACT inhaler Inhale 2 puffs into the lungs every 6 (six) hours as needed for wheezing or shortness of breath. 10/03/21   Malva Limes, MD  alprazolam Prudy Feeler) 2 MG tablet TAKE ONE TABLET EVERY SIX HOURS IF NEEDED 06/22/22   Malva Limes, MD  amphetamine-dextroamphetamine (ADDERALL) 15 MG tablet Take 1 tablet by mouth 2 (two) times daily. 09/12/22   Malva Limes, MD  azelastine (OPTIVAR) 0.05 % ophthalmic solution Place 1 drop into  both eyes 2 (two) times daily. 03/19/22   Ostwalt, Edmon Crape, PA-C  baclofen (LIORESAL) 20 MG tablet TAKE ONE TABLET FOUR TIMES DAILY 01/26/22   Malva Limes, MD  cefdinir (OMNICEF) 300 MG capsule Take 2 capsules (600 mg total) by mouth daily for 10 days. 09/29/22 10/09/22  Malva Limes, MD  clotrimazole (LOTRIMIN) 1 % cream Apply 1 application topically 2 (two) times daily. 11/02/21   Malva Limes, MD  clotrimazole-betamethasone (LOTRISONE) cream Apply 1 application topically 2 (two) times daily. 07/14/19   Chrismon, Jodell Cipro, PA-C  diphenhydrAMINE (BENADRYL) 50 MG capsule Take 1 capsule 1 hour before procedure 05/22/22   Malva Limes, MD  docusate sodium (COLACE) 250 MG capsule Take 1 capsule (250 mg total) by mouth 4 (four) times daily as needed. 08/10/15   Malva Limes, MD  dronabinol (MARINOL) 10 MG capsule Take 1 capsule (10 mg total) by mouth every 6 (six) hours as needed. 04/18/16   Malva Limes, MD  fesoterodine (TOVIAZ) 8 MG TB24 tablet TAKE 1 TABLET BY MOUTH DAILY 05/27/22   Malva Limes, MD  flavoxATE (URISPAS) 100 MG tablet Take 1 tablet (100 mg total) by mouth 3 (three) times daily as needed for bladder spasms. 08/22/20   Chrismon, Jodell Cipro, PA-C  fluticasone (FLONASE) 50 MCG/ACT nasal spray Place 2 sprays into both nostrils daily. 07/04/21   Muthersbaugh, Dahlia Client, PA-C  gabapentin (NEURONTIN) 800 MG tablet Take 1 tablet (800 mg total) by mouth 4 (four) times daily. 11/30/15   Malva Limes, MD  ketoconazole (NIZORAL) 2 % cream Apply 1 Application topically 2 (two) times daily. 05/22/22   Malva Limes, MD  levothyroxine (SYNTHROID) 88 MCG tablet Take 1 tablet (88 mcg total) by mouth daily. 11/29/21   Malva Limes, MD  lidocaine (LMX) 4 % cream Apply 1 Application topically 3 (three) times daily. 04/18/22   Debera Lat, PA-C  linaclotide (LINZESS) 145 MCG CAPS capsule Take 1 capsule (145 mcg total) by mouth daily before breakfast. 10/03/21   Malva Limes, MD   methadone (DOLOPHINE) 10 MG tablet Take 2 tablets (20 mg total) by mouth every 6 (six) hours. 09/12/22   Malva Limes, MD  Mineral Oil OIL daily.    [provider]  naproxen sodium (ANAPROX) 550 MG tablet TAKE 1 TABLET BY MOUTH 2 TIMES DAILY WITH A MEAL. 04/25/22   Malva Limes, MD  omeprazole (PRILOSEC) 20 MG capsule TAKE 1 CAPSULE BY MOUTH ONCE DAILY 07/19/22   Malva Limes, MD  oxybutynin (DITROPAN) 5 MG tablet Take 5 mg by mouth daily as needed. 11/27/21   [provider]  oxyCODONE (ROXICODONE) 15 MG immediate release tablet TAKE 1 TABLET BY MOUTH EVERY 6 HOUR AS NEEDED FOR PAIN 09/21/22   Malva Limes, MD  phenazopyridine (PYRIDIUM) 200 MG tablet Take 1 tablet (200 mg total) by mouth 3 (three) times daily. 09/17/19   Chrismon, Simona Huh E, PA-C  polyethylene glycol powder (QC NATURA-LAX) 17 GM/SCOOP powder MIX 1 CAPFUL IN 8 0Z OF WATER OR JUICE AND DRINK ONCE A DAY 12/25/21   Birdie Sons, MD  promethazine (PHENERGAN) 25 MG suppository PLACE 1 SUPPOSITORY RECTALLY EVERY 6 HOURS AS NEEDED FOR NAUSEA OR VOMITING 05/27/22   Birdie Sons, MD  promethazine (PHENERGAN) 25 MG tablet TAKE 1 TABLET BY MOUTH EVERY 4 HOURS AS NEEDED FOR NAUSEA AND VOMITING 09/12/22   Birdie Sons, MD  sildenafil (REVATIO) 20 MG tablet Take 3 to 5 tablets two hours before intercouse on an empty stomach.  Do not take with nitrates. 06/26/22   Birdie Sons, MD  silver sulfADIAZINE (SSD) 1 % cream APPLY TO AFFECTED AREAS TOPICALLY TWICE DAILY 03/28/21   Birdie Sons, MD  tiZANidine (ZANAFLEX) 4 MG tablet TAKE 2 TABLETS BY MOUTH 4 TIMES DAILY 07/20/22   Birdie Sons, MD    Physical Exam: Vitals:   10/02/22 1817 10/02/22 1920 10/02/22 2110 10/02/22 2200  BP: (!) 149/103 130/80 (!) 145/103 (!) 156/85  Pulse: (!) 132 (!) 148 (!) 110 95  Resp: 18 (!) 22 (!) 24 (!) 22  Temp: 98.9 F (37.2 C) 98.8 F (37.1 C)  98.7 F (37.1 C)  TempSrc: Oral Oral  Oral  SpO2: 95% 94% 97% 95%    Physical Exam Vitals and nursing note reviewed.  Constitutional:      General: Justin [name] is not in acute distress.    Appearance: Justin [name] is normal weight. Justin [name] is not toxic-appearing.  HENT:     Head: Normocephalic and atraumatic.     Mouth/Throat:     Mouth: Mucous membranes are moist.     Pharynx: Oropharynx is clear.  Eyes:     Conjunctiva/sclera: Conjunctivae normal.     Pupils: Pupils are equal, round, and reactive to light.  Cardiovascular:     Rate and Rhythm: Regular rhythm. Tachycardia present.     Heart sounds: No murmur heard.    No gallop.  Pulmonary:     Effort: Pulmonary effort is normal. No respiratory distress.     Breath sounds: Normal breath sounds.  Musculoskeletal:     Right lower leg: No edema.     Left lower leg: No edema.     Comments: No muscle spasms during examination.  Skin:    General: Skin is warm and dry.  Neurological:     Mental Status: Justin [name] is alert and oriented to person, place, and time. Mental status is at baseline.  Psychiatric:        Mood and Affect: Mood normal.        Behavior: Behavior normal.    Data Reviewed: CBC with WBC of 6.8, hemoglobin of 14.2, platelets of 182 CMP with sodium of 139, potassium of 3.8, chloride of 107, bicarb 21, glucose 107, BUN 22, creatinine 0.83, calcium 9.2, anion gap 11, AST 27, ALT 23, and GFR over 60 Magnesium within normal limits at 1.8 CK within normal limits at 198 Procalcitonin negative at less than 0.1 Urinalysis with ketonuria, large leukocytes, proteinuria, no bacteria and over 50 WBCs/hpf.  Urine culture obtained on 09/26/2022 with E. coli that is sensitive to ceftriaxone and cefepime.  EKG personally reviewed.  Sinus rhythm with rate of 135.  No ST or T wave changes to suggest acute ischemia.  There are no  new results to review at this time.  Assessment and Plan: * Sinus tachycardia In the setting of uncontrolled pain, muscle spasms and underlying UTI.  No electrolyte abnormalities to explain  tachycardia.  Justin [name] has improved with IV hydration, however heart rate spikes back up muscle spasms occur.  - Management of UTI and muscle spasms as noted below - Telemetry monitoring overnight to ensure improvement  Muscle spasticity Per patient, Justin [name] has frequent muscle spasms in the setting of underlying infections.  Although paraplegic, Justin [name] can feel when these muscle spasms are occurring and are significantly painful.  At home, patient is on baclofen 20 mg 4 times daily and tizanidine 8 mg 4 times daily.  This has not improved his pain at this time.  Per EDP, muscle spasms occurred frequently during his examination.  - Continue home baclofen, tizanidine, gabapentin, Xanax, methadone and oxycodone - Trial of metaxalone - Dilaudid as needed for breakthrough pain - Encourage patient to follow-up with neurosurgeon for consideration of a spine stimulator and further evaluation of increasing muscle spasticity.  Recurrent UTI Urine culture with E. coli that is sensitive to ceftriaxone.  Justin [name] has received cefepime in the ED but will de-escalate at this time.  - Start ceftriaxone 2 g daily - Transition to oral medicine on discharge - Encouraged follow-up with [facility] urology  Bladder neurogenesis - Continue intermittent self cath  Advance Care Planning:   Code Status: Full Code   Consults: None  Family Communication: Patient's wife updated at bedside  Severity of Illness: The appropriate patient status for this patient is OBSERVATION. Observation status is judged to be reasonable and necessary in order to provide the required intensity of service to ensure the patient's safety. The patient's presenting symptoms, physical exam findings, and initial radiographic and laboratory data in the context of their medical condition is felt to place them at decreased risk for further clinical deterioration. Furthermore, it is anticipated that the patient will be medically stable for discharge from the hospital  within 2 midnights of admission.   Author: Verdene Lennert, MD 10/03/2022 12:21 AM  For on call review [email or web address].

## 2022-10-02 NOTE — ED Notes (Signed)
Dr. Myrene Buddy in the room directed this nurse to reset the upper limit of the cardiac alarm to 150 due to patient anxiety.  He says that the patient had been running 135-145bpm on the monitor, and there was no concern at this point.

## 2022-10-02 NOTE — Assessment & Plan Note (Signed)
-  Continue intermittent self cath

## 2022-10-02 NOTE — ED Notes (Signed)
Patient performing self-catheterization for urine collection.

## 2022-10-02 NOTE — Assessment & Plan Note (Signed)
Urine culture with E. coli that is sensitive to ceftriaxone.  He has received cefepime in the ED but will de-escalate at this time.  - Start ceftriaxone 2 g daily - Transition to oral medicine on discharge - Encouraged follow-up with [facility] urology

## 2022-10-02 NOTE — ED Triage Notes (Addendum)
Pt presents to the ED via POV due to leg spasms that's been getting worse over the last month. Pt is a paraplegic and states whenever he get leg spasms, he knows when something wrong. Pt self cath and believes he has a UTI but currently being treated with Keflex. Pt A&Ox4

## 2022-10-02 NOTE — ED Provider Notes (Signed)
[facility] Provider Note    Event Date/Time   First MD Initiated Contact with Patient 10/02/22 1902     (approximate)   History   Spasms   HPI  Justin [name] is a 42 y.o. male  paraplegic male here with leg spasms. Reports nearly daily leg spasms for past few months, states he had a UTI at that time which he thought was due to a cystoscopy. He has been having recurrent UTIs for the last several months. Reports spasms have gotten worse over the past week or so. He has had severe, bilateral leg spasms and pain. Reports he has significant anxiety about being here. Denies known fevers. No cough.       Physical Exam   Triage Vital Signs: ED Triage Vitals  Enc Vitals Group     BP 10/02/22 1817 (!) 149/103     Pulse Rate 10/02/22 1817 (!) 132     Resp 10/02/22 1817 18     Temp 10/02/22 1817 98.9 F (37.2 C)     Temp Source 10/02/22 1817 Oral     SpO2 10/02/22 1817 95 %     Weight --      Height --      Head Circumference --      Peak Flow --      Pain Score 10/02/22 1814 8     Pain Loc --      Pain Edu? --      Excl. in [place]? --     Most recent vital signs: Vitals:   10/02/22 2200 10/03/22 0045  BP: (!) 156/85 107/82  Pulse: 95 (!) 105  Resp: (!) 22 (!) 24  Temp: 98.7 F (37.1 C) 98.6 F (37 C)  SpO2: 95% 97%     General: Awake, no distress. Anxious. CV:  Good peripheral perfusion. Tachycardic but well perfused. Resp:  Normal effort. Tachypneic.  Abd:  No distention. No tenderness. Other:  Appears anxious and uncomfortable.   ED Results / Procedures / Treatments   Labs (all labs ordered are listed, but only abnormal results are displayed) Labs Reviewed  COMPREHENSIVE METABOLIC PANEL - Abnormal; Notable for the following components:      Result Value   CO2 21 (*)    Glucose, Bld 107 (*)    BUN 22 (*)    All other components within normal limits  URINALYSIS, ROUTINE W REFLEX MICROSCOPIC - Abnormal; Notable for the  following components:   Color, Urine YELLOW (*)    APPearance HAZY (*)    Ketones, ur 20 (*)    Protein, ur 30 (*)    Leukocytes,Ua LARGE (*)    WBC, UA >50 (*)    All other components within normal limits  URINE CULTURE  CBC WITH DIFFERENTIAL/PLATELET  MAGNESIUM  CK  PROCALCITONIN  HIV ANTIBODY (ROUTINE TESTING W REFLEX)     EKG Sinus tachycardia, VR 135. PR 138, QRS 90, QTc 439. No acute ST elevations. RAE, Left axis deviation.   RADIOLOGY    I also independently reviewed and agree with radiologist interpretations.   PROCEDURES:  Critical Care performed: No    MEDICATIONS ORDERED IN ED: Medications  enoxaparin (LOVENOX) injection 40 mg (has no administration in time range)  sodium chloride flush (NS) 0.9 % injection 3 mL (3 mLs Intravenous Given 10/03/22 0110)  acetaminophen (TYLENOL) tablet 650 mg (has no administration in time range)    Or  acetaminophen (TYLENOL) suppository 650 mg (has no administration in  time range)  ALPRAZolam (XANAX) tablet 2 mg (has no administration in time range)  ketotifen (ZADITOR) 0.035 % ophthalmic solution 1 drop (has no administration in time range)  baclofen (LIORESAL) tablet 20 mg (20 mg Oral Given 10/03/22 0108)  docusate sodium (COLACE) capsule 200 mg (has no administration in time range)  fesoterodine (TOVIAZ) tablet 8 mg (has no administration in time range)  flavoxATE (URISPAS) tablet 100 mg (has no administration in time range)  gabapentin (NEURONTIN) capsule 800 mg (has no administration in time range)  levothyroxine (SYNTHROID) tablet 88 mcg (has no administration in time range)  linaclotide (LINZESS) capsule 145 mcg (has no administration in time range)  methadone (DOLOPHINE) tablet 20 mg (20 mg Oral Given 10/03/22 0107)  pantoprazole (PROTONIX) EC tablet 40 mg (has no administration in time range)  oxyCODONE (Oxy IR/ROXICODONE) immediate release tablet 15 mg (has no administration in time range)  phenazopyridine  (PYRIDIUM) tablet 200 mg (has no administration in time range)  tiZANidine (ZANAFLEX) tablet 8 mg (8 mg Oral Given 10/03/22 0108)  HYDROmorphone (DILAUDID) injection 1 mg (has no administration in time range)  cefTRIAXone (ROCEPHIN) 2 g in sodium chloride 0.9 % 100 mL IVPB (has no administration in time range)  metaxalone (SKELAXIN) tablet 400 mg (has no administration in time range)    Followed by  metaxalone (SKELAXIN) tablet 400 mg (has no administration in time range)  sodium chloride 0.9 % bolus 1,000 mL (0 mLs Intravenous Stopped 10/02/22 2218)  sodium chloride 0.9 % bolus 1,000 mL (0 mLs Intravenous Stopped 10/02/22 2218)  ceFEPIme (MAXIPIME) 2 g in sodium chloride 0.9 % 100 mL IVPB (0 g Intravenous Stopped 10/02/22 2150)  HYDROmorphone (DILAUDID) injection 1 mg (1 mg Intravenous Given 10/02/22 2156)  baclofen (LIORESAL) tablet 20 mg (20 mg Oral Given 10/02/22 2156)  HYDROmorphone (DILAUDID) injection 0.5 mg (0.5 mg Intravenous Given 10/02/22 2248)  ALPRAZolam (XANAX) tablet 2 mg (2 mg Oral Given 10/02/22 2248)     IMPRESSION / MDM / ASSESSMENT AND PLAN / ED COURSE  I reviewed the triage vital signs and the nursing notes.                              Differential diagnosis includes, but is not limited to, muscle spasm/myoclonus 2/2 his paraplegia, worsened in setting of dehydration and infection, rhabdo, lumbar radiculopathy, electrolyte abnormality.  Patient's presentation is most consistent with acute presentation with potential threat to life or bodily function.  The patient is on the cardiac monitor to evaluate for evidence of arrhythmia and/or significant heart rate changes.  42 yo M with h/o paraplegia, recurrent UTIs here with bilateral leg spasms/pain. Suspect acute on chronic spasticity in setting of relative dehydration and UTI, with significant pyuria, hematuria on UA. Pt tachycardic, in obvious discomfort on arrival - improved with IVF. Labs otherwise overall reassuring. No  signs of severe sepsis. Pt given IVF, analgesia, antispasmodics w/o significant relief - will admit for sx control and treatment of likely UTI. Reviewed prior cultures, which show E. Coli sensitive to Cepahalosporins.    FINAL CLINICAL IMPRESSION(S) / ED DIAGNOSES   Final diagnoses:  Complicated UTI (urinary tract infection)  Muscle spasms of both lower extremities     Rx / DC Orders   ED Discharge Orders     None        Note:  This document was prepared using Dragon voice recognition software and may include unintentional dictation errors.  Duffy Bruce, MD 10/03/22 [phone]

## 2022-10-02 NOTE — Progress Notes (Signed)
I,Sha'taria Tyson,acting as a Education administrator for [employer], PA-C.,have documented all relevant documentation on the behalf of Justin Speak, PA-C,as directed by  [employer], PA-C while in the presence of [employer], PA-C.   Established patient visit   Patient: Justin [name]   DOB: [birth year]   42 y.o. Male  MRN: [id number] Visit Date: 10/02/2022  Today's healthcare provider: [name], PA-C   CC: Leg spasms  Subjective    HPI HPI   Leg spasm, electricity/burning sensation, vomiting--level 10 for bout 1 month Last edited by Elta Guadeloupe, CMA on 10/02/2022  4:19 PM.      Pt states that he has been having leg spasms for about 1 mo. Per pt, yesterday he was diagnosed with UTI and abx was called in for him. Per chart review, pt dropped an urine sample on 09/24/22 and his Rx was sent to his pharmacy on 09/29/22.  Pt reports having recurrent UTI with leg spasms. Pt is paraplegic and the only symptoms indicative for UTI are usually leg spasms. Pt has a video of his leg spasms.  Pt requested a referral to pain clinic as his current pain lever is not controlled by his current medication regimen.   Medications: Outpatient Medications Prior to Visit  Medication Sig   albuterol (VENTOLIN HFA) 108 (90 Base) MCG/ACT inhaler Inhale 2 puffs into the lungs every 6 (six) hours as needed for wheezing or shortness of breath.   alprazolam (XANAX) 2 MG tablet TAKE ONE TABLET EVERY SIX HOURS IF NEEDED   amphetamine-dextroamphetamine (ADDERALL) 15 MG tablet Take 1 tablet by mouth 2 (two) times daily.   azelastine (OPTIVAR) 0.05 % ophthalmic solution Place 1 drop into both eyes 2 (two) times daily.   baclofen (LIORESAL) 20 MG tablet TAKE ONE TABLET FOUR TIMES DAILY   cefdinir (OMNICEF) 300 MG capsule Take 2 capsules (600 mg total) by mouth daily for 10 days.   clotrimazole (LOTRIMIN) 1 % cream Apply 1 application topically 2 (two) times daily.   clotrimazole-betamethasone (LOTRISONE)  cream Apply 1 application topically 2 (two) times daily.   diphenhydrAMINE (BENADRYL) 50 MG capsule Take 1 capsule 1 hour before procedure   docusate sodium (COLACE) 250 MG capsule Take 1 capsule (250 mg total) by mouth 4 (four) times daily as needed.   dronabinol (MARINOL) 10 MG capsule Take 1 capsule (10 mg total) by mouth every 6 (six) hours as needed.   fesoterodine (TOVIAZ) 8 MG TB24 tablet TAKE 1 TABLET BY MOUTH DAILY   flavoxATE (URISPAS) 100 MG tablet Take 1 tablet (100 mg total) by mouth 3 (three) times daily as needed for bladder spasms.   fluticasone (FLONASE) 50 MCG/ACT nasal spray Place 2 sprays into both nostrils daily.   gabapentin (NEURONTIN) 800 MG tablet Take 1 tablet (800 mg total) by mouth 4 (four) times daily.   ketoconazole (NIZORAL) 2 % cream Apply 1 Application topically 2 (two) times daily.   levothyroxine (SYNTHROID) 88 MCG tablet Take 1 tablet (88 mcg total) by mouth daily.   lidocaine (LMX) 4 % cream Apply 1 Application topically 3 (three) times daily.   linaclotide (LINZESS) 145 MCG CAPS capsule Take 1 capsule (145 mcg total) by mouth daily before breakfast.   methadone (DOLOPHINE) 10 MG tablet Take 2 tablets (20 mg total) by mouth every 6 (six) hours.   Mineral Oil OIL daily.   naproxen sodium (ANAPROX) 550 MG tablet TAKE 1 TABLET BY MOUTH 2 TIMES DAILY WITH A MEAL.  omeprazole (PRILOSEC) 20 MG capsule TAKE 1 CAPSULE BY MOUTH ONCE DAILY   oxybutynin (DITROPAN) 5 MG tablet Take 5 mg by mouth daily as needed.   oxyCODONE (ROXICODONE) 15 MG immediate release tablet TAKE 1 TABLET BY MOUTH EVERY 6 HOUR AS NEEDED FOR PAIN   phenazopyridine (PYRIDIUM) 200 MG tablet Take 1 tablet (200 mg total) by mouth 3 (three) times daily.   polyethylene glycol powder (QC NATURA-LAX) 17 GM/SCOOP powder MIX 1 CAPFUL IN 8 0Z OF WATER OR JUICE AND DRINK ONCE A DAY   promethazine (PHENERGAN) 25 MG suppository PLACE 1 SUPPOSITORY RECTALLY EVERY 6 HOURS AS NEEDED FOR NAUSEA OR VOMITING    promethazine (PHENERGAN) 25 MG tablet TAKE 1 TABLET BY MOUTH EVERY 4 HOURS AS NEEDED FOR NAUSEA AND VOMITING   sildenafil (REVATIO) 20 MG tablet Take 3 to 5 tablets two hours before intercouse on an empty stomach.  Do not take with nitrates.   silver sulfADIAZINE (SSD) 1 % cream APPLY TO AFFECTED AREAS TOPICALLY TWICE DAILY   tiZANidine (ZANAFLEX) 4 MG tablet TAKE 2 TABLETS BY MOUTH 4 TIMES DAILY   No facility-administered medications prior to visit.    Review of Systems  All other systems reviewed and are negative. Except see hpi     Objective    BP (!) 160/90 (BP Location: Left Arm, Patient Position: Sitting, Cuff Size: Normal) Comment: manually  Pulse (!) 130   Temp 98.2 F (36.8 C)   SpO2 98%    Physical Exam Constitutional:      General: He is not in acute distress.    Appearance: Normal appearance. He is diaphoretic.  HENT:     Head: Normocephalic.     Nose: Nose normal.  Eyes:     Extraocular Movements: Extraocular movements intact.     Conjunctiva/sclera: Conjunctivae normal.     Pupils: Pupils are equal, round, and reactive to light.  Pulmonary:     Effort: Pulmonary effort is normal. No respiratory distress.  Musculoskeletal:     Cervical back: Normal range of motion.     Comments: Episodes of muscle spasms during the vitis  Neurological:     Mental Status: He is alert and oriented to person, place, and time. Mental status is at baseline.  Psychiatric:        Behavior: Behavior normal.        Thought Content: Thought content normal.        Judgment: Judgment normal.     No results found for any visits on 10/02/22.  Assessment & Plan     Muscle spasms of both lower extremities Spasticity Paraplegia (HCC) Chronic, unstable Continue his current pain regimen including pain medications, muscle relaxants Oxycodone 15 mg immediate release was reordered on 09/21/22. - Ambulatory referral to Pain Clinic, per pt request. Pt would like to be scheduled with Dr.  Albertina [facility] Due to spasticity to be associated with UTI, pt was advised to proceed with UTI treatment: Dr. Caryn Section prescribed pt a course of antibiotics/cefdinir on 09/29/22 Pt was seen Urology on 07/10/22 for recurrent UTI, and was treated with Macrobid. He was sent to [facility] for urodynamics as previously was recommended by Dr. Diamantina Providence. Recommended to continue his treatment.  Tachycardia HR was between 130-140 Could be due to spasms, UTIs, pain His BP increased to 160/90 during the visit. Pt was advised to proceed to ER for an evaluation/testing/labs/management  The patient was advised to call back or seek an in-person evaluation  if the symptoms worsen or if the condition fails to improve as anticipated.  I discussed the assessment and treatment plan with the patient. The patient was provided an opportunity to ask questions and all were answered. The patient agreed with the plan and demonstrated an understanding of the instructions.  The entirety of the information documented in the History of Present Illness, Review of Systems and Physical Exam were personally obtained by me. Portions of this information were initially documented by the CMA and reviewed by me for thoroughness and accuracy.  Debera Lat, [facility], MMS [facility] [phone] (phone) [phone] (fax)

## 2022-10-03 ENCOUNTER — Encounter: Payer: Self-pay | Admitting: Internal Medicine

## 2022-10-03 ENCOUNTER — Other Ambulatory Visit: Payer: Self-pay

## 2022-10-03 DIAGNOSIS — N390 Urinary tract infection, site not specified: Secondary | ICD-10-CM | POA: Diagnosis not present

## 2022-10-03 DIAGNOSIS — R000 Tachycardia, unspecified: Secondary | ICD-10-CM | POA: Diagnosis not present

## 2022-10-03 DIAGNOSIS — M62838 Other muscle spasm: Secondary | ICD-10-CM | POA: Diagnosis not present

## 2022-10-03 LAB — HIV ANTIBODY (ROUTINE TESTING W REFLEX): HIV Screen 4th Generation wRfx: NONREACTIVE

## 2022-10-03 MED ORDER — MINERAL OIL PO OIL
TOPICAL_OIL | Freq: Every day | ORAL | Status: DC | PRN
  Filled 2022-10-03 (×2): qty 473

## 2022-10-03 MED ORDER — HYDROMORPHONE HCL 1 MG/ML IJ SOLN
1.0000 mg | INTRAMUSCULAR | Status: DC | PRN
  Administered 2022-10-03 – 2022-10-04 (×5): 1 mg via INTRAVENOUS
  Filled 2022-10-03 (×6): qty 1

## 2022-10-03 MED ORDER — ALPRAZOLAM 0.5 MG PO TABS
2.0000 mg | ORAL_TABLET | Freq: Four times a day (QID) | ORAL | Status: DC | PRN
  Administered 2022-10-04 – 2022-10-05 (×3): 2 mg via ORAL
  Filled 2022-10-03 (×3): qty 4

## 2022-10-03 MED ORDER — FESOTERODINE FUMARATE ER 4 MG PO TB24
8.0000 mg | ORAL_TABLET | Freq: Every day | ORAL | Status: DC
  Administered 2022-10-04 – 2022-10-05 (×2): 8 mg via ORAL
  Filled 2022-10-03 (×2): qty 1
  Filled 2022-10-03 (×2): qty 2

## 2022-10-03 MED ORDER — OXYCODONE HCL 5 MG PO TABS
15.0000 mg | ORAL_TABLET | Freq: Four times a day (QID) | ORAL | Status: DC | PRN
  Administered 2022-10-04 – 2022-10-05 (×2): 15 mg via ORAL
  Filled 2022-10-03 (×3): qty 3

## 2022-10-03 MED ORDER — DOCUSATE SODIUM 100 MG PO CAPS
200.0000 mg | ORAL_CAPSULE | Freq: Four times a day (QID) | ORAL | Status: DC | PRN
  Administered 2022-10-03 – 2022-10-04 (×3): 200 mg via ORAL
  Filled 2022-10-03 (×3): qty 2

## 2022-10-03 MED ORDER — DIPHENHYDRAMINE HCL 50 MG/ML IJ SOLN
25.0000 mg | INTRAMUSCULAR | Status: DC | PRN
  Administered 2022-10-03: 25 mg via INTRAVENOUS
  Filled 2022-10-03: qty 1

## 2022-10-03 MED ORDER — POLYETHYLENE GLYCOL 3350 17 G PO PACK
17.0000 g | PACK | Freq: Every day | ORAL | Status: DC | PRN
  Administered 2022-10-04: 17 g via ORAL
  Filled 2022-10-03 (×2): qty 1

## 2022-10-03 MED ORDER — ENOXAPARIN SODIUM 40 MG/0.4ML IJ SOSY
40.0000 mg | PREFILLED_SYRINGE | INTRAMUSCULAR | Status: DC
  Administered 2022-10-03 – 2022-10-05 (×3): 40 mg via SUBCUTANEOUS
  Filled 2022-10-03 (×3): qty 0.4

## 2022-10-03 MED ORDER — MINERAL OIL PO OIL: TOPICAL_OIL | Freq: Every day | ORAL | Status: DC | PRN

## 2022-10-03 MED ORDER — PHENAZOPYRIDINE HCL 200 MG PO TABS
200.0000 mg | ORAL_TABLET | Freq: Three times a day (TID) | ORAL | Status: DC
  Administered 2022-10-03 – 2022-10-05 (×7): 200 mg via ORAL
  Filled 2022-10-03 (×8): qty 1

## 2022-10-03 MED ORDER — METAXALONE 800 MG PO TABS
400.0000 mg | ORAL_TABLET | Freq: Three times a day (TID) | ORAL | Status: DC
  Administered 2022-10-03 – 2022-10-05 (×6): 400 mg via ORAL
  Filled 2022-10-03 (×8): qty 0.5

## 2022-10-03 MED ORDER — METHADONE HCL 10 MG PO TABS
20.0000 mg | ORAL_TABLET | Freq: Four times a day (QID) | ORAL | Status: DC
  Administered 2022-10-03 – 2022-10-05 (×10): 20 mg via ORAL
  Filled 2022-10-03: qty 2
  Filled 2022-10-03 (×2): qty 4
  Filled 2022-10-03 (×2): qty 2
  Filled 2022-10-03: qty 4
  Filled 2022-10-03: qty 2
  Filled 2022-10-03: qty 4
  Filled 2022-10-03 (×2): qty 2
  Filled 2022-10-03: qty 4

## 2022-10-03 MED ORDER — GABAPENTIN 400 MG PO CAPS
400.0000 mg | ORAL_CAPSULE | Freq: Four times a day (QID) | ORAL | Status: DC
  Administered 2022-10-03 – 2022-10-04 (×5): 400 mg via ORAL
  Filled 2022-10-03 (×5): qty 1

## 2022-10-03 MED ORDER — ACETAMINOPHEN 325 MG PO TABS: 650.0000 mg | ORAL_TABLET | Freq: Four times a day (QID) | ORAL | Status: DC | PRN

## 2022-10-03 MED ORDER — PANTOPRAZOLE SODIUM 40 MG PO TBEC
40.0000 mg | DELAYED_RELEASE_TABLET | Freq: Every day | ORAL | Status: DC
  Administered 2022-10-03 – 2022-10-05 (×3): 40 mg via ORAL
  Filled 2022-10-03 (×3): qty 1

## 2022-10-03 MED ORDER — GABAPENTIN 400 MG PO CAPS
800.0000 mg | ORAL_CAPSULE | Freq: Four times a day (QID) | ORAL | Status: DC
  Administered 2022-10-03: 400 mg via ORAL
  Administered 2022-10-03: 800 mg via ORAL
  Filled 2022-10-03 (×4): qty 2

## 2022-10-03 MED ORDER — GABAPENTIN 400 MG PO CAPS
400.0000 mg | ORAL_CAPSULE | Freq: Four times a day (QID) | ORAL | Status: DC | PRN
  Administered 2022-10-04: 400 mg via ORAL
  Filled 2022-10-03: qty 1

## 2022-10-03 MED ORDER — SODIUM CHLORIDE 0.9 % IV SOLN
2.0000 g | INTRAVENOUS | Status: DC
  Administered 2022-10-03 – 2022-10-05 (×3): 2 g via INTRAVENOUS
  Filled 2022-10-03: qty 20
  Filled 2022-10-03: qty 2
  Filled 2022-10-03 (×2): qty 20

## 2022-10-03 MED ORDER — FLAVOXATE HCL 100 MG PO TABS: 100.0000 mg | ORAL_TABLET | Freq: Three times a day (TID) | ORAL | Status: DC | PRN

## 2022-10-03 MED ORDER — SODIUM CHLORIDE 0.9% FLUSH
3.0000 mL | Freq: Two times a day (BID) | INTRAVENOUS | Status: DC
  Administered 2022-10-03 – 2022-10-05 (×6): 3 mL via INTRAVENOUS

## 2022-10-03 MED ORDER — BACLOFEN 10 MG PO TABS
20.0000 mg | ORAL_TABLET | Freq: Four times a day (QID) | ORAL | Status: DC
  Administered 2022-10-03 – 2022-10-05 (×10): 20 mg via ORAL
  Filled 2022-10-03 (×10): qty 2

## 2022-10-03 MED ORDER — ACETAMINOPHEN 650 MG RE SUPP: 650.0000 mg | Freq: Four times a day (QID) | RECTAL | Status: DC | PRN

## 2022-10-03 MED ORDER — LINACLOTIDE 145 MCG PO CAPS
145.0000 ug | ORAL_CAPSULE | Freq: Every day | ORAL | Status: DC
  Administered 2022-10-03 – 2022-10-05 (×3): 145 ug via ORAL
  Filled 2022-10-03 (×3): qty 1

## 2022-10-03 MED ORDER — SODIUM CHLORIDE 0.9 % IV SOLN
12.5000 mg | Freq: Four times a day (QID) | INTRAVENOUS | Status: DC | PRN
  Filled 2022-10-03: qty 0.5

## 2022-10-03 MED ORDER — METAXALONE 800 MG PO TABS
400.0000 mg | ORAL_TABLET | Freq: Once | ORAL | Status: AC
  Administered 2022-10-03: 400 mg via ORAL
  Filled 2022-10-03: qty 0.5

## 2022-10-03 MED ORDER — KETOTIFEN FUMARATE 0.035 % OP SOLN
1.0000 [drp] | Freq: Two times a day (BID) | OPHTHALMIC | Status: DC
  Administered 2022-10-03 – 2022-10-05 (×5): 1 [drp] via OPHTHALMIC
  Filled 2022-10-03 (×2): qty 5

## 2022-10-03 MED ORDER — LEVOTHYROXINE SODIUM 88 MCG PO TABS
88.0000 ug | ORAL_TABLET | Freq: Every day | ORAL | Status: DC
  Administered 2022-10-03 – 2022-10-05 (×3): 88 ug via ORAL
  Filled 2022-10-03 (×4): qty 1

## 2022-10-03 MED ORDER — TIZANIDINE HCL 4 MG PO TABS
8.0000 mg | ORAL_TABLET | Freq: Four times a day (QID) | ORAL | Status: DC
  Administered 2022-10-03 – 2022-10-04 (×7): 8 mg via ORAL
  Filled 2022-10-03: qty 4
  Filled 2022-10-03 (×5): qty 2
  Filled 2022-10-03: qty 4
  Filled 2022-10-03: qty 2
  Filled 2022-10-03: qty 4

## 2022-10-03 NOTE — ED Notes (Signed)
Portable equipment called for mattress order

## 2022-10-03 NOTE — ED Notes (Signed)
Pharm states [facility] currently out-of-stock but will send dose to ER once med available.

## 2022-10-03 NOTE — ED Notes (Signed)
To bedside with pt's morning meds. Provider assessing pt. Will give meds once provider done assessing and talking with pt.

## 2022-10-03 NOTE — ED Notes (Signed)
Patient placed on air mattress bed

## 2022-10-03 NOTE — ED Notes (Signed)
Lab staff called this RN to inquire about urine culture order since they received a sample from previous RN. This RN stated that per chart it appears sample had been collected yesterday so if already run this RN is not currently aware of orders to have repeat completed. Lab staff states they will keep sample in case attending decides to reorder urine culture later.

## 2022-10-03 NOTE — ED Notes (Signed)
Pt given bed pads, large gloves and odor spray as requested.

## 2022-10-03 NOTE — ED Notes (Signed)
NT to bedside to assist pt as he used call bell.

## 2022-10-03 NOTE — ED Notes (Signed)
Remaining scheduled meds not noted currently in pt specific med bin, tube station, nurses station, pt's room or pyxis. Messaged pharm missing dose.

## 2022-10-03 NOTE — ED Notes (Signed)
Will give remaining scheduled morning meds once received from pharm. Checked pt specific med bin, pyxis and tube station twice.

## 2022-10-03 NOTE — Progress Notes (Addendum)
Progress Note    Justin [name]  AYT:[id number] DOB: [birth year]  DOA: 10/02/2022 PCP: Malva Limes, MD      Brief Narrative:    Medical records reviewed and are as summarized below:  Justin [name] is a 42 y.o. male with medical history significant for neurogenic bladder, frequent UTIs, paraplegia from an accident that occurred over 20 years ago, depression, anxiety, chronic pain syndrome, asthma, who presented to the hospital because of lower extremity muscle spasms and elevated heart rate.  Recent diagnosis of E. coli UTI on 09/26/2021.  He was prescribed Omnicef but he only started taking the Omnicef day prior to admission.      Assessment/Plan:   Principal Problem:   Sinus tachycardia Active Problems:   Muscle spasticity   Recurrent UTI   Bladder neurogenesis    Body mass index is 24.37 kg/m.   Recurrent UTI: Continue IV ceftriaxone for now.  Follow-up urine cultures.  Urine culture from 09/26/2022 showed E. coli.   Sinus tachycardia: Overall, heart rate is better   Paraplegia, muscle spasms: Improved.  Continue gabapentin, baclofen, tizanidine and metaxalone.  IV Dilaudid as needed for pain.   Neurogenic bladder: Patient has intermittent urethral catheterization.  He has his special device for urethral catheterization.    Diet Order             Diet regular Room service appropriate? Yes; Fluid consistency: Thin  Diet effective now                            Consultants: None  Procedures: None    Medications:    baclofen  20 mg Oral QID   enoxaparin (LOVENOX) injection  40 mg Subcutaneous Q24H   fesoterodine  8 mg Oral Daily   gabapentin  400 mg Oral QID   ketotifen  1 drop Both Eyes BID   levothyroxine  88 mcg Oral Q0600   linaclotide  145 mcg Oral QAC breakfast   metaxalone  400 mg Oral TID   methadone  20 mg Oral Q6H   pantoprazole  40 mg Oral Daily   phenazopyridine  200 mg Oral TID    sodium chloride flush  3 mL Intravenous Q12H   tiZANidine  8 mg Oral Q6H   Continuous Infusions:  cefTRIAXone (ROCEPHIN)  IV Stopped (10/03/22 0440)   promethazine (PHENERGAN) injection (IM or IVPB)       Anti-infectives (From admission, onward)    Start     Dose/Rate Route Frequency Ordered Stop   10/03/22 0400  cefTRIAXone (ROCEPHIN) 2 g in sodium chloride 0.9 % 100 mL IVPB        2 g 200 mL/hr over 30 Minutes Intravenous Every 24 hours 10/03/22 0015     10/02/22 2015  ceFEPIme (MAXIPIME) 2 g in sodium chloride 0.9 % 100 mL IVPB        2 g 200 mL/hr over 30 Minutes Intravenous  Once 10/02/22 2001 10/02/22 2150              Family Communication/Anticipated D/C date and plan/Code Status   DVT prophylaxis: enoxaparin (LOVENOX) injection 40 mg Start: 10/03/22 0800     Code Status: Full Code  Family Communication: None Disposition Plan: Plan to discharge home tomorrow   Status is: Observation The patient will require care spanning > 2 midnights and should be moved to inpatient because: IV antibiotics for UTI  Subjective:   Interval events noted.  Spasms are improving.  He feels better today.  He showed me videos of lower extremity spasms that he had prior to admission.  [place], RN, was at the bedside  Objective:    Vitals:   10/03/22 1130 10/03/22 1252 10/03/22 1253 10/03/22 1611  BP:  (!) 150/69  120/65  Pulse:  (!) 104 86 87  Resp: 13 15  16   Temp:  (!) 97.2 F (36.2 C)  98.1 F (36.7 C)  TempSrc:  Oral    SpO2:  97%  96%  Weight:      Height:       No data found.   Intake/Output Summary (Last 24 hours) at 10/03/2022 1701 Last data filed at 10/03/2022 1600 Gross per 24 hour  Intake 200 ml  Output --  Net 200 ml   Filed Weights   10/03/22 0200  Weight: 90.8 kg    Exam:  GEN: NAD SKIN: Warm and dry EYES: No pallor or icterus ENT: MMM CV: RRR PULM: CTA B ABD: soft, ND, NT, +BS CNS: AAO x 3, paraplegic EXT: No edema or  tenderness        Data Reviewed:   I have personally reviewed following labs and imaging studies:  Labs: Labs show the following:   Basic Metabolic Panel: Recent Labs  Lab 10/02/22 1829 10/02/22 2020  NA 139  --   K 3.8  --   CL 107  --   CO2 21*  --   GLUCOSE 107*  --   BUN 22*  --   CREATININE 0.83  --   CALCIUM 9.2  --   MG  --  1.8   GFR Estimated Creatinine Clearance: 143.8 mL/min (by C-G formula based on SCr of 0.83 mg/dL). Liver Function Tests: Recent Labs  Lab 10/02/22 1829  AST 27  ALT 23  ALKPHOS 63  BILITOT 0.8  PROT 7.2  ALBUMIN 4.1   No results for input(s): "LIPASE", "AMYLASE" in the last 168 hours. No results for input(s): "AMMONIA" in the last 168 hours. Coagulation profile No results for input(s): "INR", "PROTIME" in the last 168 hours.  CBC: Recent Labs  Lab 10/02/22 1829  WBC 6.8  NEUTROABS 5.2  HGB 14.2  HCT 43.4  MCV 84.4  PLT 182   Cardiac Enzymes: Recent Labs  Lab 10/02/22 2020  CKTOTAL 198   BNP (last 3 results) No results for input(s): "PROBNP" in the last 8760 hours. CBG: No results for input(s): "GLUCAP" in the last 168 hours. D-Dimer: No results for input(s): "DDIMER" in the last 72 hours. Hgb A1c: No results for input(s): "HGBA1C" in the last 72 hours. Lipid Profile: No results for input(s): "CHOL", "HDL", "LDLCALC", "TRIG", "CHOLHDL", "LDLDIRECT" in the last 72 hours. Thyroid function studies: No results for input(s): "TSH", "T4TOTAL", "T3FREE", "THYROIDAB" in the last 72 hours.  Invalid input(s): "FREET3" Anemia work up: No results for input(s): "VITAMINB12", "FOLATE", "FERRITIN", "TIBC", "IRON", "RETICCTPCT" in the last 72 hours. Sepsis Labs: Recent Labs  Lab 10/02/22 1829 10/02/22 2020  PROCALCITON  --  <0.10  WBC 6.8  --     Microbiology Recent Results (from the past 240 hour(s))  Urine Culture     Status: Abnormal   Collection Time: 09/26/22  4:57 PM   Specimen: Urine   Urine  Result  Value Ref Range Status   Urine Culture, Routine Final report (A)  Final   Organism ID, Bacteria Escherichia coli (A)  Final  Comment: Cefazolin <=4 ug/mL Cefazolin with an MIC <=16 predicts susceptibility to the oral agents cefaclor, cefdinir, cefpodoxime, cefprozil, cefuroxime, cephalexin, and loracarbef when used for therapy of uncomplicated urinary tract infections due to E. coli, Klebsiella pneumoniae, and Proteus mirabilis. 50,000-100,000 colony forming units per mL    Antimicrobial Susceptibility Comment  Final    Comment:       ** S = Susceptible; I = Intermediate; R = Resistant **                    P = Positive; N = Negative             MICS are expressed in micrograms per mL    Antibiotic                 RSLT#1    RSLT#2    RSLT#3    RSLT#4 Amoxicillin/Clavulanic Acid    I Ampicillin                     R Cefepime                       S Ceftriaxone                    S Cefuroxime                     I Ciprofloxacin                  R Ertapenem                      S Gentamicin                     S Imipenem                       S Levofloxacin                   R Meropenem                      S Nitrofurantoin                 S Piperacillin/Tazobactam        S Tetracycline                   R Tobramycin                     I Trimethoprim/Sulfa             R     Procedures and diagnostic studies:  No results found.             LOS: 0 days   Rueben Kassim  [facility]   Pager on [email or web address]. If 7PM-7AM, please contact night-coverage at [email or web address]     10/03/2022, 5:01 PM

## 2022-10-04 DIAGNOSIS — S24104S Unspecified injury at T11-T12 level of thoracic spinal cord, sequela: Secondary | ICD-10-CM | POA: Diagnosis not present

## 2022-10-04 DIAGNOSIS — G8220 Paraplegia, unspecified: Secondary | ICD-10-CM | POA: Diagnosis not present

## 2022-10-04 DIAGNOSIS — Z1152 Encounter for screening for COVID-19: Secondary | ICD-10-CM | POA: Diagnosis not present

## 2022-10-04 DIAGNOSIS — N319 Neuromuscular dysfunction of bladder, unspecified: Secondary | ICD-10-CM | POA: Diagnosis not present

## 2022-10-04 DIAGNOSIS — T40605A Adverse effect of unspecified narcotics, initial encounter: Secondary | ICD-10-CM | POA: Diagnosis not present

## 2022-10-04 DIAGNOSIS — G904 Autonomic dysreflexia: Secondary | ICD-10-CM

## 2022-10-04 DIAGNOSIS — Z888 Allergy status to other drugs, medicaments and biological substances status: Secondary | ICD-10-CM | POA: Diagnosis not present

## 2022-10-04 DIAGNOSIS — N390 Urinary tract infection, site not specified: Secondary | ICD-10-CM | POA: Diagnosis not present

## 2022-10-04 DIAGNOSIS — Z8249 Family history of ischemic heart disease and other diseases of the circulatory system: Secondary | ICD-10-CM | POA: Diagnosis not present

## 2022-10-04 DIAGNOSIS — B9620 Unspecified Escherichia coli [E. coli] as the cause of diseases classified elsewhere: Secondary | ICD-10-CM | POA: Diagnosis not present

## 2022-10-04 DIAGNOSIS — Z885 Allergy status to narcotic agent status: Secondary | ICD-10-CM | POA: Diagnosis not present

## 2022-10-04 DIAGNOSIS — J45909 Unspecified asthma, uncomplicated: Secondary | ICD-10-CM | POA: Diagnosis not present

## 2022-10-04 DIAGNOSIS — Z79899 Other long term (current) drug therapy: Secondary | ICD-10-CM | POA: Diagnosis not present

## 2022-10-04 DIAGNOSIS — F419 Anxiety disorder, unspecified: Secondary | ICD-10-CM | POA: Diagnosis not present

## 2022-10-04 DIAGNOSIS — G894 Chronic pain syndrome: Secondary | ICD-10-CM | POA: Diagnosis not present

## 2022-10-04 DIAGNOSIS — Z9109 Other allergy status, other than to drugs and biological substances: Secondary | ICD-10-CM | POA: Diagnosis not present

## 2022-10-04 DIAGNOSIS — Z87891 Personal history of nicotine dependence: Secondary | ICD-10-CM | POA: Diagnosis not present

## 2022-10-04 DIAGNOSIS — K5903 Drug induced constipation: Secondary | ICD-10-CM | POA: Diagnosis not present

## 2022-10-04 DIAGNOSIS — Z881 Allergy status to other antibiotic agents status: Secondary | ICD-10-CM | POA: Diagnosis not present

## 2022-10-04 DIAGNOSIS — F32A Depression, unspecified: Secondary | ICD-10-CM | POA: Diagnosis not present

## 2022-10-04 DIAGNOSIS — Z960 Presence of urogenital implants: Secondary | ICD-10-CM | POA: Diagnosis not present

## 2022-10-04 DIAGNOSIS — R000 Tachycardia, unspecified: Secondary | ICD-10-CM | POA: Diagnosis not present

## 2022-10-04 DIAGNOSIS — M62838 Other muscle spasm: Secondary | ICD-10-CM | POA: Diagnosis not present

## 2022-10-04 DIAGNOSIS — R69 Illness, unspecified: Secondary | ICD-10-CM | POA: Diagnosis not present

## 2022-10-04 DIAGNOSIS — Z79890 Hormone replacement therapy (postmenopausal): Secondary | ICD-10-CM | POA: Diagnosis not present

## 2022-10-04 MED ORDER — GABAPENTIN 100 MG PO CAPS: 200.0000 mg | ORAL_CAPSULE | Freq: Four times a day (QID) | ORAL | Status: DC | PRN

## 2022-10-04 MED ORDER — HYDROMORPHONE HCL 1 MG/ML IJ SOLN
0.5000 mg | INTRAMUSCULAR | Status: AC | PRN
  Administered 2022-10-04 – 2022-10-05 (×2): 0.5 mg via INTRAVENOUS
  Filled 2022-10-04 (×3): qty 0.5

## 2022-10-04 MED ORDER — POLYETHYLENE GLYCOL 3350 17 G PO PACK
17.0000 g | PACK | Freq: Every day | ORAL | Status: DC
  Administered 2022-10-05: 17 g via ORAL
  Filled 2022-10-04: qty 1

## 2022-10-04 MED ORDER — TIZANIDINE HCL 4 MG PO TABS
8.0000 mg | ORAL_TABLET | Freq: Four times a day (QID) | ORAL | Status: DC
  Administered 2022-10-04 – 2022-10-05 (×3): 8 mg via ORAL
  Filled 2022-10-04 (×5): qty 2

## 2022-10-04 MED ORDER — GABAPENTIN 300 MG PO CAPS
600.0000 mg | ORAL_CAPSULE | Freq: Four times a day (QID) | ORAL | Status: DC
  Administered 2022-10-04 – 2022-10-05 (×3): 600 mg via ORAL
  Filled 2022-10-04 (×3): qty 2

## 2022-10-04 NOTE — Care Management Obs Status (Signed)
[place] NOTIFICATION   Patient Details  Name: Rudi Bunyard MRN: [id number] Date of Birth: 1981-01-26   Medicare Observation Status Notification Given:  Yes    Candie Chroman, LCSW 10/04/2022, 11:02 AM
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
## 2022-10-04 NOTE — Plan of Care (Signed)
Patient AOX4, VSS throughout shift.  All meds given on time as ordered.  Pt c/o pain relieved by PRN dilaudid.  Pt I&O cath self.  All meds given on time as ordered.  Pt took bed bath last night.  Diminished lungs, IS encouraged.  POC maintained, will continue to monitor.  Problem: Education: Goal: Knowledge of General Education information will improve Description: Including pain rating scale, medication(s)/side effects and non-pharmacologic comfort measures Outcome: Progressing   Problem: Health Behavior/Discharge Planning: Goal: Ability to manage health-related needs will improve Outcome: Progressing   Problem: Clinical Measurements: Goal: Ability to maintain clinical measurements within normal limits will improve Outcome: Progressing Goal: Will remain free from infection Outcome: Progressing Goal: Diagnostic test results will improve Outcome: Progressing Goal: Respiratory complications will improve Outcome: Progressing Goal: Cardiovascular complication will be avoided Outcome: Progressing   Problem: Activity: Goal: Risk for activity intolerance will decrease Outcome: Progressing   Problem: Nutrition: Goal: Adequate nutrition will be maintained Outcome: Progressing   Problem: Coping: Goal: Level of anxiety will decrease Outcome: Progressing   Problem: Elimination: Goal: Will not experience complications related to bowel motility Outcome: Progressing Goal: Will not experience complications related to urinary retention Outcome: Progressing   Problem: Pain Managment: Goal: General experience of comfort will improve Outcome: Progressing   Problem: Safety: Goal: Ability to remain free from injury will improve Outcome: Progressing   Problem: Skin Integrity: Goal: Risk for impaired skin integrity will decrease Outcome: Progressing
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
## 2022-10-05 ENCOUNTER — Telehealth: Payer: Self-pay | Admitting: Family Medicine

## 2022-10-05 ENCOUNTER — Other Ambulatory Visit: Payer: Self-pay | Admitting: Family Medicine

## 2022-10-05 DIAGNOSIS — M62838 Other muscle spasm: Secondary | ICD-10-CM | POA: Diagnosis not present

## 2022-10-05 DIAGNOSIS — R000 Tachycardia, unspecified: Secondary | ICD-10-CM | POA: Diagnosis not present

## 2022-10-05 DIAGNOSIS — G894 Chronic pain syndrome: Secondary | ICD-10-CM

## 2022-10-05 DIAGNOSIS — N390 Urinary tract infection, site not specified: Secondary | ICD-10-CM | POA: Diagnosis not present

## 2022-10-05 DIAGNOSIS — F988 Other specified behavioral and emotional disorders with onset usually occurring in childhood and adolescence: Secondary | ICD-10-CM

## 2022-10-05 LAB — URINE CULTURE: Culture: 2000 — AB

## 2022-10-05 NOTE — Telephone Encounter (Signed)
Medication Refill - Medication: dextroamphetamine (ADDERALL) 15 MG tablet methadone (DOLOPHINE) 20 MG tablet   Has the patient contacted their pharmacy? yes (Agent: If no, request that the patient contact the pharmacy for the refill. If patient does not wish to contact the pharmacy document the reason why and proceed with request.) (Agent: If yes, when and what did the pharmacy advise?)  Preferred Pharmacy (with phone number or street name): [facility], [place] - [place]  Phone: [phone] Fax: [phone] Has the patient been seen for an appointment in the last year OR does the patient have an upcoming appointment? yes  Agent: Please be advised that RX refills may take up to 3 business days. We ask that you follow-up with your pharmacy.

## 2022-10-05 NOTE — TOC CM/SW Note (Signed)
Patient has orders to discharge home today. Chart reviewed. No TOC needs. CSW signing off.  Dalphine Cowie, CSW [phone]  

## 2022-10-05 NOTE — Discharge Summary (Signed)
Physician Discharge Summary   Patient: Justin [name] MRN: [id number] DOB: November 19, 1980  Admit date:     10/02/2022  Discharge date: 10/05/22  Discharge Physician: Lurene Shadow   PCP: Malva Limes, MD   Recommendations at discharge:    Follow up with PCP in 1 week Follow-up with neurologist as an outpatient as previously recommended by your PCP. Follow-up at a pain clinic of your choice  Discharge Diagnoses: Principal Problem:   Sinus tachycardia Active Problems:   Muscle spasticity   Recurrent UTI   Bladder neurogenesis  Resolved Problems:   * No resolved hospital problems. [facility] Course:  [name] is a 42 y.o. male with medical history significant for neurogenic bladder, frequent UTIs, paraplegia from an accident that occurred over 20 years ago, depression, anxiety, chronic pain syndrome, asthma, who presented to the hospital because of lower extremity painful muscle spasms and elevated heart rate.   He was recently diagnosed with E. coli UTI on 09/26/2021.  He was prescribed Omnicef but he only started taking the [facility] a day prior to admission.  He was admitted to the hospital for recurrent UTI.  He was treated with IV fluids and IV ceftriaxone.  He was also treated with analgesics and muscle relaxants for muscle spasms.  He had sinus tachycardia which was attributed to autonomic dysreflexia.  His condition has improved and he is deemed stable for discharge to home.  Assessment and Plan:   Recurrent UTI: Urine culture from 10/02/2022 showed E. coli.  Continue Omnicef at discharge. Urine culture from 09/26/2022 showed E. coli.     Sinus tachycardia: Resolved     Paraplegia, painful muscle spasms, autonomic dysreflexia: Outpatient follow-up with neurologist recommended.  Follow-up with the pain clinic for chronic pain management.  He said he has already called to schedule an appointment.  Continue analgesics and muscle relaxants.      Neurogenic bladder: Patient does intermittent urethral catheterization.  He has his special device for urethral catheterization.  Outpatient follow-up with urologist recommended.      Consultants: Neurologist Procedures performed: None Disposition: Home Diet recommendation:  Discharge Diet Orders (From admission, onward)     Start     Ordered   10/05/22 0000  Diet - low sodium heart healthy        10/05/22 1117           Cardiac diet DISCHARGE MEDICATION: Allergies as of 10/05/2022       Reactions   Morphine    psychosis   Azithromycin    throat closes   Iodine    Lorazepam    extremely hostile   Meperidine    Morphine Sulfate    Psychotic symptoms        Medication List     STOP taking these medications    clotrimazole 1 % cream Commonly known as: LOTRIMIN   clotrimazole-betamethasone cream Commonly known as: LOTRISONE       TAKE these medications    albuterol 108 (90 Base) MCG/ACT inhaler Commonly known as: VENTOLIN HFA Inhale 2 puffs into the lungs every 6 (six) hours as needed for wheezing or shortness of breath.   alprazolam 2 MG tablet Commonly known as: XANAX TAKE ONE TABLET EVERY SIX HOURS IF NEEDED   amphetamine-dextroamphetamine 15 MG tablet Commonly known as: ADDERALL Take 1 tablet by mouth 2 (two) times daily.   azelastine 0.05 % ophthalmic solution Commonly known as: OPTIVAR Place 1 drop into both eyes 2 (two) times  daily.   baclofen 20 MG tablet Commonly known as: LIORESAL TAKE ONE TABLET FOUR TIMES DAILY   cefdinir 300 MG capsule Commonly known as: OMNICEF Take 2 capsules (600 mg total) by mouth daily for 10 days.   diphenhydrAMINE 50 MG capsule Commonly known as: BENADRYL Take 1 capsule 1 hour before procedure   docusate sodium 250 MG capsule Commonly known as: COLACE Take 1 capsule (250 mg total) by mouth 4 (four) times daily as needed.   dronabinol 10 MG capsule Commonly known as: MARINOL Take 1 capsule (10 mg  total) by mouth every 6 (six) hours as needed.   fesoterodine 8 MG Tb24 tablet Commonly known as: TOVIAZ TAKE 1 TABLET BY MOUTH DAILY   flavoxATE 100 MG tablet Commonly known as: URISPAS Take 1 tablet (100 mg total) by mouth 3 (three) times daily as needed for bladder spasms.   fluticasone 50 MCG/ACT nasal spray Commonly known as: FLONASE Place 2 sprays into both nostrils daily.   gabapentin 800 MG tablet Commonly known as: NEURONTIN Take 1 tablet (800 mg total) by mouth 4 (four) times daily.   ketoconazole 2 % cream Commonly known as: NIZORAL Apply 1 Application topically 2 (two) times daily.   levothyroxine 88 MCG tablet Commonly known as: SYNTHROID Take 1 tablet (88 mcg total) by mouth daily.   lidocaine 4 % cream Commonly known as: LMX Apply 1 Application topically 3 (three) times daily.   linaclotide 145 MCG Caps capsule Commonly known as: Linzess Take 1 capsule (145 mcg total) by mouth daily before breakfast.   methadone 10 MG tablet Commonly known as: DOLOPHINE Take 2 tablets (20 mg total) by mouth every 6 (six) hours.   Mineral Oil Oil daily.   naproxen sodium 550 MG tablet Commonly known as: ANAPROX TAKE 1 TABLET BY MOUTH 2 TIMES DAILY WITH A MEAL.   omeprazole 20 MG capsule Commonly known as: PRILOSEC TAKE 1 CAPSULE BY MOUTH ONCE DAILY   oxybutynin 5 MG tablet Commonly known as: DITROPAN Take 5 mg by mouth daily as needed.   oxyCODONE 15 MG immediate release tablet Commonly known as: ROXICODONE TAKE 1 TABLET BY MOUTH EVERY 6 HOUR AS NEEDED FOR PAIN   phenazopyridine 200 MG tablet Commonly known as: PYRIDIUM Take 1 tablet (200 mg total) by mouth 3 (three) times daily.   polyethylene glycol powder 17 GM/SCOOP powder Commonly known as: QC Natura-LAX MIX 1 CAPFUL IN 8 0Z OF WATER OR JUICE AND DRINK ONCE A DAY   promethazine 25 MG suppository Commonly known as: PHENERGAN PLACE 1 SUPPOSITORY RECTALLY EVERY 6 HOURS AS NEEDED FOR NAUSEA OR  VOMITING   promethazine 25 MG tablet Commonly known as: PHENERGAN TAKE 1 TABLET BY MOUTH EVERY 4 HOURS AS NEEDED FOR NAUSEA AND VOMITING   sildenafil 20 MG tablet Commonly known as: REVATIO Take 3 to 5 tablets two hours before intercouse on an empty stomach.  Do not take with nitrates.   silver sulfADIAZINE 1 % cream Commonly known as: SSD APPLY TO AFFECTED AREAS TOPICALLY TWICE DAILY   tiZANidine 4 MG tablet Commonly known as: ZANAFLEX TAKE 2 TABLETS BY MOUTH 4 TIMES DAILY        Follow-up Information     [facility] Schedule an appointment as soon as possible for a visit in 1 week(s).   Specialty: Pain Medicine Contact information: [place] [id number] [address] [place] [phone]  Discharge Exam: Filed Weights   10/03/22 0200  Weight: 90.8 kg   GEN: NAD SKIN: Warm and dry EYES: No pallor or icterus ENT: MMM CV: RRR PULM: CTA B ABD: soft, ND, NT, +BS CNS: AAO x 3, paraplegic EXT: No edema or tenderness   Condition at discharge: good  The results of significant diagnostics from this hospitalization (including imaging, microbiology, ancillary and laboratory) are listed below for reference.   Imaging Studies: No results found.  Microbiology: Results for orders placed or performed during the hospital encounter of 10/02/22  Urine Culture     Status: Abnormal   Collection Time: 10/02/22  7:04 PM   Specimen: Urine, Catheterized  Result Value Ref Range Status   Specimen Description   Final    URINE, CATHETERIZED Performed at [facility], [address] [place], [place] [address]    Special Requests   Final    NONE Performed at [facility], [address] [place], [place] [address]    Culture 2,000 COLONIES/mL ESCHERICHIA COLI (A)  Final   Report Status 10/05/2022 FINAL  Final   Organism ID, Bacteria ESCHERICHIA  COLI (A)  Final      Susceptibility   Escherichia coli - MIC*    AMPICILLIN >=32 RESISTANT Resistant     CEFAZOLIN <=4 SENSITIVE Sensitive     CEFEPIME <=0.12 SENSITIVE Sensitive     CEFTRIAXONE <=0.25 SENSITIVE Sensitive     CIPROFLOXACIN >=4 RESISTANT Resistant     GENTAMICIN 4 SENSITIVE Sensitive     IMIPENEM <=0.25 SENSITIVE Sensitive     NITROFURANTOIN 64 INTERMEDIATE Intermediate     TRIMETH/SULFA >=320 RESISTANT Resistant     AMPICILLIN/SULBACTAM >=32 RESISTANT Resistant     PIP/TAZO <=4 SENSITIVE Sensitive     * 2,000 COLONIES/mL ESCHERICHIA COLI    Labs: CBC: Recent Labs  Lab 10/02/22 1829  WBC 6.8  NEUTROABS 5.2  HGB 14.2  HCT 43.4  MCV 84.4  PLT 509   Basic Metabolic Panel: Recent Labs  Lab 10/02/22 1829 10/02/22 2020  NA 139  --   K 3.8  --   CL 107  --   CO2 21*  --   GLUCOSE 107*  --   BUN 22*  --   CREATININE 0.83  --   CALCIUM 9.2  --   MG  --  1.8   Liver Function Tests: Recent Labs  Lab 10/02/22 1829  AST 27  ALT 23  ALKPHOS 63  BILITOT 0.8  PROT 7.2  ALBUMIN 4.1   CBG: No results for input(s): "GLUCAP" in the last 168 hours.  Discharge time spent: greater than 30 minutes.  Signed: Jennye Boroughs, MD [facility] 10/05/2022

## 2022-10-05 NOTE — Telephone Encounter (Signed)
Already requested in a separate encounter today and routed to the provider for approval.

## 2022-10-06 ENCOUNTER — Encounter: Payer: Self-pay | Admitting: Family Medicine

## 2022-10-08 ENCOUNTER — Telehealth: Payer: Self-pay | Admitting: *Deleted

## 2022-10-08 ENCOUNTER — Other Ambulatory Visit: Payer: Self-pay | Admitting: Family Medicine

## 2022-10-08 DIAGNOSIS — F988 Other specified behavioral and emotional disorders with onset usually occurring in childhood and adolescence: Secondary | ICD-10-CM

## 2022-10-08 DIAGNOSIS — G894 Chronic pain syndrome: Secondary | ICD-10-CM

## 2022-10-08 NOTE — Patient Outreach (Signed)
  Care Coordination [facility] Note Transition Care Management Unsuccessful Follow-up Telephone Call  Date of discharge and from where:  [phone] [facility] UTI  Attempts:  1st Attempt  Reason for unsuccessful TCM follow-up call:  Left voice message   [place] [facility] [phone]

## 2022-10-08 NOTE — Patient Outreach (Signed)
  Care Coordination [facility] Note Transition Care Management Follow-up Telephone Call Date of discharge and from where: [phone] [facility] UTI How have you been since you were released from the hospital? My spasms have returned Any questions or concerns? Yes I was told to follow up with a pain clinic and I wanted one in [place]. RN gave the patient the information of the pain clinic on his discharge summary that is in [facility] [place] Items Reviewed: Did the pt receive and understand the discharge instructions provided? Yes  Medications obtained and verified? Yes  Other? No  Any new allergies since your discharge? No  Dietary orders reviewed? No Do you have support at home? Yes   Home Care and Equipment/Supplies: Were home health services ordered? no If so, what is the name of the agency? N/a  Has the agency set up a time to come to the patient's home? no Were any new equipment or medical supplies ordered?  No What is the name of the medical supply agency? N/a  Were you able to get the supplies/equipment? not applicable Do you have any questions related to the use of the equipment or supplies? No  Functional Questionnaire: (I = Independent and D = Dependent) ADLs: D   Bathing/Dressing- D  Meal Prep- D  Eating- I  Maintaining continence- D  Transferring/Ambulation- D  Managing Meds- D  Follow up appointments reviewed:  PCP Hospital f/u appt confirmed? No  Patient stated he will schedule himself Specialist Hospital f/u appt confirmed? No  . Are transportation arrangements needed? No  If their condition worsens, is the pt aware to call PCP or go to the Emergency Dept.? Yes Was the patient provided with contact information for the PCP's office or ED? Yes Was to pt encouraged to call back with questions or concerns? Yes  SDOH assessments and interventions completed:   Yes SDOH Interventions Today     Flowsheet Row Most Recent Value  SDOH Interventions   Food Insecurity Interventions Intervention Not Indicated  Housing Interventions Intervention Not Indicated  Transportation Interventions Intervention Not Indicated       Care Coordination Interventions:  RN gave the patient the name and number of the pain clinic to schedule his appointment. Patient refused help with scheduling PCP appointment.     Encounter Outcome:  Pt. Visit Completed    [place] [facility] [phone]

## 2022-10-09 ENCOUNTER — Other Ambulatory Visit: Payer: Self-pay | Admitting: Family Medicine

## 2022-10-09 DIAGNOSIS — F988 Other specified behavioral and emotional disorders with onset usually occurring in childhood and adolescence: Secondary | ICD-10-CM

## 2022-10-09 DIAGNOSIS — G894 Chronic pain syndrome: Secondary | ICD-10-CM

## 2022-10-10 ENCOUNTER — Encounter: Payer: Self-pay | Admitting: Physician Assistant

## 2022-10-10 ENCOUNTER — Other Ambulatory Visit: Payer: Self-pay | Admitting: Physician Assistant

## 2022-10-10 DIAGNOSIS — S24109S Unspecified injury at unspecified level of thoracic spinal cord, sequela: Secondary | ICD-10-CM

## 2022-10-10 DIAGNOSIS — M62838 Other muscle spasm: Secondary | ICD-10-CM

## 2022-10-10 DIAGNOSIS — G8220 Paraplegia, unspecified: Secondary | ICD-10-CM

## 2022-10-10 NOTE — Telephone Encounter (Signed)
Please review.  KP

## 2022-10-10 NOTE — Telephone Encounter (Signed)
Please review. Pt needs Methadone and the Adderall refilled.  KP

## 2022-10-11 NOTE — Telephone Encounter (Signed)
Patient called to get an update on medication refill request. Patient saw his mychart request was declined. Advised patient the pharmacy had also sent the request.

## 2022-10-12 ENCOUNTER — Telehealth: Payer: Self-pay | Admitting: Family Medicine

## 2022-10-12 NOTE — Telephone Encounter (Signed)
PA started

## 2022-10-12 NOTE — Telephone Encounter (Signed)
Copied from [place] [id number]. Topic: General - Other >> Oct 12, 2022  8:59 AM Everette C wrote: Reason for CRM: The patient has called for an update on their previously requested refills of amphetamine-dextroamphetamine (ADDERALL) 15 MG tablet [[id number]] and methadone (DOLOPHINE) 10 MG tablet [[id number]]   The patient would like to know when they can expect the medication to be ready at their pharmacy   Please contact the patient further when possible

## 2022-10-12 NOTE — Telephone Encounter (Signed)
Patient called stating the Methadone has to be prior authorized and he is almost out of it.   He wants [place] to get this done today.

## 2022-10-12 NOTE — Telephone Encounter (Signed)
We received a fax from CoverMyMeds for methadone HCI 10mg   Key: [id number]

## 2022-10-12 NOTE — Telephone Encounter (Signed)
PA approved Pharmacy advised

## 2022-10-14 NOTE — Progress Notes (Signed)
Established patient visit   Patient: Justin [name]   DOB: October 04, 1980   42 y.o. Male  MRN: [id number] Visit Date: 10/17/2022  Today's healthcare provider: Debera Lat, PA-C   ZD:GLOV in both arms and facial swelling  Subjective     HPI   Pt stated--doing much better. Pt face is swollen before getting the fluid. Rash both arm Last edited by Shelly Bombard, CMA on 10/17/2022  4:25 PM.        Medications: Outpatient Medications Prior to Visit  Medication Sig   albuterol (VENTOLIN HFA) 108 (90 Base) MCG/ACT inhaler Inhale 2 puffs into the lungs every 6 (six) hours as needed for wheezing or shortness of breath.   alprazolam (XANAX) 2 MG tablet TAKE ONE TABLET EVERY SIX HOURS IF NEEDED   amphetamine-dextroamphetamine (ADDERALL) 15 MG tablet TAKE ONE (1) TABLET BY MOUTH TWO TIMES PER DAY   azelastine (OPTIVAR) 0.05 % ophthalmic solution Place 1 drop into both eyes 2 (two) times daily.   baclofen (LIORESAL) 20 MG tablet TAKE ONE TABLET FOUR TIMES DAILY   diphenhydrAMINE (BENADRYL) 50 MG capsule Take 1 capsule 1 hour before procedure   docusate sodium (COLACE) 250 MG capsule Take 1 capsule (250 mg total) by mouth 4 (four) times daily as needed.   dronabinol (MARINOL) 10 MG capsule Take 1 capsule (10 mg total) by mouth every 6 (six) hours as needed.   fesoterodine (TOVIAZ) 8 MG TB24 tablet TAKE 1 TABLET BY MOUTH DAILY   flavoxATE (URISPAS) 100 MG tablet Take 1 tablet (100 mg total) by mouth 3 (three) times daily as needed for bladder spasms.   fluticasone (FLONASE) 50 MCG/ACT nasal spray Place 2 sprays into both nostrils daily.   gabapentin (NEURONTIN) 800 MG tablet Take 1 tablet (800 mg total) by mouth 4 (four) times daily.   ketoconazole (NIZORAL) 2 % cream Apply 1 Application topically 2 (two) times daily.   levothyroxine (SYNTHROID) 88 MCG tablet Take 1 tablet (88 mcg total) by mouth daily.   lidocaine (LMX) 4 % cream Apply 1 Application topically 3 (three) times  daily.   linaclotide (LINZESS) 145 MCG CAPS capsule Take 1 capsule (145 mcg total) by mouth daily before breakfast.   methadone (DOLOPHINE) 10 MG tablet TAKE 2 TABLETS BY MOUTH EVERY 6 HOURS   Mineral Oil OIL daily.   naproxen sodium (ANAPROX) 550 MG tablet TAKE 1 TABLET BY MOUTH 2 TIMES DAILY WITH A MEAL.   omeprazole (PRILOSEC) 20 MG capsule TAKE 1 CAPSULE BY MOUTH ONCE DAILY   oxybutynin (DITROPAN) 5 MG tablet Take 5 mg by mouth daily as needed.   oxyCODONE (ROXICODONE) 15 MG immediate release tablet TAKE 1 TABLET BY MOUTH EVERY 6 HOUR AS NEEDED FOR PAIN   phenazopyridine (PYRIDIUM) 200 MG tablet Take 1 tablet (200 mg total) by mouth 3 (three) times daily.   polyethylene glycol powder (QC NATURA-LAX) 17 GM/SCOOP powder MIX 1 CAPFUL IN 8 0Z OF WATER OR JUICE AND DRINK ONCE A DAY   promethazine (PHENERGAN) 25 MG suppository PLACE 1 SUPPOSITORY RECTALLY EVERY 6 HOURS AS NEEDED FOR NAUSEA OR VOMITING   promethazine (PHENERGAN) 25 MG tablet TAKE 1 TABLET BY MOUTH EVERY 4 HOURS AS NEEDED FOR NAUSEA AND VOMITING   sildenafil (REVATIO) 20 MG tablet Take 3 to 5 tablets two hours before intercouse on an empty stomach.  Do not take with nitrates.   silver sulfADIAZINE (SSD) 1 % cream APPLY TO AFFECTED AREAS TOPICALLY TWICE DAILY  tiZANidine (ZANAFLEX) 4 MG tablet TAKE 2 TABLETS BY MOUTH 4 TIMES DAILY   No facility-administered medications prior to visit.    Review of Systems  All other systems reviewed and are negative.  Except see hpi    Objective    BP 108/71 (BP Location: Right Arm, Patient Position: Sitting, Cuff Size: Large)   Pulse 89   Temp 98.2 F (36.8 C)   SpO2 98%    Physical Exam Vitals reviewed.  Constitutional:      General: He is not in acute distress.    Appearance: Normal appearance. He is not diaphoretic.  HENT:     Head: Normocephalic and atraumatic.     Comments: No facail swelling noted Eyes:     General: No scleral icterus.    Extraocular Movements:  Extraocular movements intact.     Conjunctiva/sclera: Conjunctivae normal.     Pupils: Pupils are equal, round, and reactive to light.  Cardiovascular:     Rate and Rhythm: Normal rate and regular rhythm.     Pulses: Normal pulses.     Heart sounds: Normal heart sounds. No murmur heard. Pulmonary:     Effort: Pulmonary effort is normal. No respiratory distress.     Breath sounds: Normal breath sounds. No wheezing or rhonchi.  Musculoskeletal:     Cervical back: Neck supple.     Right lower leg: No edema.     Left lower leg: No edema.  Lymphadenopathy:     Cervical: No cervical adenopathy.  Skin:    General: Skin is warm and dry.     Findings: No rash.     Comments: Firm, minimally rough, 1- to 2-mm, follicle-based papules. Distribution is symmetric. Noted on the lateral aspects of the arms     Neurological:     Mental Status: He is alert and oriented to person, place, and time. Mental status is at baseline.  Psychiatric:        Behavior: Behavior normal.        Thought Content: Thought content normal.        Judgment: Judgment normal.      No results found for any visits on 10/17/22.  Assessment & Plan     1. Facial swelling Resolved problem? No facial swelling on PE No redness, no warmth on touch, no tenderness or no pruritus Recommended to follow up with series of photos taking in the same setting, using the same lighting, with the same pose/angle& the same camera. No clear trigger, no breathing problem, no associated rash Will monitor  2. Rash non pruritic Non tender on PE Could be due to eczema or keratosis pilaris Daily measures to prevent dry skin, such as using mild cleansers, along with moisturizers, are the mainstay of treatment Lactic acid 12% creams/lotions (e.g., ammonium lactate: AmLactin Ultra, Lac-Hydrin) Urea (in 40-50% topical preparations) Was scheduled with dermatology for February  FU with Dr. Caryn Section : Needs a new wheelchair   The patient was  advised to call back or seek an in-person evaluation if the symptoms worsen or if the condition fails to improve as anticipated.  I discussed the assessment and treatment plan with the patient. The patient was provided an opportunity to ask questions and all were answered. The patient agreed with the plan and demonstrated an understanding of the instructions.  The entirety of the information documented in the History of Present Illness, Review of Systems and Physical Exam were personally obtained by me. Portions of this information were initially documented  by the [facility] and reviewed by me for thoroughness and accuracy.   I spent 20 Minutes caring for his patient today face to face, reviewing labs, records from another facility, performing a medically appropriate examination and /or evaluation, counseling and educating the patient on skin rashes, facial swelling, documenting in the record .  [name], [facility], [place] [phone] (phone) [phone] (fax)

## 2022-10-17 ENCOUNTER — Encounter: Payer: Self-pay | Admitting: Physician Assistant

## 2022-10-17 ENCOUNTER — Ambulatory Visit (INDEPENDENT_AMBULATORY_CARE_PROVIDER_SITE_OTHER): Payer: Medicare HMO | Admitting: Physician Assistant

## 2022-10-17 VITALS — BP 108/71 | HR 89 | Temp 98.2°F

## 2022-10-17 DIAGNOSIS — R220 Localized swelling, mass and lump, head: Secondary | ICD-10-CM | POA: Diagnosis not present

## 2022-10-17 DIAGNOSIS — R21 Rash and other nonspecific skin eruption: Secondary | ICD-10-CM

## 2022-10-18 ENCOUNTER — Encounter: Payer: Self-pay | Admitting: Physician Assistant

## 2022-10-23 ENCOUNTER — Ambulatory Visit: Payer: Self-pay | Admitting: *Deleted

## 2022-10-23 ENCOUNTER — Other Ambulatory Visit: Payer: Self-pay | Admitting: Family Medicine

## 2022-10-23 DIAGNOSIS — G894 Chronic pain syndrome: Secondary | ICD-10-CM

## 2022-10-23 NOTE — Telephone Encounter (Signed)
My mouth is really dry.   Hard to keep it moist.   I'm drinking a lot of water.   I have ketones in my urine 2 weeks ago when I was at the hospital. I have autonomic dystonia.    I have a head cold that I got at the hospital.    I'm having spasms.   I'm drinking Gatorade.    No diarrhea or vomiting.     I'm having to cath myself 6-7 times a day.   I don't know why I'm having spasms.     I'm looking to get into a pain clinic.  Pt had multiple multiple issues.   I finally suggested I make him an appt. Because Dr. Caryn Section was the one that was going to need to assist him with these issues.  Appt made for 10/26/2022 for a video visit with Dr. Caryn Section.    No protocol as he randomly talked about different issues.

## 2022-10-23 NOTE — Telephone Encounter (Signed)
Message from [place] sent at 10/23/2022 12:58 PM EST  Summary: Pt requests call back to advise of what he can take or drink for dehydration.   Pt requests call back to advise of what he can take or drink for dehydration. Pt stated he is drinking Gatorade but it has salt so he would like a call back to discuss other options. Cb# [phone]          Call History   Type Contact Phone/Fax User  10/23/2022 12:52 PM EST Phone (Incoming) [place], [place] "Winferd Humphrey" (Self) [phone] Jerilynn Mages) Leonides Schanz, [place]

## 2022-10-23 NOTE — Telephone Encounter (Signed)
Patient called in to follow up on request. Advised patient received request from Pharmacy

## 2022-10-23 NOTE — Telephone Encounter (Signed)
I'm looking to get in with a pain clinic.

## 2022-10-24 DIAGNOSIS — R339 Retention of urine, unspecified: Secondary | ICD-10-CM | POA: Diagnosis not present

## 2022-10-26 ENCOUNTER — Telehealth (INDEPENDENT_AMBULATORY_CARE_PROVIDER_SITE_OTHER): Payer: Medicare HMO | Admitting: Family Medicine

## 2022-10-26 DIAGNOSIS — N390 Urinary tract infection, site not specified: Secondary | ICD-10-CM | POA: Diagnosis not present

## 2022-10-26 DIAGNOSIS — G894 Chronic pain syndrome: Secondary | ICD-10-CM

## 2022-10-26 DIAGNOSIS — F4542 Pain disorder with related psychological factors: Secondary | ICD-10-CM | POA: Diagnosis not present

## 2022-10-26 DIAGNOSIS — R69 Illness, unspecified: Secondary | ICD-10-CM | POA: Diagnosis not present

## 2022-10-26 MED ORDER — CEFDINIR 300 MG PO CAPS: 600.0000 mg | ORAL_CAPSULE | Freq: Every day | ORAL | 0 refills | Status: DC

## 2022-10-26 MED ORDER — OXYCODONE HCL 15 MG PO TABS: ORAL_TABLET | ORAL | Status: DC

## 2022-10-26 NOTE — Progress Notes (Unsigned)
[place] Ponder Kyrie Bun,acting as a scribe for Lelon Huh, MD.,have documented all relevant documentation on the behalf of Lelon Huh, MD,as directed by  Lelon Huh, MD while in the presence of Lelon Huh, MD.   MyChart Video Visit    Virtual Visit via Video Note   This format is felt to be most appropriate for this patient at this time. Physical exam was limited by quality of the video and audio technology used for the visit.   Patient location: home Provider location: office  I discussed the limitations of evaluation and management by telemedicine and the availability of in person appointments. The patient expressed understanding and agreed to proceed.  Patient: Justin [name]   DOB: 08/31/1981   42 y.o. Male  MRN: [id number] Visit Date: 10/26/2022  Today's healthcare provider: Lelon Huh, MD   No chief complaint on file.  Subjective    HPI  Patient has multiple complaints with the worst being leg spasms.  He states that the spasms are getting worse and causing breakthrough pain.  His pain medication is only lasting 5-5.5 hours at a time.  When at it's worst his pain scale is 8/10 and after medication he can get relief to 4/10.   He did try calling [facility] about the pain management clinic but was told they are still reviewing things and have not made a decision.  Patient also complains of head cold symptoms for the last several days.  He has been taking Robitussin cough and cold with little relief.   Patient also states that he does not know if maybe he has a UTI.  His urine is clear but he notes increased enuresis which usually happens with his infections.     Medications: Outpatient Medications Prior to Visit  Medication Sig   albuterol (VENTOLIN HFA) 108 (90 Base) MCG/ACT inhaler Inhale 2 puffs into the lungs every 6 (six) hours as needed for wheezing or shortness of breath.   alprazolam (XANAX) 2 MG tablet TAKE ONE TABLET EVERY SIX HOURS IF NEEDED    amphetamine-dextroamphetamine (ADDERALL) 15 MG tablet TAKE ONE (1) TABLET BY MOUTH TWO TIMES PER DAY   azelastine (OPTIVAR) 0.05 % ophthalmic solution Place 1 drop into both eyes 2 (two) times daily.   baclofen (LIORESAL) 20 MG tablet TAKE ONE TABLET FOUR TIMES DAILY   diphenhydrAMINE (BENADRYL) 50 MG capsule Take 1 capsule 1 hour before procedure   docusate sodium (COLACE) 250 MG capsule Take 1 capsule (250 mg total) by mouth 4 (four) times daily as needed.   dronabinol (MARINOL) 10 MG capsule Take 1 capsule (10 mg total) by mouth every 6 (six) hours as needed.   fesoterodine (TOVIAZ) 8 MG TB24 tablet TAKE 1 TABLET BY MOUTH DAILY   flavoxATE (URISPAS) 100 MG tablet Take 1 tablet (100 mg total) by mouth 3 (three) times daily as needed for bladder spasms.   fluticasone (FLONASE) 50 MCG/ACT nasal spray Place 2 sprays into both nostrils daily.   gabapentin (NEURONTIN) 800 MG tablet Take 1 tablet (800 mg total) by mouth 4 (four) times daily.   ketoconazole (NIZORAL) 2 % cream Apply 1 Application topically 2 (two) times daily.   levothyroxine (SYNTHROID) 88 MCG tablet Take 1 tablet (88 mcg total) by mouth daily.   lidocaine (LMX) 4 % cream Apply 1 Application topically 3 (three) times daily.   linaclotide (LINZESS) 145 MCG CAPS capsule Take 1 capsule (145 mcg total) by mouth daily before breakfast.   methadone (DOLOPHINE) 10 MG tablet TAKE  2 TABLETS BY MOUTH EVERY 6 HOURS   Mineral Oil OIL daily.   naproxen sodium (ANAPROX) 550 MG tablet TAKE 1 TABLET BY MOUTH 2 TIMES DAILY WITH A MEAL.   omeprazole (PRILOSEC) 20 MG capsule TAKE 1 CAPSULE BY MOUTH ONCE DAILY   oxybutynin (DITROPAN) 5 MG tablet Take 5 mg by mouth daily as needed.   oxyCODONE (ROXICODONE) 15 MG immediate release tablet TAKE 1 TABLET BY MOUTH EVERY 6 HOUR AS NEEDED FOR PAIN   phenazopyridine (PYRIDIUM) 200 MG tablet Take 1 tablet (200 mg total) by mouth 3 (three) times daily.   polyethylene glycol powder (QC NATURA-LAX) 17 GM/SCOOP  powder MIX 1 CAPFUL IN 8 0Z OF WATER OR JUICE AND DRINK ONCE A DAY   promethazine (PHENERGAN) 25 MG suppository PLACE 1 SUPPOSITORY RECTALLY EVERY 6 HOURS AS NEEDED FOR NAUSEA OR VOMITING   promethazine (PHENERGAN) 25 MG tablet TAKE 1 TABLET BY MOUTH EVERY 4 HOURS AS NEEDED FOR NAUSEA AND VOMITING   sildenafil (REVATIO) 20 MG tablet Take 3 to 5 tablets two hours before intercouse on an empty stomach.  Do not take with nitrates.   silver sulfADIAZINE (SSD) 1 % cream APPLY TO AFFECTED AREAS TOPICALLY TWICE DAILY   tiZANidine (ZANAFLEX) 4 MG tablet TAKE 2 TABLETS BY MOUTH 4 TIMES DAILY   No facility-administered medications prior to visit.    Review of Systems  Constitutional:  Positive for appetite change (decreased) and chills. Negative for diaphoresis, fatigue and fever.  HENT:  Positive for congestion, mouth sores, postnasal drip, rhinorrhea, sinus pain, sore throat and voice change. Negative for ear discharge, ear pain, facial swelling, hearing loss, nosebleeds, sinus pressure, sneezing, tinnitus and trouble swallowing.   Eyes:  Negative for photophobia, pain, discharge, redness, itching and visual disturbance.  Respiratory:  Positive for cough (productive). Negative for chest tightness, shortness of breath and wheezing.   Cardiovascular:  Negative for chest pain, palpitations and leg swelling.  Gastrointestinal:  Positive for abdominal distention, constipation and nausea. Negative for abdominal pain, anal bleeding, blood in stool, diarrhea, rectal pain and vomiting.  Genitourinary:  Positive for enuresis, frequency, hematuria and urgency. Negative for decreased urine volume, difficulty urinating, dysuria, penile discharge, penile pain, penile swelling, scrotal swelling and testicular pain.  Musculoskeletal:  Positive for arthralgias and myalgias. Negative for back pain, joint swelling, neck pain and neck stiffness.  Neurological:  Positive for headaches. Negative for dizziness and  light-headedness.    {Labs  Heme  Chem  Endocrine  Serology  Results Review (optional):23779}   Objective    BP 108/70   Pulse (!) 110   Temp (!) 96.8 F (36 C)   {Show previous vital signs (optional):23777}   Physical Exam     Assessment & Plan     ***  No follow-ups on file.     I discussed the assessment and treatment plan with the patient. The patient was provided an opportunity to ask questions and all were answered. The patient agreed with the plan and demonstrated an understanding of the instructions.   The patient was advised to call back or seek an in-person evaluation if the symptoms worsen or if the condition fails to improve as anticipated.  I provided *** minutes of non-face-to-face time during this encounter.  {provider attestation***:1}  Lelon Huh, MD [place] [phone] (phone) [phone] (fax)  [place]

## 2022-11-04 ENCOUNTER — Other Ambulatory Visit: Payer: Self-pay | Admitting: Family Medicine

## 2022-11-04 DIAGNOSIS — F988 Other specified behavioral and emotional disorders with onset usually occurring in childhood and adolescence: Secondary | ICD-10-CM

## 2022-11-04 DIAGNOSIS — G894 Chronic pain syndrome: Secondary | ICD-10-CM

## 2022-11-06 ENCOUNTER — Other Ambulatory Visit: Payer: Self-pay | Admitting: Family Medicine

## 2022-11-06 ENCOUNTER — Telehealth: Payer: Self-pay | Admitting: Family Medicine

## 2022-11-06 DIAGNOSIS — R110 Nausea: Secondary | ICD-10-CM

## 2022-11-06 DIAGNOSIS — G894 Chronic pain syndrome: Secondary | ICD-10-CM

## 2022-11-06 DIAGNOSIS — M25559 Pain in unspecified hip: Secondary | ICD-10-CM

## 2022-11-06 NOTE — Telephone Encounter (Addendum)
Pt stated he spoke with [employer] [facility] about the wheelchair. Stated has been discussing this with Ms.Ostwalt, Rx needs to be faxed to [facility]  Fax: [phone].  Per pt request please add pt phone number to Rx so that they will have it: [phone]   Stated he still has not heard back from [facility] and [facility] pain clinics regarding his referral.   Please advise.

## 2022-11-06 NOTE — Telephone Encounter (Signed)
Copied from [place] [id number]. Topic: General - Other >> Nov 06, 2022 12:53 PM Ja-Kwan M wrote: Reason for CRM: Pt called back to report that he contacted [facility] and [facility] pain clinics regarding his referral and he was told that they are still working through referrals from January. Pt stated he does not need the office to contact them for an update. >> Nov 06, 2022  1:08 PM Sabas Sous wrote: Fax number: [phone] Attn: NP Marga Melnick reports that they have not received the referral.

## 2022-11-07 ENCOUNTER — Telehealth: Payer: Self-pay

## 2022-11-07 DIAGNOSIS — G894 Chronic pain syndrome: Secondary | ICD-10-CM

## 2022-11-07 NOTE — Telephone Encounter (Signed)
Refill not due until 2-25

## 2022-11-07 NOTE — Telephone Encounter (Signed)
Copied from [place] [id number]. Topic: General - Other >> Nov 07, 2022 11:23 AM Everette C wrote: Reason for CRM: The patient has called to check on the status of their previously requested methadone (DOLOPHINE) 10 MG tablet [id number] refill   Please contact the patient further when possible

## 2022-11-08 NOTE — Telephone Encounter (Signed)
Patient called back to get an update on his methadone (DOLOPHINE) 10 MG tablet refill. Patient states he will run out Saturday and his pharmacy is not open on Sundays. Patient wants to know if it is possible to go ahead and send rx in. Please follow up with patient.

## 2022-11-09 ENCOUNTER — Encounter: Payer: Self-pay | Admitting: Family Medicine

## 2022-11-09 ENCOUNTER — Other Ambulatory Visit: Payer: Self-pay | Admitting: Family Medicine

## 2022-11-09 DIAGNOSIS — G894 Chronic pain syndrome: Secondary | ICD-10-CM

## 2022-11-09 MED ORDER — METHADONE HCL 10 MG PO TABS: ORAL_TABLET | ORAL | 0 refills | Status: DC

## 2022-11-09 NOTE — Telephone Encounter (Signed)
Can you check with [facility] and see if they got the methadone rx. I sent it several times but keep an message that Transmission failed

## 2022-11-09 NOTE — Addendum Note (Signed)
Addended by: Birdie Sons on: 11/09/2022 03:36 PM   Modules accepted: Orders

## 2022-11-09 NOTE — Telephone Encounter (Signed)
Pt called once again requesting update on the Rx refill request. Pt stated the Rx runs out Saturday night and his pharmacy is not open on Sunday. Pt requesting follow up to advise if Rx will be sent so he has it for Sunday

## 2022-11-09 NOTE — Addendum Note (Signed)
Addended by: Birdie Sons on: 11/09/2022 01:53 PM   Modules accepted: Orders

## 2022-11-16 ENCOUNTER — Other Ambulatory Visit: Payer: Self-pay | Admitting: Family Medicine

## 2022-11-16 DIAGNOSIS — G894 Chronic pain syndrome: Secondary | ICD-10-CM

## 2022-11-16 NOTE — Telephone Encounter (Signed)
Medication Refill - Medication: oxyCODONE (ROXICODONE) 15 MG immediate release tablet  patient states Dr Caryn Section updated this to 5 pills Has the patient contacted their pharmacy? No because controlled substance (Agent: If no, request that the patient contact the pharmacy for the refill. If patient does not wish to contact the pharmacy document the reason why and proceed with request.) (Agent: If yes, when and what did the pharmacy advise?)contact pcp  Preferred Pharmacy (with phone number or street name):  [place], [place] - [place] Phone: [phone]  Fax: [phone]     Has the patient been seen for an appointment in the last year OR does the patient have an upcoming appointment? yes  Agent: Please be advised that RX refills may take up to 3 business days. We ask that you follow-up with your pharmacy.

## 2022-11-19 ENCOUNTER — Telehealth: Payer: Self-pay

## 2022-11-19 ENCOUNTER — Other Ambulatory Visit: Payer: Self-pay | Admitting: Family Medicine

## 2022-11-19 DIAGNOSIS — G894 Chronic pain syndrome: Secondary | ICD-10-CM

## 2022-11-19 MED ORDER — OXYCODONE HCL 15 MG PO TABS: ORAL_TABLET | ORAL | 0 refills | Status: DC

## 2022-11-19 NOTE — Telephone Encounter (Signed)
Pt called for update on request, says he takes his last dose today if he needs it

## 2022-11-19 NOTE — Telephone Encounter (Signed)
Copied from [place] [id number]. Topic: General - Other >> Nov 19, 2022 11:29 AM Sabas Sous wrote: Reason for CRM: Pt called requesting follow up on fax request to [facility] for his wheelchair  Pt wants to provide the local customer care center: Says he spoke to them and was told that all they are missing is the prescription.   Phone [place]: [phone] or [phone] (toll free)  Fax: [phone]

## 2022-11-19 NOTE — Telephone Encounter (Signed)
Copied from [place] [id number]. Topic: Referral - Status >> Nov 19, 2022 11:24 AM Sabas Sous wrote: Reason for CRM: Pt wants to know the status of his referral request for pain management, he says he provided the office with another fax number for [place].  Also wants to request to have his neurology referral for [facility] with Dr. Manuella Ghazi reopened.

## 2022-11-19 NOTE — Telephone Encounter (Signed)
Requested medication (s) are due for refill today: yes  Requested medication (s) are on the active medication list: yes  Last refill:  10/26/22  Future visit scheduled: yes  Notes to clinic:  Unable to refill per protocol, cannot delegate.      Requested Prescriptions  Pending Prescriptions Disp Refills   oxyCODONE (ROXICODONE) 15 MG immediate release tablet 30 tablet     Sig: Take one tablet every 4-5 hours as needed, no more than 5 in a day     Not Delegated - Analgesics:  Opioid Agonists Failed - 11/19/2022 11:23 AM      Failed - This refill cannot be delegated      Failed - Urine Drug Screen completed in last 360 days      Passed - Valid encounter within last 3 months    Recent Outpatient Visits           3 weeks ago Chronic pain associated with significant psychosocial dysfunction   [place], Donald E, MD   1 month ago Facial swelling   [place], [place], PA-C   1 month ago Muscle spasms of both lower extremities   [place], [place], PA-C   7 months ago Pelvic pain   [place], Donald E, MD   7 months ago Muscle spasm of right lower extremity   [place], Donald E, MD       Future Appointments             In 5 months Brendolyn Patty, MD [place]

## 2022-11-22 DIAGNOSIS — R339 Retention of urine, unspecified: Secondary | ICD-10-CM | POA: Diagnosis not present

## 2022-11-23 ENCOUNTER — Other Ambulatory Visit: Payer: Self-pay | Admitting: Family Medicine

## 2022-11-23 ENCOUNTER — Ambulatory Visit: Payer: Self-pay | Admitting: *Deleted

## 2022-11-23 DIAGNOSIS — G894 Chronic pain syndrome: Secondary | ICD-10-CM

## 2022-11-23 NOTE — Telephone Encounter (Signed)
Summary: Justin [name] cloudy urine   Patient states that he started having brown cloudy urine yesterday. Patient would like to know if it is ok for wife to drop off a urine sample.

## 2022-11-23 NOTE — Telephone Encounter (Signed)
  Chief Complaint: urinary symptoms- wants to bring sample Symptoms: dark cloudy urine with odor Frequency: na Pertinent Negatives: Patient denies pain, fever Disposition: [] ED /[] Urgent Care (no appt availability in office) / [] Appointment(In office/virtual)/ []  [facility]/ [] Home Care/ [] Refused Recommended Disposition /[] [place] [facility]/ [x]  Follow-up with PCP Additional Notes: Patient is calling to request urine culture- he wants to wait on medication- only if needed. Patient also wants to let PCP know he has requested a RF on his pain medication- he was advised he needed appointment and he did schedule for Monday- he wants to make sure he really needs this appointment too. Please advise.  Reason for Disposition  Bad or foul-smelling urine  Answer Assessment - Initial Assessment Questions 1. SYMPTOM: "What's the main symptom you're concerned about?" (e.g., frequency, incontinence)     Dark cloudy urine with odor 2. ONSET: "When did the  symptoms  start?"     Last night- patient does self catheter  3. PAIN: "Is there any pain?" If Yes, ask: "How bad is it?" (Scale: 1-10; mild, moderate, severe)     No pain 4. CAUSE: "What do you think is causing the symptoms?"     Possible UTI 5. OTHER SYMPTOMS: "Do you have any other symptoms?" (e.g., blood in urine, fever, flank pain, pain with urination)     Possible blood in urine- dark, cloudy, odor  Protocols used: Urinary Symptoms-A-AH

## 2022-11-23 NOTE — Telephone Encounter (Signed)
Requested medication (s) are due for refill today: yes  Requested medication (s) are on the active medication list: yes  Last refill:  11/19/22  Future visit scheduled: yes  Notes to clinic:  Unable to refill per protocol, cannot delegate.      Requested Prescriptions  Pending Prescriptions Disp Refills   oxyCODONE (ROXICODONE) 15 MG immediate release tablet 30 tablet 0    Sig: Take one tablet every 4-5 hours as needed, no more than 5 in a day     Not Delegated - Analgesics:  Opioid Agonists Failed - 11/23/2022  1:27 PM      Failed - This refill cannot be delegated      Failed - Urine Drug Screen completed in last 360 days      Passed - Valid encounter within last 3 months    Recent Outpatient Visits           4 weeks ago Chronic pain associated with significant psychosocial dysfunction   [place], Donald E, MD   1 month ago Facial swelling   [place], [place], PA-C   1 month ago Muscle spasms of both lower extremities   [place], [place], PA-C   7 months ago Pelvic pain   [place], Donald E, MD   7 months ago Muscle spasm of right lower extremity   [place], Donald E, MD       Future Appointments             In 5 months Brendolyn Patty, MD [place]

## 2022-11-23 NOTE — Telephone Encounter (Signed)
Medication Refill - Medication: oxyCODONE (ROXICODONE) 15 MG immediate release tablet   Has the patient contacted their pharmacy? No. (Agent: If no, request that the patient contact the pharmacy for the refill. If patient does not wish to contact the pharmacy document the reason why and proceed with request.) (Agent: If yes, when and what did the pharmacy advise?)  Preferred Pharmacy (with phone number or street name):  [place], [place] Phone: [phone]  Fax: [phone]     Has the patient been seen for an appointment in the last year OR does the patient have an upcoming appointment? Yes.    Agent: Please be advised that RX refills may take up to 3 business days. We ask that you follow-up with your pharmacy.  *Patient wants to know if pcp still wants patient to be seen before refilling monthly rx. Please follow up with patient.

## 2022-11-26 ENCOUNTER — Telehealth (INDEPENDENT_AMBULATORY_CARE_PROVIDER_SITE_OTHER): Payer: Medicare HMO | Admitting: Family Medicine

## 2022-11-26 ENCOUNTER — Encounter: Payer: Self-pay | Admitting: Family Medicine

## 2022-11-26 ENCOUNTER — Ambulatory Visit: Payer: Medicare HMO | Admitting: Physician Assistant

## 2022-11-26 ENCOUNTER — Telehealth: Payer: Self-pay

## 2022-11-26 VITALS — BP 123/85 | HR 81

## 2022-11-26 DIAGNOSIS — S24109S Unspecified injury at unspecified level of thoracic spinal cord, sequela: Secondary | ICD-10-CM

## 2022-11-26 DIAGNOSIS — G894 Chronic pain syndrome: Secondary | ICD-10-CM | POA: Diagnosis not present

## 2022-11-26 DIAGNOSIS — K5909 Other constipation: Secondary | ICD-10-CM | POA: Diagnosis not present

## 2022-11-26 DIAGNOSIS — N390 Urinary tract infection, site not specified: Secondary | ICD-10-CM | POA: Diagnosis not present

## 2022-11-26 DIAGNOSIS — G8220 Paraplegia, unspecified: Secondary | ICD-10-CM | POA: Diagnosis not present

## 2022-11-26 LAB — POCT URINALYSIS DIPSTICK
Bilirubin, UA: NEGATIVE
Glucose, UA: NEGATIVE
Ketones, UA: NEGATIVE
Nitrite, UA: NEGATIVE
Protein, UA: NEGATIVE
Spec Grav, UA: >=1.03 — AB (ref 1.010–1.025)
Urobilinogen, UA: 0.2 E.U./dL
pH, UA: 6 (ref 5.0–8.0)

## 2022-11-26 MED ORDER — CEFDINIR 300 MG PO CAPS: 600.0000 mg | ORAL_CAPSULE | Freq: Every day | ORAL | 0 refills | Status: AC

## 2022-11-26 MED ORDER — OXYCODONE HCL 15 MG PO TABS: ORAL_TABLET | ORAL | 0 refills | Status: DC

## 2022-11-26 MED ORDER — LUBIPROSTONE 24 MCG PO CAPS: 24.0000 ug | ORAL_CAPSULE | Freq: Two times a day (BID) | ORAL | 3 refills | Status: DC

## 2022-11-26 NOTE — Telephone Encounter (Signed)
Copied from [place] [phone]. Topic: General - Other >> Nov 26, 2022 12:19 PM [place] B wrote: Reason for CRM: Patient want to know if his wife can bring a sample of his urine to the office, so he can get antibiotics because its too painful for him to get out for an appt today. He is worried he may fall trying to get out. Please call back

## 2022-11-26 NOTE — Progress Notes (Addendum)
MyChart Video Visit    Virtual Visit via Video Note   This format is felt to be most appropriate for this patient at this time. Physical exam was limited by quality of the video and audio technology used for the visit.   Patient location: home Provider location: bfp  I discussed the limitations of evaluation and management by telemedicine and the availability of in person appointments. The patient expressed understanding and agreed to proceed.     Patient: Justin [name]   DOB: 10-04-1980   42 y.o. Male  MRN: [id number] Visit Date: 11/26/2022  Today's healthcare provider: Mila Merry, MD   No chief complaint on file.  Subjective    HPI HPI   Bladder--spasm, odor, cloudy- 4 days. Denied fever. Last edited by Shelly Bombard, CMA on 11/26/2022  2:21 PM.      Has < 15 year wheels chair. Needs order for new electric wheelchair to [employer]. Advsie to leave message if he doesn't answer.   States that [place] worked much better than linzess. Would like to change back to [place]   Outpatient Medications Prior to Visit  Medication Sig   albuterol (VENTOLIN HFA) 108 (90 Base) MCG/ACT inhaler Inhale 2 puffs into the lungs every 6 (six) hours as needed for wheezing or shortness of breath.   alprazolam (XANAX) 2 MG tablet TAKE ONE TABLET EVERY SIX HOURS IF NEEDED   amphetamine-dextroamphetamine (ADDERALL) 15 MG tablet TAKE ONE (1) TABLET BY MOUTH TWO TIMES PER DAY   azelastine (OPTIVAR) 0.05 % ophthalmic solution Place 1 drop into both eyes 2 (two) times daily.   baclofen (LIORESAL) 20 MG tablet TAKE ONE TABLET FOUR TIMES DAILY   diphenhydrAMINE (BENADRYL) 50 MG capsule Take 1 capsule 1 hour before procedure   docusate sodium (COLACE) 250 MG capsule Take 1 capsule (250 mg total) by mouth 4 (four) times daily as needed.   dronabinol (MARINOL) 10 MG capsule Take 1 capsule (10 mg total) by mouth every 6 (six) hours as needed.   fesoterodine (TOVIAZ) 8 MG TB24  tablet TAKE 1 TABLET BY MOUTH DAILY   flavoxATE (URISPAS) 100 MG tablet Take 1 tablet (100 mg total) by mouth 3 (three) times daily as needed for bladder spasms.   fluticasone (FLONASE) 50 MCG/ACT nasal spray Place 2 sprays into both nostrils daily.   gabapentin (NEURONTIN) 800 MG tablet Take 1 tablet (800 mg total) by mouth 4 (four) times daily.   ketoconazole (NIZORAL) 2 % cream Apply 1 Application topically 2 (two) times daily.   levothyroxine (SYNTHROID) 88 MCG tablet Take 1 tablet (88 mcg total) by mouth daily.   lidocaine (LMX) 4 % cream Apply 1 Application topically 3 (three) times daily.   LINZESS 145 MCG CAPS capsule TAKE 1 CAPSULE BY MOUTH DAILY BEFORE BREAKFAST   methadone (DOLOPHINE) 10 MG tablet TAKE 2 TABLETS BY MOUTH EVERY 6 HOURS   Mineral Oil OIL daily.   naproxen sodium (ANAPROX) 550 MG tablet TAKE 1 TABLET BY MOUTH 2 TIMES DAILY WITH A MEAL.   omeprazole (PRILOSEC) 20 MG capsule TAKE 1 CAPSULE BY MOUTH ONCE DAILY   oxybutynin (DITROPAN) 5 MG tablet Take 5 mg by mouth daily as needed.   oxyCODONE (ROXICODONE) 15 MG immediate release tablet Take one tablet every 4-5 hours as needed, no more than 5 in a day   phenazopyridine (PYRIDIUM) 200 MG tablet Take 1 tablet (200 mg total) by mouth 3 (three) times daily.   polyethylene  glycol powder (QC NATURA-LAX) 17 GM/SCOOP powder MIX 1 CAPFUL IN 8 0Z OF WATER OR JUICE AND DRINK ONCE A DAY   promethazine (PHENERGAN) 25 MG suppository PLACE 1 SUPPOSITORY RECTALLY EVERY 6 HOURS AS NEEDED FOR NAUSEA OR VOMITING   promethazine (PHENERGAN) 25 MG tablet TAKE 1 TABLET BY MOUTH EVERY 4 HOURS AS NEEDED FOR NAUSEA AND VOMITING   sildenafil (REVATIO) 20 MG tablet Take 3 to 5 tablets two hours before intercouse on an empty stomach.  Do not take with nitrates.   silver sulfADIAZINE (SSD) 1 % cream APPLY TO AFFECTED AREAS TOPICALLY TWICE DAILY   tiZANidine (ZANAFLEX) 4 MG tablet TAKE 2 TABLETS BY MOUTH 4 TIMES DAILY   No facility-administered  medications prior to visit.    Review of Systems  Constitutional:  Negative for chills, fatigue and fever.       Objective    BP 123/85 Comment: home b/p  Pulse 81 Comment: home     Results for orders placed or performed in visit on 11/26/22  POCT urinalysis dipstick  Result Value Ref Range   Color, UA yellow    Clarity, UA cloudy    Glucose, UA Negative Negative   Bilirubin, UA neg    Ketones, UA neg    Spec Grav, UA >=1.030 (A) 1.010 - 1.025   Blood, UA moderate    pH, UA 6.0 5.0 - 8.0   Protein, UA Negative Negative   Urobilinogen, UA 0.2 0.2 or 1.0 E.U./dL   Nitrite, UA neg    Leukocytes, UA Large (3+) (A) Negative   Appearance     Odor      Assessment & Plan     1. Recurrent UTI  - CULTURE, URINE COMPREHENSIVE  2. Chronic UTI  - cefdinir (OMNICEF) 300 MG capsule; Take 2 capsules (600 mg total) by mouth daily for 10 days.  Dispense: 20 capsule; Refill: 0 - Urine Culture  3. Chronic pain associated with significant psychosocial dysfunction refill oxyCODONE (ROXICODONE) 15 MG immediate release tablet; Take one tablet every 4-5 hours as needed, no more than 5 in a day  Dispense: 120 tablet; Refill: 0  4. Injury of thoracic spinal cord, sequela (HCC)  - Ambulatory referral to Physical Therapy  5. Paraplegia [facility])  - Ambulatory referral to Physical Therapy  6. Chronic constipation Linzess is not effective, change back to  lubiprostone (AMITIZA) 24 MCG capsule; Take 1 capsule (24 mcg total) by mouth 2 (two) times daily.  Dispense: 180 capsule; Refill: 3       I discussed the assessment and treatment plan with the patient. The patient was provided an opportunity to ask questions and all were answered. The patient agreed with the plan and demonstrated an understanding of the instructions.   The patient was advised to call back or seek an in-person evaluation if the symptoms worsen or if the condition fails to improve as anticipated.  I provided 14 minutes  of non-face-to-face time during this encounter.  Mila Merry, MD  [facility] [phone] (phone) [phone] (fax)  [facility]

## 2022-11-27 ENCOUNTER — Encounter: Payer: Self-pay | Admitting: Family Medicine

## 2022-11-28 NOTE — Telephone Encounter (Signed)
Pharmacy sent fax stating the PA is needed, but there is nothing on CoverMyMeds, please contact pharmacy to initiate PA

## 2022-11-30 LAB — CULTURE, URINE COMPREHENSIVE

## 2022-12-01 ENCOUNTER — Encounter: Payer: Self-pay | Admitting: Family Medicine

## 2022-12-06 ENCOUNTER — Ambulatory Visit: Payer: Self-pay

## 2022-12-06 ENCOUNTER — Other Ambulatory Visit: Payer: Self-pay | Admitting: Family Medicine

## 2022-12-06 DIAGNOSIS — F988 Other specified behavioral and emotional disorders with onset usually occurring in childhood and adolescence: Secondary | ICD-10-CM

## 2022-12-06 DIAGNOSIS — G894 Chronic pain syndrome: Secondary | ICD-10-CM

## 2022-12-06 NOTE — Telephone Encounter (Signed)
Requested medication (s) are due for refill today: yes  Requested medication (s) are on the active medication list: yes  Last refill:  11/09/22  Future visit scheduled: yes  Notes to clinic:  Unable to refill per protocol, cannot delegate.      Requested Prescriptions  Pending Prescriptions Disp Refills   methadone (DOLOPHINE) 10 MG tablet 240 tablet 0    Sig: TAKE 2 TABLETS BY MOUTH EVERY 6 HOURS     Not Delegated - Analgesics:  Opioid Agonists - methadone hydrochloride Failed - 12/06/2022  9:43 AM      Failed - This refill cannot be delegated      Failed - Urine Drug Screen completed in last 360 days      Passed - Cr in normal range and within 360 days    Creatinine  Date Value Ref Range Status  06/28/2014 0.82 0.60 - 1.30 mg/dL Final   Creatinine, Ser  Date Value Ref Range Status  10/02/2022 0.83 0.61 - 1.24 mg/dL Final         Passed - AST in normal range and within 360 days    AST  Date Value Ref Range Status  10/02/2022 27 15 - 41 U/L Final   SGOT(AST)  Date Value Ref Range Status  06/25/2014 15 15 - 37 Unit/L Final         Passed - ALT in normal range and within 360 days    ALT  Date Value Ref Range Status  10/02/2022 23 0 - 44 U/L Final   SGPT (ALT)  Date Value Ref Range Status  06/25/2014 18 U/L Final    Comment:    14-63 NOTE: New Reference Range 04/06/14          Passed - Valid encounter within last 3 months    Recent Outpatient Visits           1 week ago Recurrent UTI   [facility] Birdie Sons, MD   1 month ago Chronic pain associated with significant psychosocial dysfunction   [place], Donald E, MD   1 month ago Facial swelling   [place], [place], PA-C   2 months ago Muscle spasms of both lower extremities   [place], [place], PA-C   7 months ago Pelvic pain   [place], Donald E, MD       Future Appointments             In 4 months Brendolyn Patty, MD [place]             amphetamine-dextroamphetamine (ADDERALL) 15 MG tablet 60 tablet 0     Not Delegated - Psychiatry:  Stimulants/ADHD Failed - 12/06/2022  9:43 AM      Failed - This refill cannot be delegated      Failed - Urine Drug Screen completed in last 360 days      Passed - Last BP in normal range    BP Readings from Last 1 Encounters:  11/26/22 123/85         Passed - Last Heart Rate in normal range    Pulse Readings from Last 1 Encounters:  11/26/22 81         Passed - Valid encounter within last 6 months    Recent Outpatient Visits           1 week  ago Recurrent UTI   [place], MD   1 month ago Chronic pain associated with significant psychosocial dysfunction   [place], Donald E, MD   1 month ago Facial swelling   [place], [place], [place]   2 months ago Muscle spasms of both lower extremities   [place], [place], PA-C   7 months ago Pelvic pain   [place], Donald E, MD       Future Appointments             In 4 months Brendolyn Patty, MD [place]

## 2022-12-06 NOTE — Telephone Encounter (Signed)
Medication Refill - Medication:  methadone (DOLOPHINE) 10 MG tablet amphetamine-dextroamphetamine (ADDERALL) 15 MG tablet    Has the patient contacted their pharmacy? No. (Agent: If no, request that the patient contact the pharmacy for the refill. If patient does not wish to contact the pharmacy document the reason why and proceed with request.) (Agent: If yes, when and what did the pharmacy advise?)  Preferred Pharmacy (with phone number or street name):  [place], [place] - [address] Phone: [phone]  Fax: [phone]     Has the patient been seen for an appointment in the last year OR does the patient have an upcoming appointment? Yes.    Agent: Please be advised that RX refills may take up to 3 business days. We ask that you follow-up with your pharmacy.

## 2022-12-06 NOTE — Telephone Encounter (Signed)
  Chief Complaint: UTI FU Symptoms: cloudy urine and still having spasms  Frequency: ongoing  Pertinent Negatives: Pt denies urine odor Disposition: [] ED /[] Urgent Care (no appt availability in office) / [] Appointment(In office/virtual)/ []  [employer] Virtual Care/ [] Home Care/ [] Refused Recommended Disposition /[]  Mobile Bus/ [x]  Follow-up with PCP Additional Notes: pt will complete Omnicef today for UTI and was just wanting to make sure it was going to clear up infection. States cloudiness wasn't as bad this morning so will continue to monitor but didn't know he needed another abx or not. Advised him that Carole Civil should be good to clear up infection but would send message back to provider for review and for him to just monitor sx and if any get worse to FU with office. Pt was also asking about Oxycodone refill, he was sent #120 on 11/26/22 and takes 5x /day so advised him he shouldn't be due for refill until close to 12/21/22, advised pt he can call back around 12/14/22 or 12/17/22 to start refill. Pt has already request other refills on other medications needed. Pt was also advised of message from [place], [place] from 12/03/22 on he needing to FU with pharmacy on [address] to see if they received refusal on PA so office could reach out to insurance. Pt state he will take care of that today. No further assistance needed.   Summary: Cloudy Urine   Patient states that today is the last day of a 10 day course of cefdinir (OMNICEF) 300 MG capsule. Patient states his urine is still a little cloudy.       Reason for Disposition  [1] Cloudy urine lasts > 24 hours AND [2] not cleared by increased fluid intake  Answer Assessment - Initial Assessment Questions 1. SYMPTOMS: "What symptoms are you concerned about?"     Urine  2. ONSET:  "When did the symptoms start?"     11/26/22 4. ABDOMEN PAIN: "Is there any abdomen pain?" (e.g., Scale 1-10; or mild, moderate, severe)     no 5. URINE COLOR: "What color is  the urine?"  "Is there blood present in the urine?" (e.g., clear, yellow, cloudy, tea-colored, blood streaks, bright red)     Clear  6. URINE AMOUNT: "When did you last empty the urine from the collection bag?" "How much urine was in the bag at that time?" How much urine is in the collection bag now?"     Small amount  7. INSERTION: "How long have you (they) had the catheter?"  I&O  8. OTHER SYMPTOMS: "Are there any other symptoms?" (e.g., abdomen swelling, back pain, bladder spasms, constipation, foul smelling urine, leaking of urine)      Cloudiness  9. MEDICINES: "Are you taking any medicines to treat urinary problems?" (e.g., antibiotics for a urinary tract infection, medicines to treat bladder spasms)      Omnicef 300mg  X 10 days  Protocols used: Urinary Catheter (e.g., Foley) Symptoms and Questions-A-AH

## 2022-12-07 ENCOUNTER — Encounter: Payer: Self-pay | Admitting: Family Medicine

## 2022-12-07 MED ORDER — AMPHETAMINE-DEXTROAMPHETAMINE 15 MG PO TABS: ORAL_TABLET | ORAL | 0 refills | Status: DC

## 2022-12-07 MED ORDER — METHADONE HCL 10 MG PO TABS: ORAL_TABLET | ORAL | 0 refills | Status: DC

## 2022-12-07 NOTE — Addendum Note (Signed)
Addended by: Birdie Sons on: 12/07/2022 05:28 PM   Modules accepted: Orders

## 2022-12-10 ENCOUNTER — Other Ambulatory Visit: Payer: Self-pay | Admitting: Family Medicine

## 2022-12-11 ENCOUNTER — Ambulatory Visit: Payer: Self-pay

## 2022-12-11 NOTE — Telephone Encounter (Signed)
Requested Prescriptions  Pending Prescriptions Disp Refills   promethazine (PHENERGAN) 25 MG tablet [Pharmacy Med Name: PROMETHAZINE HCL 25 MG TAB] 30 tablet 2    Sig: TAKE 1 TABLET BY MOUTH EVERY 4 HOURS AS NEEDED FOR NAUSEA AND VOMITING     Not Delegated - Gastroenterology: Antiemetics Failed - 12/10/2022  9:13 AM      Failed - This refill cannot be delegated      Passed - Valid encounter within last 6 months    Recent Outpatient Visits           2 weeks ago Recurrent UTI   [facility] Birdie Sons, MD   1 month ago Chronic pain associated with significant psychosocial dysfunction   [place], Donald E, MD   1 month ago Facial swelling   [place], [place], PA-C   2 months ago Muscle spasms of both lower extremities   [place], [place], PA-C   7 months ago Pelvic pain   [place], Donald E, MD       Future Appointments             In 4 months Brendolyn Patty, MD [place]             levothyroxine (SYNTHROID) 88 MCG tablet [Pharmacy Med Name: LEVOTHYROXINE SODIUM 88 MCG TAB] 30 tablet 12    Sig: TAKE ONE TABLET BY MOUTH EVERY DAY     Endocrinology:  Hypothyroid Agents Passed - 12/10/2022  9:13 AM      Passed - TSH in normal range and within 360 days    TSH  Date Value Ref Range Status  04/20/2022 3.160 0.450 - 4.500 uIU/mL Final         Passed - Valid encounter within last 12 months    Recent Outpatient Visits           2 weeks ago Recurrent UTI   [facility] Birdie Sons, MD   1 month ago Chronic pain associated with significant psychosocial dysfunction   [place], Donald E, MD   1 month ago Facial swelling   [place], [place], PA-C   2 months ago Muscle spasms of  both lower extremities   [place], [place], PA-C   7 months ago Pelvic pain   [place], Donald E, MD       Future Appointments             In 4 months Brendolyn Patty, MD [place]

## 2022-12-11 NOTE — Telephone Encounter (Signed)
Chief Complaint: UTI symptoms Symptoms: cloudy urine, burning/pain 4/10, frequency, unable to hold urine Frequency: Off and on since 11/26/22 Pertinent Negatives: Patient denies fever, flank pain, other symptoms Disposition: [] ED /[] Urgent Care (no appt availability in office) / [x] Appointment(In office/virtual)/ []  [facility]/ [] Home Care/ [x] Refused Recommended Disposition /[] [place] Mobile Bus/ []  Follow-up with PCP Additional Notes: Patient says he finished the antibiotics on Friday, however he was still having symptoms while on the antibiotics that never cleared up. Advised an OV, he refused saying he's only allowed so many medicare visits. He is wanting to know if his wife could drop off a urine sample to be tested to see if there is another bug that may have developed. Advised I will send this to Dr. Caryn Section and someone will call back with his recommendation.    Reason for Disposition  [1] Can't control passage of urine (i.e., urinary incontinence) AND [2] new-onset (< 2 weeks) or worsening  Answer Assessment - Initial Assessment Questions 1. SYMPTOM: "What's the main symptom you're concerned about?" (e.g., frequency, incontinence)     Burning, cloudy urine, frequency, unable to hold urine 2. ONSET: "When did the symptoms start?"     Off and on since 3/11 when antibiotics started 3. PAIN: "Is there any pain?" If Yes, ask: "How bad is it?" (Scale: 1-10; mild, moderate, severe)     4 bladder pain 4. CAUSE: "What do you think is causing the symptoms?"     UTI 5. OTHER SYMPTOMS: "Do you have any other symptoms?" (e.g., blood in urine, fever, flank pain, pain with urination)     No  Protocols used: Urinary Symptoms-A-AH

## 2022-12-11 NOTE — Telephone Encounter (Signed)
Requested medication (s) are due for refill today: yes  Requested medication (s) are on the active medication list: yes    Last refill: 11/07/22  #30  2 refills  Future visit scheduled no  Notes to clinic:Not delegated, please review. Thank you.  Requested Prescriptions  Pending Prescriptions Disp Refills   promethazine (PHENERGAN) 25 MG tablet [Pharmacy Med Name: PROMETHAZINE HCL 25 MG TAB] 30 tablet 2    Sig: TAKE 1 TABLET BY MOUTH EVERY 4 HOURS AS NEEDED FOR NAUSEA AND VOMITING     Not Delegated - Gastroenterology: Antiemetics Failed - 12/10/2022  9:13 AM      Failed - This refill cannot be delegated      Passed - Valid encounter within last 6 months    Recent Outpatient Visits           2 weeks ago Recurrent UTI   [facility] Birdie Sons, MD   1 month ago Chronic pain associated with significant psychosocial dysfunction   [place], Donald E, MD   1 month ago Facial swelling   [place], [place], PA-C   2 months ago Muscle spasms of both lower extremities   [place], [place], PA-C   7 months ago Pelvic pain   [place], Donald E, MD       Future Appointments             In 4 months Brendolyn Patty, MD [place]            Signed Prescriptions Disp Refills   levothyroxine (SYNTHROID) 88 MCG tablet 30 tablet 12    Sig: TAKE ONE TABLET BY MOUTH EVERY DAY     Endocrinology:  Hypothyroid Agents Passed - 12/10/2022  9:13 AM      Passed - TSH in normal range and within 360 days    TSH  Date Value Ref Range Status  04/20/2022 3.160 0.450 - 4.500 uIU/mL Final         Passed - Valid encounter within last 12 months    Recent Outpatient Visits           2 weeks ago Recurrent UTI   [facility] Birdie Sons, MD   1 month ago Chronic pain  associated with significant psychosocial dysfunction   [place], Donald E, MD   1 month ago Facial swelling   [place], [place], PA-C   2 months ago Muscle spasms of both lower extremities   [place], [place], PA-C   7 months ago Pelvic pain   [place], Donald E, MD       Future Appointments             In 4 months Brendolyn Patty, MD [place]

## 2022-12-13 ENCOUNTER — Encounter: Payer: Self-pay | Admitting: Family Medicine

## 2022-12-13 NOTE — Telephone Encounter (Signed)
LVMTCB. CRM created. [place] for [employer] to advise per Dr.Fisher  Please advise patient if he is not able to bring specimen within a hour of urinating that it should be refrigerated until it is brought to the office

## 2022-12-13 NOTE — Telephone Encounter (Signed)
Yes, he can have his wife drop off urine sample

## 2022-12-17 ENCOUNTER — Other Ambulatory Visit: Payer: Self-pay | Admitting: Family Medicine

## 2022-12-17 ENCOUNTER — Telehealth: Payer: Self-pay | Admitting: Family Medicine

## 2022-12-17 DIAGNOSIS — G894 Chronic pain syndrome: Secondary | ICD-10-CM

## 2022-12-17 MED ORDER — TRULANCE 3 MG PO TABS: 3.0000 mg | ORAL_TABLET | Freq: Every day | ORAL | 1 refills | Status: DC

## 2022-12-17 NOTE — Telephone Encounter (Signed)
[facility] requesting prior authorization Key: [id number] Name: [place]] Trulance 3mg  Tablets

## 2022-12-17 NOTE — Telephone Encounter (Signed)
Medication Refill - Medication: Plecanatide (TRULANCE) 3 MG TABS   Has the patient contacted their pharmacy? Yes.   Pharmacy sttd he will need a prior auth before they can fill the meds. Patient also wants Amitza prescribed for constipation that will also need a prior auth. Patient also believes he has a bladder infection and needs to just have a culture done.  Preferred Pharmacy (with phone number or street name):  [place], [place] - [address]  [place], [place] [address]   Has the patient been seen for an appointment in the last year OR does the patient have an upcoming appointment? Yes.    Agent: Please be advised that RX refills may take up to 3 business days. We ask that you follow-up with your pharmacy.

## 2022-12-18 NOTE — Telephone Encounter (Signed)
OK for patient to leave urine sample for culture and u/a

## 2022-12-19 MED ORDER — OXYCODONE HCL 15 MG PO TABS: ORAL_TABLET | ORAL | 0 refills | Status: DC

## 2022-12-21 ENCOUNTER — Other Ambulatory Visit: Payer: Self-pay

## 2022-12-21 ENCOUNTER — Other Ambulatory Visit (INDEPENDENT_AMBULATORY_CARE_PROVIDER_SITE_OTHER): Payer: Medicare HMO

## 2022-12-21 DIAGNOSIS — N390 Urinary tract infection, site not specified: Secondary | ICD-10-CM

## 2022-12-21 DIAGNOSIS — R339 Retention of urine, unspecified: Secondary | ICD-10-CM | POA: Diagnosis not present

## 2022-12-21 LAB — POCT URINALYSIS DIPSTICK
Bilirubin, UA: NEGATIVE
Glucose, UA: NEGATIVE
Ketones, UA: NEGATIVE
Nitrite, UA: NEGATIVE
Protein, UA: POSITIVE — AB
Spec Grav, UA: 1.025 (ref 1.010–1.025)
Urobilinogen, UA: 0.2 E.U./dL
pH, UA: 5 (ref 5.0–8.0)

## 2022-12-21 MED ORDER — CEFDINIR 300 MG PO CAPS: 300.0000 mg | ORAL_CAPSULE | Freq: Two times a day (BID) | ORAL | 0 refills | Status: DC

## 2022-12-21 NOTE — Addendum Note (Signed)
Addended by: Malva Limes on: 12/21/2022 04:31 PM   Modules accepted: Orders

## 2022-12-27 LAB — URINE CULTURE

## 2022-12-29 ENCOUNTER — Emergency Department: Payer: Medicare HMO

## 2022-12-29 ENCOUNTER — Inpatient Hospital Stay
Admission: EM | Admit: 2022-12-29 | Discharge: 2023-01-07 | DRG: 871 | Disposition: A | Discharge status: Home Health | Payer: Medicare HMO | Attending: Internal Medicine | Admitting: Internal Medicine

## 2022-12-29 ENCOUNTER — Inpatient Hospital Stay: Payer: Medicare HMO

## 2022-12-29 ENCOUNTER — Other Ambulatory Visit: Payer: Self-pay

## 2022-12-29 DIAGNOSIS — M4802 Spinal stenosis, cervical region: Secondary | ICD-10-CM | POA: Diagnosis not present

## 2022-12-29 DIAGNOSIS — R000 Tachycardia, unspecified: Secondary | ICD-10-CM | POA: Diagnosis not present

## 2022-12-29 DIAGNOSIS — F32A Depression, unspecified: Secondary | ICD-10-CM | POA: Diagnosis present

## 2022-12-29 DIAGNOSIS — J189 Pneumonia, unspecified organism: Secondary | ICD-10-CM | POA: Diagnosis present

## 2022-12-29 DIAGNOSIS — S24109S Unspecified injury at unspecified level of thoracic spinal cord, sequela: Secondary | ICD-10-CM

## 2022-12-29 DIAGNOSIS — R6521 Severe sepsis with septic shock: Secondary | ICD-10-CM | POA: Diagnosis present

## 2022-12-29 DIAGNOSIS — M62838 Other muscle spasm: Secondary | ICD-10-CM

## 2022-12-29 DIAGNOSIS — M1990 Unspecified osteoarthritis, unspecified site: Secondary | ICD-10-CM | POA: Diagnosis not present

## 2022-12-29 DIAGNOSIS — N3000 Acute cystitis without hematuria: Secondary | ICD-10-CM | POA: Diagnosis not present

## 2022-12-29 DIAGNOSIS — R0689 Other abnormalities of breathing: Secondary | ICD-10-CM | POA: Diagnosis not present

## 2022-12-29 DIAGNOSIS — E876 Hypokalemia: Secondary | ICD-10-CM | POA: Diagnosis not present

## 2022-12-29 DIAGNOSIS — D709 Neutropenia, unspecified: Secondary | ICD-10-CM | POA: Diagnosis not present

## 2022-12-29 DIAGNOSIS — R55 Syncope and collapse: Secondary | ICD-10-CM | POA: Diagnosis not present

## 2022-12-29 DIAGNOSIS — L8990 Pressure ulcer of unspecified site, unspecified stage: Secondary | ICD-10-CM | POA: Diagnosis present

## 2022-12-29 DIAGNOSIS — A419 Sepsis, unspecified organism: Secondary | ICD-10-CM

## 2022-12-29 DIAGNOSIS — L89312 Pressure ulcer of right buttock, stage 2: Secondary | ICD-10-CM | POA: Diagnosis present

## 2022-12-29 DIAGNOSIS — R7989 Other specified abnormal findings of blood chemistry: Secondary | ICD-10-CM | POA: Insufficient documentation

## 2022-12-29 DIAGNOSIS — Z8249 Family history of ischemic heart disease and other diseases of the circulatory system: Secondary | ICD-10-CM

## 2022-12-29 DIAGNOSIS — D696 Thrombocytopenia, unspecified: Secondary | ICD-10-CM | POA: Diagnosis present

## 2022-12-29 DIAGNOSIS — N179 Acute kidney failure, unspecified: Secondary | ICD-10-CM | POA: Diagnosis not present

## 2022-12-29 DIAGNOSIS — Z7722 Contact with and (suspected) exposure to environmental tobacco smoke (acute) (chronic): Secondary | ICD-10-CM | POA: Diagnosis present

## 2022-12-29 DIAGNOSIS — I959 Hypotension, unspecified: Principal | ICD-10-CM

## 2022-12-29 DIAGNOSIS — E872 Acidosis, unspecified: Secondary | ICD-10-CM | POA: Diagnosis not present

## 2022-12-29 DIAGNOSIS — F419 Anxiety disorder, unspecified: Secondary | ICD-10-CM | POA: Diagnosis not present

## 2022-12-29 DIAGNOSIS — Z888 Allergy status to other drugs, medicaments and biological substances status: Secondary | ICD-10-CM

## 2022-12-29 DIAGNOSIS — Z881 Allergy status to other antibiotic agents status: Secondary | ICD-10-CM

## 2022-12-29 DIAGNOSIS — G894 Chronic pain syndrome: Secondary | ICD-10-CM | POA: Diagnosis present

## 2022-12-29 DIAGNOSIS — L89322 Pressure ulcer of left buttock, stage 2: Secondary | ICD-10-CM | POA: Diagnosis present

## 2022-12-29 DIAGNOSIS — G5632 Lesion of radial nerve, left upper limb: Secondary | ICD-10-CM | POA: Diagnosis not present

## 2022-12-29 DIAGNOSIS — N390 Urinary tract infection, site not specified: Secondary | ICD-10-CM | POA: Diagnosis not present

## 2022-12-29 DIAGNOSIS — G904 Autonomic dysreflexia: Secondary | ICD-10-CM | POA: Diagnosis present

## 2022-12-29 DIAGNOSIS — G8222 Paraplegia, incomplete: Secondary | ICD-10-CM | POA: Diagnosis present

## 2022-12-29 DIAGNOSIS — Z87440 Personal history of urinary (tract) infections: Secondary | ICD-10-CM

## 2022-12-29 DIAGNOSIS — Z87891 Personal history of nicotine dependence: Secondary | ICD-10-CM

## 2022-12-29 DIAGNOSIS — R918 Other nonspecific abnormal finding of lung field: Secondary | ICD-10-CM | POA: Diagnosis not present

## 2022-12-29 DIAGNOSIS — N319 Neuromuscular dysfunction of bladder, unspecified: Secondary | ICD-10-CM

## 2022-12-29 DIAGNOSIS — R7401 Elevation of levels of liver transaminase levels: Secondary | ICD-10-CM | POA: Diagnosis present

## 2022-12-29 DIAGNOSIS — E162 Hypoglycemia, unspecified: Secondary | ICD-10-CM | POA: Diagnosis not present

## 2022-12-29 DIAGNOSIS — Z885 Allergy status to narcotic agent status: Secondary | ICD-10-CM

## 2022-12-29 DIAGNOSIS — R112 Nausea with vomiting, unspecified: Secondary | ICD-10-CM | POA: Diagnosis not present

## 2022-12-29 DIAGNOSIS — Z743 Need for continuous supervision: Secondary | ICD-10-CM | POA: Diagnosis not present

## 2022-12-29 DIAGNOSIS — E039 Hypothyroidism, unspecified: Secondary | ICD-10-CM | POA: Diagnosis not present

## 2022-12-29 DIAGNOSIS — K592 Neurogenic bowel, not elsewhere classified: Secondary | ICD-10-CM | POA: Diagnosis not present

## 2022-12-29 DIAGNOSIS — J811 Chronic pulmonary edema: Secondary | ICD-10-CM | POA: Diagnosis not present

## 2022-12-29 DIAGNOSIS — G901 Familial dysautonomia [Riley-Day]: Secondary | ICD-10-CM | POA: Diagnosis not present

## 2022-12-29 DIAGNOSIS — G8220 Paraplegia, unspecified: Secondary | ICD-10-CM | POA: Diagnosis not present

## 2022-12-29 DIAGNOSIS — K5909 Other constipation: Secondary | ICD-10-CM | POA: Diagnosis present

## 2022-12-29 DIAGNOSIS — Z79890 Hormone replacement therapy (postmenopausal): Secondary | ICD-10-CM

## 2022-12-29 DIAGNOSIS — Z79899 Other long term (current) drug therapy: Secondary | ICD-10-CM

## 2022-12-29 DIAGNOSIS — K760 Fatty (change of) liver, not elsewhere classified: Secondary | ICD-10-CM | POA: Diagnosis not present

## 2022-12-29 HISTORY — DX: Sepsis, unspecified organism: A41.9

## 2022-12-29 LAB — CBC WITH DIFFERENTIAL/PLATELET
Abs Immature Granulocytes: 0.01 10*3/uL (ref 0.00–0.07)
Basophils Absolute: 0 10*3/uL (ref 0.0–0.1)
Basophils Relative: 0 %
Eosinophils Absolute: 0 10*3/uL (ref 0.0–0.5)
Eosinophils Relative: 0 %
HCT: 41.2 % (ref 39.0–52.0)
Hemoglobin: 13.1 g/dL (ref 13.0–17.0)
Immature Granulocytes: 2 %
Lymphocytes Relative: 19 %
Lymphs Abs: 0.1 10*3/uL — ABNORMAL LOW (ref 0.7–4.0)
MCH: 27.5 pg (ref 26.0–34.0)
MCHC: 31.8 g/dL (ref 30.0–36.0)
MCV: 86.4 fL (ref 80.0–100.0)
Monocytes Absolute: 0 10*3/uL — ABNORMAL LOW (ref 0.1–1.0)
Monocytes Relative: 0 %
Neutro Abs: 0.5 10*3/uL — ABNORMAL LOW (ref 1.7–7.7)
Neutrophils Relative %: 79 %
Platelets: 121 10*3/uL — ABNORMAL LOW (ref 150–400)
RBC: 4.77 MIL/uL (ref 4.22–5.81)
RDW: 13.8 % (ref 11.5–15.5)
WBC: 0.6 10*3/uL — CL (ref 4.0–10.5)
nRBC: 0 % (ref 0.0–0.2)

## 2022-12-29 LAB — TSH: TSH: 1.515 u[IU]/mL (ref 0.350–4.500)

## 2022-12-29 LAB — CK: Total CK: 94 U/L (ref 49–397)

## 2022-12-29 LAB — PHOSPHORUS: Phosphorus: <1 mg/dL — CL (ref 2.5–4.6)

## 2022-12-29 LAB — BASIC METABOLIC PANEL
Anion gap: 13 (ref 5–15)
BUN: 22 mg/dL — ABNORMAL HIGH (ref 6–20)
CO2: 22 mmol/L (ref 22–32)
Calcium: 8.8 mg/dL — ABNORMAL LOW (ref 8.9–10.3)
Chloride: 103 mmol/L (ref 98–111)
Creatinine, Ser: 1.33 mg/dL — ABNORMAL HIGH (ref 0.61–1.24)
GFR, Estimated: >60 mL/min (ref >60)
Glucose, Bld: 103 mg/dL — ABNORMAL HIGH (ref 70–99)
Potassium: 3.2 mmol/L — ABNORMAL LOW (ref 3.5–5.1)
Sodium: 138 mmol/L (ref 135–145)

## 2022-12-29 LAB — URINALYSIS, W/ REFLEX TO CULTURE (INFECTION SUSPECTED)
Bilirubin Urine: NEGATIVE
Glucose, UA: 50 mg/dL — AB
Ketones, ur: 5 mg/dL — AB
Nitrite: NEGATIVE
Protein, ur: >=300 mg/dL — AB
Specific Gravity, Urine: 1.023 (ref 1.005–1.030)
WBC, UA: >50 WBC/hpf (ref 0–5)
pH: 5 (ref 5.0–8.0)

## 2022-12-29 LAB — URIC ACID: Uric Acid, Serum: 6 mg/dL (ref 3.7–8.6)

## 2022-12-29 LAB — PROCALCITONIN: Procalcitonin: 11.68 ng/mL

## 2022-12-29 LAB — LACTIC ACID, PLASMA: Lactic Acid, Venous: 7.2 mmol/L (ref 0.5–1.9)

## 2022-12-29 LAB — MAGNESIUM: Magnesium: 1.4 mg/dL — ABNORMAL LOW (ref 1.7–2.4)

## 2022-12-29 LAB — T4, FREE: Free T4: 1.23 ng/dL — ABNORMAL HIGH (ref 0.61–1.12)

## 2022-12-29 MED ORDER — HYDROMORPHONE HCL 1 MG/ML IJ SOLN
1.0000 mg | Freq: Once | INTRAMUSCULAR | Status: AC
  Administered 2022-12-29: 1 mg via INTRAVENOUS
  Filled 2022-12-29: qty 1

## 2022-12-29 MED ORDER — LACTATED RINGERS IV BOLUS: 1000.0000 mL | Freq: Once | INTRAVENOUS | Status: DC

## 2022-12-29 MED ORDER — POLYETHYLENE GLYCOL 3350 17 G PO PACK: 17.0000 g | PACK | Freq: Every day | ORAL | Status: DC | PRN

## 2022-12-29 MED ORDER — INSULIN ASPART 100 UNIT/ML IJ SOLN: 0.0000 [IU] | INTRAMUSCULAR | Status: DC

## 2022-12-29 MED ORDER — FLAVOXATE HCL 100 MG PO TABS: 100.0000 mg | ORAL_TABLET | Freq: Three times a day (TID) | ORAL | Status: DC | PRN

## 2022-12-29 MED ORDER — MAGNESIUM SULFATE 2 GM/50ML IV SOLN
2.0000 g | Freq: Once | INTRAVENOUS | Status: AC
  Administered 2022-12-29: 2 g via INTRAVENOUS
  Filled 2022-12-29: qty 50

## 2022-12-29 MED ORDER — SODIUM CHLORIDE 0.9 % IV BOLUS
1000.0000 mL | Freq: Once | INTRAVENOUS | Status: AC
  Administered 2022-12-29: 1000 mL via INTRAVENOUS

## 2022-12-29 MED ORDER — IOHEXOL 300 MG/ML  SOLN
100.0000 mL | Freq: Once | INTRAMUSCULAR | Status: AC | PRN
  Administered 2022-12-31: 100 mL via INTRAVENOUS

## 2022-12-29 MED ORDER — SODIUM CHLORIDE 0.9 % IV SOLN
2.0000 g | Freq: Once | INTRAVENOUS | Status: AC
  Administered 2022-12-29: 2 g via INTRAVENOUS
  Filled 2022-12-29: qty 12.5

## 2022-12-29 MED ORDER — BACLOFEN 10 MG PO TABS
20.0000 mg | ORAL_TABLET | Freq: Four times a day (QID) | ORAL | Status: DC
  Administered 2022-12-29: 20 mg via ORAL
  Filled 2022-12-29 (×2): qty 2

## 2022-12-29 MED ORDER — SODIUM CHLORIDE 0.9 % IV SOLN
250.0000 mL | INTRAVENOUS | Status: DC
  Administered 2023-01-05: 250 mL via INTRAVENOUS

## 2022-12-29 MED ORDER — GABAPENTIN 400 MG PO CAPS
800.0000 mg | ORAL_CAPSULE | Freq: Four times a day (QID) | ORAL | Status: DC
  Administered 2022-12-30 (×2): 800 mg via ORAL
  Filled 2022-12-29 (×2): qty 2
  Filled 2022-12-29: qty 8
  Filled 2022-12-29: qty 2

## 2022-12-29 MED ORDER — NOREPINEPHRINE 4 MG/250ML-% IV SOLN
2.0000 ug/min | INTRAVENOUS | Status: DC
  Administered 2022-12-29 – 2022-12-30 (×2): 2 ug/min via INTRAVENOUS
  Filled 2022-12-29: qty 250

## 2022-12-29 MED ORDER — HEPARIN SODIUM (PORCINE) 5000 UNIT/ML IJ SOLN
5000.0000 [IU] | Freq: Three times a day (TID) | INTRAMUSCULAR | Status: DC
  Administered 2022-12-30 – 2023-01-01 (×8): 5000 [IU] via SUBCUTANEOUS
  Filled 2022-12-29 (×8): qty 1

## 2022-12-29 MED ORDER — TIZANIDINE HCL 2 MG PO TABS
4.0000 mg | ORAL_TABLET | Freq: Four times a day (QID) | ORAL | Status: DC
  Administered 2022-12-29: 4 mg via ORAL
  Filled 2022-12-29 (×2): qty 2

## 2022-12-29 MED ORDER — ALPRAZOLAM 0.5 MG PO TABS
2.0000 mg | ORAL_TABLET | Freq: Once | ORAL | Status: AC
  Administered 2022-12-29: 2 mg via ORAL
  Filled 2022-12-29: qty 4

## 2022-12-29 MED ORDER — DOCUSATE SODIUM 100 MG PO CAPS
100.0000 mg | ORAL_CAPSULE | Freq: Two times a day (BID) | ORAL | Status: DC | PRN
  Administered 2023-01-02: 100 mg via ORAL
  Filled 2022-12-29: qty 1

## 2022-12-29 MED ORDER — VANCOMYCIN HCL 2000 MG/400ML IV SOLN
2000.0000 mg | Freq: Once | INTRAVENOUS | Status: AC
  Administered 2022-12-29: 2000 mg via INTRAVENOUS
  Filled 2022-12-29: qty 400

## 2022-12-29 MED ORDER — POTASSIUM PHOSPHATES 15 MMOLE/5ML IV SOLN
30.0000 mmol | Freq: Once | INTRAVENOUS | Status: AC
  Administered 2022-12-30: 30 mmol via INTRAVENOUS
  Filled 2022-12-29: qty 10

## 2022-12-29 MED ORDER — METRONIDAZOLE 500 MG/100ML IV SOLN
500.0000 mg | Freq: Once | INTRAVENOUS | Status: AC
  Administered 2022-12-30: 500 mg via INTRAVENOUS
  Filled 2022-12-29: qty 100

## 2022-12-29 MED ORDER — POTASSIUM CHLORIDE 10 MEQ/100ML IV SOLN: 10.0000 meq | INTRAVENOUS | Status: DC

## 2022-12-29 NOTE — ED Notes (Signed)
The pt has been moving all over the bed, causing multiple Ivs to infiltrate or come out.  ICU was consulted and asked to come down to do a central line, but the provider decided to not do it.  Now, pt has only one peripheral line placed. He has repeatedly asked for food, po fluids, and other things. His wife is at bedside, pt has been changed and cleaned.  Pt is extremely difficult to explain

## 2022-12-29 NOTE — ED Notes (Signed)
IV infultrated, had to get another line in pt. Pt very demanding

## 2022-12-29 NOTE — ED Triage Notes (Signed)
Pt arrives via EMS from home w/c/o leg pain. Pt states that he has a autonomic condition and he has a UTI. Pt further states that last time anesthesia had to come do an IV .

## 2022-12-29 NOTE — ED Provider Notes (Signed)
[facility] Provider Note    Event Date/Time   First MD Initiated Contact with Patient 12/29/22 1925     (approximate)   History   Leg Pain   HPI  Justin [name] is a 42 y.o. male   Past medical history of paraplegia from accident, neurogenic bladder, dysautonomia, frequent UTIs, frequent leg spasms, depression, anxiety, chronic pain syndrome, asthma who presents to the emergency department with painful leg spasms and urinary frequency and discomfort in the suprapubic area consistent with urinary tract infections in the past.  Symptoms been ongoing and worsening over the last approximately 1 week.  He denies any other acute medical complaints.   External Medical Documents Reviewed: Discharge summary from 10/05/2022 when he presented with muscle spasms in his lower extremities as well as UTI      Physical Exam   Triage Vital Signs: ED Triage Vitals  Enc Vitals Group     BP      Pulse      Resp      Temp      Temp src      SpO2      Weight      Height      Head Circumference      Peak Flow      Pain Score      Pain Loc      Pain Edu?      Excl. in GC?     Most recent vital signs: Vitals:   12/29/22 2330 12/29/22 2336  BP:    Pulse: (!) 117   Resp: (!) 22   Temp:  98.1 F (36.7 C)  SpO2: 91%     General: Awake, with occasional jolts of pain that causes him to yell out in pain.  In between these episodes he is awake alert pleasant conversant. CV:  Good peripheral perfusion.  Resp:  Normal effort.  Abd:  No distention.  Nontender abdomen.   ED Results / Procedures / Treatments   Labs (all labs ordered are listed, but only abnormal results are displayed) Labs Reviewed  BASIC METABOLIC PANEL - Abnormal; Notable for the following components:      Result Value   Potassium 3.2 (*)    Glucose, Bld 103 (*)    BUN 22 (*)    Creatinine, Ser 1.33 (*)    Calcium 8.8 (*)    All other components within normal limits   LACTIC ACID, PLASMA - Abnormal; Notable for the following components:   Lactic Acid, Venous 7.2 (*)    All other components within normal limits  CBC WITH DIFFERENTIAL/PLATELET - Abnormal; Notable for the following components:   WBC 0.6 (*)    Platelets 121 (*)    Neutro Abs 0.5 (*)    Lymphs Abs 0.1 (*)    Monocytes Absolute 0.0 (*)    All other components within normal limits  URINALYSIS, W/ REFLEX TO CULTURE (INFECTION SUSPECTED) - Abnormal; Notable for the following components:   Color, Urine AMBER (*)    APPearance TURBID (*)    Glucose, UA 50 (*)    Hgb urine dipstick SMALL (*)    Ketones, ur 5 (*)    Protein, ur >=300 (*)    Leukocytes,Ua MODERATE (*)    Bacteria, UA RARE (*)    All other components within normal limits  MAGNESIUM - Abnormal; Notable for the following components:   Magnesium 1.4 (*)    All other components within normal limits  T4, FREE - Abnormal; Notable for the following components:   Free T4 1.23 (*)    All other components within normal limits  PHOSPHORUS - Abnormal; Notable for the following components:   Phosphorus <1.0 (*)    All other components within normal limits  CULTURE, BLOOD (ROUTINE X 2)  CULTURE, BLOOD (ROUTINE X 2)  URINE CULTURE  TSH  CK  LACTIC ACID, PLASMA  PATHOLOGIST SMEAR REVIEW  PROCALCITONIN  BASIC METABOLIC PANEL  CBC  HEMOGLOBIN A1C  URINE DRUG SCREEN, QUALITATIVE ([facility] ONLY)  URIC ACID     I ordered and reviewed the above labs they are notable for lactic acidosis to 7, severe electrolyte derangements phosphorus less than 1, hypomagnesemia, hypokalemia  EKG  ED ECG REPORT I, Pilar Jarvis, the attending physician, personally viewed and interpreted this ECG.   Date: 12/29/2022  EKG Time: 2223  Rate: 125  Rhythm: sinus tachycardia  Axis: nl  Intervals:none  ST&T Change: No acute ischemic changes    PROCEDURES:  Critical Care performed: Yes, see critical care procedure note(s)  .Critical  Care  Performed by: Pilar Jarvis, MD Authorized by: Pilar Jarvis, MD   Critical care provider statement:    Critical care time (minutes):  30   Critical care was time spent personally by me on the following activities:  Development of treatment plan with patient or surrogate, discussions with consultants, evaluation of patient's response to treatment, examination of patient, ordering and review of laboratory studies, ordering and review of radiographic studies, ordering and performing treatments and interventions, pulse oximetry, re-evaluation of patient's condition and review of old charts    MEDICATIONS ORDERED IN ED: Medications  flavoxATE (URISPAS) tablet 100 mg (has no administration in time range)  gabapentin (NEURONTIN) capsule 800 mg (0 mg Oral Hold 12/29/22 2225)  metroNIDAZOLE (FLAGYL) IVPB 500 mg (has no administration in time range)  magnesium sulfate IVPB 2 g 50 mL (2 g Intravenous New Bag/Given 12/29/22 2240)  potassium PHOSPHATE 30 mmol in dextrose 5 % 500 mL infusion (has no administration in time range)  0.9 %  sodium chloride infusion (has no administration in time range)  norepinephrine (LEVOPHED)  in (0.016 mg/mL) premix infusion (4 mcg/min Intravenous Rate/Dose Change 12/29/22 2317)  docusate sodium (COLACE) capsule 100 mg (has no administration in time range)  polyethylene glycol (MIRALAX / GLYCOLAX) packet 17 g (has no administration in time range)  heparin injection 5,000 Units (has no administration in time range)  insulin aspart (novoLOG) injection 0-15 Units (has no administration in time range)  iohexol (OMNIPAQUE) 300 MG/ML solution 100 mL (has no administration in time range)  lactated ringers bolus 1,000 mL (has no administration in time range)  HYDROmorphone (DILAUDID) injection 1 mg (1 mg Intravenous Given 12/29/22 1946)  sodium chloride 0.9 % bolus 1,000 mL (0 mLs Intravenous Stopped 12/29/22 2306)  ALPRAZolam (XANAX) tablet 2 mg (2 mg Oral Given 12/29/22  1942)  ceFEPIme (MAXIPIME) 2 g in sodium chloride 0.9 % 100 mL IVPB (0 g Intravenous Stopped 12/29/22 2253)  vancomycin (VANCOREADY) IVPB 2000 mg/400 mL (0 mg Intravenous Stopped 12/29/22 2252)  sodium chloride 0.9 % bolus 1,000 mL (0 mLs Intravenous Stopped 12/29/22 2318)    External physician / consultants:  I spoke with hospitalist for admission and regarding care plan for this patient.   IMPRESSION / MDM / ASSESSMENT AND PLAN / ED COURSE  I reviewed the triage vital signs and the nursing notes.  Patient's presentation is most consistent with acute presentation with potential threat to life or bodily function.  Differential diagnosis includes, but is not limited to, urinary tract infection, sepsis, rhabdomyolysis, muscle spasms   The patient is on the cardiac monitor to evaluate for evidence of arrhythmia and/or significant heart rate changes.  MDM: This is a patient with recurrent UTIs neurogenic bladder and muscle spasms which are usually provoked by urinary tract infection per patient report.  He also has some symptoms consistent with urinary tract infection with discomfort with urination, dysuria.  Will obtain a UA, basic labs, check electrolytes, check thyroid function, give fluids, and Dilaudid for pain control as well as antispasmodics that the patient is already on.  ---  The patient had markedly abnormal lab testing including a phosphorus under 1, hypomagnesemia, hypokalemia and has autonomic instability with now hypotension, tachycardia but fortunately remains with normal mental status awake alert comfortable appearing with intermittent spasms improved with some of the medications initially ordered in ED.  He also has a markedly decreased white blood cell count of 0.6, and a lactic acidosis greater than 7, concern for sepsis so add on blood cultures, give another liter of crystalloid, and initiate broad-spectrum antibiotics.  I ordered a CT scan  of the chest abdomen pelvis to assess for potential source of infection  I consulted with ICU who placed admission.        FINAL CLINICAL IMPRESSION(S) / ED DIAGNOSES   Final diagnoses:  Hypotension, unspecified hypotension type  Hypomagnesemia  Hypophosphatemia  Hypokalemia  Lactic acidosis  Neutropenia, unspecified type  Sepsis, due to unspecified organism, unspecified whether acute organ dysfunction present     Rx / DC Orders   ED Discharge Orders     None        Note:  This document was prepared using Dragon voice recognition software and may include unintentional dictation errors.    Pilar Jarvis, MD 12/29/22 [phone]

## 2022-12-29 NOTE — Progress Notes (Signed)
PHARMACY -  BRIEF ANTIBIOTIC NOTE   Pharmacy has received consult(s) for Vancomcyin from an ED provider.  The patient's profile has been reviewed for ht/wt/allergies/indication/available labs.    One time order(s) placed for Vancomycin 2 gm IV X 1  Further antibiotics/pharmacy consults should be ordered by admitting physician if indicated.                       Thank you, Senaida Chilcote D 12/29/2022  10:02 PM

## 2022-12-29 NOTE — Progress Notes (Addendum)
PHARMACY CONSULT NOTE - FOLLOW UP  Pharmacy Consult for Electrolyte Monitoring and Replacement   Recent Labs: Potassium (mmol/L)  Date Value  12/29/2022 3.2 (L)  06/28/2014 3.6   Magnesium (mg/dL)  Date Value  58/30/9407 1.4 (L)   Calcium (mg/dL)  Date Value  68/04/8109 8.8 (L)   Calcium, Total (mg/dL)  Date Value  31/59/4585 8.3 (L)   Albumin (g/dL)  Date Value  92/92/4462 4.1  04/20/2022 4.6  06/25/2014 4.0   Phosphorus (mg/dL)  Date Value  86/38/1771 <1.0 (LL)   Sodium (mmol/L)  Date Value  12/29/2022 138  04/20/2022 139  06/28/2014 139     Assessment: 4/13 @ 2006:  K = 3.2,   Phos = < 1.0 ,  Mag = 1.4  Goal of Therapy:  Electrolytes WNL   Plan:  Mag Sulfate 2 gm IV X 1 given in ED on 4/13 @ 2200.  K Phos 30 mmol IV X 1 ordered for 4/13 @ ~ 2300.  ( 30 mmol KPhos = ~ 44 mEq of IV K, will hold additional KCl for now)   - Recheck electrolytes on 4/14 with AM labs.    Justin [name] ,PharmD Clinical Pharmacist 12/29/2022 10:56 PM

## 2022-12-30 ENCOUNTER — Inpatient Hospital Stay: Payer: Medicare HMO

## 2022-12-30 DIAGNOSIS — N390 Urinary tract infection, site not specified: Secondary | ICD-10-CM | POA: Diagnosis not present

## 2022-12-30 DIAGNOSIS — A419 Sepsis, unspecified organism: Secondary | ICD-10-CM | POA: Diagnosis not present

## 2022-12-30 LAB — CBC
HCT: 39.7 % (ref 39.0–52.0)
Hemoglobin: 12.7 g/dL — ABNORMAL LOW (ref 13.0–17.0)
MCH: 27.7 pg (ref 26.0–34.0)
MCHC: 32 g/dL (ref 30.0–36.0)
MCV: 86.5 fL (ref 80.0–100.0)
Platelets: 99 K/uL — ABNORMAL LOW (ref 150–400)
RBC: 4.59 MIL/uL (ref 4.22–5.81)
RDW: 14.1 % (ref 11.5–15.5)
WBC: 4 K/uL (ref 4.0–10.5)
nRBC: 0 % (ref 0.0–0.2)

## 2022-12-30 LAB — BASIC METABOLIC PANEL
Anion gap: 10 (ref 5–15)
Anion gap: 5 (ref 5–15)
BUN: 25 mg/dL — ABNORMAL HIGH (ref 6–20)
BUN: 29 mg/dL — ABNORMAL HIGH (ref 6–20)
CO2: 22 mmol/L (ref 22–32)
CO2: 25 mmol/L (ref 22–32)
Calcium: 8.5 mg/dL — ABNORMAL LOW (ref 8.9–10.3)
Calcium: 7.8 mg/dL — ABNORMAL LOW (ref 8.9–10.3)
Chloride: 106 mmol/L (ref 98–111)
Chloride: 109 mmol/L (ref 98–111)
Creatinine, Ser: 1.61 mg/dL — ABNORMAL HIGH (ref 0.61–1.24)
Creatinine, Ser: 1.2 mg/dL (ref 0.61–1.24)
GFR, Estimated: 54 mL/min — ABNORMAL LOW (ref >60)
GFR, Estimated: >60 mL/min (ref >60)
Glucose, Bld: 91 mg/dL (ref 70–99)
Glucose, Bld: 88 mg/dL (ref 70–99)
Potassium: 3.7 mmol/L (ref 3.5–5.1)
Potassium: 4 mmol/L (ref 3.5–5.1)
Sodium: 138 mmol/L (ref 135–145)
Sodium: 139 mmol/L (ref 135–145)

## 2022-12-30 LAB — URINE DRUG SCREEN, QUALITATIVE (ARMC ONLY)
Amphetamines, Ur Screen: NOT DETECTED
Barbiturates, Ur Screen: NOT DETECTED
Benzodiazepine, Ur Scrn: POSITIVE — AB
Cannabinoid 50 Ng, Ur ~~LOC~~: NOT DETECTED
Cocaine Metabolite,Ur ~~LOC~~: NOT DETECTED
MDMA (Ecstasy)Ur Screen: NOT DETECTED
Methadone Scn, Ur: POSITIVE — AB
Opiate, Ur Screen: POSITIVE — AB
Phencyclidine (PCP) Ur S: NOT DETECTED
Tricyclic, Ur Screen: NOT DETECTED

## 2022-12-30 LAB — PHOSPHORUS
Phosphorus: 1.6 mg/dL — ABNORMAL LOW (ref 2.5–4.6)
Phosphorus: 3.7 mg/dL (ref 2.5–4.6)

## 2022-12-30 LAB — MAGNESIUM: Magnesium: 2 mg/dL (ref 1.7–2.4)

## 2022-12-30 LAB — HEPATIC FUNCTION PANEL
ALT: 430 U/L — ABNORMAL HIGH (ref 0–44)
AST: 390 U/L — ABNORMAL HIGH (ref 15–41)
Albumin: 3.4 g/dL — ABNORMAL LOW (ref 3.5–5.0)
Alkaline Phosphatase: 84 U/L (ref 38–126)
Bilirubin, Direct: 1.3 mg/dL — ABNORMAL HIGH (ref 0.0–0.2)
Indirect Bilirubin: 1 mg/dL — ABNORMAL HIGH (ref 0.3–0.9)
Total Bilirubin: 2.3 mg/dL — ABNORMAL HIGH (ref 0.3–1.2)
Total Protein: 5.8 g/dL — ABNORMAL LOW (ref 6.5–8.1)

## 2022-12-30 LAB — GLUCOSE, CAPILLARY
Glucose-Capillary: 101 mg/dL — ABNORMAL HIGH (ref 70–99)
Glucose-Capillary: 104 mg/dL — ABNORMAL HIGH (ref 70–99)
Glucose-Capillary: 55 mg/dL — ABNORMAL LOW (ref 70–99)
Glucose-Capillary: 73 mg/dL (ref 70–99)
Glucose-Capillary: 79 mg/dL (ref 70–99)
Glucose-Capillary: 81 mg/dL (ref 70–99)
Glucose-Capillary: 82 mg/dL (ref 70–99)
Glucose-Capillary: 85 mg/dL (ref 70–99)
Glucose-Capillary: 88 mg/dL (ref 70–99)

## 2022-12-30 LAB — LACTIC ACID, PLASMA
Lactic Acid, Venous: 5.1 mmol/L (ref 0.5–1.9)
Lactic Acid, Venous: 2 mmol/L (ref 0.5–1.9)

## 2022-12-30 LAB — PATHOLOGIST SMEAR REVIEW

## 2022-12-30 LAB — HEMOGLOBIN A1C
Hgb A1c MFr Bld: 5.1 % (ref 4.8–5.6)
Mean Plasma Glucose: 99.67 mg/dL

## 2022-12-30 LAB — CULTURE, BLOOD (ROUTINE X 2): Culture: NO GROWTH

## 2022-12-30 MED ORDER — ALPRAZOLAM 0.5 MG PO TABS
1.0000 mg | ORAL_TABLET | Freq: Once | ORAL | Status: AC
  Administered 2022-12-30: 1 mg via ORAL
  Filled 2022-12-30: qty 2

## 2022-12-30 MED ORDER — METHADONE HCL 10 MG PO TABS
20.0000 mg | ORAL_TABLET | Freq: Two times a day (BID) | ORAL | Status: DC
  Administered 2022-12-30: 20 mg via ORAL
  Filled 2022-12-30: qty 2

## 2022-12-30 MED ORDER — DEXMEDETOMIDINE HCL IN NACL 400 MCG/100ML IV SOLN
INTRAVENOUS | Status: AC
  Administered 2022-12-30: 0.4 ug/kg/h via INTRAVENOUS
  Filled 2022-12-30: qty 100

## 2022-12-30 MED ORDER — DEXTROSE 50 % IV SOLN
1.0000 | Freq: Once | INTRAVENOUS | Status: AC
  Administered 2022-12-30: 50 mL via INTRAVENOUS

## 2022-12-30 MED ORDER — MIDAZOLAM HCL 2 MG/2ML IJ SOLN: 2.0000 mg | Freq: Once | INTRAMUSCULAR | Status: AC

## 2022-12-30 MED ORDER — LACTATED RINGERS IV BOLUS
1000.0000 mL | Freq: Once | INTRAVENOUS | Status: AC
  Administered 2022-12-30: 1000 mL via INTRAVENOUS

## 2022-12-30 MED ORDER — HYDROMORPHONE HCL 1 MG/ML IJ SOLN: 1.0000 mg | Freq: Once | INTRAMUSCULAR | Status: AC

## 2022-12-30 MED ORDER — DIPHENHYDRAMINE HCL 50 MG/ML IJ SOLN: 12.5000 mg | Freq: Four times a day (QID) | INTRAMUSCULAR | Status: DC | PRN

## 2022-12-30 MED ORDER — DEXMEDETOMIDINE HCL IN NACL 400 MCG/100ML IV SOLN: 0.0000 ug/kg/h | INTRAVENOUS | Status: DC

## 2022-12-30 MED ORDER — ONDANSETRON HCL 4 MG/2ML IJ SOLN: 4.0000 mg | Freq: Four times a day (QID) | INTRAMUSCULAR | Status: DC | PRN

## 2022-12-30 MED ORDER — GABAPENTIN 400 MG PO CAPS
400.0000 mg | ORAL_CAPSULE | Freq: Four times a day (QID) | ORAL | Status: DC | PRN
  Administered 2022-12-31 – 2023-01-01 (×2): 400 mg via ORAL
  Filled 2022-12-30 (×3): qty 1

## 2022-12-30 MED ORDER — SODIUM CHLORIDE 0.9% FLUSH: 9.0000 mL | INTRAVENOUS | Status: DC | PRN

## 2022-12-30 MED ORDER — METHADONE HCL 10 MG PO TABS
20.0000 mg | ORAL_TABLET | Freq: Four times a day (QID) | ORAL | Status: DC
  Administered 2022-12-30 – 2023-01-07 (×31): 20 mg via ORAL
  Filled 2022-12-30 (×32): qty 2

## 2022-12-30 MED ORDER — POTASSIUM PHOSPHATES 15 MMOLE/5ML IV SOLN
30.0000 mmol | Freq: Once | INTRAVENOUS | Status: AC
  Administered 2022-12-30: 30 mmol via INTRAVENOUS
  Filled 2022-12-30: qty 10

## 2022-12-30 MED ORDER — LACTATED RINGERS IV BOLUS
500.0000 mL | Freq: Once | INTRAVENOUS | Status: AC
  Administered 2022-12-30: 500 mL via INTRAVENOUS

## 2022-12-30 MED ORDER — QUETIAPINE FUMARATE 25 MG PO TABS: 50.0000 mg | ORAL_TABLET | Freq: Every day | ORAL | Status: DC

## 2022-12-30 MED ORDER — HYDROMORPHONE HCL 1 MG/ML IJ SOLN
0.5000 mg | INTRAMUSCULAR | Status: DC | PRN
  Administered 2022-12-31 (×2): 0.5 mg via INTRAVENOUS
  Filled 2022-12-30 (×2): qty 1

## 2022-12-30 MED ORDER — MIDAZOLAM HCL 2 MG/2ML IJ SOLN
INTRAMUSCULAR | Status: AC
  Administered 2022-12-30: 2 mg via INTRAVENOUS
  Filled 2022-12-30: qty 2

## 2022-12-30 MED ORDER — OXYCODONE HCL 5 MG PO TABS
15.0000 mg | ORAL_TABLET | Freq: Four times a day (QID) | ORAL | Status: DC | PRN
  Administered 2022-12-30: 15 mg via ORAL
  Filled 2022-12-30: qty 3

## 2022-12-30 MED ORDER — NOREPINEPHRINE 16 MG/250ML-% IV SOLN
0.0000 ug/min | INTRAVENOUS | Status: DC
  Administered 2022-12-30: 15 ug/min via INTRAVENOUS
  Administered 2022-12-31: 22 ug/min via INTRAVENOUS
  Filled 2022-12-30 (×2): qty 250

## 2022-12-30 MED ORDER — VANCOMYCIN HCL IN DEXTROSE 1-5 GM/200ML-% IV SOLN
1000.0000 mg | Freq: Two times a day (BID) | INTRAVENOUS | Status: DC
  Filled 2022-12-30: qty 200

## 2022-12-30 MED ORDER — NALOXONE HCL 0.4 MG/ML IJ SOLN: 0.4000 mg | INTRAMUSCULAR | Status: DC | PRN

## 2022-12-30 MED ORDER — DEXMEDETOMIDINE HCL IN NACL 400 MCG/100ML IV SOLN
0.0000 ug/kg/h | INTRAVENOUS | Status: DC
  Administered 2022-12-30: 0.4 ug/kg/h via INTRAVENOUS

## 2022-12-30 MED ORDER — HYDROMORPHONE 1 MG/ML IV SOLN
INTRAVENOUS | Status: DC
  Filled 2022-12-30 (×4): qty 30

## 2022-12-30 MED ORDER — DEXTROSE 50 % IV SOLN
INTRAVENOUS | Status: AC
  Filled 2022-12-30: qty 50

## 2022-12-30 MED ORDER — HYDROMORPHONE HCL 1 MG/ML IJ SOLN
INTRAMUSCULAR | Status: AC
  Administered 2022-12-30: 1 mg via INTRAVENOUS
  Filled 2022-12-30: qty 1

## 2022-12-30 MED ORDER — HYDROMORPHONE HCL 1 MG/ML IJ SOLN: 0.5000 mg | INTRAMUSCULAR | Status: DC | PRN

## 2022-12-30 MED ORDER — MIDAZOLAM HCL 2 MG/2ML IJ SOLN
INTRAMUSCULAR | Status: AC
  Administered 2022-12-30: 2 mg
  Filled 2022-12-30: qty 2

## 2022-12-30 MED ORDER — QUETIAPINE FUMARATE 25 MG PO TABS: 25.0000 mg | ORAL_TABLET | Freq: Two times a day (BID) | ORAL | Status: DC

## 2022-12-30 MED ORDER — NAPROXEN 250 MG PO TABS
500.0000 mg | ORAL_TABLET | Freq: Two times a day (BID) | ORAL | Status: DC
  Administered 2022-12-30 – 2022-12-31 (×3): 500 mg via ORAL
  Filled 2022-12-30 (×3): qty 2

## 2022-12-30 MED ORDER — DEXTROSE 50 % IV SOLN
12.5000 g | INTRAVENOUS | Status: AC
  Administered 2022-12-30: 12.5 g via INTRAVENOUS
  Filled 2022-12-30: qty 50

## 2022-12-30 MED ORDER — DIPHENHYDRAMINE HCL 12.5 MG/5ML PO ELIX: 12.5000 mg | ORAL_SOLUTION | Freq: Four times a day (QID) | ORAL | Status: DC | PRN

## 2022-12-30 MED ORDER — SODIUM CHLORIDE 0.9 % IV SOLN
2.0000 g | Freq: Three times a day (TID) | INTRAVENOUS | Status: DC
  Administered 2022-12-30 – 2023-01-02 (×9): 2 g via INTRAVENOUS
  Filled 2022-12-30 (×3): qty 2
  Filled 2022-12-30: qty 12.5
  Filled 2022-12-30: qty 2
  Filled 2022-12-30 (×4): qty 12.5
  Filled 2022-12-30: qty 2
  Filled 2022-12-30: qty 12.5

## 2022-12-30 MED ORDER — TIZANIDINE HCL 4 MG PO TABS
8.0000 mg | ORAL_TABLET | Freq: Four times a day (QID) | ORAL | Status: DC
  Administered 2022-12-30 – 2023-01-05 (×22): 8 mg via ORAL
  Filled 2022-12-30 (×27): qty 2

## 2022-12-30 MED ORDER — TIZANIDINE HCL 4 MG PO TABS
4.0000 mg | ORAL_TABLET | Freq: Four times a day (QID) | ORAL | Status: DC
  Filled 2022-12-30 (×2): qty 1

## 2022-12-30 MED ORDER — AMPHETAMINE-DEXTROAMPHETAMINE 5 MG PO TABS
15.0000 mg | ORAL_TABLET | Freq: Two times a day (BID) | ORAL | Status: DC
  Filled 2022-12-30 (×2): qty 3

## 2022-12-30 MED ORDER — FLAVOXATE HCL 100 MG PO TABS
100.0000 mg | ORAL_TABLET | Freq: Three times a day (TID) | ORAL | Status: DC | PRN
  Administered 2023-01-01 – 2023-01-07 (×3): 100 mg via ORAL
  Filled 2022-12-30 (×6): qty 1

## 2022-12-30 MED ORDER — FENTANYL CITRATE PF 50 MCG/ML IJ SOSY: 50.0000 ug | PREFILLED_SYRINGE | Freq: Once | INTRAMUSCULAR | Status: AC

## 2022-12-30 MED ORDER — BACLOFEN 10 MG PO TABS
20.0000 mg | ORAL_TABLET | Freq: Four times a day (QID) | ORAL | Status: DC
  Administered 2022-12-30 – 2023-01-05 (×23): 20 mg via ORAL
  Filled 2022-12-30 (×27): qty 2

## 2022-12-30 MED ORDER — VANCOMYCIN VARIABLE DOSE PER UNSTABLE RENAL FUNCTION (PHARMACIST DOSING): Status: DC

## 2022-12-30 MED ORDER — ORAL CARE MOUTH RINSE: 15.0000 mL | OROMUCOSAL | Status: DC | PRN

## 2022-12-30 MED ORDER — FENTANYL CITRATE PF 50 MCG/ML IJ SOSY
PREFILLED_SYRINGE | INTRAMUSCULAR | Status: AC
  Administered 2022-12-30: 50 ug via INTRAVENOUS
  Filled 2022-12-30: qty 1

## 2022-12-30 MED ORDER — GABAPENTIN 400 MG PO CAPS: 800.0000 mg | ORAL_CAPSULE | Freq: Four times a day (QID) | ORAL | Status: DC

## 2022-12-30 MED ORDER — METHADONE HCL 10 MG PO TABS: 20.0000 mg | ORAL_TABLET | Freq: Two times a day (BID) | ORAL | Status: DC

## 2022-12-30 MED ORDER — HYDROMORPHONE HCL 1 MG/ML IJ SOLN
0.5000 mg | Freq: Once | INTRAMUSCULAR | Status: AC
  Administered 2022-12-30: 0.5 mg via INTRAVENOUS
  Filled 2022-12-30: qty 1

## 2022-12-30 MED ORDER — GABAPENTIN 400 MG PO CAPS
400.0000 mg | ORAL_CAPSULE | Freq: Four times a day (QID) | ORAL | Status: DC
  Filled 2022-12-30: qty 1

## 2022-12-30 MED ORDER — NOREPINEPHRINE 4 MG/250ML-% IV SOLN
INTRAVENOUS | Status: AC
  Filled 2022-12-30: qty 250

## 2022-12-30 MED ORDER — CHLORHEXIDINE GLUCONATE CLOTH 2 % EX PADS
6.0000 | MEDICATED_PAD | Freq: Every day | CUTANEOUS | Status: DC
  Administered 2022-12-30 – 2023-01-07 (×8): 6 via TOPICAL

## 2022-12-30 MED ORDER — FLAVOXATE HCL 100 MG PO TABS: 100.0000 mg | ORAL_TABLET | Freq: Three times a day (TID) | ORAL | Status: DC | PRN

## 2022-12-30 MED ORDER — LEVOTHYROXINE SODIUM 88 MCG PO TABS
88.0000 ug | ORAL_TABLET | Freq: Every day | ORAL | Status: DC
  Administered 2022-12-30 – 2023-01-07 (×9): 88 ug via ORAL
  Filled 2022-12-30 (×9): qty 1

## 2022-12-30 MED ORDER — MIDODRINE HCL 5 MG PO TABS
10.0000 mg | ORAL_TABLET | Freq: Three times a day (TID) | ORAL | Status: DC
  Administered 2022-12-30 – 2022-12-31 (×3): 10 mg via ORAL
  Filled 2022-12-30 (×3): qty 2

## 2022-12-30 MED ORDER — HYDROMORPHONE HCL 1 MG/ML IJ SOLN: 1.0000 mg | INTRAMUSCULAR | Status: DC | PRN

## 2022-12-30 NOTE — H&P (Addendum)
NAME:  Justin [name], MRN:  [id number], DOB:  28-Aug-1981, LOS: 1 ADMISSION DATE:  12/29/2022, CONSULTATION DATE:  12/29/22 REFERRING MD:  Pilar Jarvis  CHIEF COMPLAINT:  Bilateral Leg spasm    HPI  42 y.o male with significant PMH of  Hypothyroidism, Asthma, incomplete low thoracic/lumbar Spinal cord injury, T7-T12, Spasticity 2/2 incomplete paraplegia  Neurogenic Bladder 2/2 SCI, chronic UTIs due to neurogenic bladder, chronic leg spasms, chronic pain syndrome, chronic constipation, Hypesthesia, Anxiety and Depression  who presented to the ED with chief complaints of bilateral leg spasm, chills and rigor.  Patient reporting intermittent uncontrolled leg spasms without clear cause for his worsening spasticity. Patient states he has intermittent spasms at baseline but usually worsens if he has urinary tract infection.  He has chronic pain/anxiety on high doses of methadone, oxycodone, alprazolam, Baclofen, Gabapentin and tizanidine at home. Per patient's wife at the bedside, patient also report new onset neck pain and headache without vision changes, nausea, vomiting or any other focal neurological deficit.   ED Course: Initial vital signs showed HR of 137 beats/minute, BP 89/59  mm Hg, the RR 34 breaths/minute, and the oxygen saturation 93% on RA and a temperature of 98.72F (36.7C).   Pertinent Labs/Diagnostics Findings: Na+/ K+: 138/3.2 Glucose:103 BUN/Cr.:22/1.33 WBC:0.6 Plts: 121 PCT: 11.68 Lactic acid: 7.2  CTA Chest> pending CT Abd/pelvis>pending  Patient given 30 cc/kg of fluids and started on broad-spectrum antibiotics Vanco cefepime and Flagyl for  suspected sepsis secondary to UTI. Patient remained hypotensive despite IVF boluses therefore was started on Levophed.  (Sepsis reassessment completed). PCCM consulted.  Past Medical History  Hypothyroidism, Asthma, incomplete low thoracic/lumbar Spinal cord injury, T7-T12, Spasticity 2/2 incomplete paraplegia  Neurogenic Bladder  2/2 SCI, chronic UTIs due to neurogenic bladder, chronic leg spasms, chronic pain syndrome, chronic constipation, Hypesthesia, Anxiety and Depression  who presented to the ED with chief complaints of bilateral leg spasm, chills and rigor.  Significant Hospital Events   4/13: Admit to ICU with severe sepsis secondary to suspected UTI with shock requiring pressors  Consults:  None  Procedures:  None  Significant Diagnostic Tests:  `4/13: CTA abdomen and pelvis> 4/13: CTA Chest>  Interim History / Subjective:  -  Micro Data:  4/13: Blood culture x2> 4/13: Urine Culture> 4/13: MRSA PCR>>   Antimicrobials:  Vancomycin 4/13> Cefepime 4/13> Metronidazole 4/13>  OBJECTIVE  Blood pressure (!) 76/49, pulse (!) 117, temperature 98.1 F (36.7 C), temperature source Oral, resp. rate (!) 22, height 6\' 5"  (1.956 m), weight 91.5 kg, SpO2 91 %.       No intake or output data in the 24 hours ending 12/30/22 0136 Filed Weights   12/29/22 1932 12/30/22 0035  Weight: 90.4 kg 91.5 kg   Physical Examination  GENERAL: 42 year-old critically ill patient lying in the bed in no acute distress EYES: PEERLA. No scleral icterus. Extraocular muscles intact.  HEENT: Head atraumatic, normocephalic. Oropharynx and nasopharynx clear.  NECK:  No JVD, supple  LUNGS: Normal breath sounds bilaterally.  No use of accessory muscles of respiration.  CARDIOVASCULAR: S1, S2 normal. No murmurs, rubs, or gallops.  ABDOMEN: Soft, NTND EXTREMITIES: No swelling or erythema.  Capillary refill < 3 seconds in all extremities. Pulses palpable distally. Paraplegic with bilateral foot droop. NEUROLOGIC: The patient is alert with intermittent confusion otherwise oriented  x 3. No focal neurological deficit. Moves all bilateral upper extremities spontaneously.  Cranial nerves are intact.  SKIN: No obvious rash, lesion, or ulcer. Warm to touch  Labs/imaging that I havepersonally reviewed  (right click and "Reselect all  SmartList Selections" daily)     Labs   CBC: Recent Labs  Lab 12/29/22 2006  WBC 0.6*  NEUTROABS 0.5*  HGB 13.1  HCT 41.2  MCV 86.4  PLT 121*    Basic Metabolic Panel: Recent Labs  Lab 12/29/22 2006  NA 138  K 3.2*  CL 103  CO2 22  GLUCOSE 103*  BUN 22*  CREATININE 1.33*  CALCIUM 8.8*  MG 1.4*  PHOS <1.0*   GFR: Estimated Creatinine Clearance: 91.2 mL/min (A) (by C-G formula based on SCr of 1.33 mg/dL (H)). Recent Labs  Lab 12/29/22 2006  PROCALCITON 11.68  WBC 0.6*  LATICACIDVEN 7.2*    Liver Function Tests: No results for input(s): "AST", "ALT", "ALKPHOS", "BILITOT", "PROT", "ALBUMIN" in the last 168 hours. No results for input(s): "LIPASE", "AMYLASE" in the last 168 hours. No results for input(s): "AMMONIA" in the last 168 hours.  ABG No results found for: "PHART", "PCO2ART", "PO2ART", "HCO3", "TCO2", "ACIDBASEDEF", "O2SAT"   Coagulation Profile: No results for input(s): "INR", "PROTIME" in the last 168 hours.  Cardiac Enzymes: Recent Labs  Lab 12/29/22 2006  CKTOTAL 94    HbA1C: No results found for: "HGBA1C"  CBG: Recent Labs  Lab 12/30/22 0016  GLUCAP 104*    Review of Systems:   UNABLE TO OBTAIN DUE TO INTERMITTENT CONFUSION  Past Medical History  He,  has a past medical history of Arthritis, Asthma, Chronic anxiety, Chronic depression, Chronic pain syndrome, Postoperative wound infection of right hip (2012), and Rheumatic fever.   Surgical History    Past Surgical History:  Procedure Laterality Date   BACK SURGERY  2000   Fracture T9 and L3 with paraplegia     Social History   reports that he has quit smoking. He has been exposed to tobacco smoke. He has never used smokeless tobacco. He reports current alcohol use. He reports that he does not use drugs.   Family History   His family history includes Gout in his father; Hypertension in his father; Other in his mother. There is no history of Prostate cancer or Kidney cancer.    Allergies Allergies  Allergen Reactions   Morphine     psychosis   Azithromycin     throat closes   Lorazepam     extremely hostile   Meperidine    Morphine Sulfate     Psychotic symptoms     Home Medications  Prior to Admission medications   Medication Sig Start Date End Date Taking? Authorizing Provider  albuterol (VENTOLIN HFA) 108 (90 Base) MCG/ACT inhaler Inhale 2 puffs into the lungs every 6 (six) hours as needed for wheezing or shortness of breath. 10/03/21   Malva Limes, MD  alprazolam Prudy Feeler) 2 MG tablet TAKE ONE TABLET EVERY SIX HOURS IF NEEDED 11/07/22   Malva Limes, MD  amphetamine-dextroamphetamine (ADDERALL) 15 MG tablet TAKE ONE (1) TABLET BY MOUTH TWO TIMES PER DAY 12/07/22   Malva Limes, MD  azelastine (OPTIVAR) 0.05 % ophthalmic solution Place 1 drop into both eyes 2 (two) times daily. 03/19/22   Ostwalt, Edmon Crape, PA-C  baclofen (LIORESAL) 20 MG tablet TAKE ONE TABLET FOUR TIMES DAILY 01/26/22   Malva Limes, MD  diphenhydrAMINE (BENADRYL) 50 MG capsule Take 1 capsule 1 hour before procedure 05/22/22   Malva Limes, MD  docusate sodium (COLACE) 250 MG capsule Take 1 capsule (250 mg total) by mouth 4 (four) times  daily as needed. 08/10/15   Malva Limes, MD  dronabinol (MARINOL) 10 MG capsule Take 1 capsule (10 mg total) by mouth every 6 (six) hours as needed. 04/18/16   Malva Limes, MD  fesoterodine (TOVIAZ) 8 MG TB24 tablet TAKE 1 TABLET BY MOUTH DAILY 05/27/22   Malva Limes, MD  flavoxATE (URISPAS) 100 MG tablet Take 1 tablet (100 mg total) by mouth 3 (three) times daily as needed for bladder spasms. 08/22/20   Chrismon, Jodell Cipro, PA-C  fluticasone (FLONASE) 50 MCG/ACT nasal spray Place 2 sprays into both nostrils daily. 07/04/21   Muthersbaugh, Dahlia Client, PA-C  gabapentin (NEURONTIN) 800 MG tablet Take 1 tablet (800 mg total) by mouth 4 (four) times daily. 11/30/15   Malva Limes, MD  ketoconazole (NIZORAL) 2 % cream Apply 1 Application  topically 2 (two) times daily. 05/22/22   Malva Limes, MD  levothyroxine (SYNTHROID) 88 MCG tablet TAKE ONE TABLET BY MOUTH EVERY DAY 12/11/22   Malva Limes, MD  lidocaine (LMX) 4 % cream Apply 1 Application topically 3 (three) times daily. 04/18/22   Debera Lat, PA-C  methadone (DOLOPHINE) 10 MG tablet TAKE 2 TABLETS BY MOUTH EVERY 6 HOURS 12/07/22   Malva Limes, MD  Mineral Oil OIL daily.    [provider]  naproxen sodium (ANAPROX) 550 MG tablet TAKE 1 TABLET BY MOUTH 2 TIMES DAILY WITH A MEAL. 11/07/22   Malva Limes, MD  omeprazole (PRILOSEC) 20 MG capsule TAKE 1 CAPSULE BY MOUTH ONCE DAILY 07/19/22   Malva Limes, MD  oxybutynin (DITROPAN) 5 MG tablet Take 5 mg by mouth daily as needed. 11/27/21   [provider]  oxyCODONE (ROXICODONE) 15 MG immediate release tablet Take one tablet every 4-5 hours as needed, no more than 5 in a day 12/19/22   Malva Limes, MD  phenazopyridine (PYRIDIUM) 200 MG tablet Take 1 tablet (200 mg total) by mouth 3 (three) times daily. 09/17/19   Chrismon, Jodell Cipro, PA-C  Plecanatide (TRULANCE) 3 MG TABS Take 1 tablet (3 mg total) by mouth daily. 12/17/22   Malva Limes, MD  polyethylene glycol powder (QC NATURA-LAX) 17 GM/SCOOP powder MIX 1 CAPFUL IN 8 0Z OF WATER OR JUICE AND DRINK ONCE A DAY 12/25/21   Malva Limes, MD  promethazine (PHENERGAN) 25 MG suppository PLACE 1 SUPPOSITORY RECTALLY EVERY 6 HOURS AS NEEDED FOR NAUSEA OR VOMITING 11/07/22   Malva Limes, MD  promethazine (PHENERGAN) 25 MG tablet TAKE 1 TABLET BY MOUTH EVERY 4 HOURS AS NEEDED FOR NAUSEA AND VOMITING 12/12/22   Malva Limes, MD  sildenafil (REVATIO) 20 MG tablet Take 3 to 5 tablets two hours before intercouse on an empty stomach.  Do not take with nitrates. 06/26/22   Malva Limes, MD  silver sulfADIAZINE (SSD) 1 % cream APPLY TO AFFECTED AREAS TOPICALLY TWICE DAILY 03/28/21   Malva Limes, MD  tiZANidine (ZANAFLEX) 4 MG tablet TAKE 2  TABLETS BY MOUTH 4 TIMES DAILY 07/20/22   Malva Limes, MD  Scheduled Meds:  amphetamine-dextroamphetamine  15 mg Oral BID   gabapentin  800 mg Oral QID   heparin  5,000 Units Subcutaneous Q8H   insulin aspart  0-15 Units Subcutaneous Q4H   levothyroxine  88 mcg Oral Q0600   methadone  20 mg Oral BID   naproxen  500 mg Oral BID WC   Continuous Infusions:  sodium chloride     dexmedetomidine (PRECEDEX)  IV infusion 0.4 mcg/kg/hr (12/30/22 0137)   lactated ringers     metronidazole     norepinephrine (LEVOPHED) Adult infusion 4 mcg/min (12/29/22 2317)   potassium PHOSPHATE IVPB (in mmol) 30 mmol (12/30/22 0115)   PRN Meds:.docusate sodium, flavoxATE, iohexol, polyethylene glycol   Active Hospital Problem list    Assessment & Plan:  #Severe Sepsis with Shock possible UTI: has a history of chronic/recurrent UTIs due to neurogenic bladder. From review of care everywhere he has grown organisms sensitive to ceftriaxone. Urine culture pending meets SIRS criteria: Heart Rate 137 beats/minute, Respiratory Rate 34 breaths/minute,Temperature , Shock Index (SI) - Supplemental oxygen as needed, to maintain SpO2 > 90% - F/u cultures, trend lactic/ PCT - Monitor WBC/ fever curve - IV antibiotics: cefepime & vancomycin  - IVF hydration as needed - Pressors for MAP goal >65 - Strict I/O's  #Spasticity 2/2 incomplete paraplegia #Autonomic Dysreflexia patient reporting worsening intermittent uncontrolled leg spasms since 3 pm. Likely in the setting of UTI otherwise no clear cause for his worsening spasticity. He is on high doses of methadone, oxycodone, alprazolam, and tizanidine at home.  - Continued dilaudid  IVQ4-6 PRN, will need to transition to po or consider PCA - Continued home methadone 20 mg TID -monitor QTc on EKG  - Continued baclofen 20 mg qid and tizanidine 8 mg qid for now  - Continued Gabapentin as scheduled  - Treating constipation as below.  #Neurogenic Bladder and Bowel  2/2 SCI,  - Continue in and out caths. - Oxybutynin  TID - Continue Flavoxate (Urispas) - Lactulose PRN - Colace TID, senna Qnoon, bisacodyl at 1800 daily make his home lactulose available as well. PM&R recs colace 100 TID, Senna 2 tabs noon, bisacodyl PR at 1800 daily. -Try to continue home Amitiza as well. - Urology consult  #Hypothyroidism TSH:1.5 Free T4:1.23 - Continue home synthroid     Best practice:  Diet:  Oral Pain/Anxiety/Delirium protocol (if indicated): No VAP protocol (if indicated): Not indicated DVT prophylaxis: Subcutaneous Heparin GI prophylaxis: N/A Glucose control:  SSI Yes Central venous access:  N/A Arterial line:  N/A Foley:  Yes, and it is still needed Mobility:  bed rest  PT consulted: N/A Last date of multidisciplinary goals of care discussion [4/13] Code Status:  full code Disposition: ICU   = Goals of Care = Code Status Order: FULL  Primary Emergency Contact: Swaby,Ramona, Home Phone: [phone] Wishes to pursue full aggressive treatment and intervention options, including CPR and intubation, but goals of care will be addressed on going with family if that should become necessary.   Critical care time: 45 minutes       Webb Silversmith DNP, CCRN, FNP-C, AGACNP-BC Acute Care & Family Nurse Practitioner [facility] PCCM on call pager [phone]

## 2022-12-30 NOTE — Progress Notes (Signed)
I stepped out of patients room to attend to my other patient. When I returned to his room, the patient was knocked out asleep, Systolic BP dropped to the 60s while on 13 mcg/min of Levo, HR dropped from 130's-140's to low 100's & his O2 dropped down to 89-90% on room air. Wife at the bedside during this time. 2L of O2 via [place] placed on pt. I asked his wife if he wears oxygen at home and she said no. I educated her on the importance of not administering home meds while the pt is in the hospital. All po meds held at this time. Lanora Manis NP aware.

## 2022-12-30 NOTE — Progress Notes (Signed)
PHARMACY CONSULT NOTE - FOLLOW UP  Pharmacy Consult for Electrolyte Monitoring and Replacement   Recent Labs: Potassium (mmol/L)  Date Value  12/30/2022 3.7  06/28/2014 3.6   Magnesium (mg/dL)  Date Value  32/35/5732 2.0   Calcium (mg/dL)  Date Value  20/25/4270 8.5 (L)   Calcium, Total (mg/dL)  Date Value  62/37/6283 8.3 (L)   Albumin (g/dL)  Date Value  15/17/6160 3.4 (L)  04/20/2022 4.6  06/25/2014 4.0   Phosphorus (mg/dL)  Date Value  73/71/0626 1.6 (L)   Sodium (mmol/L)  Date Value  12/30/2022 138  04/20/2022 139  06/28/2014 139     Assessment: 42 y.o male with significant PMH of  Hypothyroidism, Asthma, incomplete low thoracic/lumbar Spinal cord injury, T7-T12, Spasticity 2/2 incomplete paraplegia  Neurogenic Bladder 2/2 SCI, chronic UTIs due to neurogenic bladder, chronic leg spasms, chronic pain syndrome, chronic constipation, Hypesthesia, Anxiety and Depression  who presented to the ED with chief complaints of bilateral leg spasm, chills and rigor.   Scr trending up. Baseline around 0.8.   Goal of Therapy:  Electrolytes WNL   Plan:  Kphos 30 mmol IV x 1.  F/u with AM labs.    Ronnald Ramp ,PharmD Clinical Pharmacist 12/30/2022 8:53 AM

## 2022-12-30 NOTE — Progress Notes (Signed)
Progress Note   Updated pts wife [name] via telephone regarding pts condition and current plan of care via telephone per pts request.  Answered all questions.  PT remains alert and oriented.  Will continue to monitor and assess pt   Zada Girt, [facility]  Pulmonary/Critical Care Pager [phone] (please enter 7 digits) PCCM Consult Pager [phone] (please enter 7 digits)

## 2022-12-30 NOTE — Procedures (Signed)
Central Venous Catheter Insertion Procedure Note  Tyronne Doose  [id number]  04/15/1981  Date:12/30/22  Time:5:04 PM   Provider Performing:Anberlin Diez Earnest Conroy   Procedure: Insertion of Non-tunneled Central Venous [phone]) with US guidance ([id number])   Indication(s) Difficult access  Consent Risks of the procedure as well as the alternatives and risks of each were explained to the patient and/or caregiver.  Consent for the procedure was obtained and is signed in the bedside chart  Anesthesia Topical only with 1% lidocaine   Timeout Verified patient identification, verified procedure, site/side was marked, verified correct patient position, special equipment/implants available, medications/allergies/relevant history reviewed, required imaging and test results available.  Sterile Technique Maximal sterile technique including full sterile barrier drape, hand hygiene, sterile gown, sterile gloves, mask, hair covering, sterile ultrasound probe cover (if used).  Procedure Description Area of catheter insertion was cleaned with chlorhexidine and draped in sterile fashion.  With real-time ultrasound guidance a central venous catheter was placed into the left internal jugular vein. Nonpulsatile blood flow and easy flushing noted in all ports.  The catheter was sutured in place and sterile dressing applied.  Complications/Tolerance None; patient tolerated the procedure well. Chest X-ray is ordered to verify placement for internal jugular or subclavian cannulation.   Chest x-ray is not ordered for femoral cannulation.  EBL Minimal  Specimen(s) None  Zada Girt, AGNP  Pulmonary/Critical Care Pager [phone] (please enter 7 digits) PCCM Consult Pager [phone] (please enter 7 digits)

## 2022-12-30 NOTE — Progress Notes (Signed)
An USGPIV (ultrasound guided PIV) has been placed for short-term vasopressor infusion. A correctly placed ivWatch must be used when administering Vasopressors. Should this treatment be needed beyond 72 hours, central line access should be obtained.  It will be the responsibility of the bedside nurse to follow best practice to prevent extravasations.   

## 2022-12-30 NOTE — Progress Notes (Signed)
Patient arrived to ICU 18 from the ED increasingly restless and thrashing about in the bed intermittently as a result of his sudden and painful leg spasms. Flailing arms and restlessness made it difficult to measure an accurate blood pressure and maintain IV access. The patient arrived with an existing IV in the right arm. IV team inserted an additional IV in the left arm, and medications were attempted to be administered (see eMAR). Every time a medication was infused through either of the patient's 2 PIVs, he would complain of burning and ask that the medication be stopped. Justin Silversmith NP intermittently at the bedside, and aware of repetitive loss of IV access. NP is aware that several medications were unable to be administered d/t lack of efficient IV access, and patient is unable to stay still long enough for central line insertion.   At 3:15 A.M., phlebotomy informed the RN that the patient's wife, Justin [name], was at the bedside intermittent catheterizing the patient. Hospital doctor were made aware, as well as Justin Silversmith NP. The patient usually self catheterizes at home, and his wife lends help when he is unwell. The patient and his wife were educated and informed that while he remains in the ICU, the nurses will be responsible for catheters in order to assure sterile technique and accurately monitor I&Os. The patient and his wife understood and agreed.

## 2022-12-30 NOTE — Progress Notes (Signed)
Patient and wife at bedside. Noticed 2 In/Out catheters in trash. Patient reports doing self caths x2 that morning. Education on infection control provided, patient asked to refrain from self caths. Patient refused. Patient refusing to keep blood pressure cuff on, continually removing cuff. Patient unable to remain still in order for cuff to read. Education provided to patient. Will continue to attempt rechecks and monitor.   PCA running. Patient anxious. Patient continued to self cath.    Patient resting in bed, arousable with pain. Medication stopped. MD notified of hypotension. Medications started. Continued to monitor. Md notified of changes/progress.  CVC placed. Tolerated well.  Patient alert and oriented, c/o of muscle spasms. MD aware. Medications given.   Patient frequently screams out or calls out requesting medications and tasks. Nurse unable to leave room most of afternoon due to patient needs/request. Patient is anxious, complains of intermittent pain/spasms. Medications given. Patient education provided on pain management, patient safety, and plan of care. Verbalizes understanding, needs frequent reminders.

## 2022-12-30 NOTE — Progress Notes (Signed)
Progress Note  Clarified pts home dose of gabapentin with pt.  He states he does not take scheduled 800 mg of gabapentin 4 times daily at home despite what is listed on his medication list.  He states he takes 400 mg 4 times daily prn at home instead.  Pt asked if I would change the medication dose to 400 mg prn 4 times daily while he is here in the hospital.  Attempted to contact pts wife Brendon Collington via telephone to clarify dose with her, however she did not answer the telephone.  Therefore, HIPAA appropriate voicemail left instructing her to return my phone call.  For now changed gabapentin dose to 400 mg 4 times daily prn per pts request.   Pt is alert and oriented.  Will continue to monitor and assess pt.    Zada Girt, AGNP  Pulmonary/Critical Care Pager [phone] (please enter 7 digits) PCCM Consult Pager [phone] (please enter 7 digits)

## 2022-12-30 NOTE — Consult Note (Addendum)
Pharmacy Antibiotic Note  Justin [name] is a 42 y.o. male admitted on 12/29/2022 with  sepsis and UTI .  Pharmacy has been consulted for cefepime and vancomycin dosing.  Assessment: 42 yo M with PMH hypothyroidism, ashthma, spinal cord injury, neurogenic bladder, chronic UTIs presents with bilateral leg spasms, chills and rigor. Pt reports that worsened spasms normally happens when he has a UTI. Pt is afebrile, but tachycardic and tachypneic with O2 Sats in 80s on RA. He is currently in AKI (baseline Scr 0.8). Patient received vancomycin, cefepime, and metronidazole x 1 last night after arrival.  Patient parameters and calculations: Vd = 66 L Estimated Cmax from 2000 mg vancomycin load = 30 mg/L With current renal function, vancomycin 1000 mg IV q12H results in: AUC 454, Cmax 27.5, Cmin 13.2  Plan: Initiate vancomycin 1000 mg IV q12H. Justin obtain vanc level prior to next dose Initiate cefepime 2 g IV q8H Follow up culture results to assess for antibiotic optimization Monitor renal function to assess for any necessary antibiotic dosing changes  Height: 6\' 5"  (195.6 cm) Weight: 91.5 kg (201 lb 11.5 oz) IBW/kg (Calculated) : 89.1  Temp (24hrs), Avg:98.9 F (37.2 C), Min:98.1 F (36.7 C), Max:99.3 F (37.4 C)  Recent Labs  Lab 12/29/22 2006 12/30/22 0215 12/30/22 0242  WBC 0.6*  --  4.0  CREATININE 1.33*  --  1.61*  LATICACIDVEN 7.2* 5.1*  --     Estimated Creatinine Clearance: 75.3 mL/min (A) (by C-G formula based on SCr of 1.61 mg/dL (H)).    Allergies  Allergen Reactions   Morphine     psychosis   Azithromycin     throat closes   Lorazepam     extremely hostile   Meperidine    Morphine Sulfate     Psychotic symptoms    Antimicrobials this admission: Cefepime 4/13 >>  Vancomycin 4/13 >>  Metronidazole 4/13 x 1  Dose adjustments this admission: N/A  Microbiology results: 4/14 BCx: NG<12 hrs 4/13 UCx: collected   Thank you for allowing pharmacy  to be a part of this patient's care.  Justin [name], PharmD PGY-1 Pharmacy Resident 12/30/2022 7:21 AM

## 2022-12-30 NOTE — Progress Notes (Signed)
eLink Physician-Brief Progress Note Patient Name: Justin [name] DOB: Jun 27, 1981 MRN: [id number]   Date of Service  12/30/2022  HPI/Events of Note  Brief new admit: 42 y.o. male   Past medical history of paraplegia from accident, neurogenic bladder, dysautonomia, frequent UTIs, frequent leg spasms, depression, anxiety, chronic pain syndrome, asthma Admitted to ICU for  Septic shock: source. UTI vs pyelonephritis.elevated pro cal, leukopenic/neutropenic sepsis. - s/p 30 cc/kg bolus, on appropriate abx, cultures. Levophed to keep MAP > 65 - follow UOP - follow CT reports, if has any obstructive uropathy, to call Urology stat for stenting. - avoid nephrotoxic meds - neutropenic precautions. - on ssi : cbg goals < 180 -VTE: SQ heparin.   Camera eval done. On room air, SBP soft.  97% sats. HR 110. Temp 99. Talking to bed side RN.  Data reviewed    eICU Interventions       Intervention Category Major Interventions: Sepsis - evaluation and management;Infection - evaluation and management;Acute renal failure - evaluation and management;Shock - evaluation and management Evaluation Type: New Patient Evaluation  Ranee Gosselin 12/30/2022, 12:20 AM

## 2022-12-30 NOTE — H&P (Signed)
NAME:  Justin [name], MRN:  [id number], DOB:  05-18-1981, LOS: 1 ADMISSION DATE:  12/29/2022, CONSULTATION DATE:  12/29/22 REFERRING MD:  Pilar Jarvis  CHIEF COMPLAINT:  Bilateral Leg spasm    HPI  42 y.o male with significant PMH of  Hypothyroidism, Asthma, incomplete low thoracic/lumbar Spinal cord injury, T7-T12, Spasticity 2/2 incomplete paraplegia  Neurogenic Bladder 2/2 SCI, chronic UTIs due to neurogenic bladder, chronic leg spasms, chronic pain syndrome, chronic constipation, Hypesthesia, Anxiety and Depression  who presented to the ED with chief complaints of bilateral leg spasm, chills and rigor.  Patient reporting intermittent uncontrolled leg spasms without clear cause for his worsening spasticity. Patient states he has intermittent spasms at baseline but usually worsens if he has urinary tract infection.  He has chronic pain/anxiety on high doses of methadone, oxycodone, alprazolam, Baclofen, Gabapentin and tizanidine at home. Per patient's wife at the bedside, patient also report new onset neck pain and headache without vision changes, nausea, vomiting or any other focal neurological deficit.   ED Course: Initial vital signs showed HR of 137 beats/minute, BP 89/59  mm Hg, the RR 34 breaths/minute, and the oxygen saturation 93% on RA and a temperature of 98.27F (36.7C).   Pertinent Labs/Diagnostics Findings: Na+/ K+: 138/3.2 Glucose:103 BUN/Cr.:22/1.33 WBC:0.6 Plts: 121 PCT: 11.68 Lactic acid: 7.2  CTA Chest> pending CT Abd/pelvis>pending  Patient given 30 cc/kg of fluids and started on broad-spectrum antibiotics Vanco cefepime and Flagyl for  suspected sepsis secondary to UTI. Patient remained hypotensive despite IVF boluses therefore was started on Levophed.  (Sepsis reassessment completed). PCCM consulted.  Past Medical History  Hypothyroidism, Asthma, incomplete low thoracic/lumbar Spinal cord injury, T7-T12, Spasticity 2/2 incomplete paraplegia  Neurogenic Bladder  2/2 SCI, chronic UTIs due to neurogenic bladder, chronic leg spasms, chronic pain syndrome, chronic constipation, Hypesthesia, Anxiety and Depression  who presented to the ED with chief complaints of bilateral leg spasm, chills and rigor.  Significant Hospital Events   4/13: Admit to ICU with severe sepsis secondary to suspected UTI with shock requiring pressors 4/14   Consults:  None  Procedures:  None  Significant Diagnostic Tests:  4/13: CTA abdomen and pelvis> 4/13: CTA Chest>  Interim History / Subjective:  -  Micro Data:  4/13: Blood culture x2> 4/13: Urine Culture> 4/13: MRSA PCR>>   Antimicrobials:  Vancomycin 4/13> Cefepime 4/13> Metronidazole 4/13>  OBJECTIVE  Blood pressure (!) 94/46, pulse (!) 138, temperature 99.3 F (37.4 C), temperature source Oral, resp. rate (!) 27, height  (1.956 m), weight 91.5 kg, SpO2 (!) 88 %.       No intake or output data in the 24 hours ending 12/30/22 0757 Filed Weights   12/29/22 1932 12/30/22 0035 12/30/22 0500  Weight: 90.4 kg 91.5 kg 91.5 kg       Review of Systems: Gen:  Denies  fever, sweats, chills weight loss  Resp:   No cough, -sputum production, -shortness of breath,-wheezing, -hemoptysis,  Other:  All other systems negative   Physical Examination:   General Appearance: No distress  EYES PERRLA, EOM intact.   NECK Supple, No JVD Pulmonary: normal breath sounds, No wheezing.  CardiovascularNormal S1,S2.  No m/r/g.   Abdomen: Benign, Soft, non-tender. Neurology LE 0/5 strength, no focal deficits Ext pulses intact, cap refill intact ALL OTHER ROS ARE NEGATIVE  Labs/imaging that I havepersonally reviewed  (right click and "Reselect all SmartList Selections" daily)     Labs   CBC: Recent Labs  Lab 12/29/22 2006 12/30/22  0242  WBC 0.6* 4.0  NEUTROABS 0.5*  --   HGB 13.1 12.7*  HCT 41.2 39.7  MCV 86.4 86.5  PLT 121* 99*     Basic Metabolic Panel: Recent Labs  Lab 12/29/22 2006  12/30/22 0241 12/30/22 0242  NA 138  --  138  K 3.2*  --  3.7  CL 103  --  106  CO2 22  --  22  GLUCOSE 103*  --  91  BUN 22*  --  25*  CREATININE 1.33*  --  1.61*  CALCIUM 8.8*  --  8.5*  MG 1.4* 2.0  --   PHOS <1.0* 1.6*  --     GFR: Estimated Creatinine Clearance: 75.3 mL/min (A) (by C-G formula based on SCr of 1.61 mg/dL (H)). Recent Labs  Lab 12/29/22 2006 12/30/22 0215 12/30/22 0242  PROCALCITON 11.68  --   --   WBC 0.6*  --  4.0  LATICACIDVEN 7.2* 5.1*  --      Liver Function Tests: Recent Labs  Lab 12/30/22 0241  AST 390*  ALT 430*  ALKPHOS 84  BILITOT 2.3*  PROT 5.8*  ALBUMIN 3.4*   No results for input(s): "LIPASE", "AMYLASE" in the last 168 hours. No results for input(s): "AMMONIA" in the last 168 hours.  ABG No results found for: "PHART", "PCO2ART", "PO2ART", "HCO3", "TCO2", "ACIDBASEDEF", "O2SAT"   Coagulation Profile: No results for input(s): "INR", "PROTIME" in the last 168 hours.  Cardiac Enzymes: Recent Labs  Lab 12/29/22 2006  CKTOTAL 94     HbA1C: Hgb A1c MFr Bld  Date/Time Value Ref Range Status  12/29/2022 08:06 PM 5.1 4.8 - 5.6 % Final    Comment:    (NOTE) Pre diabetes:          5.7%-6.4%  Diabetes:              >6.4%  Glycemic control for   <7.0% adults with diabetes     CBG: Recent Labs  Lab 12/30/22 0016 12/30/22 0334 12/30/22 0741  GLUCAP 104* 101* 79    Assessment & Plan:  Severe Sepsis with Shock possible UTI: has a history of chronic/recurrent UTIs due to neurogenic bladder. From review of care everywhere he has grown organisms sensitive to ceftriaxone. Urine culture pending meets SIRS criteria: Heart Rate 137 beats/minute, Respiratory Rate 34 breaths/minute,Temperature , Shock Index (SI) - Supplemental oxygen as needed, to maintain SpO2 > 90% - F/u cultures, trend lactic/ PCT - Monitor WBC/ fever curve - IV antibiotics: cefepime & vancomycin  - IVF hydration as needed - Pressors for MAP goal >65 -  Strict I/O's  Spasticity 2/2 incomplete paraplegia Autonomic Dysreflexia patient reporting worsening intermittent uncontrolled leg spasms since 3 pm. Likely in the setting of UTI otherwise no clear cause for his worsening spasticity. He is on high doses of methadone, oxycodone, alprazolam, and tizanidine at home.  - Continued dilaudid 1mg  IVQ4-6 PRN, will need to transition to PCA - Continued home methadone 20 mg TID -monitor QTc on EKG  - Continued baclofen 20 mg qid and tizanidine 8 mg qid for now  - Continued Gabapentin as scheduled  - Treating constipation as below.  Neurogenic Bladder and Bowel 2/2 SCI,  - Continue in and out caths. - Oxybutynin 5mg  TID - Continue Flavoxate (Urispas) - Lactulose PRN - Colace TID, senna Qnoon, bisacodyl at 1800 daily make his home lactulose available as well. PM&R recs colace 100 TID, Senna 2 tabs noon, bisacodyl PR at 1800 daily. -Try to  continue home Amitiza as well.  Hypothyroidism TSH:1.5 Free T4:1.23 - Continue home synthroid  I RECOMMEND  PSYCH CONSULT TO ASSESS PAIN MEDS SEEKING BEHAVIOR AND ALSO PSYCHOGENIC NATURE OF HIS SPACTICITY  Best practice:  Diet:  Oral Pain/Anxiety/Delirium protocol (if indicated): No VAP protocol (if indicated): Not indicated DVT prophylaxis: Subcutaneous Heparin GI prophylaxis: N/A Glucose control:  SSI Yes Central venous access:  N/A Arterial line:  N/A Foley:  Yes, and it is still needed Mobility:  bed rest  PT consulted: N/A Last date of multidisciplinary goals of care discussion [4/13] Code Status:  full code Disposition: ICU    DVT/GI PRX  assessed I Assessed the need for Labs I Assessed the need for Foley I Assessed the need for Central Venous Line Family Discussion when available I Assessed the need for Mobilization I made an Assessment of medications to be adjusted accordingly Safety Risk assessment completed  CASE DISCUSSED IN MULTIDISCIPLINARY ROUNDS WITH ICU TEAM     Critical  Care Time devoted to patient care services described in this note is 35 minutes.     Lucie Leather, M.D.  [facility]  Medical Director [facility] Medical Director [facility] Cardio-Pulmonary Department

## 2022-12-31 ENCOUNTER — Inpatient Hospital Stay: Payer: Medicare HMO

## 2022-12-31 DIAGNOSIS — A419 Sepsis, unspecified organism: Secondary | ICD-10-CM | POA: Diagnosis not present

## 2022-12-31 DIAGNOSIS — N3000 Acute cystitis without hematuria: Secondary | ICD-10-CM | POA: Diagnosis not present

## 2022-12-31 DIAGNOSIS — R6521 Severe sepsis with septic shock: Secondary | ICD-10-CM

## 2022-12-31 DIAGNOSIS — G8220 Paraplegia, unspecified: Secondary | ICD-10-CM | POA: Diagnosis not present

## 2022-12-31 LAB — CBC WITH DIFFERENTIAL/PLATELET
Abs Immature Granulocytes: 0.69 10*3/uL — ABNORMAL HIGH (ref 0.00–0.07)
Basophils Absolute: 0.1 10*3/uL (ref 0.0–0.1)
Basophils Relative: 0 %
Eosinophils Absolute: 0 10*3/uL (ref 0.0–0.5)
Eosinophils Relative: 0 %
HCT: 37.9 % — ABNORMAL LOW (ref 39.0–52.0)
Hemoglobin: 12.7 g/dL — ABNORMAL LOW (ref 13.0–17.0)
Immature Granulocytes: 4 %
Lymphocytes Relative: 6 %
Lymphs Abs: 0.9 10*3/uL (ref 0.7–4.0)
MCH: 28.3 pg (ref 26.0–34.0)
MCHC: 33.5 g/dL (ref 30.0–36.0)
MCV: 84.4 fL (ref 80.0–100.0)
Monocytes Absolute: 0.9 10*3/uL (ref 0.1–1.0)
Monocytes Relative: 5 %
Neutro Abs: 14.2 10*3/uL — ABNORMAL HIGH (ref 1.7–7.7)
Neutrophils Relative %: 85 %
Platelets: 77 10*3/uL — ABNORMAL LOW (ref 150–400)
RBC: 4.49 MIL/uL (ref 4.22–5.81)
RDW: 14.5 % (ref 11.5–15.5)
Smear Review: NORMAL
WBC: 16.8 10*3/uL — ABNORMAL HIGH (ref 4.0–10.5)
nRBC: 0 % (ref 0.0–0.2)

## 2022-12-31 LAB — URINE CULTURE: Culture: NO GROWTH

## 2022-12-31 LAB — BASIC METABOLIC PANEL
Anion gap: 5 (ref 5–15)
BUN: 26 mg/dL — ABNORMAL HIGH (ref 6–20)
CO2: 28 mmol/L (ref 22–32)
Calcium: 8.3 mg/dL — ABNORMAL LOW (ref 8.9–10.3)
Chloride: 109 mmol/L (ref 98–111)
Creatinine, Ser: 1.28 mg/dL — ABNORMAL HIGH (ref 0.61–1.24)
GFR, Estimated: >60 mL/min (ref >60)
Glucose, Bld: 127 mg/dL — ABNORMAL HIGH (ref 70–99)
Potassium: 4.3 mmol/L (ref 3.5–5.1)
Sodium: 142 mmol/L (ref 135–145)

## 2022-12-31 LAB — PHOSPHORUS: Phosphorus: 3 mg/dL (ref 2.5–4.6)

## 2022-12-31 LAB — GLUCOSE, CAPILLARY
Glucose-Capillary: 104 mg/dL — ABNORMAL HIGH (ref 70–99)
Glucose-Capillary: 107 mg/dL — ABNORMAL HIGH (ref 70–99)
Glucose-Capillary: 82 mg/dL (ref 70–99)
Glucose-Capillary: 110 mg/dL — ABNORMAL HIGH (ref 70–99)
Glucose-Capillary: 133 mg/dL — ABNORMAL HIGH (ref 70–99)
Glucose-Capillary: 75 mg/dL (ref 70–99)
Glucose-Capillary: 102 mg/dL — ABNORMAL HIGH (ref 70–99)

## 2022-12-31 LAB — MAGNESIUM: Magnesium: 2 mg/dL (ref 1.7–2.4)

## 2022-12-31 LAB — LACTIC ACID, PLASMA: Lactic Acid, Venous: 1.6 mmol/L (ref 0.5–1.9)

## 2022-12-31 LAB — CULTURE, BLOOD (ROUTINE X 2): Special Requests: ADEQUATE

## 2022-12-31 MED ORDER — AMPHETAMINE-DEXTROAMPHETAMINE 10 MG PO TABS: 15.0000 mg | ORAL_TABLET | Freq: Every day | ORAL | Status: DC | PRN

## 2022-12-31 MED ORDER — ONDANSETRON 4 MG PO TBDP: 4.0000 mg | ORAL_TABLET | Freq: Three times a day (TID) | ORAL | Status: DC | PRN

## 2022-12-31 MED ORDER — ACETAMINOPHEN 10 MG/ML IV SOLN: 1000.0000 mg | Freq: Four times a day (QID) | INTRAVENOUS | Status: AC | PRN

## 2022-12-31 MED ORDER — OXYCODONE HCL 5 MG PO TABS
15.0000 mg | ORAL_TABLET | Freq: Four times a day (QID) | ORAL | Status: DC | PRN
  Administered 2022-12-31 – 2023-01-06 (×13): 15 mg via ORAL
  Filled 2022-12-31 (×13): qty 3

## 2022-12-31 MED ORDER — ONDANSETRON HCL 4 MG/2ML IJ SOLN
4.0000 mg | Freq: Four times a day (QID) | INTRAMUSCULAR | Status: DC | PRN
  Administered 2022-12-31 – 2023-01-02 (×6): 4 mg via INTRAVENOUS
  Filled 2022-12-31 (×8): qty 2

## 2022-12-31 MED ORDER — MIDODRINE HCL 5 MG PO TABS
10.0000 mg | ORAL_TABLET | Freq: Four times a day (QID) | ORAL | Status: DC
  Administered 2022-12-31 – 2023-01-02 (×8): 10 mg via ORAL
  Filled 2022-12-31 (×8): qty 2

## 2022-12-31 MED ORDER — MORPHINE SULFATE (PF) 4 MG/ML IV SOLN: 4.0000 mg | INTRAVENOUS | Status: DC | PRN

## 2022-12-31 MED ORDER — LACTATED RINGERS IV BOLUS
500.0000 mL | Freq: Once | INTRAVENOUS | Status: AC
  Administered 2022-12-31: 500 mL via INTRAVENOUS

## 2022-12-31 MED ORDER — DOXYCYCLINE HYCLATE 100 MG PO TABS
100.0000 mg | ORAL_TABLET | Freq: Two times a day (BID) | ORAL | Status: AC
  Administered 2022-12-31 – 2023-01-05 (×10): 100 mg via ORAL
  Filled 2022-12-31 (×11): qty 1

## 2022-12-31 MED ORDER — MORPHINE SULFATE (PF) 2 MG/ML IV SOLN: 2.0000 mg | INTRAVENOUS | Status: DC | PRN

## 2022-12-31 MED ORDER — SODIUM CHLORIDE 0.9 % IV SOLN
12.5000 mg | Freq: Four times a day (QID) | INTRAVENOUS | Status: DC | PRN
  Administered 2022-12-31 – 2023-01-05 (×8): 12.5 mg via INTRAVENOUS
  Filled 2022-12-31: qty 12.5
  Filled 2022-12-31: qty 0.5
  Filled 2022-12-31: qty 12.5
  Filled 2022-12-31: qty 0.5
  Filled 2022-12-31: qty 12.5
  Filled 2022-12-31: qty 0.5
  Filled 2022-12-31 (×2): qty 12.5

## 2022-12-31 NOTE — Progress Notes (Signed)
NAME:  Kylie Gros, MRN:  [id number], DOB:  1981-05-01, LOS: 2 ADMISSION DATE:  12/29/2022, CHIEF COMPLAINT:  Septic Shock   History of Present Illness:  42 y.o male with significant PMH of  Hypothyroidism, Asthma, incomplete low thoracic/lumbar Spinal cord injury, T7-T12, Spasticity 2/2 incomplete paraplegia, Neurogenic Bladder 2/2 SCI, chronic UTIs due to neurogenic bladder, chronic leg spasms, chronic pain syndrome, chronic constipation, Hypesthesia, Anxiety and Depression who presented to the ED with chief complaints of bilateral leg spasm, chills and rigor.   Patient reporting intermittent uncontrolled leg spasms without clear cause for his worsening spasticity. Patient states he has intermittent spasms at baseline but usually worsens if he has urinary tract infection.  He has chronic pain/anxiety on high doses of methadone, oxycodone, alprazolam, Baclofen, Gabapentin and tizanidine at home. Per patient's wife at the bedside on admission, patient also reported new onset neck pain and headache without vision changes, nausea, vomiting or any other focal neurological deficit.   ED Course: Initial vital signs showed HR of 137 beats/minute, BP 89/59  mm Hg, the RR 34 breaths/minute, and the oxygen saturation 93% on RA and a temperature of 98.49F (36.7C).    Pertinent Labs/Diagnostics Findings: Na+/ K+: 138/3.2 Glucose:103 BUN/Cr.:22/1.33 WBC:0.6 Plts: 121 PCT: 11.68 Lactic acid: 7.2    Patient given 30 cc/kg of fluids and started on broad-spectrum antibiotics Vanco/cefepime/Flagyl for sepsis secondary to UTI. Patient remained hypotensive despite IVF boluses therefore was started on Levophed. (Sepsis reassessment completed). PCCM consulted.  Pertinent  Medical History  Hypothyroidism, Asthma, incomplete low thoracic/lumbar Spinal cord injury, T7-T12, Spasticity 2/2 incomplete paraplegia  Neurogenic Bladder 2/2 SCI, chronic UTIs due to neurogenic bladder, chronic leg spasms, chronic  pain syndrome, chronic constipation, Hypesthesia, Anxiety and Depression  who presented to the ED with chief complaints of bilateral leg spasm, chills and rigor.  Significant Hospital Events: Including procedures, antibiotic start and stop dates in addition to other pertinent events   4/13: Admit to ICU with severe sepsis secondary to suspected UTI with shock requiring pressors 4/14: remained on pressors 4/15: weaning pressors down, remains hypotensive  Interim History / Subjective:  Reports increased spasticity and twitches  Objective   Blood pressure 103/68, pulse 71, temperature 98.5 F (36.9 C), resp. rate 11, height 6\' 5"  (1.956 m), weight 91.9 kg, SpO2 94 %.    FiO2 (%):  [0 %] 0 %   Intake/Output Summary (Last 24 hours) at 12/31/2022 1234 Last data filed at 12/31/2022 1205 Gross per 24 hour  Intake 902.17 ml  Output 3125 ml  Net -2222.83 ml   Filed Weights   12/30/22 0035 12/30/22 0500 12/31/22 0500  Weight: 91.5 kg 91.5 kg 91.9 kg    Examination: Physical Exam Constitutional:      General: He is not in acute distress.    Appearance: He is not ill-appearing.  HENT:     Head: Normocephalic.     Mouth/Throat:     Mouth: Mucous membranes are moist.  Cardiovascular:     Rate and Rhythm: Normal rate and regular rhythm.     Pulses: Normal pulses.     Heart sounds: Normal heart sounds.  Pulmonary:     Effort: Pulmonary effort is normal.     Breath sounds: Normal breath sounds. No wheezing.  Abdominal:     Palpations: Abdomen is soft.  Neurological:     Mental Status: He is alert and oriented to person, place, and time.     Motor: Weakness (lower extremities 0/5 strength) present.  Assessment & Plan:   Neurology #Spasticity #Paraplegia #Autonomic Dysreflexia  Increased leg spasms in the setting of acute infection. He is on multiple medications for spasticity and pain, including methadone, oxycodone, alprazolam, Tizanidine, and baclofen at home. He is also  on amphetamine-dextroamphetamine. Gabapentin has been added to patient's regimen this hospitalization, and home meds have been continued. Will consult neurology for input given complexity; doesn't appear that patient is followed outpatient with neurology.  -Switch amphetamine-dextroamphetamine to PRN -continue Baclofen 20 mg qid (home med dose) -continue methadone 20 mg q6hours -continue tizanidine 8 mg every 6 hours PRN -Morphine IV started PRN -Oxycodone PRN started -gabapentin PRN started -hold naproxen given AKI -neurology consult appreciated -consider consult to psychiatry  Cardiovascular #Septic Shock  Does have dysautonomia, patient septic secondary to UTI, currently on nor-epinephrine for vasopressor support. Continue with goal MAP of 65 mmHg, has received 30 cc/kg of IV fluids. Will augment regimen with midodrine.  -goal MAP 65 mmHg -continue nor-epinephrine, wean to goal MAP as able -midodrine 10 mg tid -continue IV antibiotics per ID section -check Qtc  Pulmonary #Community Acquired Pneumonia  Patient currently on room air. Chest CT suggests possible pneumonia, will treat with doxycycline for concern of atypical infection.  -start doxycycline  Gastrointestinal  Restart Diet  Renal #Neurogenic Bladder #AKI  Continue thrice dialy oxybutynin and Flovoxate, patient will require foley catheter while inpatient, transition to intermittent catheterization with improvement. Does have an AKI, on naproxen, will hold.  -monitor urine output -hold naproxen  Endocrine #Hypothyroidism  -continue home levothyroxine  Hem/Onc  Heparin subQ for DVT prophylaxis  ID #Septic Shock #Recurrent UTI  Presents with septic shock secondary to UTI, imaging does not suggest pyelonephritis or abscess, does have suggestion of pneumonia for which we will add doxycycline for atypical coverage. Patient currently on Cefepime, urine culture did grow enterobacter earlier in the month  outpatient.  -continue Cefepime -add doxycycline -follow up blood cultures   Best Practice (right click and "Reselect all SmartList Selections" daily)   Diet/type: Regular consistency (see orders) DVT prophylaxis: prophylactic heparin  GI prophylaxis: N/A Lines: Central line and yes and it is still needed Foley:  Yes, and it is still needed Code Status:  full code Last date of multidisciplinary goals of care discussion [12/31/2022]  Labs   CBC: Recent Labs  Lab 12/29/22 2006 12/30/22 0242 12/31/22 0512  WBC 0.6* 4.0 16.8*  NEUTROABS 0.5*  --  14.2*  HGB 13.1 12.7* 12.7*  HCT 41.2 39.7 37.9*  MCV 86.4 86.5 84.4  PLT 121* 99* 77*    Basic Metabolic Panel: Recent Labs  Lab 12/29/22 2006 12/30/22 0241 12/30/22 0242 12/30/22 1944 12/31/22 0512  NA 138  --  138 139 142  K 3.2*  --  3.7 4.0 4.3  CL 103  --  106 109 109  CO2 22  --  GLUCOSE 103*  --  91 88 127*  BUN 22*  --  25* 29* 26*  CREATININE 1.33*  --  1.61* 1.20 1.28*  CALCIUM 8.8*  --  8.5* 7.8* 8.3*  MG 1.4* 2.0  --   --  2.0  PHOS <1.0* 1.6*  --  3.7 3.0   GFR: Estimated Creatinine Clearance: 94.7 mL/min (A) (by C-G formula based on SCr of 1.28 mg/dL (H)). Recent Labs  Lab 12/29/22 2006 12/30/22 0215 12/30/22 0242 12/30/22 1858 12/31/22 0512  PROCALCITON 11.68  --   --   --   --   WBC 0.6*  --  4.0  --  16.8*  LATICACIDVEN 7.2* 5.1*  --  2.0*  --     Liver Function Tests: Recent Labs  Lab 12/30/22 0241  AST 390*  ALT 430*  ALKPHOS 84  BILITOT 2.3*  PROT 5.8*  ALBUMIN 3.4*   No results for input(s): "LIPASE", "AMYLASE" in the last 168 hours. No results for input(s): "AMMONIA" in the last 168 hours.  ABG No results found for: "PHART", "PCO2ART", "PO2ART", "HCO3", "TCO2", "ACIDBASEDEF", "O2SAT"   Coagulation Profile: No results for input(s): "INR", "PROTIME" in the last 168 hours.  Cardiac Enzymes: Recent Labs  Lab 12/29/22 2006  CKTOTAL 94    HbA1C: Hgb A1c MFr Bld   Date/Time Value Ref Range Status  12/29/2022 08:06 PM 5.1 4.8 - 5.6 % Final    Comment:    (NOTE) Pre diabetes:          5.7%-6.4%  Diabetes:              >6.4%  Glycemic control for   <7.0% adults with diabetes     CBG: Recent Labs  Lab 12/30/22 1952 12/30/22 2358 12/31/22 0345 12/31/22 0749 12/31/22 1116  GLUCAP 82 104* 107* 82 110*    Past Medical History:  He,  has a past medical history of Arthritis, Asthma, Chronic anxiety, Chronic depression, Chronic pain syndrome, Postoperative wound infection of right hip (2012), and Rheumatic fever.   Surgical History:   Past Surgical History:  Procedure Laterality Date   BACK SURGERY  2000   Fracture T9 and L3 with paraplegia     Social History:   reports that he has quit smoking. He has been exposed to tobacco smoke. He has never used smokeless tobacco. He reports current alcohol use. He reports that he does not use drugs.   Family History:  His family history includes Gout in his father; Hypertension in his father; Other in his mother. There is no history of Prostate cancer or Kidney cancer.   Allergies Allergies  Allergen Reactions   Morphine     psychosis   Azithromycin     throat closes   Lorazepam     extremely hostile   Meperidine    Morphine Sulfate     Psychotic symptoms     Home Medications  Prior to Admission medications   Medication Sig Start Date End Date Taking? Authorizing Provider  alprazolam Prudy Feeler) 2 MG tablet TAKE ONE TABLET EVERY SIX HOURS IF NEEDED 11/07/22  Yes Malva Limes, MD  oxyCODONE (ROXICODONE) 15 MG immediate release tablet Take one tablet every 4-5 hours as needed, no more than 5 in a day 12/19/22  Yes Fisher, Demetrios Isaacs, MD  albuterol (VENTOLIN HFA) 108 (90 Base) MCG/ACT inhaler Inhale 2 puffs into the lungs every 6 (six) hours as needed for wheezing or shortness of breath. 10/03/21   Malva Limes, MD  amphetamine-dextroamphetamine (ADDERALL) 15 MG tablet TAKE ONE (1) TABLET  BY MOUTH TWO TIMES PER DAY 12/07/22   Malva Limes, MD  azelastine (OPTIVAR) 0.05 % ophthalmic solution Place 1 drop into both eyes 2 (two) times daily. 03/19/22   Ostwalt, Edmon Crape, PA-C  baclofen (LIORESAL) 20 MG tablet TAKE ONE TABLET FOUR TIMES DAILY 01/26/22   Malva Limes, MD  diphenhydrAMINE (BENADRYL) 50 MG capsule Take 1 capsule 1 hour before procedure 05/22/22   Malva Limes, MD  docusate sodium (COLACE) 250 MG capsule Take 1 capsule (250 mg total) by mouth 4 (four) times daily as needed. 08/10/15  Malva Limes, MD  dronabinol (MARINOL) 10 MG capsule Take 1 capsule (10 mg total) by mouth every 6 (six) hours as needed. 04/18/16   Malva Limes, MD  fesoterodine (TOVIAZ) 8 MG TB24 tablet TAKE 1 TABLET BY MOUTH DAILY 05/27/22   Malva Limes, MD  flavoxATE (URISPAS) 100 MG tablet Take 1 tablet (100 mg total) by mouth 3 (three) times daily as needed for bladder spasms. 08/22/20   Chrismon, Jodell Cipro, PA-C  fluticasone (FLONASE) 50 MCG/ACT nasal spray Place 2 sprays into both nostrils daily. 07/04/21   Muthersbaugh, Dahlia Client, PA-C  gabapentin (NEURONTIN) 800 MG tablet Take 1 tablet (800 mg total) by mouth 4 (four) times daily. 11/30/15   Malva Limes, MD  ketoconazole (NIZORAL) 2 % cream Apply 1 Application topically 2 (two) times daily. 05/22/22   Malva Limes, MD  levothyroxine (SYNTHROID) 88 MCG tablet TAKE ONE TABLET BY MOUTH EVERY DAY 12/11/22   Malva Limes, MD  lidocaine (LMX) 4 % cream Apply 1 Application topically 3 (three) times daily. 04/18/22   Debera Lat, PA-C  methadone (DOLOPHINE) 10 MG tablet TAKE 2 TABLETS BY MOUTH EVERY 6 HOURS 12/07/22   Malva Limes, MD  Mineral Oil OIL daily.    [provider]  naproxen sodium (ANAPROX) 550 MG tablet TAKE 1 TABLET BY MOUTH 2 TIMES DAILY WITH A MEAL. 11/07/22   Malva Limes, MD  omeprazole (PRILOSEC) 20 MG capsule TAKE 1 CAPSULE BY MOUTH ONCE DAILY 07/19/22   Malva Limes, MD  oxybutynin (DITROPAN) 5 MG  tablet Take 5 mg by mouth daily as needed. 11/27/21   [provider]  phenazopyridine (PYRIDIUM) 200 MG tablet Take 1 tablet (200 mg total) by mouth 3 (three) times daily. 09/17/19   Chrismon, Jodell Cipro, PA-C  Plecanatide (TRULANCE) 3 MG TABS Take 1 tablet (3 mg total) by mouth daily. 12/17/22   Malva Limes, MD  polyethylene glycol powder (QC NATURA-LAX) 17 GM/SCOOP powder MIX 1 CAPFUL IN 8 0Z OF WATER OR JUICE AND DRINK ONCE A DAY 12/25/21   Malva Limes, MD  promethazine (PHENERGAN) 25 MG suppository PLACE 1 SUPPOSITORY RECTALLY EVERY 6 HOURS AS NEEDED FOR NAUSEA OR VOMITING 11/07/22   Malva Limes, MD  promethazine (PHENERGAN) 25 MG tablet TAKE 1 TABLET BY MOUTH EVERY 4 HOURS AS NEEDED FOR NAUSEA AND VOMITING 12/12/22   Malva Limes, MD  sildenafil (REVATIO) 20 MG tablet Take 3 to 5 tablets two hours before intercouse on an empty stomach.  Do not take with nitrates. 06/26/22   Malva Limes, MD  silver sulfADIAZINE (SSD) 1 % cream APPLY TO AFFECTED AREAS TOPICALLY TWICE DAILY 03/28/21   Malva Limes, MD  tiZANidine (ZANAFLEX) 4 MG tablet TAKE 2 TABLETS BY MOUTH 4 TIMES DAILY 07/20/22   Malva Limes, MD     Critical care time: 40 minutes    Raechel Chute, MD [facility] 12/31/2022 1:50 PM

## 2022-12-31 NOTE — Plan of Care (Signed)
Pt weaned off levo this afternoon, Pt will drooped when pt goes to sleep and will come up to WNL when awake. Midodrine frequency change to every 6 hrs, Methadone held for 1500 due increase in drowsiness. Prn pain medication given as needed. Pt slowly progressing towards goal for transfer out of ICU. Wife at bedside and updated.

## 2022-12-31 NOTE — Progress Notes (Signed)
PHARMACY CONSULT NOTE  Pharmacy Consult for Electrolyte Monitoring and Replacement   Recent Labs: Potassium (mmol/L)  Date Value  12/31/2022 4.3  06/28/2014 3.6   Magnesium (mg/dL)  Date Value  26/20/3559 2.0   Calcium (mg/dL)  Date Value  74/16/3845 8.3 (L)   Calcium, Total (mg/dL)  Date Value  36/46/8032 8.3 (L)   Albumin (g/dL)  Date Value  09/09/8249 3.4 (L)  04/20/2022 4.6  06/25/2014 4.0   Phosphorus (mg/dL)  Date Value  03/70/4888 3.0   Sodium (mmol/L)  Date Value  12/31/2022 142  04/20/2022 139  06/28/2014 139   Assessment: 42 y.o male with significant PMH of  Hypothyroidism, Asthma, incomplete low thoracic/lumbar Spinal cord injury, T7-T12, Spasticity 2/2 incomplete paraplegia  Neurogenic Bladder 2/2 SCI, chronic UTIs due to neurogenic bladder, chronic leg spasms, chronic pain syndrome, chronic constipation, Hypesthesia, Anxiety and Depression  who presented to the ED with chief complaints of bilateral leg spasm, chills and rigor.   Goal of Therapy:  Electrolytes within normal limits   Plan:  --No electrolyte replacement indicated at this time --Patient care transferred from PCCM to [facility]. Will discontinue electrolyte consult at this time. Defer further ordering of labs and electrolyte replacement to primary team --Pharmacy will continue to follow along peripherally  Tressie Ellis 12/31/2022 8:03 AM

## 2022-12-31 NOTE — Progress Notes (Signed)
   12/31/22 1300  Spiritual Encounters  Type of Visit Initial  Care provided to: Patient  Referral source Chaplain assessment  Reason for visit Routine spiritual support  OnCall Visit Yes  Spiritual Framework  Presenting Themes Meaning/purpose/sources of inspiration;Courage hope and growth;Rituals and practive  Community/Connection Family  Patient Stress Factors Health changes;Major life changes  Family Stress Factors None identified  Interventions  Spiritual Care Interventions Made Established relationship of care and support;Compassionate presence;Reflective listening;Prayer;Encouragement  Intervention Outcomes  Outcomes Reduced anxiety  Spiritual Care Plan  Spiritual Care Issues Still Outstanding Chaplain will continue to follow   Patient was dealing with some anxiety and stress being in the hospital in ICU. Patient asked for me to pray for him while his wife was on the phone at home. Talk to patient briefly about is anxiety of being in the hospital ICU area. He talk about some of his struggles physical with being paralyze from the wist down. Advise patient if he need someone to talk to Chaplain call be available to speak with at times.

## 2022-12-31 NOTE — Progress Notes (Signed)
Pt Continued to take BP off, Pt has been educated on not to take medical equipments off. He stated understanding and will  continue to take it off anyway.Frequency of BP changed per patient request.

## 2023-01-01 DIAGNOSIS — A419 Sepsis, unspecified organism: Secondary | ICD-10-CM | POA: Diagnosis not present

## 2023-01-01 DIAGNOSIS — N390 Urinary tract infection, site not specified: Secondary | ICD-10-CM | POA: Diagnosis not present

## 2023-01-01 LAB — CBC
HCT: 34.9 % — ABNORMAL LOW (ref 39.0–52.0)
Hemoglobin: 11.2 g/dL — ABNORMAL LOW (ref 13.0–17.0)
MCH: 27.9 pg (ref 26.0–34.0)
MCHC: 32.1 g/dL (ref 30.0–36.0)
MCV: 86.8 fL (ref 80.0–100.0)
Platelets: 58 10*3/uL — ABNORMAL LOW (ref 150–400)
RBC: 4.02 MIL/uL — ABNORMAL LOW (ref 4.22–5.81)
RDW: 14.9 % (ref 11.5–15.5)
WBC: 12 10*3/uL — ABNORMAL HIGH (ref 4.0–10.5)
nRBC: 0 % (ref 0.0–0.2)

## 2023-01-01 LAB — COMPREHENSIVE METABOLIC PANEL
ALT: 199 U/L — ABNORMAL HIGH (ref 0–44)
AST: 98 U/L — ABNORMAL HIGH (ref 15–41)
Albumin: 3.2 g/dL — ABNORMAL LOW (ref 3.5–5.0)
Alkaline Phosphatase: 101 U/L (ref 38–126)
Anion gap: 3 — ABNORMAL LOW (ref 5–15)
BUN: 26 mg/dL — ABNORMAL HIGH (ref 6–20)
CO2: 31 mmol/L (ref 22–32)
Calcium: 8.4 mg/dL — ABNORMAL LOW (ref 8.9–10.3)
Chloride: 110 mmol/L (ref 98–111)
Creatinine, Ser: 0.84 mg/dL (ref 0.61–1.24)
GFR, Estimated: >60 mL/min (ref >60)
Glucose, Bld: 84 mg/dL (ref 70–99)
Potassium: 4.5 mmol/L (ref 3.5–5.1)
Sodium: 144 mmol/L (ref 135–145)
Total Bilirubin: 1.7 mg/dL — ABNORMAL HIGH (ref 0.3–1.2)
Total Protein: 5.5 g/dL — ABNORMAL LOW (ref 6.5–8.1)

## 2023-01-01 LAB — GLUCOSE, CAPILLARY
Glucose-Capillary: 100 mg/dL — ABNORMAL HIGH (ref 70–99)
Glucose-Capillary: 102 mg/dL — ABNORMAL HIGH (ref 70–99)
Glucose-Capillary: 102 mg/dL — ABNORMAL HIGH (ref 70–99)
Glucose-Capillary: 105 mg/dL — ABNORMAL HIGH (ref 70–99)
Glucose-Capillary: 42 mg/dL — CL (ref 70–99)
Glucose-Capillary: 72 mg/dL (ref 70–99)
Glucose-Capillary: 77 mg/dL (ref 70–99)
Glucose-Capillary: 90 mg/dL (ref 70–99)
Glucose-Capillary: 91 mg/dL (ref 70–99)

## 2023-01-01 LAB — PHOSPHORUS: Phosphorus: 2.6 mg/dL (ref 2.5–4.6)

## 2023-01-01 LAB — MAGNESIUM: Magnesium: 2.1 mg/dL (ref 1.7–2.4)

## 2023-01-01 MED ORDER — ENOXAPARIN SODIUM 40 MG/0.4ML IJ SOSY
40.0000 mg | PREFILLED_SYRINGE | INTRAMUSCULAR | Status: DC
  Administered 2023-01-01: 40 mg via SUBCUTANEOUS
  Filled 2023-01-01: qty 0.4

## 2023-01-01 MED ORDER — DEXTROSE 50 % IV SOLN
INTRAVENOUS | Status: AC
  Administered 2023-01-01: 50 mL
  Filled 2023-01-01: qty 50

## 2023-01-01 MED ORDER — SODIUM CHLORIDE 0.9 % IV SOLN: INTRAVENOUS | Status: DC

## 2023-01-01 NOTE — Progress Notes (Signed)
Progress Note   Patient: Justin [name] ZOX:[id number] DOB: 1981-05-21 DOA: 12/29/2022     3 DOS: the patient was seen and examined on 01/01/2023    Subjective:  Patient seen and examined at bedside this morning Denies chest pain abdominal pain nausea vomiting He would like his bed to be changed so that he is able to turn easily in bed and reduce pressure ulcers  Brief hospital course: 42 y.o male with significant PMH of  Hypothyroidism, Asthma, incomplete low thoracic/lumbar Spinal cord injury, T7-T12, Spasticity 2/2 incomplete paraplegia, Neurogenic Bladder 2/2 SCI, chronic UTIs due to neurogenic bladder, chronic leg spasms, chronic pain syndrome, chronic constipation, Hypesthesia, Anxiety and Depression who presented to the ED with chief complaints of bilateral leg spasm, chills and rigor. Patient reporting intermittent uncontrolled leg spasms without clear cause for his worsening spasticity. He has chronic pain/anxiety on high doses of methadone, oxycodone, alprazolam, Baclofen, Gabapentin and tizanidine at home.  Was initially admitted to the ICU for septic shock due to UTI currently off pressors and transferred to [facility].  Assessment and Plan:   #Spasticity #Paraplegia #Autonomic Dysreflexia Patient presented with increased leg spasms in the setting of acute infection. He is on multiple medications for spasticity and pain, including methadone, oxycodone, alprazolam, Tizanidine, and baclofen at home. He is also on amphetamine-dextroamphetamine. Gabapentin has been added to patient's regimen this hospitalization, and home meds have been continued.   Plan -Switched amphetamine-dextroamphetamine to PRN -continue Baclofen 20 mg qid (home med dose) -continue methadone 20 mg q6hours -continue tizanidine 8 mg every 6 hours PRN -Morphine IV started PRN -Oxycodone PRN started -gabapentin PRN started -Continue naproxen given AKI -Patient will need outpatient neurology follow-up     #Septic Shock with endorgan dysfunction-present on admission On admission patient met SIRS criteria: Heart Rate 137 beats/minute, Respiratory Rate 34 and was hypotensive requiring vasopressor support.  Patient was as low as 77/41. Does have dysautonomia, patient septic secondary to UTI and community-acquired pneumonia Chest CT suggests possible pneumonia Patient was on Levophed in the ICU which have now been weaned off -midodrine 10 mg tid -Continue current antibiotics: Currently on doxycycline and cefepime Doxycycline was initiated in the ICU for atypical pneumonia Continue to follow-up on culture results  #Neurogenic Bladder #AKI Continue thrice dialy oxybutynin and Flovoxate, patient will require foley catheter while inpatient, transition to intermittent catheterization with improvement.  Discontinued did not present Continue to monitor renal function Avoid nephrotoxic medications  #Hypothyroidism  -continue home levothyroxine   Acute transaminitis likely secondary to septic shock -Improving Continue to monitor LFTs   Heparin subQ for DVT prophylaxis         Physical Exam: Vitals:   01/01/23 1030 01/01/23 1200 01/01/23 1215 01/01/23 1510  BP:   (!) 138/121 108/60  Pulse: (!) 114     Resp: 18  (!) 33 16  Temp:  98.6 F (37 C)  98.6 F (37 C)  TempSrc:  Oral    SpO2: 90%  94%   Weight:      Height:       Physical Exam Constitutional:      General: He is not in acute distress.    Appearance: He is not ill-appearing.  HENT:     Head: Normocephalic.     Mouth/Throat:     Mouth: Mucous membranes are moist.  Cardiovascular:     Rate and Rhythm: Normal rate and regular rhythm.     Pulses: Normal pulses.     Heart sounds: Normal heart sounds.  Pulmonary:     Effort: Pulmonary effort is normal.     Breath sounds: Normal breath sounds. No wheezing.  Abdominal:     Palpations: Abdomen is soft.  Neurological:     Mental Status: He is alert and oriented to  person, place, and time.     Motor: Paraplegic  Data Reviewed: I have reviewed patient's laboratory results that showed an episode of hypoglycemia of 42, sodium 144 potassium 4.5 as well as elevated liver enzymes which are improving  Family Communication: None present at bedside  Disposition: Status is: Inpatient Patient still continues to meet inpatient criteria given that he is still requiring IV antibiotics and just recently weaned off vasopressor support.  Time spent: 45 minutes  Author: Loyce Dys, MD 01/01/2023 3:18 PM  For on call review [email or web address].

## 2023-01-02 DIAGNOSIS — A419 Sepsis, unspecified organism: Secondary | ICD-10-CM | POA: Diagnosis not present

## 2023-01-02 DIAGNOSIS — N390 Urinary tract infection, site not specified: Secondary | ICD-10-CM | POA: Diagnosis not present

## 2023-01-02 LAB — CBC WITH DIFFERENTIAL/PLATELET
Abs Immature Granulocytes: 0.02 10*3/uL (ref 0.00–0.07)
Basophils Absolute: 0 10*3/uL (ref 0.0–0.1)
Basophils Relative: 0 %
Eosinophils Absolute: 0.1 10*3/uL (ref 0.0–0.5)
Eosinophils Relative: 1 %
HCT: 33.3 % — ABNORMAL LOW (ref 39.0–52.0)
Hemoglobin: 11 g/dL — ABNORMAL LOW (ref 13.0–17.0)
Immature Granulocytes: 0 %
Lymphocytes Relative: 14 %
Lymphs Abs: 1.1 10*3/uL (ref 0.7–4.0)
MCH: 28.6 pg (ref 26.0–34.0)
MCHC: 33 g/dL (ref 30.0–36.0)
MCV: 86.7 fL (ref 80.0–100.0)
Monocytes Absolute: 0.3 10*3/uL (ref 0.1–1.0)
Monocytes Relative: 4 %
Neutro Abs: 6.3 10*3/uL (ref 1.7–7.7)
Neutrophils Relative %: 81 %
Platelets: 49 10*3/uL — ABNORMAL LOW (ref 150–400)
RBC: 3.84 MIL/uL — ABNORMAL LOW (ref 4.22–5.81)
RDW: 15 % (ref 11.5–15.5)
WBC: 7.8 10*3/uL (ref 4.0–10.5)
nRBC: 0 % (ref 0.0–0.2)

## 2023-01-02 LAB — COMPREHENSIVE METABOLIC PANEL
ALT: 144 U/L — ABNORMAL HIGH (ref 0–44)
AST: 60 U/L — ABNORMAL HIGH (ref 15–41)
Albumin: 3 g/dL — ABNORMAL LOW (ref 3.5–5.0)
Alkaline Phosphatase: 157 U/L — ABNORMAL HIGH (ref 38–126)
Anion gap: 6 (ref 5–15)
BUN: 18 mg/dL (ref 6–20)
CO2: 25 mmol/L (ref 22–32)
Calcium: 8.1 mg/dL — ABNORMAL LOW (ref 8.9–10.3)
Chloride: 111 mmol/L (ref 98–111)
Creatinine, Ser: 0.66 mg/dL (ref 0.61–1.24)
GFR, Estimated: >60 mL/min (ref >60)
Glucose, Bld: 83 mg/dL (ref 70–99)
Potassium: 4.2 mmol/L (ref 3.5–5.1)
Sodium: 142 mmol/L (ref 135–145)
Total Bilirubin: 1.5 mg/dL — ABNORMAL HIGH (ref 0.3–1.2)
Total Protein: 5.1 g/dL — ABNORMAL LOW (ref 6.5–8.1)

## 2023-01-02 LAB — GLUCOSE, CAPILLARY
Glucose-Capillary: 65 mg/dL — ABNORMAL LOW (ref 70–99)
Glucose-Capillary: 78 mg/dL (ref 70–99)
Glucose-Capillary: 82 mg/dL (ref 70–99)
Glucose-Capillary: 67 mg/dL — ABNORMAL LOW (ref 70–99)
Glucose-Capillary: 54 mg/dL — ABNORMAL LOW (ref 70–99)

## 2023-01-02 MED ORDER — SODIUM CHLORIDE 0.9 % IV SOLN: INTRAVENOUS | Status: DC

## 2023-01-02 MED ORDER — DEXTROSE 50 % IV SOLN: 25.0000 g | INTRAVENOUS | Status: DC | PRN

## 2023-01-02 MED ORDER — AMOXICILLIN-POT CLAVULANATE 875-125 MG PO TABS
1.0000 | ORAL_TABLET | Freq: Two times a day (BID) | ORAL | Status: AC
  Administered 2023-01-02 – 2023-01-05 (×6): 1 via ORAL
  Filled 2023-01-02 (×7): qty 1

## 2023-01-02 MED ORDER — DEXTROSE 50 % IV SOLN
12.5000 g | INTRAVENOUS | Status: DC | PRN
  Filled 2023-01-02: qty 50

## 2023-01-02 MED ORDER — DEXTROSE 50 % IV SOLN
INTRAVENOUS | Status: AC
  Administered 2023-01-02: 25 g via INTRAVENOUS
  Filled 2023-01-02: qty 50

## 2023-01-02 MED ORDER — MIDODRINE HCL 5 MG PO TABS
10.0000 mg | ORAL_TABLET | Freq: Three times a day (TID) | ORAL | Status: DC
  Administered 2023-01-02: 10 mg via ORAL
  Filled 2023-01-02: qty 2

## 2023-01-02 MED ORDER — PROMETHAZINE HCL 25 MG RE SUPP: 12.5000 mg | Freq: Once | RECTAL | Status: DC

## 2023-01-02 MED ORDER — PROMETHAZINE HCL 25 MG RE SUPP
25.0000 mg | Freq: Once | RECTAL | Status: AC
  Administered 2023-01-02: 25 mg via RECTAL
  Filled 2023-01-02 (×2): qty 1

## 2023-01-02 MED ORDER — IBUPROFEN 400 MG PO TABS
400.0000 mg | ORAL_TABLET | Freq: Three times a day (TID) | ORAL | Status: DC | PRN
  Administered 2023-01-02 – 2023-01-05 (×8): 400 mg via ORAL
  Filled 2023-01-02 (×8): qty 1

## 2023-01-02 NOTE — Hospital Course (Signed)
42 y.o male with significant PMH of  Hypothyroidism, Asthma, incomplete low thoracic/lumbar Spinal cord injury, T7-T12, Spasticity 2/2 incomplete paraplegia, Neurogenic Bladder 2/2 SCI, chronic UTIs due to neurogenic bladder, chronic leg spasms, chronic pain syndrome, chronic constipation, Hypesthesia, Anxiety and Depression who presented to the ED with chief complaints of bilateral leg spasm, chills and rigor. Patient reporting intermittent uncontrolled leg spasms without clear cause for his worsening spasticity. He has chronic pain/anxiety on high doses of methadone, oxycodone, alprazolam, Baclofen, Gabapentin and tizanidine at home.    Pt was initially admitted to the ICU on 12/29/22 for septic shock due to UTI currently off pressors and transferred to [facility] 4/16.

## 2023-01-02 NOTE — Progress Notes (Signed)
Telephone report called to Burna Cash, RN .  Patient taken via bed to room 134 by Thayer Ohm, NT

## 2023-01-02 NOTE — Progress Notes (Signed)
Progress Note   Patient: Justin [name] [id number] DOB: 1980-10-04 DOA: 12/29/2022     4 DOS: the patient was seen and examined on 01/02/2023   Brief hospital course: 42 y.o male with significant PMH of  Hypothyroidism, Asthma, incomplete low thoracic/lumbar Spinal cord injury, T7-T12, Spasticity 2/2 incomplete paraplegia, Neurogenic Bladder 2/2 SCI, chronic UTIs due to neurogenic bladder, chronic leg spasms, chronic pain syndrome, chronic constipation, Hypesthesia, Anxiety and Depression who presented to the ED with chief complaints of bilateral leg spasm, chills and rigor. Patient reporting intermittent uncontrolled leg spasms without clear cause for his worsening spasticity. He has chronic pain/anxiety on high doses of methadone, oxycodone, alprazolam, Baclofen, Gabapentin and tizanidine at home.    Pt was initially admitted to the ICU on 12/29/22 for septic shock due to UTI currently off pressors and transferred to [facility] 4/16.  Assessment and Plan:  #Septic Shock with endorgan dysfunction-present on admission On admission patient met SIRS criteria: HR 137, RR 34 and hypotensive requiring vasopressor support.   BP was as low as 77/41. Does have dysautonomia, patient septic secondary to UTI and community-acquired pneumonia Chest CT suggests possible pneumonia Required Levophed in the ICU, weaned off and transitioned to midodrine. --Start weaning down midodrine and taper off as BP allows --Continue current antibiotics: Currently on doxycycline and cefepime --Continue doxycycline (initiated in the ICU for atypical pneumonia) --follow results   #Spasticity #Paraplegia #Autonomic Dysreflexia Patient presented with increased leg spasms in the setting of acute infection. He is on multiple medications for spasticity and pain, including methadone, oxycodone, alprazolam, Tizanidine, and baclofen at home. He is also on amphetamine-dextroamphetamine. Gabapentin has been added to  patient's regimen this hospitalization, and home meds have been continued.    Plan -Switched amphetamine-dextroamphetamine to PRN -continue Baclofen 20 mg qid (home med dose) -continue methadone 20 mg q6hours -continue tizanidine 8 mg every 6 hours PRN -Morphine IV started PRN -Oxycodone PRN started -gabapentin PRN started -Continue naproxen given AKI -Patient will need outpatient neurology follow-up     #Neurogenic Bladder #AKI --Continue oxybutynin and Flovoxat --D/C Foley and transition to intermittent self-cath --Continue to monitor renal function --Avoid nephrotoxic medications   #Hypothyroidism  --Continue levothyroxine   Acute transaminitis likely secondary to septic shock -Improving --Monitor LFTs      Subjective: Pt seen in ICU this AM.  Having nausea and vomiting, not relieved with Zofran.  Not able to eat.  Requesting his Advil which he takes at home to help with his autonomic symptoms including fever and pain.  Nausea improved with add'l medication, able to eat crackers.  No other acute complaints.   Physical Exam: Vitals:   01/02/23 1100 01/02/23 1200 01/02/23 1300 01/02/23 1636  BP:    119/71  Pulse: 83 (!) 117  69  Resp:    18  Temp:   100.3 F (37.9 C) 98.1 F (36.7 C)  TempSrc:   Axillary   SpO2: 92% 95%  95%  Weight:      Height:       General exam: awake, alert, no acute distress HEENT: moist mucus membranes, hearing grossly normal  Respiratory system: CTAB, no wheezes, rales or rhonchi, normal respiratory effort. Cardiovascular system: normal S1/S2, RRR, no JVD, murmurs, rubs, gallops, +lower extremity and pedal edema.   Gastrointestinal system: soft, NT, ND, no HSM felt, +bowel sounds. Central nervous system: A&O x4. B/l lower extremity paralysis, normal speech Extremities: b/l feet in offloading boots, BLE   pedal edema Skin: dry, intact, normal temperature Psychiatry:  normal mood, congruent affect, judgement and insight appear  normal   Data Reviewed:  Notable labs ---  Ca 8.1, Alk phos 157, albumin 3.0, AST 60, ALT 144, Tprotein 5.1, Tbili 1.5, Hbg 11.0, platelets 49k down from 58k yesterday  Family Communication: none at bedside, will attempt to call as time allows.  Disposition: Status is: Inpatient Remains inpatient appropriate because: remains on IV therapies as outlined, N/V not tolerating PO intake   Planned Discharge Destination: Home    Time spent: 46 minutes  Author: Pennie Banter, DO 01/02/2023 5:46 PM  For on call review [email or web address].

## 2023-01-03 ENCOUNTER — Inpatient Hospital Stay: Payer: Medicare HMO

## 2023-01-03 DIAGNOSIS — A419 Sepsis, unspecified organism: Secondary | ICD-10-CM | POA: Diagnosis not present

## 2023-01-03 DIAGNOSIS — N390 Urinary tract infection, site not specified: Secondary | ICD-10-CM | POA: Diagnosis not present

## 2023-01-03 DIAGNOSIS — D696 Thrombocytopenia, unspecified: Secondary | ICD-10-CM | POA: Diagnosis present

## 2023-01-03 DIAGNOSIS — R7989 Other specified abnormal findings of blood chemistry: Secondary | ICD-10-CM | POA: Insufficient documentation

## 2023-01-03 DIAGNOSIS — L8990 Pressure ulcer of unspecified site, unspecified stage: Secondary | ICD-10-CM | POA: Diagnosis present

## 2023-01-03 DIAGNOSIS — E162 Hypoglycemia, unspecified: Secondary | ICD-10-CM | POA: Diagnosis present

## 2023-01-03 LAB — CBC
HCT: 33.8 % — ABNORMAL LOW (ref 39.0–52.0)
Hemoglobin: 10.9 g/dL — ABNORMAL LOW (ref 13.0–17.0)
MCH: 27.6 pg (ref 26.0–34.0)
MCHC: 32.2 g/dL (ref 30.0–36.0)
MCV: 85.6 fL (ref 80.0–100.0)
Platelets: 59 10*3/uL — ABNORMAL LOW (ref 150–400)
RBC: 3.95 MIL/uL — ABNORMAL LOW (ref 4.22–5.81)
RDW: 14.7 % (ref 11.5–15.5)
WBC: 4.3 10*3/uL (ref 4.0–10.5)
nRBC: 0 % (ref 0.0–0.2)

## 2023-01-03 LAB — COMPREHENSIVE METABOLIC PANEL
ALT: 122 U/L — ABNORMAL HIGH (ref 0–44)
AST: 54 U/L — ABNORMAL HIGH (ref 15–41)
Albumin: 3.2 g/dL — ABNORMAL LOW (ref 3.5–5.0)
Alkaline Phosphatase: 164 U/L — ABNORMAL HIGH (ref 38–126)
Anion gap: 7 (ref 5–15)
BUN: 12 mg/dL (ref 6–20)
CO2: 26 mmol/L (ref 22–32)
Calcium: 8.2 mg/dL — ABNORMAL LOW (ref 8.9–10.3)
Chloride: 108 mmol/L (ref 98–111)
Creatinine, Ser: 0.68 mg/dL (ref 0.61–1.24)
GFR, Estimated: >60 mL/min (ref >60)
Glucose, Bld: 75 mg/dL (ref 70–99)
Potassium: 3.8 mmol/L (ref 3.5–5.1)
Sodium: 141 mmol/L (ref 135–145)
Total Bilirubin: 1.7 mg/dL — ABNORMAL HIGH (ref 0.3–1.2)
Total Protein: 5.6 g/dL — ABNORMAL LOW (ref 6.5–8.1)

## 2023-01-03 LAB — GLUCOSE, CAPILLARY
Glucose-Capillary: 103 mg/dL — ABNORMAL HIGH (ref 70–99)
Glucose-Capillary: 108 mg/dL — ABNORMAL HIGH (ref 70–99)
Glucose-Capillary: 63 mg/dL — ABNORMAL LOW (ref 70–99)
Glucose-Capillary: 65 mg/dL — ABNORMAL LOW (ref 70–99)
Glucose-Capillary: 75 mg/dL (ref 70–99)
Glucose-Capillary: 78 mg/dL (ref 70–99)

## 2023-01-03 LAB — MAGNESIUM: Magnesium: 1.7 mg/dL (ref 1.7–2.4)

## 2023-01-03 MED ORDER — KETOROLAC TROMETHAMINE 30 MG/ML IJ SOLN: 30.0000 mg | Freq: Once | INTRAMUSCULAR | Status: DC

## 2023-01-03 MED ORDER — FESOTERODINE FUMARATE ER 4 MG PO TB24
8.0000 mg | ORAL_TABLET | Freq: Every day | ORAL | Status: DC
  Administered 2023-01-03 – 2023-01-07 (×4): 8 mg via ORAL
  Filled 2023-01-03: qty 2
  Filled 2023-01-03: qty 1
  Filled 2023-01-03: qty 2
  Filled 2023-01-03 (×3): qty 1

## 2023-01-03 MED ORDER — ALPRAZOLAM 1 MG PO TABS
1.0000 mg | ORAL_TABLET | Freq: Once | ORAL | Status: AC
  Administered 2023-01-03: 1 mg via ORAL
  Filled 2023-01-03: qty 1

## 2023-01-03 MED ORDER — ALPRAZOLAM 1 MG PO TABS
2.0000 mg | ORAL_TABLET | Freq: Four times a day (QID) | ORAL | Status: DC | PRN
  Administered 2023-01-03 – 2023-01-07 (×10): 2 mg via ORAL
  Filled 2023-01-03 (×10): qty 2

## 2023-01-03 MED ORDER — MINERAL OIL PO OIL
TOPICAL_OIL | Freq: Every day | ORAL | Status: DC | PRN
  Administered 2023-01-05 – 2023-01-07 (×2): 1 mL via ORAL
  Filled 2023-01-03: qty 473
  Filled 2023-01-03: qty 30
  Filled 2023-01-03 (×2): qty 473
  Filled 2023-01-03: qty 30

## 2023-01-03 MED ORDER — PANTOPRAZOLE SODIUM 40 MG PO TBEC
40.0000 mg | DELAYED_RELEASE_TABLET | Freq: Every day | ORAL | Status: DC
  Administered 2023-01-03 – 2023-01-07 (×5): 40 mg via ORAL
  Filled 2023-01-03 (×5): qty 1

## 2023-01-03 MED ORDER — DEXTROSE-NACL 5-0.9 % IV SOLN: INTRAVENOUS | Status: DC

## 2023-01-03 MED ORDER — AMPHETAMINE-DEXTROAMPHETAMINE 10 MG PO TABS
15.0000 mg | ORAL_TABLET | Freq: Two times a day (BID) | ORAL | Status: DC
  Administered 2023-01-03: 10 mg via ORAL
  Filled 2023-01-03 (×4): qty 2

## 2023-01-03 MED ORDER — MIDODRINE HCL 5 MG PO TABS
5.0000 mg | ORAL_TABLET | Freq: Three times a day (TID) | ORAL | Status: DC
  Administered 2023-01-03 – 2023-01-04 (×4): 5 mg via ORAL
  Filled 2023-01-03 (×4): qty 1

## 2023-01-03 MED ORDER — DOCUSATE SODIUM 100 MG PO CAPS
200.0000 mg | ORAL_CAPSULE | Freq: Four times a day (QID) | ORAL | Status: DC | PRN
  Administered 2023-01-05 – 2023-01-07 (×2): 200 mg via ORAL
  Filled 2023-01-03 (×2): qty 2

## 2023-01-03 MED ORDER — DIPHENHYDRAMINE HCL 50 MG/ML IJ SOLN
25.0000 mg | Freq: Once | INTRAMUSCULAR | Status: AC
  Administered 2023-01-03: 25 mg via INTRAVENOUS
  Filled 2023-01-03: qty 1

## 2023-01-03 MED ORDER — FENTANYL CITRATE PF 50 MCG/ML IJ SOSY
12.5000 ug | PREFILLED_SYRINGE | Freq: Once | INTRAMUSCULAR | Status: AC
  Administered 2023-01-04: 12.5 ug via INTRAVENOUS
  Filled 2023-01-03: qty 1

## 2023-01-03 NOTE — TOC Initial Note (Signed)
Transition of Care [facility]) - Initial/Assessment Note    Patient Details  Name: Justin [name] MRN: [id number] Date of Birth: 29-Sep-1980  Transition of Care [facility]) CM/SW Contact:    Garret Reddish, RN Phone Number: 01/03/2023, 3:47 PM  Clinical Narrative:    Chart reviewed.  Noted that patient was admitted with Sepsis due to UTI.  Noted that patient is being treated for PNA and UTI.  Patient is currently on Doxycycline and IV Cefepime.    I have meet with patient at bedside.  He reports that he lives at home with wife.  He reports that prior to admission he was able to get around with his wheelchair.  He reports that he is able to drive as long as he does not have spasms.  His wife assist with taking him to appointments when he is not able to drive.  Patient reports that he does in and out catheters and has the supplies to perform the in and out catheters at home.  Patient reports that he has a manual wheelchair, and a specialized air mattress that helps with pressure ulcers.  Patient's PCP is Dr. [name].  Patient uses [facility].  Patient reports that most medications are affordable.  He reports their is on medication that he has been working with the providers office and insurance company to receive.  He reports that he has to try other options first before the insurance will approve.  He is also looking at following up with a pain management clinic at [facility] and [place]. Currently patient has no TOC needs.   Patient plans to return home on discharge with his wife.  Patient reports that his wife will transport him home on discharge.    TOC will continue to follow for discharge needs.              Expected Discharge Plan:  (Pending Medical work up.  Patient hopes to return home with his wife on discharge.) Barriers to Discharge: No Barriers Identified   Patient Goals and CMS Choice            Expected Discharge Plan and Services       Living arrangements for the past 2  months: Single Family Home                 DME Arranged:  (Patient has manual wheelchair, and specialized air mattress)                    Prior Living Arrangements/Services Living arrangements for the past 2 months: Single Family Home Lives with:: Spouse Patient language and need for interpreter reviewed:: Yes Do you feel safe going back to the place where you live?: Yes      Need for Family Participation in Patient Care: Yes (Comment) (Supportive Wife) Care giver support system in place?: Yes (comment) (Supportive Wife)      Activities of Daily Living      Permission Sought/Granted   Permission granted to share information with : Yes, Verbal Permission Granted              Emotional Assessment Appearance:: Appears stated age Attitude/Demeanor/Rapport: Engaged Affect (typically observed): Appropriate Orientation: : Oriented to Self, Oriented to Place, Oriented to  Time, Oriented to Situation      Admission diagnosis:  Hypokalemia [E87.6] Lactic acidosis [E87.20] Hypomagnesemia [E83.42] Hypophosphatemia [E83.39] Sepsis due to urinary tract infection [A41.9, N39.0] Hypotension, unspecified hypotension type [I95.9] Neutropenia, unspecified type [D70.9] Sepsis, due to  unspecified organism, unspecified whether acute organ dysfunction present [A41.9] Patient Active Problem List   Diagnosis Date Noted   Hypoglycemia 01/03/2023   Pressure injury of skin 01/03/2023   Thrombocytopenia 01/03/2023   Elevated LFTs 01/03/2023   Sepsis due to urinary tract infection 12/29/2022   Sinus tachycardia 10/02/2022   Infection due to urethral catheter 04/22/2020   Spinal cord injury, thoracic region 11/15/2017   Chronic, continuous use of opioids 04/27/2016   Depression 11/30/2015   Chronic hoarseness 10/26/2015   Hypothyroidism 10/26/2015   Elevated transaminase level 08/10/2015   Spasticity 07/30/2015   Paraplegia 07/21/2015   Bladder neurogenesis 04/28/2015    Infection due to drug-resistant organism 03/22/2015   Recurrent UTI 03/11/2015   Chronic pain associated with significant psychosocial dysfunction 02/18/2015   ADD (attention deficit disorder) 02/17/2015   Anxiety 02/17/2015   Chronic constipation 02/17/2015   Bed sore on buttock 02/17/2015   Abscess, dental 02/17/2015   Dermatitis, eczematoid 02/17/2015   Dizziness 02/17/2015   Esophageal reflux 02/17/2015   Hypesthesia 02/17/2015   Decubitus ulcer 02/17/2015   Muscle spasticity 02/17/2015   Concussion and swelling of spinal cord 02/17/2015   Body tinea 02/17/2015   GERD (gastroesophageal reflux disease) 02/17/2015   PCP:  Malva Limes, MD Pharmacy:   [facility] - [place], [place] - [address] [place] [address] Phone: [phone] Fax: [phone]  [facility] - [place], [place] - [phone] [place] [address] Phone: [phone] Fax: [phone]     Social Determinants of Health (SDOH) Social History: SDOH Screenings   Food Insecurity: No Food Insecurity (10/08/2022)  Housing: Low Risk  (10/08/2022)  Transportation Needs: No Transportation Needs (10/08/2022)  Utilities: Not At Risk (10/03/2022)  Alcohol Screen: Low Risk  (01/10/2022)  Depression (PHQ2-9): Medium Risk (01/10/2022)  Financial Resource Strain: Low Risk  (01/10/2022)  Social Connections: Socially Isolated (01/10/2022)  Stress: No Stress Concern Present (01/10/2022)  Tobacco Use: Medium Risk (11/26/2022)   SDOH Interventions:     Readmission Risk Interventions     No data to display

## 2023-01-03 NOTE — Progress Notes (Addendum)
Progress Note   Patient: Justin [name] [id number]:[id number] DOB: Oct 11, 1980 DOA: 12/29/2022     5 DOS: the patient was seen and examined on 01/03/2023   Brief hospital course: 42 y.o male with significant PMH of  Hypothyroidism, Asthma, incomplete low thoracic/lumbar Spinal cord injury, T7-T12, Spasticity 2/2 incomplete paraplegia, Neurogenic Bladder 2/2 SCI, chronic UTIs due to neurogenic bladder, chronic leg spasms, chronic pain syndrome, chronic constipation, Hypesthesia, Anxiety and Depression who presented to the ED with chief complaints of bilateral leg spasm, chills and rigor. Patient reporting intermittent uncontrolled leg spasms without clear cause for his worsening spasticity. He has chronic pain/anxiety on high doses of methadone, oxycodone, alprazolam, Baclofen, Gabapentin and tizanidine at home.    Pt was initially admitted to the ICU on 12/29/22 for septic shock due to UTI currently off pressors and transferred to [facility] 4/16.  Assessment and Plan:  #Septic Shock with endorgan dysfunction-present on admission #Atypical Pneumonia - POA On admission patient met SIRS criteria: HR 137, RR 34 and hypotensive requiring vasopressor support.  BP was as low as 77/41. Does have dysautonomia, patient septic secondary to UTI and community-acquired pneumonia Chest CT suggests possible pneumonia Required Levophed in the ICU, weaned off and transitioned to midodrine. --Reduce midodrine 10 >> 5 mg TID, taper off as BP tolerates --Initially on IV Cefepime and DOxycycline --Continue PO doxycycline and Augmentin --Urine culture result -- no growth --Blood cultures -- neg to date   #Nausea/Vomiting -- appears to be chronic issue, with antiemetics on home med list. LFT's elevated in setting of sepsis, improving. CT abdomen/pelvis negative for acute findings --PRN anti-emetics  #Hypoglycemia -- due to above, not tolerating much PO intake with N/V --Start D5-NS fluids --Monitor  CBG's --Hypoglycemia protocol   #Spasticity #Paraplegia #Autonomic Dysreflexia Patient presented with increased leg spasms in the setting of acute infection. He is on multiple medications for spasticity and pain, including methadone, oxycodone, alprazolam, Tizanidine, and baclofen at home. He is also on amphetamine-dextroamphetamine. Gabapentin has been added to patient's regimen this hospitalization, and home meds have been continued.    Plan -Switched amphetamine-dextroamphetamine to PRN -continue Baclofen 20 mg qid (home med dose) -continue methadone 20 mg q6hours -continue tizanidine 8 mg every 6 hours PRN -Morphine IV started PRN -Oxycodone PRN started -gabapentin PRN started -Continue naproxen given AKI -Patient will need outpatient neurology follow-up     #Neurogenic Bladder #AKI --Continue oxybutynin and Flovoxat --D/C Foley and transition to intermittent self-cath --Continue to monitor renal function --Avoid nephrotoxic medications   #Hypothyroidism  --Continue levothyroxine   Acute transaminitis likely secondary to septic shock -Improving --Monitor LFTs  Thrombocytopenia - POA Likely due to sepsis/infection.  Platelets 121 on admission >> nadir on 49 k on 4/17 Improving, Plt 59k --Monitor CBC  Pressure Injury of Skin - POA I agree with the wound description/s as outlined below --Frequent repositioning, offload pressure areas --Wound care --Monitor for signs of infection Pressure Injury 12/31/22 Buttocks Bilateral Stage 2 -  Partial thickness loss of dermis presenting as a shallow open injury with a red, pink wound bed without slough. (Active)  12/31/22 2000  Location: Buttocks  Location Orientation: Bilateral  Staging: Stage 2 -  Partial thickness loss of dermis presenting as a shallow open injury with a red, pink wound bed without slough.  Wound Description (Comments):   Present on Admission: Yes         Subjective: Pt was sleeping laid forward  when seen this AM.  Initially difficult to awaken, but once awake  he stayed engaged.  He reports ongoing intermittent nausea/vomiting.  Still not tolerating much oral intake.  Denies fever/chills, cough or other complaints.  Asked if I could call to update his family, he declined.  Physical Exam: Vitals:   01/02/23 1300 01/02/23 1636 01/03/23 0119 01/03/23 0750  BP:  119/71 131/80 99/64  Pulse:  69 77 66  Resp:  Temp: 100.3 F (37.9 C) 98.1 F (36.7 C) 98.2 F (36.8 C) 97.8 F (36.6 C)  TempSrc: Axillary     SpO2:  95% 99% 98%  Weight:      Height:       General exam: sleeping laid forward, woke to voice and physical stimulus, no acute distress HEENT: moist mucus membranes, hearing grossly normal  Respiratory system: CTAB, no wheezes, rales or rhonchi, normal respiratory effort. Cardiovascular system: normal S1/S2, RRR, +lower extremity and pedal edema.   Gastrointestinal system: soft, NT, ND Central nervous system: A&O x4. B/l lower extremity paralysis, normal speech Extremities: b/l feet in offloading boots, BLE   pedal edema Skin: dry, intact, normal temperature Psychiatry: normal mood, congruent affect, judgement and insight appear normal   Data Reviewed:  Notable labs ---  Ca 8.2, albumin 3.2, alkp phos 164 from 157, AST 60 >> 54, ALT 144 >> 122, T protein 5.6, T bili 1.5 >> 1.7, Hbg stable 10.9, platelets 49>>59k    Family Communication: none at bedside.  Pt declined my offer to call his family, states he can update them.  Disposition: Status is: Inpatient Remains inpatient appropriate because: remains on IV therapies as outlined, N/V not tolerating PO intake   Planned Discharge Destination: Home    Time spent: 46 minutes  Author: Pennie Banter, DO 01/03/2023 11:50 AM  For on call review [email or web address].

## 2023-01-03 NOTE — Care Management Important Message (Signed)
Important Message  Patient Details  Name: Justin [name] MRN: [id number] Date of Birth: [birth year]   Medicare Important Message Given:  N/Justin - LOS <3 / Initial given by admissions     Justin [name] Justin [name] 01/03/2023, 1:34 PM

## 2023-01-03 NOTE — Progress Notes (Signed)
Patient had multiple bouts of nausea during the night with one episode of vomiting. Provider aware, new orders given. Patient had phenergen IV x 1, Phenergan PR x 1, benadryl and xanax x 1. Patient reports he had been on xanax at home and had not received any here. Patient went for abd CT due to intractable nausea. Patient sleeping this morning during rounds. Patient reports he has finally gotten some relief from the nausea. Patient has been able to eat. Patient had eaten some of his salad at the bedside from dinner yesterday and some pudding.

## 2023-01-04 DIAGNOSIS — A419 Sepsis, unspecified organism: Secondary | ICD-10-CM | POA: Diagnosis not present

## 2023-01-04 DIAGNOSIS — N390 Urinary tract infection, site not specified: Secondary | ICD-10-CM | POA: Diagnosis not present

## 2023-01-04 LAB — CBC
HCT: 33.8 % — ABNORMAL LOW (ref 39.0–52.0)
Hemoglobin: 11 g/dL — ABNORMAL LOW (ref 13.0–17.0)
MCH: 27.8 pg (ref 26.0–34.0)
MCHC: 32.5 g/dL (ref 30.0–36.0)
MCV: 85.4 fL (ref 80.0–100.0)
Platelets: 69 10*3/uL — ABNORMAL LOW (ref 150–400)
RBC: 3.96 MIL/uL — ABNORMAL LOW (ref 4.22–5.81)
RDW: 14.5 % (ref 11.5–15.5)
WBC: 4.9 10*3/uL (ref 4.0–10.5)
nRBC: 0 % (ref 0.0–0.2)

## 2023-01-04 LAB — COMPREHENSIVE METABOLIC PANEL
ALT: 123 U/L — ABNORMAL HIGH (ref 0–44)
AST: 71 U/L — ABNORMAL HIGH (ref 15–41)
Albumin: 3.2 g/dL — ABNORMAL LOW (ref 3.5–5.0)
Alkaline Phosphatase: 158 U/L — ABNORMAL HIGH (ref 38–126)
Anion gap: 4 — ABNORMAL LOW (ref 5–15)
BUN: 10 mg/dL (ref 6–20)
CO2: 30 mmol/L (ref 22–32)
Calcium: 8.7 mg/dL — ABNORMAL LOW (ref 8.9–10.3)
Chloride: 105 mmol/L (ref 98–111)
Creatinine, Ser: 0.64 mg/dL (ref 0.61–1.24)
GFR, Estimated: >60 mL/min (ref >60)
Glucose, Bld: 86 mg/dL (ref 70–99)
Potassium: 4.1 mmol/L (ref 3.5–5.1)
Sodium: 139 mmol/L (ref 135–145)
Total Bilirubin: 1.5 mg/dL — ABNORMAL HIGH (ref 0.3–1.2)
Total Protein: 5.8 g/dL — ABNORMAL LOW (ref 6.5–8.1)

## 2023-01-04 LAB — MAGNESIUM: Magnesium: 1.5 mg/dL — ABNORMAL LOW (ref 1.7–2.4)

## 2023-01-04 LAB — GLUCOSE, CAPILLARY
Glucose-Capillary: 101 mg/dL — ABNORMAL HIGH (ref 70–99)
Glucose-Capillary: 105 mg/dL — ABNORMAL HIGH (ref 70–99)
Glucose-Capillary: 64 mg/dL — ABNORMAL LOW (ref 70–99)
Glucose-Capillary: 65 mg/dL — ABNORMAL LOW (ref 70–99)
Glucose-Capillary: 79 mg/dL (ref 70–99)
Glucose-Capillary: 79 mg/dL (ref 70–99)

## 2023-01-04 LAB — PHOSPHORUS: Phosphorus: 2.5 mg/dL (ref 2.5–4.6)

## 2023-01-04 LAB — CULTURE, BLOOD (ROUTINE X 2): Culture: NO GROWTH

## 2023-01-04 MED ORDER — MIDODRINE HCL 5 MG PO TABS
2.5000 mg | ORAL_TABLET | Freq: Three times a day (TID) | ORAL | Status: DC
  Administered 2023-01-04 (×2): 2.5 mg via ORAL
  Filled 2023-01-04 (×2): qty 1

## 2023-01-04 MED ORDER — GABAPENTIN 100 MG PO CAPS
200.0000 mg | ORAL_CAPSULE | Freq: Four times a day (QID) | ORAL | Status: DC | PRN
  Administered 2023-01-04 – 2023-01-07 (×9): 200 mg via ORAL
  Filled 2023-01-04 (×9): qty 2

## 2023-01-04 MED ORDER — MORPHINE SULFATE (PF) 2 MG/ML IV SOLN: 2.0000 mg | INTRAVENOUS | Status: DC | PRN

## 2023-01-04 MED ORDER — BISACODYL 10 MG RE SUPP: 10.0000 mg | Freq: Every day | RECTAL | Status: DC | PRN

## 2023-01-04 MED ORDER — DEXTROSE 10 % IV SOLN: INTRAVENOUS | Status: DC

## 2023-01-04 MED ORDER — MAGNESIUM SULFATE 4 GM/100ML IV SOLN
4.0000 g | Freq: Once | INTRAVENOUS | Status: AC
  Administered 2023-01-04: 4 g via INTRAVENOUS
  Filled 2023-01-04: qty 100

## 2023-01-04 MED ORDER — HYDROMORPHONE HCL 1 MG/ML IJ SOLN
0.5000 mg | INTRAMUSCULAR | Status: DC | PRN
  Administered 2023-01-04 (×2): 1 mg via INTRAVENOUS
  Filled 2023-01-04 (×2): qty 1

## 2023-01-04 NOTE — Progress Notes (Addendum)
Patient has complained about spasms most of the night. He reports that they come in waves and cause him pain. Patient has been medication with both scheduled and prn medications as often as he can have it. On call provider contacted at one point for additional meds. One time dose of fentanyl ordered and given with temporary relief. Patient did not sleep for the second night in a row. He would dose off occasionally during conversation but then wake right back up. Patient sitting up straight in bed most of the time. Patient encouraged to lie back or raise head of bed to optimal position for comfort and rest. Patient not agreeable at first but then agreed to try it. Patient reports rubbing legs when he is having spasms helps but was not helping much last night. At times active spasms could be seen in both legs but other times no spasms were seen at the time patient was reporting them. Patient encouraged to discuss concerns about additional or changed medication to assist with spasms with provider on rounds during the day. Patient agreeable to discuss during that time.

## 2023-01-04 NOTE — Consult Note (Signed)
   [facility] CM Inpatient Consult   01/04/2023  Wisam Siefring 1981/08/13 [id number]      Location: [facility] RN Hospital Liaison attempt to meet with pt via bedside however procedure being performed at bedside [facility]).   [facility] Customer service manager [facility]) Accountable Care Organization [ACO] Patient: Orthoptist)    Primary Care Provider:  Malva Limes, MD with [facility], Family Practice.   Patient screened for readmission hospitalization with noted medium risk score for unplanned readmission risk with  1 IP in 6 months. [facility] RN liaison will assess for potential [facility]) Care Management service needs for post hospital transition for care coordination.   Plan: [facility] RN Liaison will continue to follow ongoing disposition in assessing for post hospital community care coordination/management needs.  Referral request for community care coordination: pending disposition   [facility] Care Management/Population Health does not replace or interfere with any arrangements made by the Inpatient Transition of Care team.   For questions contact:    Elliot Cousin, RN, BSN [facility] Liaison [place]   [facility]  Population [facility] Office Hours MTWF 8:00 am to 6 pm off on Thursday [phone] mobile [phone] [Office toll free line][facility] Office Hours are M-F 8:30 - 5 pm 24 hour nurse advise line [phone] Conceirge  [email or web address]

## 2023-01-04 NOTE — Care Management Important Message (Signed)
Important Message  Patient Details  Name: Justin [name] MRN: [id number] Date of Birth: 1981-05-20   Medicare Important Message Given:  N/A - LOS <3 / Initial given by admissions     [name] 01/04/2023, 8:42 AM

## 2023-01-04 NOTE — Progress Notes (Addendum)
Progress Note   Patient: Justin [name] ZOX:[id number] DOB: 10/05/1980 DOA: 12/29/2022     6 DOS: the patient was seen and examined on 01/04/2023   Brief hospital course: 42 y.o male with significant PMH of  Hypothyroidism, Asthma, incomplete low thoracic/lumbar Spinal cord injury, T7-T12, Spasticity 2/2 incomplete paraplegia, Neurogenic Bladder 2/2 SCI, chronic UTIs due to neurogenic bladder, chronic leg spasms, chronic pain syndrome, chronic constipation, Hypesthesia, Anxiety and Depression who presented to the ED with chief complaints of bilateral leg spasm, chills and rigor. Patient reporting intermittent uncontrolled leg spasms without clear cause for his worsening spasticity. He has chronic pain/anxiety on high doses of methadone, oxycodone, alprazolam, Baclofen, Gabapentin and tizanidine at home.    Pt was initially admitted to the ICU on 12/29/22 for septic shock due to UTI currently off pressors and transferred to [facility] 4/16.  Assessment and Plan:  #Septic Shock with endorgan dysfunction-present on admission #Atypical Pneumonia - POA On admission patient met SIRS criteria: HR 137, RR 34 and hypotensive requiring vasopressor support.  BP was as low as 77/41. Does have dysautonomia, patient septic secondary to UTI and community-acquired pneumonia Chest CT suggests possible pneumonia Required Levophed in the ICU, weaned off and transitioned to midodrine. --Reduce midodrine 10 >> 5 mg TID, taper off as BP tolerates --Initially on IV Cefepime and DOxycycline --Continue PO doxycycline and Augmentin --Urine culture result -- no growth --Blood cultures -- neg to date   #Nausea/Vomiting -- appears to be chronic issue, with antiemetics on home med list. LFT's elevated in setting of sepsis, improving. CT abdomen/pelvis negative for acute findings --PRN anti-emetics  #Hypoglycemia -- due to above, not tolerating much PO intake with N/V Has tense pedal edema from fluids  third-spacing. --Change D5-NS @ 75 cc/hr to D10w at 20 cc/hr --Monitor CBG's --Hypoglycemia protocol   #Spasticity #Paraplegia #Autonomic Dysreflexia Patient presented with increased leg spasms in the setting of acute infection. He is on multiple medications for spasticity and pain, including methadone, oxycodone, alprazolam, Tizanidine, and baclofen at home. He is also on amphetamine-dextroamphetamine. Gabapentin has been added to patient's regimen this hospitalization, and home meds have been continued.    Plan -Resumed home Adderall BID -continue Baclofen 20 mg QID (home dose, max'd) -continue methadone 20 mg QID (home dose) -continue tizanidine 8 mg QID -Change IV morphine to dilaudid PRN -- he requested a PCA for his spasms, but is too sedated, would be more risk than benefit. -Oxycodone PRN  -gabapentin PRN  -Continue naproxen given AKI -Patient will need outpatient neurology follow-up     #Neurogenic Bladder #AKI --Continue oxybutynin and Flovoxat --D/C Foley and transition to intermittent self-cath --Continue to monitor renal function --Avoid nephrotoxic medications   #Hypothyroidism  --Continue levothyroxine   Acute transaminitis likely secondary to septic shock -Improving --Monitor LFTs  Thrombocytopenia - POA Likely due to sepsis/infection.  Platelets 121 on admission >> nadir on 49 k on 4/17 Improving, Plt 59k --Monitor CBC  Pressure Injury of Skin - POA I agree with the wound description/s as outlined below --Frequent repositioning, offload pressure areas --Wound care --Monitor for signs of infection Pressure Injury 12/31/22 Buttocks Bilateral Stage 2 -  Partial thickness loss of dermis presenting as a shallow open injury with a red, pink wound bed without slough. (Active)  12/31/22 2000  Location: Buttocks  Location Orientation: Bilateral  Staging: Stage 2 -  Partial thickness loss of dermis presenting as a shallow open injury with a red, pink wound  bed without slough.  Wound Description (  Comments):   Present on Admission: Yes         Subjective: Pt was sleeping laid forward when seen this AM. He was difficult to arouse, required loud voice and physical stimulus.  Once awake, he appears very drowsy, some difficulty staying awake and sitting upright.  He reports no sleep overnight due to muscle spasms, continually rubs his right thigh during the encounter.  Reports brief relief with IV pain medicine given overnight and asks for a PCA pump. States he's been given PCA pumps in prior admissions bc its the only thing that helps his spasms when they're uncontrolled.  I informed him I'm not familiar with use of PCA opiates for this indication, but would research it and consider, but concern of polypharmacy with all his other meds and being sedated.    He denies N/V today, reports it comes and goes chronically.  Having BM's but not as regularly as at home where he uses a rectal tube.   Physical Exam: Vitals:   01/03/23 0750 01/03/23 1528 01/04/23 0016 01/04/23 0805  BP: 99/64 120/72 138/82 136/72  Pulse: 66 77 85 (!) 102  Resp: Temp: 97.8 F (36.6 C) 98.7 F (37.1 C) 99.5 F (37.5 C) 100.3 F (37.9 C)  TempSrc:      SpO2: 98% 95% 95% 98%  Weight:      Height:       General exam: sleeping laid forward, woke to voice and physical stimulus, no acute distress, appears very drowsy when awake, nods off HEENT: moist mucus membranes, hearing grossly normal  Respiratory system: on room air, normal respiratory effort. Cardiovascular system: normal S1/S2, RRR, tense b/l pedal edema.   Central nervous system: A&O x4. B/l lower extremity paralysis, normal speech Extremities: b/l feet in offloading boots, BLE  pedal edema Skin: dry, intact, normal temperature Psychiatry: normal mood, congruent affect, judgement and insight appear normal   Data Reviewed:  Notable labs ---  Ca 8.7, albumin 3.2, alkp phos 158, AST 71, ALT 123, T  bili 1.5, Hbg stable 11.0, platelets 49>>59 >> 69k  Magnesium 1.5   Family Communication: none at bedside.  Pt declined my offer to call his family, states he can update them.  Disposition: Status is: Inpatient Remains inpatient appropriate because: remains on IV therapies as outlined, N/V not tolerating PO intake. Hypoglycemic on dextrose fluids   Planned Discharge Destination: Home    Time spent: 46 minutes  Author: Pennie Banter, DO 01/04/2023 3:14 PM  For on call review [email or web address].

## 2023-01-04 NOTE — Plan of Care (Signed)

## 2023-01-04 NOTE — Evaluation (Signed)
Physical Therapy Evaluation Patient Details Name: Justin [name] MRN: [id number] DOB: [birth year] Today's Date: 01/04/2023  History of Present Illness  Pt is a 42 yo male admitted for sepsis due to UTI and is also being treated for PNA. PMH of thoracic spinal cord injury (T7-T12) with incomplete paraplegia, hypothyroidism, recurrent UTI, ADD, anxiety.   Clinical Impression  Patient alert, agreeable to PT but reported feeling limited due bilateral LE pain/muscle spasms. Pt reported at baseline he is predominantly bed bound but does transfer bed <> wc and tub bench for showers. Wife provides set up but pt does not need physical assistance.   Pt in long sitting throughout majority of session, able to reposition and adjust seated position without assistance, noted for good BUE strength. Bed <> wc lateral scoot, minA-CGA noted for one LOB anteriorly but predominantly set up assistance needed only. Pt reported and demonstrated return to baseline functional mobility, and reported physical therapy is challenging for him due to pain levels and pain medication dosing, no physical therapy discharge recs, will follow acutely this admission.       Recommendations for follow up therapy are one component of a multi-disciplinary discharge planning process, led by the attending physician.  Recommendations may be updated based on patient status, additional functional criteria and insurance authorization.  Follow Up Recommendations       Assistance Recommended at Discharge Intermittent Supervision/Assistance  Patient can return home with the following  Assistance with cooking/housework;Assist for transportation;Help with stairs or ramp for entrance;A little help with bathing/dressing/bathroom    Equipment Recommendations None recommended by PT  Recommendations for Other Services       Functional Status Assessment Patient has not had a recent decline in their functional status     Precautions /  Restrictions Precautions Precautions: Fall      Mobility  Bed Mobility Overal bed mobility: Modified Independent             General bed mobility comments: sitting in long sitting throughout session    Transfers Overall transfer level: Needs assistance   Transfers: Bed to chair/wheelchair/BSC            Lateral/Scoot Transfers: Supervision, Min assist General transfer comment: minA due to imbalance anteriorly but primarily supervision/set up assistance to transfer EOB <> WC    Ambulation/Gait                  Stairs            Wheelchair Mobility    Modified Rankin (Stroke Patients Only)       Balance Overall balance assessment: Needs assistance   Sitting balance-Leahy Scale: Good                                       Pertinent Vitals/Pain Pain Assessment Pain Assessment: Faces Faces Pain Scale: Hurts whole lot Pain Location: bilateral leg pain Pain Descriptors / Indicators: Aching Pain Intervention(s): Limited activity within patient's tolerance, Monitored during session, RN gave pain meds during session, Repositioned    Home Living Family/patient expects to be discharged to:: Private residence Living Arrangements: Spouse/significant other Available Help at Discharge: Family Type of Home: House         Home Layout: One level Home Equipment: Wheelchair - manual;Tub bench      Prior Function Prior Level of Function : Needs assist  Mobility Comments: able to transfer (lateral scoot) in and out of wheelchair without physical assistance, set up help only. predominantly bed bound ADLs Comments: set up and supervision assist only     Hand Dominance        Extremity/Trunk Assessment   Upper Extremity Assessment Upper Extremity Assessment: Overall WFL for tasks assessed    Lower Extremity Assessment Lower Extremity Assessment:  (chronic SCI; reported incomplete sensation)       Communication    Communication: No difficulties  Cognition Arousal/Alertness: Awake/alert Behavior During Therapy: WFL for tasks assessed/performed Overall Cognitive Status: Within Functional Limits for tasks assessed                                          General Comments      Exercises     Assessment/Plan    PT Assessment Patient does not need any further PT services  PT Problem List         PT Treatment Interventions      PT Goals (Current goals can be found in the Care Plan section)       Frequency       Co-evaluation               AM-PAC PT "6 Clicks" Mobility  Outcome Measure Help needed turning from your back to your side while in a flat bed without using bedrails?: A Little Help needed moving from lying on your back to sitting on the side of a flat bed without using bedrails?: A Little Help needed moving to and from a bed to a chair (including a wheelchair)?: A Little Help needed standing up from a chair using your arms (e.g., wheelchair or bedside chair)?: Total Help needed to walk in hospital room?: Total Help needed climbing 3-5 steps with a railing? : Total 6 Click Score: 12    End of Session   Activity Tolerance: Patient tolerated treatment well;Patient limited by pain Patient left: in bed;with call bell/phone within reach Nurse Communication: Mobility status PT Visit Diagnosis: Other abnormalities of gait and mobility (R26.89)    Time: 1610-9604 PT Time Calculation (min) (ACUTE ONLY): 36 min   Charges:   PT Evaluation $PT Eval Low Complexity: 1 Low PT Treatments $Therapeutic Activity: 23-37 mins        Olga Coaster PT, DPT 11:57 AM,01/04/23

## 2023-01-05 DIAGNOSIS — N390 Urinary tract infection, site not specified: Secondary | ICD-10-CM | POA: Diagnosis not present

## 2023-01-05 DIAGNOSIS — A419 Sepsis, unspecified organism: Secondary | ICD-10-CM | POA: Diagnosis not present

## 2023-01-05 LAB — COMPREHENSIVE METABOLIC PANEL
ALT: 117 U/L — ABNORMAL HIGH (ref 0–44)
AST: 72 U/L — ABNORMAL HIGH (ref 15–41)
Albumin: 3.4 g/dL — ABNORMAL LOW (ref 3.5–5.0)
Alkaline Phosphatase: 140 U/L — ABNORMAL HIGH (ref 38–126)
Anion gap: 8 (ref 5–15)
BUN: 9 mg/dL (ref 6–20)
CO2: 29 mmol/L (ref 22–32)
Calcium: 8.9 mg/dL (ref 8.9–10.3)
Chloride: 100 mmol/L (ref 98–111)
Creatinine, Ser: 0.66 mg/dL (ref 0.61–1.24)
GFR, Estimated: >60 mL/min (ref >60)
Glucose, Bld: 90 mg/dL (ref 70–99)
Potassium: 3.7 mmol/L (ref 3.5–5.1)
Sodium: 137 mmol/L (ref 135–145)
Total Bilirubin: 1.5 mg/dL — ABNORMAL HIGH (ref 0.3–1.2)
Total Protein: 6.2 g/dL — ABNORMAL LOW (ref 6.5–8.1)

## 2023-01-05 LAB — CBC
HCT: 35 % — ABNORMAL LOW (ref 39.0–52.0)
Hemoglobin: 11.5 g/dL — ABNORMAL LOW (ref 13.0–17.0)
MCH: 27.5 pg (ref 26.0–34.0)
MCHC: 32.9 g/dL (ref 30.0–36.0)
MCV: 83.7 fL (ref 80.0–100.0)
Platelets: 97 10*3/uL — ABNORMAL LOW (ref 150–400)
RBC: 4.18 MIL/uL — ABNORMAL LOW (ref 4.22–5.81)
RDW: 14.3 % (ref 11.5–15.5)
WBC: 6.6 10*3/uL (ref 4.0–10.5)
nRBC: 0 % (ref 0.0–0.2)

## 2023-01-05 LAB — GLUCOSE, CAPILLARY
Glucose-Capillary: 74 mg/dL (ref 70–99)
Glucose-Capillary: 108 mg/dL — ABNORMAL HIGH (ref 70–99)
Glucose-Capillary: 145 mg/dL — ABNORMAL HIGH (ref 70–99)
Glucose-Capillary: 130 mg/dL — ABNORMAL HIGH (ref 70–99)
Glucose-Capillary: 107 mg/dL — ABNORMAL HIGH (ref 70–99)

## 2023-01-05 LAB — CORTISOL-AM, BLOOD: Cortisol - AM: 4.8 ug/dL — ABNORMAL LOW (ref 6.7–22.6)

## 2023-01-05 LAB — MAGNESIUM: Magnesium: 1.6 mg/dL — ABNORMAL LOW (ref 1.7–2.4)

## 2023-01-05 MED ORDER — MAGNESIUM CHLORIDE 64 MG PO TBEC
1.0000 | DELAYED_RELEASE_TABLET | Freq: Two times a day (BID) | ORAL | Status: DC
  Administered 2023-01-05 – 2023-01-07 (×5): 64 mg via ORAL
  Filled 2023-01-05 (×6): qty 1

## 2023-01-05 MED ORDER — TIZANIDINE HCL 4 MG PO TABS: 8.0000 mg | ORAL_TABLET | Freq: Four times a day (QID) | ORAL | Status: DC | PRN

## 2023-01-05 MED ORDER — IBUPROFEN 400 MG PO TABS
400.0000 mg | ORAL_TABLET | Freq: Four times a day (QID) | ORAL | Status: DC
  Administered 2023-01-05 – 2023-01-07 (×8): 400 mg via ORAL
  Filled 2023-01-05 (×8): qty 1

## 2023-01-05 MED ORDER — FUROSEMIDE 10 MG/ML IJ SOLN
20.0000 mg | Freq: Once | INTRAMUSCULAR | Status: AC
  Administered 2023-01-05: 20 mg via INTRAVENOUS
  Filled 2023-01-05: qty 4

## 2023-01-05 MED ORDER — HYDROMORPHONE HCL 1 MG/ML IJ SOLN: 0.5000 mg | INTRAMUSCULAR | Status: DC | PRN

## 2023-01-05 MED ORDER — BACLOFEN 10 MG PO TABS: 20.0000 mg | ORAL_TABLET | Freq: Four times a day (QID) | ORAL | Status: DC | PRN

## 2023-01-05 MED ORDER — MAGNESIUM SULFATE 4 GM/100ML IV SOLN
4.0000 g | Freq: Once | INTRAVENOUS | Status: AC
  Administered 2023-01-05: 4 g via INTRAVENOUS
  Filled 2023-01-05: qty 100

## 2023-01-05 MED ORDER — BACLOFEN 10 MG PO TABS
20.0000 mg | ORAL_TABLET | Freq: Four times a day (QID) | ORAL | Status: DC
  Administered 2023-01-05 – 2023-01-07 (×8): 20 mg via ORAL
  Filled 2023-01-05 (×8): qty 2

## 2023-01-05 MED ORDER — POTASSIUM CHLORIDE CRYS ER 20 MEQ PO TBCR
40.0000 meq | EXTENDED_RELEASE_TABLET | Freq: Once | ORAL | Status: AC
  Administered 2023-01-05: 40 meq via ORAL
  Filled 2023-01-05: qty 2

## 2023-01-05 MED ORDER — IBUPROFEN 400 MG PO TABS: 400.0000 mg | ORAL_TABLET | Freq: Four times a day (QID) | ORAL | Status: DC | PRN

## 2023-01-05 MED ORDER — TIZANIDINE HCL 4 MG PO TABS
8.0000 mg | ORAL_TABLET | Freq: Three times a day (TID) | ORAL | Status: DC
  Administered 2023-01-05 – 2023-01-07 (×6): 8 mg via ORAL
  Filled 2023-01-05 (×6): qty 2

## 2023-01-05 NOTE — Progress Notes (Addendum)
Progress Note   Patient: Justin [name] ZOX:[id number] DOB: Dec 04, 1980 DOA: 12/29/2022     7 DOS: the patient was seen and examined on 01/05/2023   Brief hospital course: 42 y.o male with significant PMH of  Hypothyroidism, Asthma, incomplete low thoracic/lumbar Spinal cord injury, T7-T12, Spasticity 2/2 incomplete paraplegia, Neurogenic Bladder 2/2 SCI, chronic UTIs due to neurogenic bladder, chronic leg spasms, chronic pain syndrome, chronic constipation, Hypesthesia, Anxiety and Depression who presented to the ED with chief complaints of bilateral leg spasm, chills and rigor. Patient reporting intermittent uncontrolled leg spasms without clear cause for his worsening spasticity. He has chronic pain/anxiety on high doses of methadone, oxycodone, alprazolam, Baclofen, Gabapentin and tizanidine at home.    Pt was initially admitted to the ICU on 12/29/22 for septic shock due to UTI currently off pressors and transferred to [facility] 4/16.  Assessment and Plan:  #Septic Shock with endorgan dysfunction-present on admission #Atypical Pneumonia - POA On admission patient met SIRS criteria: HR 137, RR 34 and hypotensive requiring vasopressor support.  BP was as low as 77/41. Does have dysautonomia, patient septic secondary to UTI and community-acquired pneumonia Chest CT suggests possible pneumonia Required Levophed in the ICU, weaned off and transitioned to midodrine. --BP's stable on low dose midodrine, will stop it and monitor BP closely --Initially on IV Cefepime and DOxycycline --Completed course with PO doxycycline and Augmentin --Urine culture result -- no growth --Blood cultures -- negative, final  #Nausea/Vomiting -- appears to be chronic issue, with antiemetics on home med list. Pt acknowledges chronic intermittent N/V, uses phenergan at home. #LFT's elevated in setting of sepsis, improving.  CT abdomen/pelvis negative for acute findings --PRN anti-emetics  #Hypoglycemia -- due  to above, but now tolerating PO intake well. --Will stop D10w at 20 cc/hr today and monitor closely --Monitor CBG's --Hypoglycemia protocol --Check AM cortisol  #Spasticity #Paraplegia #Autonomic Dysreflexia Patient presented with increased leg spasms in the setting of acute infection. He is on multiple medications for spasticity and pain, including methadone, oxycodone, alprazolam, Tizanidine, and baclofen at home. He is also on amphetamine-dextroamphetamine. Gabapentin has been added to patient's regimen this hospitalization, and home meds have been continued.    Plan -Resumed home Adderall BID -continue Baclofen 20 mg QID (home dose, max'd) -continue methadone 20 mg QID (home dose) -continue tizanidine 8 mg QID -Change IV morphine to dilaudid PRN -- he requested a PCA for his spasms, but is too sedated, would be more risk than benefit. -Oxycodone PRN  -gabapentin PRN  -Continue naproxen given AKI -Patient will need outpatient neurology follow-up   #Lower Extremity / Pedal Edema - from third-spacing of fluids --Lasix 20 mg IV x 1 today   #Neurogenic Bladder #AKI --Continue oxybutynin and Flovoxat --D/C Foley and transition to intermittent self-cath --Continue to monitor renal function --Avoid nephrotoxic medications   #Hypothyroidism  --Continue levothyroxine   Acute transaminitis likely secondary to septic shock -Improving --Monitor LFTs  Thrombocytopenia - POA Likely due to sepsis/infection.  Platelets 121 on admission >> nadir on 49 k on 4/17 Improving, Plt 59k --Monitor CBC  Pressure Injury of Skin - POA I agree with the wound description/s as outlined below --Frequent repositioning, offload pressure areas --Wound care --Monitor for signs of infection Pressure Injury 12/31/22 Buttocks Bilateral Stage 2 -  Partial thickness loss of dermis presenting as a shallow open injury with a red, pink wound bed without slough. (Active)  12/31/22 2000  Location: Buttocks   Location Orientation: Bilateral  Staging: Stage 2 -  Partial thickness loss of dermis presenting as a shallow open injury with a red, pink wound bed without slough.  Wound Description (Comments):   Present on Admission: Yes         Subjective: Pt was sleeping laid forward again when seen this AM. Today, he did not awaken despite loud vocal and physical stimulus.  His nurse reports he continues to request PCA opioid for his spasms.  We discussed PCA not indication for spasms, and pt too sedated on his current medication regimen.  He also reported to nurse that IV dilaudid did not help much when given earlier or overnight.  No acute events reported.  No nausea/vomiting, tolerating PO intake.   Physical Exam: Vitals:   01/04/23 0805 01/04/23 1617 01/04/23 2357 01/05/23 0802  BP: 136/72 135/79 105/74 (!) 154/79  Pulse: (!) 102 79 77   Resp: Temp: 100.3 F (37.9 C) 98.2 F (36.8 C) 97.8 F (36.6 C) 99.7 F (37.6 C)  TempSrc:    Oral  SpO2: 98% 97% 97% 94%  Weight:      Height:       General exam: sleeping laid forward, did not awaken to loud voice and physical stimulus, no acute distress HEENT: moist mucus membranes, hearing grossly normal  Respiratory system: on room air, normal respiratory effort. CTAB Cardiovascular system: normal S1/S2, RRR, tense b/l pedal edema.   Central nervous system: A&O x4. B/l lower extremity paralysis Extremities: b/l feet in offloading boots, BLE  pedal edema Skin: dry, intact, normal temperature, healed surgical scar on midline lower back Psychiatry: unable to evaluate due to somnolence   Data Reviewed:  Notable labs --- Normal BMP.  LFT's improving AST 72, ALT 117, Tbili 1.5, albumin 3.4, Hbg 11.5 stable, platelets improved 97k from 69k.   Magnesium 1.5 >> 1.6 despite 4g IV Mg replaced yesterday   Family Communication: none at bedside.  Pt declined my offer to call his family, states he can update them.  Disposition: Status  is: Inpatient Remains inpatient appropriate because: monitoring glucose closely off dextrose fluids given recurrent hypoglycemia, electrolyte derangements requiring replacement and ongoing monitoring.   Planned Discharge Destination: Home    Time spent: 46 minutes  Author: Pennie Banter, DO 01/05/2023 11:52 AM  For on call review [email or web address].

## 2023-01-05 NOTE — Plan of Care (Signed)

## 2023-01-05 NOTE — TOC Initial Note (Signed)
Transition of Care [facility]) - Initial/Assessment Note    Patient Details  Name: Justin [name] MRN: [id number] Date of Birth: 26-Oct-1980  Transition of Care [facility]) CM/SW Contact:    Allena Katz, LCSW Phone Number: 01/05/2023, 4:23 PM  Clinical Narrative:     CSW spoke with patient about HH. Pt continued to fall in and out of consciousness talking about things like martial arts and mortal combat when asked health questions...  AID and RN notified.   [facility] said they can accept for RN/AID.                 Expected Discharge Plan:  (Pending Medical work up.  Patient hopes to return home with his wife on discharge.) Barriers to Discharge: No Barriers Identified   Patient Goals and CMS Choice            Expected Discharge Plan and Services       Living arrangements for the past 2 months: Single Family Home                 DME Arranged:  (Patient has manual wheelchair, and specialized air mattress)                    Prior Living Arrangements/Services Living arrangements for the past 2 months: Single Family Home Lives with:: Spouse Patient language and need for interpreter reviewed:: Yes Do you feel safe going back to the place where you live?: Yes      Need for Family Participation in Patient Care: Yes (Comment) (Supportive Wife) Care giver support system in place?: Yes (comment) (Supportive Wife)      Activities of Daily Living      Permission Sought/Granted   Permission granted to share information with : Yes, Verbal Permission Granted              Emotional Assessment Appearance:: Appears stated age Attitude/Demeanor/Rapport: Engaged Affect (typically observed): Appropriate Orientation: : Oriented to Self, Oriented to Place, Oriented to  Time, Oriented to Situation      Admission diagnosis:  Hypokalemia [E87.6] Lactic acidosis [E87.20] Hypomagnesemia [E83.42] Hypophosphatemia [E83.39] Sepsis due to urinary tract infection  [A41.9, N39.0] Hypotension, unspecified hypotension type [I95.9] Neutropenia, unspecified type [D70.9] Sepsis, due to unspecified organism, unspecified whether acute organ dysfunction present [A41.9] Patient Active Problem List   Diagnosis Date Noted   Hypoglycemia 01/03/2023   Pressure injury of skin 01/03/2023   Thrombocytopenia 01/03/2023   Elevated LFTs 01/03/2023   Sepsis due to urinary tract infection 12/29/2022   Sinus tachycardia 10/02/2022   Infection due to urethral catheter 04/22/2020   Spinal cord injury, thoracic region 11/15/2017   Chronic, continuous use of opioids 04/27/2016   Depression 11/30/2015   Chronic hoarseness 10/26/2015   Hypothyroidism 10/26/2015   Elevated transaminase level 08/10/2015   Spasticity 07/30/2015   Paraplegia 07/21/2015   Bladder neurogenesis 04/28/2015   Infection due to drug-resistant organism 03/22/2015   Recurrent UTI 03/11/2015   Chronic pain associated with significant psychosocial dysfunction 02/18/2015   ADD (attention deficit disorder) 02/17/2015   Anxiety 02/17/2015   Chronic constipation 02/17/2015   Bed sore on buttock 02/17/2015   Abscess, dental 02/17/2015   Dermatitis, eczematoid 02/17/2015   Dizziness 02/17/2015   Esophageal reflux 02/17/2015   Hypesthesia 02/17/2015   Decubitus ulcer 02/17/2015   Muscle spasticity 02/17/2015   Concussion and swelling of spinal cord 02/17/2015   Body tinea 02/17/2015   GERD (gastroesophageal reflux disease) 02/17/2015   PCP:  Mila Merry  Bea Laura, MD Pharmacy:   [facility] - [place], [place] - [address] [place] [address] Phone: [phone] Fax: [phone]  [facility] - [place], [place] - [phone] [place] [address] Phone: [phone] Fax: [phone]     Social Determinants of Health (SDOH) Social History: SDOH Screenings   Food Insecurity: No Food Insecurity (10/08/2022)  Housing: Low Risk  (10/08/2022)   Transportation Needs: No Transportation Needs (10/08/2022)  Utilities: Not At Risk (10/03/2022)  Alcohol Screen: Low Risk  (01/10/2022)  Depression (PHQ2-9): Medium Risk (01/10/2022)  Financial Resource Strain: Low Risk  (01/10/2022)  Social Connections: Socially Isolated (01/10/2022)  Stress: No Stress Concern Present (01/10/2022)  Tobacco Use: Medium Risk (11/26/2022)   SDOH Interventions:     Readmission Risk Interventions     No data to display

## 2023-01-06 ENCOUNTER — Inpatient Hospital Stay: Payer: Medicare HMO

## 2023-01-06 DIAGNOSIS — N390 Urinary tract infection, site not specified: Secondary | ICD-10-CM | POA: Diagnosis not present

## 2023-01-06 DIAGNOSIS — A419 Sepsis, unspecified organism: Secondary | ICD-10-CM | POA: Diagnosis not present

## 2023-01-06 DIAGNOSIS — G5632 Lesion of radial nerve, left upper limb: Secondary | ICD-10-CM | POA: Diagnosis not present

## 2023-01-06 LAB — COMPREHENSIVE METABOLIC PANEL
ALT: 100 U/L — ABNORMAL HIGH (ref 0–44)
AST: 57 U/L — ABNORMAL HIGH (ref 15–41)
Albumin: 3.2 g/dL — ABNORMAL LOW (ref 3.5–5.0)
Alkaline Phosphatase: 117 U/L (ref 38–126)
Anion gap: 8 (ref 5–15)
BUN: 11 mg/dL (ref 6–20)
CO2: 28 mmol/L (ref 22–32)
Calcium: 8.4 mg/dL — ABNORMAL LOW (ref 8.9–10.3)
Chloride: 104 mmol/L (ref 98–111)
Creatinine, Ser: 0.59 mg/dL — ABNORMAL LOW (ref 0.61–1.24)
GFR, Estimated: >60 mL/min (ref >60)
Glucose, Bld: 91 mg/dL (ref 70–99)
Potassium: 4 mmol/L (ref 3.5–5.1)
Sodium: 140 mmol/L (ref 135–145)
Total Bilirubin: 1 mg/dL (ref 0.3–1.2)
Total Protein: 6.2 g/dL — ABNORMAL LOW (ref 6.5–8.1)

## 2023-01-06 LAB — GLUCOSE, CAPILLARY
Glucose-Capillary: 117 mg/dL — ABNORMAL HIGH (ref 70–99)
Glucose-Capillary: 118 mg/dL — ABNORMAL HIGH (ref 70–99)
Glucose-Capillary: 131 mg/dL — ABNORMAL HIGH (ref 70–99)
Glucose-Capillary: 67 mg/dL — ABNORMAL LOW (ref 70–99)
Glucose-Capillary: 74 mg/dL (ref 70–99)
Glucose-Capillary: 86 mg/dL (ref 70–99)
Glucose-Capillary: 89 mg/dL (ref 70–99)
Glucose-Capillary: 90 mg/dL (ref 70–99)

## 2023-01-06 LAB — CBC
HCT: 36 % — ABNORMAL LOW (ref 39.0–52.0)
Hemoglobin: 11.6 g/dL — ABNORMAL LOW (ref 13.0–17.0)
MCH: 27.4 pg (ref 26.0–34.0)
MCHC: 32.2 g/dL (ref 30.0–36.0)
MCV: 84.9 fL (ref 80.0–100.0)
Platelets: 142 10*3/uL — ABNORMAL LOW (ref 150–400)
RBC: 4.24 MIL/uL (ref 4.22–5.81)
RDW: 14.6 % (ref 11.5–15.5)
WBC: 5.4 10*3/uL (ref 4.0–10.5)
nRBC: 0 % (ref 0.0–0.2)

## 2023-01-06 LAB — CORTISOL-AM, BLOOD: Cortisol - AM: 3.6 ug/dL — ABNORMAL LOW (ref 6.7–22.6)

## 2023-01-06 LAB — MAGNESIUM: Magnesium: 2.1 mg/dL (ref 1.7–2.4)

## 2023-01-06 MED ORDER — MEDIHONEY WOUND/BURN DRESSING EX PSTE
1.0000 | PASTE | Freq: Every day | CUTANEOUS | Status: DC
  Filled 2023-01-06: qty 44

## 2023-01-06 MED ORDER — DIAZEPAM 5 MG/ML IJ SOLN: 2.5000 mg | Freq: Once | INTRAMUSCULAR | Status: DC

## 2023-01-06 MED ORDER — OXYCODONE HCL 5 MG PO TABS
15.0000 mg | ORAL_TABLET | ORAL | Status: DC | PRN
  Administered 2023-01-06 – 2023-01-07 (×6): 15 mg via ORAL
  Filled 2023-01-06 (×6): qty 3

## 2023-01-06 MED ORDER — DIAZEPAM 5 MG/ML IJ SOLN
2.5000 mg | Freq: Once | INTRAMUSCULAR | Status: AC | PRN
  Administered 2023-01-06: 2.5 mg via INTRAVENOUS
  Filled 2023-01-06: qty 2

## 2023-01-06 MED ORDER — COSYNTROPIN 0.25 MG IJ SOLR
0.2500 mg | Freq: Once | INTRAMUSCULAR | Status: AC
  Administered 2023-01-07: 0.25 mg via INTRAVENOUS
  Filled 2023-01-06: qty 0.25

## 2023-01-06 MED ORDER — LINACLOTIDE 145 MCG PO CAPS
145.0000 ug | ORAL_CAPSULE | Freq: Every day | ORAL | Status: DC
  Administered 2023-01-07: 145 ug via ORAL
  Filled 2023-01-06 (×3): qty 1

## 2023-01-06 MED ORDER — PROMETHAZINE HCL 25 MG PO TABS: 25.0000 mg | ORAL_TABLET | Freq: Four times a day (QID) | ORAL | Status: DC | PRN

## 2023-01-06 MED ORDER — BACITRACIN ZINC 500 UNIT/GM EX OINT
TOPICAL_OINTMENT | Freq: Two times a day (BID) | CUTANEOUS | Status: DC
  Administered 2023-01-07: 1 via TOPICAL
  Filled 2023-01-06 (×3): qty 0.9

## 2023-01-06 NOTE — Plan of Care (Signed)

## 2023-01-06 NOTE — Progress Notes (Signed)
MRI brain and C-spine without significant findings, please see earlier note for full details. Recommend OT evaluation and splinting with expectation his radial nerve palsy will gradually improve.   Please call neurology with further questions or concerns.   Ritta Slot, MD [facility] [phone]  If 7pm- 7am, please page neurology on call as listed in AMION.

## 2023-01-06 NOTE — Consult Note (Signed)
WOC Nurse Consult Note: Reason for Consult:Stage 3 pressure injury to right ischial tuberosity, healed full thickness pressure injury to left IT.  Wound type:pressure plus friction Pressure Injury POA: Yes Measurement: Bedside RN to measure and document measurements on Nursing Flow Sheet with application of next dressing today Wound GNF:AOZH, moist. See also photodocumentation provided to EMR by Provider. Drainage (amount, consistency, odor) small to moderate serous Periwound: with evidence of chronic friction injury. Dressing procedure/placement/frequency: Nursing is provided with guidance to keep HOB at or below a 30 degree angle to promote wound healing to the IT and decrease friction from sliding. DermaTherapy bed linen system is in use. Topical acre using a calcium alginate dressing is recommended and this and the recently healed Left IT wound and sacrum all protected with silicone foams.  Heels are to be floated.  WOC nursing team will not follow, but will remain available to this patient, the nursing and medical teams.  Please re-consult if needed.  Thank you for inviting [place] to participate in this patient's Plan of Care.  Ladona Mow, MSN, RN, CNS, GNP, Leda Min, [employer], [employer] phone:  [phone]

## 2023-01-06 NOTE — Discharge Summary (Signed)
Physician Discharge Summary   Patient: Justin [name] MRN: [id number] DOB: 1981-05-07  Admit date:     12/29/2022  Discharge date: {dischdate:26783}  Discharge Physician: Pennie Banter   PCP: Malva Limes, MD   Recommendations at discharge:  {Tip this will not be part of the note when signed- Example include specific recommendations for outpatient follow-up, pending tests to follow-up on. (Optional):26781}  ***  Discharge Diagnoses: Principal Problem:   Sepsis due to urinary tract infection Active Problems:   Hypoglycemia   Pressure injury of skin   Thrombocytopenia   Elevated LFTs  Resolved Problems:   * No resolved hospital problems. *  Hospital Course: 42 y.o male with significant PMH of  Hypothyroidism, Asthma, incomplete low thoracic/lumbar Spinal cord injury, T7-T12, Spasticity 2/2 incomplete paraplegia, Neurogenic Bladder 2/2 SCI, chronic UTIs due to neurogenic bladder, chronic leg spasms, chronic pain syndrome, chronic constipation, Hypesthesia, Anxiety and Depression who presented to the ED with chief complaints of bilateral leg spasm, chills and rigor. Patient reporting intermittent uncontrolled leg spasms without clear cause for his worsening spasticity. He has chronic pain/anxiety on high doses of methadone, oxycodone, alprazolam, Baclofen, Gabapentin and tizanidine at home.    Pt was initially admitted to the ICU on 12/29/22 for septic shock due to UTI currently off pressors and transferred to [facility] 4/16.  Assessment and Plan: No notes have been filed under this hospital service. Service: Hospitalist     {Tip this will not be part of the note when signed Body mass index is 24.5 kg/m. , ,  Active Pressure Injury/Wound(s)     Pressure Ulcer  Duration          Pressure Injury 12/31/22 Buttocks Bilateral Stage 2 -  Partial thickness loss of dermis presenting as a shallow open injury with a red, pink wound bed without slough. 5 days            (Optional):26781}  {(NOTE) Pain control PDMP Statment (Optional):26782} Consultants: *** Procedures performed: ***  Disposition: {Plan; Disposition:26390} Diet recommendation:  {Diet_Plan:26776} DISCHARGE MEDICATION: Allergies as of 01/06/2023       Reactions   Azithromycin Anaphylaxis   throat closes   Morphine    psychosis   Lorazepam    extremely hostile   Meperidine    Morphine Sulfate    Psychotic symptoms     Med Rec must be completed prior to using this [facility]***       Discharge Exam: Filed Weights   12/30/22 0500 12/31/22 0500 01/01/23 0700  Weight: 91.5 kg 91.9 kg 93.7 kg   ***  Condition at discharge: {DC Condition:26389}  The results of significant diagnostics from this hospitalization (including imaging, microbiology, ancillary and laboratory) are listed below for reference.   Imaging Studies: CT ABDOMEN PELVIS WO CONTRAST  Result Date: 01/03/2023 CLINICAL DATA:  Bowel obstruction suspected EXAM: CT ABDOMEN AND PELVIS WITHOUT CONTRAST TECHNIQUE: Multidetector CT imaging of the abdomen and pelvis was performed following the standard protocol without IV contrast. RADIATION DOSE REDUCTION: This exam was performed according to the departmental dose-optimization program which includes automated exposure control, adjustment of the mA and/or kV according to patient size and/or use of iterative reconstruction technique. COMPARISON:  CT chest abdomen pelvis 12/31/2022 FINDINGS: Lower Chest: Mild left basilar opacity Hepatobiliary: Diffuse hypoattenuation of the liver relative to the spleen suggests hepatic steatosis. No focal liver lesion or biliary dilatation. The gallbladder is normal. Pancreas: Normal pancreas. No ductal dilatation or peripancreatic fluid collection. Spleen: Normal. Adrenals/Urinary Tract: The adrenal  glands are normal. No hydronephrosis, nephroureterolithiasis or solid renal mass. The urinary bladder is normal for degree of distention  Stomach/Bowel: There is no hiatal hernia. Normal duodenal course and caliber. No small bowel dilatation or inflammation. No focal colonic abnormality. Normal appendix. Vascular/Lymphatic: Normal course and caliber of the major abdominal vessels. No abdominal or pelvic lymphadenopathy. Reproductive: Normal prostate size with symmetric seminal vesicles. Other: Fat infiltration at the site of both ischial tuberosities. Musculoskeletal: Chronic dislocation of the right hip with chronic deformity of the right femoral head. Long segment postsurgical changes with spinal hardware in the lower thoracic and lumbar spine. 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 IMPRESSION: 1. No acute abnormality of the abdomen or pelvis. 2. Hepatic steatosis. 3. Chronic dislocation of the right hip with chronic deformity of the right femoral head. Electronically Signed   By: Deatra Robinson M.D.   On: 01/03/2023 02:06   CT CHEST ABDOMEN PELVIS W CONTRAST  Result Date: 12/31/2022 CLINICAL DATA:  Pyelonephritis suspected.  Complicated history. EXAM: CT CHEST, ABDOMEN, AND PELVIS WITH CONTRAST TECHNIQUE: Multidetector CT imaging of the chest, abdomen and pelvis was performed following the standard protocol during bolus administration of intravenous contrast. RADIATION DOSE REDUCTION: This exam was performed according to the departmental dose-optimization program which includes automated exposure control, adjustment of the mA and/or kV according to patient size and/or use of iterative reconstruction technique. CONTRAST:  OMNIPAQUE IOHEXOL 300 MG/ML  SOLN COMPARISON:  05/29/2022 abdominal CT. FINDINGS: CT CHEST FINDINGS Cardiovascular: Normal heart size. No pericardial effusion. Left-sided central line with tip at the left brachiocephalic vein. No acute vascular finding. Mediastinum/Nodes: Negative for mass or adenopathy. Lungs/Pleura: Patchy airspace opacity in the dependent left lung at the upper and lower lobes. No edema,  effusion, or pneumothorax. Musculoskeletal: No acute finding. Extensive lumbar spine fusion with prior T9 and L2 fracture. Advanced adjacent segment facet degeneration and ligamentous thickening at T5-6. arthrodesis is solid. CT ABDOMEN PELVIS FINDINGS Hepatobiliary: Suspect hepatic steatosis but postcontrast assessment. No focal lesion.No evidence of biliary obstruction or stone. Pancreas: Unremarkable. Spleen: Unremarkable. Adrenals/Urinary Tract: Negative adrenals. No hydronephrosis or stone. Unremarkable bladder with located Foley catheter. Stomach/Bowel:  No obstruction. No visible inflammation. Vascular/Lymphatic: No acute vascular abnormality. No mass or adenopathy. Reproductive:No pathologic findings. Other: No ascites or pneumoperitoneum. Musculoskeletal: Thoracolumbar postoperative changes for trauma as noted above. 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 This likely accounts for hazy appearance of the spinal canal at L5-S1, which was also seen previously. Remote right femoral head fracture with synovitis and joint effusion. There is uncovering of the femoral head with fragmentation. Left hip osteoarthritis is well. Subcutaneous fat infiltration over the right more than left ischial tuberosity, likely pressure related and stable from prior. IMPRESSION: 1. Mild pneumonia in the left lung. 2. No visible pyelonephritis or other acute intra-abdominal finding. 3. Chronic findings as described. Electronically Signed   By: Tiburcio Pea M.D.   On: 12/31/2022 10:05   DG Chest Port 1 View  Result Date: 12/30/2022 CLINICAL DATA:  Central line placement EXAM: PORTABLE CHEST 1 VIEW COMPARISON:  Chest x-ray dated t 10/18/2019 FINDINGS: LEFT IJ central line in place with tip positioned at the upper SVC. Cardiomegaly with central pulmonary vascular congestion. No pleural effusion or pneumothorax is seen. IMPRESSION: LEFT IJ central line in place with tip positioned at the upper SVC. Consider advancing  approximately 5 cm for more optimal radiographic positioning. No pneumothorax. Electronically Signed   By: Bary Richard M.D.   On:  12/30/2022 17:22    Microbiology: Results for orders placed or performed during the hospital encounter of 12/29/22  Urine Culture     Status: None   Collection Time: 12/29/22 10:16 PM   Specimen: Urine, Random  Result Value Ref Range Status   Specimen Description   Final    URINE, RANDOM Performed at [facility], [address] [place], [place] [address]    Special Requests   Final    NONE Reflexed from [id number] Performed at [facility], [address] [place], [place] [address]    Culture   Final    NO GROWTH Performed at [facility], [address], [place], [place] [address]    Report Status 12/31/2022 FINAL  Final  Culture, blood (routine x 2)     Status: None   Collection Time: 12/30/22  2:27 AM   Specimen: BLOOD  Result Value Ref Range Status   Specimen Description BLOOD BLOOD RIGHT ARM  Final   Special Requests   Final    BOTTLES DRAWN AEROBIC AND ANAEROBIC Blood Culture adequate volume   Culture   Final    NO GROWTH 5 DAYS Performed at [facility], [address] [place], [place] [address]    Report Status 01/04/2023 FINAL  Final  Culture, blood (routine x 2)     Status: None   Collection Time: 12/30/22  2:42 AM   Specimen: BLOOD  Result Value Ref Range Status   Specimen Description BLOOD BLOOD RIGHT HAND  Final   Special Requests   Final    BOTTLES DRAWN AEROBIC AND ANAEROBIC Blood Culture results may not be optimal due to an inadequate volume of blood received in culture bottles   Culture   Final    NO GROWTH 5 DAYS Performed at [facility], [address] [place], [place] [address]    Report Status 01/04/2023 FINAL  Final    Labs: CBC: Recent Labs  Lab 12/31/22 0512 01/01/23 0529 01/02/23 0509 01/03/23 0429 01/04/23 0407 01/05/23 0539 01/06/23 0543  WBC 16.8*   < >  7.8 4.3 4.9 6.6 5.4  NEUTROABS 14.2*  --  6.3  --   --   --   --   HGB 12.7*   < > 11.0* 10.9* 11.0* 11.5* 11.6*  HCT 37.9*   < > 33.3* 33.8* 33.8* 35.0* 36.0*  MCV 84.4   < > 86.7 85.6 85.4 83.7 84.9  PLT 77*   < > 49* 59* 69* 97* 142*   < > = values in this interval not displayed.   Basic Metabolic Panel: Recent Labs  Lab 12/30/22 1944 12/30/22 1944 12/31/22 0512 01/01/23 0529 01/02/23 0509 01/03/23 0429 01/04/23 0407 01/05/23 0539 01/06/23 0543  NA 139  --  142 144 142 141 139 137 140  K 4.0  --  4.3 4.5 4.2 3.8 4.1 3.7 4.0  CL 109  --  109 110 111 108 105 100 104  CO2 25  --  28 31 25 26 30 29 28   GLUCOSE 88  --  127* 84 83 75 86 90 91  BUN 29*  --  26* 26* 18 12 10 9 11   CREATININE 1.20  --  1.28* 0.84 0.66 0.68 0.64 0.66 0.59*  CALCIUM 7.8*  --  8.3* 8.4* 8.1* 8.2* 8.7* 8.9 8.4*  MG  --    < > 2.0 2.1  --  1.7 1.5* 1.6* 2.1  PHOS 3.7  --  3.0  2.6  --   --  2.5  --   --    < > = values in this interval not displayed.   Liver Function Tests: Recent Labs  Lab 01/02/23 0509 01/03/23 0429 01/04/23 0407 01/05/23 0539 01/06/23 0543  AST 60* 54* 71* 72* 57*  ALT 144* 122* 123* 117* 100*  ALKPHOS 157* 164* 158* 140* 117  BILITOT 1.5* 1.7* 1.5* 1.5* 1.0  PROT 5.1* 5.6* 5.8* 6.2* 6.2*  ALBUMIN 3.0* 3.2* 3.2* 3.4* 3.2*   CBG: Recent Labs  Lab 01/05/23 1136 01/05/23 1821 01/05/23 2015 01/06/23 0013 01/06/23 0411  GLUCAP 145* 130* 107* 117* 118*    Discharge time spent: {LESS THAN/GREATER ZOXW:96045} 30 minutes.  Signed: Pennie Banter, DO [facility] 01/06/2023
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
## 2023-01-06 NOTE — Progress Notes (Signed)
Progress Note   Patient: Justin [name] ZOX:[id number] DOB: Oct 06, 1980 DOA: 12/29/2022     8 DOS: the patient was seen and examined on 01/06/2023   Brief hospital course: 42 y.o male with significant PMH of  Hypothyroidism, Asthma, incomplete low thoracic/lumbar Spinal cord injury, T7-T12, Spasticity 2/2 incomplete paraplegia, Neurogenic Bladder 2/2 SCI, chronic UTIs due to neurogenic bladder, chronic leg spasms, chronic pain syndrome, chronic constipation, Hypesthesia, Anxiety and Depression who presented to the ED with chief complaints of bilateral leg spasm, chills and rigor. Patient reporting intermittent uncontrolled leg spasms without clear cause for his worsening spasticity. He has chronic pain/anxiety on high doses of methadone, oxycodone, alprazolam, Baclofen, Gabapentin and tizanidine at home.    Pt was initially admitted to the ICU on 12/29/22 for septic shock due to UTI currently off pressors and transferred to [facility] 4/16.  Assessment and Plan:  #Septic Shock with endorgan dysfunction-present on admission #Atypical Pneumonia - POA On admission patient met SIRS criteria: HR 137, RR 34 and hypotensive requiring vasopressor support.  BP was as low as 77/41. Does have dysautonomia, patient septic secondary to UTI and community-acquired pneumonia Chest CT suggests possible pneumonia Required Levophed in the ICU, weaned off and transitioned to midodrine. --BP's stable on low dose midodrine, will stop it and monitor BP closely --Initially on IV Cefepime and DOxycycline --Completed course with PO doxycycline and Augmentin --Urine culture result -- no growth --Blood cultures -- negative, final  #Nausea/Vomiting -- appears to be chronic issue, with antiemetics on home med list. Pt acknowledges chronic intermittent N/V, uses phenergan at home. #LFT's elevated in setting of sepsis, improving.  CT abdomen/pelvis negative for acute findings --PRN anti-emetics --D/C IV phenergan,  resume home PO phenergan PRN since tolerating PO intake now  #Hypoglycemia -- due to above, but now tolerating PO intake well. --Now off D10w  --Hypoglycemia protocol --AM cortisol was low at 4.8.  Will check ACTH stim test tomorrow --Continue to monitor CBG's off dextrose fluids  #Spasticity - spasms uncontrolled, exacerbating his pain #Paraplegia #Autonomic Dysreflexia Patient presented with increased leg spasms in the setting of acute infection. He is on multiple medications for spasticity and pain, including methadone, oxycodone, alprazolam, Tizanidine, and baclofen at home. He is also on amphetamine-dextroamphetamine. Gabapentin has been added to patient's regimen this hospitalization, and home meds have been continued.    Plan -home Adderall BID -home Baclofen 20 mg QID  -home methadone 20 mg QID  -hometizanidine 8 mg QID -Oxycodone PRN - increased q6h to q4h for now  -gabapentin PRN  -resume home ibuprofen - pt requested it be scheduled to help exacerbated dysreflexia sx's -IV dilaudid PRN -- he requested a PCA for his spasms, but is too sedated, would be more risk than benefit. -Patient will need outpatient neurology / SCI follow-up   #Lower Extremity / Pedal Edema - from third-spacing of fluids --Lasix 20 mg IV x 1 given 4/20 --Monitor.  Diuresis PRN.  #Neurogenic Bladder #AKI --Continue oxybutynin and Flovoxat --Intermittent self-caths --Continue to monitor renal function --Avoid nephrotoxic medications   #Hypothyroidism  --Continue levothyroxine   Acute transaminitis likely secondary to septic shock -Improving --Monitor LFTs  Thrombocytopenia - POA Likely due to sepsis/infection.  Platelets 121 on admission >> nadir on 49 k on 4/17 Improving, Plt 59k --Monitor CBC  Pressure Injury of Skin - POA I agree with the wound description/s as outlined below. Images taken of wounds on 4/21 - below in exam and available in chart --Frequent repositioning, offload  pressure areas --  Wound care consulted --Monitor for signs of infection Pressure Injury 12/31/22 Buttocks Bilateral Stage 2 -  Partial thickness loss of dermis presenting as a shallow open injury with a red, pink wound bed without slough. (Active)  12/31/22 2000  Location: Buttocks  Location Orientation: Bilateral  Staging: Stage 2 -  Partial thickness loss of dermis presenting as a shallow open injury with a red, pink wound bed without slough.  Wound Description (Comments):   Present on Admission: Yes         Subjective: Pt was seen with wife at bedside this AM. He is more awake and alert today.  Still having uncontrolled spasms mostly in legs, reports uncontrolled pain and requests more medication. He has a red spot in the left hand webbing between thumb and forefinger, shows pictures of prior similar issue at that time was large blister in the area.  He requests biopsy, but we discussed risk of seeding infection deeper if there is skin infection.  Discussed topical antibiotics and close monitoring. He can no longer actively extend his left wrist, states left thumb is numb, and has no coordination in moving the left arm.  This is all new onset.  No other new neurologic symptoms or deficits.   Physical Exam: Vitals:   01/05/23 1326 01/05/23 1819 01/06/23 0019 01/06/23 0817  BP: 125/76 (!) 106/52 (!) 101/52 (!) 147/87  Pulse: 95 76 70 (!) 106  Resp: Temp: 97.7 F (36.5 C) 97.9 F (36.6 C) (!) 97.4 F (36.3 C) 98.4 F (36.9 C)  TempSrc:      SpO2: 95% 94% 96% 96%  Weight:      Height:       General exam: awake, alert, no acute distress HEENT: moist mucus membranes, hearing grossly normal  Respiratory system: CTAB, no wheezes or rhonchi, on room air, normal respiratory effort Cardiovascular system: normal S1/S2, RRR, tense b/l pedal edema.   Central nervous system: A&O x4. B/l lower extremity paralysis Extremities: b/l feet in offloading boots, BLE  pedal  edema Skin: images below - left hand (appears erythematous, no blister but small white area that appears from friction) and sacral wound/s, healed surgical scars on midline low back and posterior waist Psychiatry: normal mood and affect, judgment and insight appear normal           Data Reviewed:  Notable labs ---Cr 0.59, Ca 8.4, albumin 3.2, AST 57, ALT 100, Tprotein 6.2, Hbg 11.6, platelets 142k.      Family Communication: wife at bedside.    Disposition: Status is: Inpatient Remains inpatient appropriate because: new onset   Planned Discharge Destination: Home    Time spent: 46 minutes  Author: Pennie Banter, DO 01/06/2023 12:28 PM  For on call review [email or web address].

## 2023-01-07 ENCOUNTER — Other Ambulatory Visit: Payer: Self-pay | Admitting: *Deleted

## 2023-01-07 ENCOUNTER — Telehealth: Payer: Self-pay

## 2023-01-07 ENCOUNTER — Other Ambulatory Visit: Payer: Self-pay | Admitting: Family Medicine

## 2023-01-07 DIAGNOSIS — N390 Urinary tract infection, site not specified: Secondary | ICD-10-CM | POA: Diagnosis not present

## 2023-01-07 DIAGNOSIS — A419 Sepsis, unspecified organism: Secondary | ICD-10-CM | POA: Diagnosis not present

## 2023-01-07 DIAGNOSIS — G894 Chronic pain syndrome: Secondary | ICD-10-CM

## 2023-01-07 DIAGNOSIS — F988 Other specified behavioral and emotional disorders with onset usually occurring in childhood and adolescence: Secondary | ICD-10-CM

## 2023-01-07 DIAGNOSIS — Z1630 Resistance to unspecified antimicrobial drugs: Secondary | ICD-10-CM

## 2023-01-07 LAB — GLUCOSE, CAPILLARY
Glucose-Capillary: 93 mg/dL (ref 70–99)
Glucose-Capillary: 81 mg/dL (ref 70–99)
Glucose-Capillary: 118 mg/dL — ABNORMAL HIGH (ref 70–99)

## 2023-01-07 LAB — BASIC METABOLIC PANEL
Anion gap: 8 (ref 5–15)
BUN: 10 mg/dL (ref 6–20)
CO2: 27 mmol/L (ref 22–32)
Calcium: 9 mg/dL (ref 8.9–10.3)
Chloride: 104 mmol/L (ref 98–111)
Creatinine, Ser: 0.62 mg/dL (ref 0.61–1.24)
GFR, Estimated: >60 mL/min (ref >60)
Glucose, Bld: 91 mg/dL (ref 70–99)
Potassium: 4.2 mmol/L (ref 3.5–5.1)
Sodium: 139 mmol/L (ref 135–145)

## 2023-01-07 LAB — CBC
HCT: 37.1 % — ABNORMAL LOW (ref 39.0–52.0)
Hemoglobin: 11.8 g/dL — ABNORMAL LOW (ref 13.0–17.0)
MCH: 27.1 pg (ref 26.0–34.0)
MCHC: 31.8 g/dL (ref 30.0–36.0)
MCV: 85.1 fL (ref 80.0–100.0)
Platelets: 166 10*3/uL (ref 150–400)
RBC: 4.36 MIL/uL (ref 4.22–5.81)
RDW: 14.5 % (ref 11.5–15.5)
WBC: 5.1 10*3/uL (ref 4.0–10.5)
nRBC: 0 % (ref 0.0–0.2)

## 2023-01-07 LAB — ACTH STIMULATION, 3 TIME POINTS
Cortisol, 30 Min: 16.4 ug/dL
Cortisol, 60 Min: 21.5 ug/dL
Cortisol, Base: 2.4 ug/dL

## 2023-01-07 LAB — MAGNESIUM: Magnesium: 2.1 mg/dL (ref 1.7–2.4)

## 2023-01-07 MED ORDER — BACITRACIN ZINC 500 UNIT/GM EX OINT: TOPICAL_OINTMENT | Freq: Two times a day (BID) | CUTANEOUS | 0 refills | Status: DC

## 2023-01-07 MED ORDER — HEPARIN SOD (PORK) LOCK FLUSH 10 UNIT/ML IV SOLN
10.0000 [IU] | Freq: Once | INTRAVENOUS | Status: DC
  Filled 2023-01-07: qty 1

## 2023-01-07 MED ORDER — FLAVOXATE HCL 100 MG PO TABS
100.0000 mg | ORAL_TABLET | Freq: Three times a day (TID) | ORAL | 1 refills | Status: AC | PRN
Start: 2023-01-07 — End: ?

## 2023-01-07 MED ORDER — BACLOFEN 20 MG PO TABS: 20.0000 mg | ORAL_TABLET | Freq: Four times a day (QID) | ORAL | 0 refills | Status: DC | PRN

## 2023-01-07 MED ORDER — MEDIHONEY WOUND/BURN DRESSING EX PSTE: 1.0000 | PASTE | Freq: Every day | CUTANEOUS | 1 refills | Status: AC

## 2023-01-07 MED ORDER — BACLOFEN 10 MG PO TABS
20.0000 mg | ORAL_TABLET | Freq: Four times a day (QID) | ORAL | Status: DC | PRN
  Administered 2023-01-07: 20 mg via ORAL
  Filled 2023-01-07: qty 2

## 2023-01-07 MED ORDER — MINERAL OIL PO OIL
30.0000 mL | TOPICAL_OIL | Freq: Every day | ORAL | Status: DC | PRN
  Filled 2023-01-07: qty 30

## 2023-01-07 NOTE — Progress Notes (Signed)
OT Cancellation Note  Patient Details Name: Costas Sena MRN: [id number] DOB: Mar 18, 1981   Cancelled Treatment:    Reason Eval/Treat Not Completed: Other (comment). Consult received, chart reviewed. OT consult for splinting. Notified MD we do not have custom splinting available, recommend outpatient OT hand therapy upon discharge for further assessment of splinting needs.   Arman Filter., MPH, MS, OTR/L ascom [phone] 01/07/23, 9:29 AM

## 2023-01-07 NOTE — Telephone Encounter (Signed)
He can have any SameDay slot next week.

## 2023-01-07 NOTE — Plan of Care (Signed)
  Problem: Nutrition: Goal: Adequate nutrition will be maintained Outcome: Progressing   Problem: Activity: Goal: Risk for activity intolerance will decrease Outcome: Progressing   Problem: Elimination: Goal: Will not experience complications related to bowel motility Outcome: Progressing   Problem: Safety: Goal: Ability to remain free from injury will improve Outcome: Progressing   Problem: Pain Managment: Goal: General experience of comfort will improve Outcome: Progressing

## 2023-01-07 NOTE — Care Management Important Message (Signed)
Important Message  Patient Details  Name: Justin [name] MRN: [id number] Date of Birth: 1981-06-15   Medicare Important Message Given:  Yes     Olegario Messier A Maurya Nethery 01/07/2023, 2:41 PM

## 2023-01-07 NOTE — TOC Transition Note (Signed)
Transition of Care [facility]) - CM/SW Discharge Note   Patient Details  Name: Justin [name] MRN: [id number] Date of Birth: 17-Mar-1981  Transition of Care [facility]) CM/SW Contact:  Garret Reddish, RN Phone Number: 01/07/2023, 3:36 PM   Clinical Narrative:  Chart reviewed.  Patient will be be a discharge for today.  Noted that [facility] has been arranged for Home Health RN and in home aide.  I have informed Becky Sax with [facility] that patient will be a discharge for today. I have informed staff nurse of the above information.      Final next level of care: Home w Home Health Services Barriers to Discharge: No Barriers Identified   Patient Goals and CMS Choice   Choice offered to / list presented to : Patient  Discharge Placement                      Patient and family notified of of transfer: 01/07/23  Discharge Plan and Services Additional resources added to the After Visit Summary for                  DME Arranged:  (Patient has manual wheelchair, and specialized air mattress)         HH Arranged: RN, Nurse's Aide HH Agency: [facility] Date [facility] Agency Contacted: 01/06/23   Representative spoke with at [facility] Agency: Becky Sax  Social Determinants of Health (SDOH) Interventions SDOH Screenings   Food Insecurity: No Food Insecurity (10/08/2022)  Housing: Low Risk  (10/08/2022)  Transportation Needs: No Transportation Needs (10/08/2022)  Utilities: Not At Risk (10/03/2022)  Alcohol Screen: Low Risk  (01/10/2022)  Depression (PHQ2-9): Medium Risk (01/10/2022)  Financial Resource Strain: Low Risk  (01/10/2022)  Social Connections: Socially Isolated (01/10/2022)  Stress: No Stress Concern Present (01/10/2022)  Tobacco Use: Medium Risk (11/26/2022)     Readmission Risk Interventions     No data to display

## 2023-01-07 NOTE — Telephone Encounter (Signed)
Patient scheduled for 01/21/23 at 4:20

## 2023-01-07 NOTE — Telephone Encounter (Signed)
Copied from CRM [id number]. Topic: Appointment Scheduling - Scheduling Inquiry for Clinic >> Jan 07, 2023  2:53 PM Patsy Lager T wrote: Reason for CRM: patient has visit type scheduling modifier preventing [facility] from scheduling the f/u visit. Beth from [facility] called said patient will be discharged today and will need f/u visit in 1-2 weeks. Please advise patient

## 2023-01-07 NOTE — Progress Notes (Signed)
Reviewed discharge instructions. Pt and spouse verbalized understanding. PT discharged with all personal belongings. Staff walked pt out. Pt transported to home via family car.

## 2023-01-08 ENCOUNTER — Telehealth: Payer: Self-pay | Admitting: *Deleted

## 2023-01-08 ENCOUNTER — Ambulatory Visit: Payer: Self-pay | Admitting: *Deleted

## 2023-01-08 NOTE — Chronic Care Management (AMB) (Signed)
   01/08/2023  Abundio Teuscher 1981-09-12 [id number]   Status changed to previously enrolled for CCM.  Irving Shows [facility], BSN RN Case Manage [phone]

## 2023-01-08 NOTE — Transitions of Care (Post Inpatient/ED Visit) (Signed)
   01/08/2023  Name: Justin [name] MRN: [id number] DOB: Jun 15, 1981  Today's TOC FU Call Status: Today's TOC FU Call Status:: Unsuccessul Call (1st Attempt) Unsuccessful Call (1st Attempt) Date: 01/08/23  Attempted to reach the patient regarding the most recent Inpatient/ED visit.  Follow Up Plan: Additional outreach attempts will be made to reach the patient to complete the Transitions of Care (Post Inpatient/ED visit) call.   Gean Maidens BSN RN [facility] Care Management [phone]

## 2023-01-09 ENCOUNTER — Encounter: Payer: Self-pay | Admitting: Family Medicine

## 2023-01-09 ENCOUNTER — Telehealth: Payer: Self-pay | Admitting: *Deleted

## 2023-01-09 DIAGNOSIS — G894 Chronic pain syndrome: Secondary | ICD-10-CM

## 2023-01-09 NOTE — Transitions of Care (Post Inpatient/ED Visit) (Signed)
   01/09/2023  Name: Justin [name] MRN: [id number] DOB: 11-Jul-1981  Today's TOC FU Call Status: Today's TOC FU Call Status:: Successful TOC FU Call Competed TOC FU Call Complete Date: 01/09/23  Transition Care Management Follow-up Telephone Call Date of Discharge: 01/07/23 Discharge Facility: [facility]) Type of Discharge: Inpatient Admission Primary Inpatient Discharge Diagnosis:: sepsis due to urinary tract infection How have you been since you were released from the hospital?: Better (but still having spasms. Patient is having a lots of pain) Any questions or concerns?: No  Items Reviewed: Did you receive and understand the discharge instructions provided?: Yes Medications obtained and verified?: Yes (Medications Reviewed) Any new allergies since your discharge?: No Dietary orders reviewed?: No Do you have support at home?: Yes People in Home: spouse Name of Support/Comfort Primary Source: [place]  Home Care and Equipment/Supplies: Were Home Health Services Ordered?: NA Any new equipment or medical supplies ordered?: NA  Functional Questionnaire: Do you need assistance with bathing/showering or dressing?: Yes Do you need assistance with meal preparation?: Yes Do you need assistance with eating?: Yes Do you have difficulty maintaining continence: Yes Do you need assistance with getting out of bed/getting out of a chair/moving?: Yes Do you have difficulty managing or taking your medications?: Yes  Follow up appointments reviewed: PCP Follow-up appointment confirmed?: Yes Date of PCP follow-up appointment?: 01/18/23 Follow-up Provider: Dr [name] [facility] Follow-up appointment confirmed?: No Reason Specialist Follow-Up Not Confirmed: Patient has Specialist Provider Number and will Call for Appointment Do you need transportation to your follow-up appointment?: No Do you understand care options if your condition(s) worsen?:  Yes-patient verbalized understanding  SDOH Interventions Today    Flowsheet Row Most Recent Value  SDOH Interventions   Food Insecurity Interventions Intervention Not Indicated  Housing Interventions Intervention Not Indicated  Transportation Interventions Intervention Not Indicated      Interventions Today    Flowsheet Row Most Recent Value  General Interventions   General Interventions Discussed/Reviewed General Interventions Discussed, General Interventions Reviewed, Doctor Visits  Doctor Visits Discussed/Reviewed Doctor Visits Reviewed, Specialist  PCP/Specialist Visits Compliance with follow-up visit       TOC Interventions Today    Flowsheet Row Most Recent Value  TOC Interventions   TOC Interventions Discussed/Reviewed TOC Interventions Discussed, TOC Interventions Reviewed       Gean Maidens BSN RN [facility] [phone]

## 2023-01-10 ENCOUNTER — Telehealth: Payer: Self-pay | Admitting: *Deleted

## 2023-01-10 MED ORDER — OXYCODONE HCL 15 MG PO TABS
15.0000 mg | ORAL_TABLET | ORAL | 0 refills | Status: DC | PRN
Start: 2023-01-10 — End: 2023-02-13

## 2023-01-10 NOTE — Progress Notes (Signed)
  Care Coordination   Note   01/10/2023 Name: Justin [name] MRN: [id number] DOB: [birth year]  Justin [name] is a 42 y.o. year old male who sees Fisher, Demetrios Isaacs, MD for primary care. I reached out to [name] by phone today to offer care coordination services.  Mr. Parke was given information about Care Coordination services today including:   The Care Coordination services include support from the care team which includes your Nurse Coordinator, Clinical Social Worker, or Pharmacist.  The Care Coordination team is here to help remove barriers to the health concerns and goals most important to you. Care Coordination services are voluntary, and the patient may decline or stop services at any time by request to their care team member.   Care Coordination Consent Status: Patient agreed to services and verbal consent obtained.   Follow up plan:  Telephone appointment with care coordination team member scheduled for:  01/21/2023  Encounter Outcome:  Pt. Scheduled from referral   Burman Nieves, [facility] Care Coordination Care Guide Direct Dial: [phone]

## 2023-01-14 ENCOUNTER — Encounter: Payer: Self-pay | Admitting: Family Medicine

## 2023-01-14 ENCOUNTER — Ambulatory Visit (INDEPENDENT_AMBULATORY_CARE_PROVIDER_SITE_OTHER): Payer: Medicare HMO | Admitting: Family Medicine

## 2023-01-14 VITALS — BP 169/83 | HR 106

## 2023-01-14 DIAGNOSIS — R252 Cramp and spasm: Secondary | ICD-10-CM

## 2023-01-14 DIAGNOSIS — L309 Dermatitis, unspecified: Secondary | ICD-10-CM

## 2023-01-14 DIAGNOSIS — G904 Autonomic dysreflexia: Secondary | ICD-10-CM

## 2023-01-14 DIAGNOSIS — G894 Chronic pain syndrome: Secondary | ICD-10-CM | POA: Diagnosis not present

## 2023-01-14 DIAGNOSIS — M62838 Other muscle spasm: Secondary | ICD-10-CM

## 2023-01-14 DIAGNOSIS — S24109S Unspecified injury at unspecified level of thoracic spinal cord, sequela: Secondary | ICD-10-CM

## 2023-01-14 DIAGNOSIS — N390 Urinary tract infection, site not specified: Secondary | ICD-10-CM

## 2023-01-14 DIAGNOSIS — G8220 Paraplegia, unspecified: Secondary | ICD-10-CM

## 2023-01-14 DIAGNOSIS — A419 Sepsis, unspecified organism: Secondary | ICD-10-CM

## 2023-01-14 LAB — POCT URINALYSIS DIPSTICK
Bilirubin, UA: NEGATIVE
Blood, UA: NEGATIVE
Glucose, UA: NEGATIVE
Ketones, UA: NEGATIVE
Leukocytes, UA: NEGATIVE
Nitrite, UA: NEGATIVE
Protein, UA: NEGATIVE
Spec Grav, UA: >=1.03 — AB (ref 1.010–1.025)
Urobilinogen, UA: 2 E.U./dL — AB
pH, UA: 8 (ref 5.0–8.0)

## 2023-01-14 MED ORDER — MUPIROCIN 2 % EX OINT: 1.0000 | TOPICAL_OINTMENT | Freq: Two times a day (BID) | CUTANEOUS | 1 refills | Status: AC

## 2023-01-15 ENCOUNTER — Ambulatory Visit (INDEPENDENT_AMBULATORY_CARE_PROVIDER_SITE_OTHER): Payer: Medicare HMO

## 2023-01-15 VITALS — Ht 76.0 in | Wt 209.0 lb

## 2023-01-15 DIAGNOSIS — Z0000 Encounter for general adult medical examination without abnormal findings: Secondary | ICD-10-CM

## 2023-01-15 LAB — BASIC METABOLIC PANEL
BUN/Creatinine Ratio: 15 (ref 9–20)
BUN: 11 mg/dL (ref 6–24)
CO2: 21 mmol/L (ref 20–29)
Calcium: 8.9 mg/dL (ref 8.7–10.2)
Chloride: 102 mmol/L (ref 96–106)
Creatinine, Ser: 0.72 mg/dL — ABNORMAL LOW (ref 0.76–1.27)
Glucose: 109 mg/dL — ABNORMAL HIGH (ref 70–99)
Potassium: 3.7 mmol/L (ref 3.5–5.2)
Sodium: 139 mmol/L (ref 134–144)
eGFR: 117 mL/min/{1.73_m2} (ref >59)

## 2023-01-15 LAB — CBC
Hematocrit: 38.3 % (ref 37.5–51.0)
Hemoglobin: 12.7 g/dL — ABNORMAL LOW (ref 13.0–17.7)
MCH: 27.4 pg (ref 26.6–33.0)
MCHC: 33.2 g/dL (ref 31.5–35.7)
MCV: 83 fL (ref 79–97)
Platelets: 340 10*3/uL (ref 150–450)
RBC: 4.63 x10E6/uL (ref 4.14–5.80)
RDW: 14.5 % (ref 11.6–15.4)
WBC: 3.7 10*3/uL (ref 3.4–10.8)

## 2023-01-15 LAB — MAGNESIUM: Magnesium: 2.1 mg/dL (ref 1.6–2.3)

## 2023-01-15 NOTE — Progress Notes (Signed)
Established patient visit   Patient: Justin [name]   DOB: 19-Oct-1980   42 y.o. Male  MRN: [id number] Visit Date: 01/14/2023  Today's healthcare provider: Mila Merry, MD   Chief Complaint  Patient presents with   Hospitalization Follow-up   Subjective    HPI  Here today to follow up on hospitalization 4/13 through 4/23 for urosepsis and leg spasm. He reports he no longer having any UTI symptoms, but continues to have legs spasms, although not nearly as severe as when he present to hospital. He was referred to Genice Rouge, MD, physical medicine, but has not yet been contacted to schedule an appointment. He was also referred to Dr. Chriss Driver at [facility] pain [facility] spine clinic several months ago, but has not been able to get appointment, so he would like to be referred to [facility] or [facility] pain clinic.   He has been followed off and on by urology for chronic UTIs, but has not had regular follow up. He prefers to see somewhat that specialized in urinary problems related to spine injuries which would be at tertiary care center.   He also reports recurring blistering lesion in the webbing between first and second digits of left hand. He was prescribed bacitracin when discharged from hospital. Lesion has mostly healed,  but now having chaffing in area where it was applied and on fingers of other hand.   Medications: Outpatient Medications Prior to Visit  Medication Sig   albuterol (VENTOLIN HFA) 108 (90 Base) MCG/ACT inhaler Inhale 2 puffs into the lungs every 6 (six) hours as needed for wheezing or shortness of breath.   alprazolam (XANAX) 2 MG tablet Take 1 tablet (2 mg total) by mouth every 6 (six) hours as needed for anxiety.   amphetamine-dextroamphetamine (ADDERALL) 15 MG tablet Take 1 tablet by mouth 2 (two) times daily.   bacitracin ointment Apply topically 2 (two) times daily.   baclofen (LIORESAL) 20 MG tablet TAKE ONE TABLET FOUR TIMES DAILY (Patient taking  differently: Take 20 mg by mouth 4 (four) times daily.)   baclofen (LIORESAL) 20 MG tablet Take 1 tablet (20 mg total) by mouth 4 (four) times daily as needed for muscle spasms.   docusate sodium (COLACE) 250 MG capsule Take 1 capsule (250 mg total) by mouth 4 (four) times daily as needed.   fesoterodine (TOVIAZ) 8 MG TB24 tablet TAKE 1 TABLET BY MOUTH DAILY   flavoxATE (URISPAS) 100 MG tablet Take 1 tablet (100 mg total) by mouth 3 (three) times daily as needed for bladder spasms.   gabapentin (NEURONTIN) 400 MG capsule Take 400 mg by mouth 4 (four) times daily as needed (spasms).   leptospermum manuka honey (MEDIHONEY) PSTE paste Apply 1 Application topically daily.   levothyroxine (SYNTHROID) 88 MCG tablet TAKE ONE TABLET BY MOUTH EVERY DAY   linaclotide (LINZESS) 145 MCG CAPS capsule Take 145 mcg by mouth daily before breakfast.   methadone (DOLOPHINE) 10 MG tablet Take 2 tablets (20 mg total) by mouth every 6 (six) hours.   Mineral Oil OIL daily.   naproxen sodium (ANAPROX) 550 MG tablet TAKE 1 TABLET BY MOUTH 2 TIMES DAILY WITH A MEAL.   omeprazole (PRILOSEC) 20 MG capsule TAKE 1 CAPSULE BY MOUTH ONCE DAILY   oxybutynin (DITROPAN) 5 MG tablet Take 5 mg by mouth daily as needed.   oxyCODONE (ROXICODONE) 15 MG immediate release tablet Take 1 tablet (15 mg total) by mouth every 4 (four) hours as needed for  pain. Take one tablet every 4-5 hours as needed, no more than 5 in a day   promethazine (PHENERGAN) 25 MG suppository PLACE 1 SUPPOSITORY RECTALLY EVERY 6 HOURS AS NEEDED FOR NAUSEA OR VOMITING (Patient taking differently: Place 25 mg rectally every 6 (six) hours as needed for nausea or vomiting.)   promethazine (PHENERGAN) 25 MG tablet TAKE 1 TABLET BY MOUTH EVERY 4 HOURS AS NEEDED FOR NAUSEA AND VOMITING   sildenafil (REVATIO) 20 MG tablet Take 3 to 5 tablets two hours before intercouse on an empty stomach.  Do not take with nitrates.   tiZANidine (ZANAFLEX) 4 MG tablet TAKE 2 TABLETS BY  MOUTH 4 TIMES DAILY   No facility-administered medications prior to visit.    Review of Systems  Constitutional:  Negative for appetite change, chills and fever.  Respiratory:  Negative for chest tightness, shortness of breath and wheezing.   Cardiovascular:  Negative for chest pain and palpitations.  Gastrointestinal:  Negative for abdominal pain, nausea and vomiting.       Objective    BP (!) 169/83 (BP Location: Right Arm, Patient Position: Sitting, Cuff Size: Normal)   Pulse (!) 106   SpO2 97%    Physical Exam    General: Appearance:    Well developed, well nourished male sitting in wheelchair in no acute distress  Eyes:    PERRL, conjunctiva/corneas clear, EOM's intact       Lungs:     Clear to auscultation bilaterally, respirations unlabored  Heart:    Tachycardic. Normal rhythm. No murmurs, rubs, or gallops.    MS:   All extremities are intact.    Neurologic:   Awake, alert, oriented x 3. No apparent focal neurological defect.         Results for orders placed or performed in visit on 01/14/23  POCT urinalysis dipstick  Result Value Ref Range   Color, UA yellow    Clarity, UA clear    Glucose, UA Negative Negative   Bilirubin, UA neg    Ketones, UA neg    Spec Grav, UA >=1.030 (A) 1.010 - 1.025   Blood, UA neg    pH, UA 8.0 5.0 - 8.0   Protein, UA Negative Negative   Urobilinogen, UA 2.0 (A) 0.2 or 1.0 E.U./dL   Nitrite, UA neg    Leukocytes, UA Negative Negative   Appearance     Odor      Assessment & Plan     1. Sepsis due to urinary tract infection (HCC) No sign infection on u/a today.   - CBC - Basic metabolic panel - Magnesium  He needs to establish and have regular follow up with urologist. Has seen local urologist and [facility] urologists sporadically over the years. He will call to let me know if he needs new referral.   2. Spasticity Persistent, but less severe since UTI treated. Likely has autonomic dysreflexia. Will work on getting into pain  clinic at [facility] or [facility]  3. Dermatitis Improved but not resolved with bacitracin, now with chaffing which he thinks may be caused by the bacitracin cream. Change to  - mupirocin ointment (BACTROBAN) 2 %; Apply 1 Application topically 2 (two) times daily.  Dispense: 22 g; Refill: 1         Mila Merry, MD  [facility] [phone] (phone) [phone] (fax)  [facility]

## 2023-01-15 NOTE — Progress Notes (Signed)
I connected with  [name] on 01/15/23 by a audio enabled telemedicine application and verified that I am speaking with the correct person using two identifiers.  Patient Location: Home  Provider Location: Office/Clinic  I discussed the limitations of evaluation and management by telemedicine. The patient expressed understanding and agreed to proceed.  Subjective:   Justin [name] is a 42 y.o. male who presents for Medicare Annual/Subsequent preventive examination.  Review of Systems     Cardiac Risk Factors include: sedentary lifestyle;male gender     Objective:    Today's Vitals   01/15/23 1304  Weight: 209 lb (94.8 kg)  Height: 6\' 4"  (1.93 m)  PainSc: 7    Body mass index is 25.44 kg/m.     01/15/2023    2:00 PM 10/03/2022    8:00 AM 01/10/2022    3:40 PM 10/17/2019    8:09 PM 02/19/2015    8:44 PM  Advanced Directives  Does Patient Have a Medical Advance Directive? No No No Yes No  Type of Advance Directive    Healthcare Power of Attorney   Would patient like information on creating a medical advance directive?  No - Patient declined No - Patient declined  Yes - Educational materials given    Current Medications (verified) Outpatient Encounter Medications as of 01/15/2023  Medication Sig   albuterol (VENTOLIN HFA) 108 (90 Base) MCG/ACT inhaler Inhale 2 puffs into the lungs every 6 (six) hours as needed for wheezing or shortness of breath.   alprazolam (XANAX) 2 MG tablet Take 1 tablet (2 mg total) by mouth every 6 (six) hours as needed for anxiety.   amphetamine-dextroamphetamine (ADDERALL) 15 MG tablet Take 1 tablet by mouth 2 (two) times daily.   bacitracin ointment Apply topically 2 (two) times daily.   baclofen (LIORESAL) 20 MG tablet TAKE ONE TABLET FOUR TIMES DAILY (Patient taking differently: Take 20 mg by mouth 4 (four) times daily.)   baclofen (LIORESAL) 20 MG tablet Take 1 tablet (20 mg total) by mouth 4 (four) times daily as needed  for muscle spasms.   docusate sodium (COLACE) 250 MG capsule Take 1 capsule (250 mg total) by mouth 4 (four) times daily as needed.   fesoterodine (TOVIAZ) 8 MG TB24 tablet TAKE 1 TABLET BY MOUTH DAILY   flavoxATE (URISPAS) 100 MG tablet Take 1 tablet (100 mg total) by mouth 3 (three) times daily as needed for bladder spasms.   gabapentin (NEURONTIN) 400 MG capsule Take 400 mg by mouth 4 (four) times daily as needed (spasms).   leptospermum manuka honey (MEDIHONEY) PSTE paste Apply 1 Application topically daily.   levothyroxine (SYNTHROID) 88 MCG tablet TAKE ONE TABLET BY MOUTH EVERY DAY   linaclotide (LINZESS) 145 MCG CAPS capsule Take 145 mcg by mouth daily before breakfast.   methadone (DOLOPHINE) 10 MG tablet Take 2 tablets (20 mg total) by mouth every 6 (six) hours.   Mineral Oil OIL daily.   mupirocin ointment (BACTROBAN) 2 % Apply 1 Application topically 2 (two) times daily.   naproxen sodium (ANAPROX) 550 MG tablet TAKE 1 TABLET BY MOUTH 2 TIMES DAILY WITH A MEAL.   omeprazole (PRILOSEC) 20 MG capsule TAKE 1 CAPSULE BY MOUTH ONCE DAILY   oxybutynin (DITROPAN) 5 MG tablet Take 5 mg by mouth daily as needed.   oxyCODONE (ROXICODONE) 15 MG immediate release tablet Take 1 tablet (15 mg total) by mouth every 4 (four) hours as needed for pain. Take one tablet every 4-5 hours as  needed, no more than 5 in a day   promethazine (PHENERGAN) 25 MG suppository PLACE 1 SUPPOSITORY RECTALLY EVERY 6 HOURS AS NEEDED FOR NAUSEA OR VOMITING (Patient taking differently: Place 25 mg rectally every 6 (six) hours as needed for nausea or vomiting.)   promethazine (PHENERGAN) 25 MG tablet TAKE 1 TABLET BY MOUTH EVERY 4 HOURS AS NEEDED FOR NAUSEA AND VOMITING   sildenafil (REVATIO) 20 MG tablet Take 3 to 5 tablets two hours before intercouse on an empty stomach.  Do not take with nitrates.   tiZANidine (ZANAFLEX) 4 MG tablet TAKE 2 TABLETS BY MOUTH 4 TIMES DAILY   No facility-administered encounter medications on  file as of 01/15/2023.    Allergies (verified) Azithromycin, Morphine, Lorazepam, Meperidine, and Morphine sulfate   History: Past Medical History:  Diagnosis Date   Arthritis    Asthma    Chronic anxiety    Chronic depression    Chronic pain syndrome    secondary to fracture T9 and L3 with extensive spinal surgeery   Postoperative wound infection of right hip 2012   Rheumatic fever    Past Surgical History:  Procedure Laterality Date   BACK SURGERY  2000   Fracture T9 and L3 with paraplegia   Family History  Problem Relation Age of Onset   Other Mother    Hypertension Father    Gout Father    Prostate cancer Neg Hx    Kidney cancer Neg Hx    Social History   Socioeconomic History   Marital status: Married    Spouse name: Not on file   Number of children: Not on file   Years of education: Not on file   Highest education level: Not on file  Occupational History   Occupation: unemployed  Tobacco Use   Smoking status: Former    Passive exposure: Past   Smokeless tobacco: Never   Tobacco comments:    Using Nicorette Gum  Substance and Sexual Activity   Alcohol use: Yes    Alcohol/week: 0.0 standard drinks of alcohol    Comment: occasionaly-rare   Drug use: No   Sexual activity: Not on file  Other Topics Concern   Not on file  Social History Narrative   Not on file   Social Determinants of Health   Financial Resource Strain: Medium Risk (01/15/2023)   Overall Financial Resource Strain (CARDIA)    Difficulty of Paying Living Expenses: Somewhat hard  Food Insecurity: No Food Insecurity (01/15/2023)   Hunger Vital Sign    Worried About Running Out of Food in the Last Year: Never true    Ran Out of Food in the Last Year: Never true  Transportation Needs: No Transportation Needs (01/15/2023)   PRAPARE - Administrator, Civil Service (Medical): No    Lack of Transportation (Non-Medical): No  Physical Activity: Not on file  Stress: Stress Concern  Present (01/15/2023)   [employer] of Occupational Health - Occupational Stress Questionnaire    Feeling of Stress : To some extent  Social Connections: Socially Isolated (01/15/2023)   Social Connection and Isolation Panel [NHANES]    Frequency of Communication with Friends and Family: Twice a week    Frequency of Social Gatherings with Friends and Family: Never    Attends Religious Services: Never    Database administrator or Organizations: No    Attends Banker Meetings: Never    Marital Status: Married    Tobacco Counseling Counseling given: Not Answered  Tobacco comments: Using Nicorette Gum   Clinical Intake:  Pre-visit preparation completed: Yes  Pain Score: 7  Pain Type: Chronic pain Pain Location: Leg Pain Orientation: Right, Left Pain Descriptors / Indicators: Aching, Spasm Pain Frequency: Intermittent     BMI - recorded: 25.44 Nutritional Status: BMI 25 -29 Overweight Nutritional Risks: None Diabetes: No  How often do you need to have someone help you when you read instructions, pamphlets, or other written materials from your doctor or pharmacy?: 1 - Never  Diabetic?no  Interpreter Needed?: No  Comments: lives with wife Information entered by :: B.Magdalina Whitehead,LPN   Activities of Daily Living    01/15/2023    1:16 PM 10/03/2022    8:00 AM  In your present state of health, do you have any difficulty performing the following activities:  Hearing? 0 0  Vision? 0 0  Difficulty concentrating or making decisions? 1 0  Walking or climbing stairs? 1 1  Dressing or bathing? 1 0  Doing errands, shopping? 1 0  Preparing Food and eating ? Y   Using the Toilet? Y   In the past six months, have you accidently leaked urine? Y   Do you have problems with loss of bowel control? N   Managing your Medications? Y   Managing your Finances? Y   Housekeeping or managing your Housekeeping? Y     Patient Care Team: Malva Limes, MD as PCP - General  (Family Medicine) [name] Demetrios Isaacs, MD as PCP - Family Medicine (Family Medicine) Mick Sell, MD (Infectious Diseases) Raoul Pitch Soyla Dryer, MD (Urology) Gaspar Cola, [facility] (Pharmacist) Hurshel Party, MD as Referring Physician (Urology)  Indicate any recent Medical Services you may have received from other than [facility] providers in the past year (date may be approximate).     Assessment:   This is a routine wellness examination for Deegan.  Hearing/Vision screen Hearing Screening - Comments:: Adequate hearing Vision Screening - Comments:: Adequate vision No MD right  Dietary issues and exercise activities discussed: Current Exercise Habits: The patient has a physically strenuous job, but has no regular exercise apart from work.   Goals Addressed             This Visit's Progress    DIET - EAT MORE FRUITS AND VEGETABLES   On track      Depression Screen    01/15/2023    1:12 PM 01/10/2022    3:36 PM 12/05/2021    4:26 PM 05/10/2017    3:58 PM  PHQ 2/9 Scores  PHQ - 2 Score 3 1 5 5   PHQ- 9 Score 9 6 19 19     Fall Risk    01/15/2023    2:00 PM 01/10/2022    3:41 PM 12/05/2021    4:25 PM  Fall Risk   Falls in the past year? 0 1 1  Number falls in past yr: 0 0 0  Comment   fell getting into tub  Injury with Fall? 0 0 1  Risk for fall due to : Impaired balance/gait;Impaired mobility No Fall Risks History of fall(s)  Follow up Falls prevention discussed;Education provided Falls evaluation completed Falls evaluation completed    FALL RISK PREVENTION PERTAINING TO THE HOME:  Any stairs in or around the home? No  If so, are there any without handrails? No  Home free of loose throw rugs in walkways, pet beds, electrical cords, etc? Yes  Adequate lighting in your home to reduce risk of falls?  Yes   ASSISTIVE DEVICES UTILIZED TO PREVENT FALLS:  Life alert? No  Use of a cane, walker or w/c? Yes wheelchair Grab bars in the bathroom? Yes  Shower chair or  bench in shower? Yes  Elevated toilet seat or a handicapped toilet? Yes   Cognitive Function:        01/15/2023    1:17 PM 01/10/2022    3:43 PM  6CIT Screen  What Year? 0 points 0 points  What month? 0 points 0 points  What time? 0 points 0 points  Count back from 20 0 points 0 points  Months in reverse 2 points 2 points  Repeat phrase 10 points 0 points  Total Score 12 points 2 points    Immunizations Immunization History  Administered Date(s) Administered   Influenza,inj,Quad PF,6+ Mos 06/25/2014, 07/21/2015, 11/12/2016, 10/22/2017, 06/17/2018, 06/22/2019   Influenza-Unspecified 06/27/2016   Pneumococcal Polysaccharide-23 06/25/2014   Tdap 02/16/2022    TDAP status: Up to date  Flu Vaccine status: Declined, Education has been provided regarding the importance of this vaccine but patient still declined. Advised may receive this vaccine at local pharmacy or Health Dept. Aware to provide a copy of the vaccination record if obtained from local pharmacy or Health Dept. Verbalized acceptance and understanding.  Pneumococcal vaccine status: Declined,  Education has been provided regarding the importance of this vaccine but patient still declined. Advised may receive this vaccine at local pharmacy or Health Dept. Aware to provide a copy of the vaccination record if obtained from local pharmacy or Health Dept. Verbalized acceptance and understanding.   Covid-19 vaccine status: Declined, Education has been provided regarding the importance of this vaccine but patient still declined. Advised may receive this vaccine at local pharmacy or Health Dept.or vaccine clinic. Aware to provide a copy of the vaccination record if obtained from local pharmacy or Health Dept. Verbalized acceptance and understanding.  Qualifies for Shingles Vaccine? No   Zostavax completed No   Shingrix Completed?: No.    Education has been provided regarding the importance of this vaccine. Patient has been advised to  call insurance company to determine out of pocket expense if they have not yet received this vaccine. Advised may also receive vaccine at local pharmacy or Health Dept. Verbalized acceptance and understanding.  Screening Tests Health Maintenance  Topic Date Due   COVID-19 Vaccine (1) Never done   Hepatitis C Screening  Never done   INFLUENZA VACCINE  04/18/2023   Medicare Annual Wellness (AWV)  01/15/2024   DTaP/Tdap/Td (2 - Td or Tdap) 02/17/2032   HIV Screening  Completed   HPV VACCINES  Aged Out    Health Maintenance  Health Maintenance Due  Topic Date Due   COVID-19 Vaccine (1) Never done   Hepatitis C Screening  Never done      Lung Cancer Screening: (Low Dose CT Chest recommended if Age 37-80 years, 30 pack-year currently smoking OR have quit w/in 15years.) does not qualify.   Lung Cancer Screening Referral: no  Additional Screening:  Hepatitis C Screening: does not qualify; Completed yes  Vision Screening: Recommended annual ophthalmology exams for early detection of glaucoma and other disorders of the eye. Is the patient up to date with their annual eye exam?  No  Who is the provider or what is the name of the office in which the patient attends annual eye exams? Pt has not seen in a while If pt is not established with a provider, would they like to be referred  to a provider to establish care? No .   Dental Screening: Recommended annual dental exams for proper oral hygiene  Community Resource Referral / Chronic Care Management: CRR required this visit?  No   CCM required this visit?  No      Plan:     I have personally reviewed and noted the following in the patient's chart:   Medical and social history Use of alcohol, tobacco or illicit drugs  Current medications and supplements including opioid prescriptions. Patient is currently taking opioid prescriptions. Information provided to patient regarding non-opioid alternatives. Patient advised to discuss  non-opioid treatment plan with their provider. Functional ability and status Nutritional status Physical activity Advanced directives List of other physicians Hospitalizations, surgeries, and ER visits in previous 12 months Vitals Screenings to include cognitive, depression, and falls Referrals and appointments  In addition, I have reviewed and discussed with patient certain preventive protocols, quality metrics, and best practice recommendations. A written personalized care plan for preventive services as well as general preventive health recommendations were provided to patient.     Sue Lush, LPN   1/61/0960   Nurse Notes: Pt is requesting a standing order for Oxycodone until he gets to Pain Clinic. I explained he is at maximum amount and order cannot be written standing. I asked him to follow up with the Pain Clinic regarding new pt visit.  *Pt wants to know from PCP about abnormal urine results he is looking at in [place] and if it was sent for culture.

## 2023-01-17 ENCOUNTER — Encounter: Payer: Self-pay | Admitting: Family Medicine

## 2023-01-17 ENCOUNTER — Ambulatory Visit: Payer: Self-pay

## 2023-01-17 ENCOUNTER — Other Ambulatory Visit: Payer: Self-pay | Admitting: Family Medicine

## 2023-01-17 DIAGNOSIS — G894 Chronic pain syndrome: Secondary | ICD-10-CM

## 2023-01-17 NOTE — Telephone Encounter (Signed)
Pt called saying his leg spasms are getting worse.  He has been able to control them with pain medication.  But they are worse today.   Chief Complaint: Muscle spasms both legs, left is worse.Has them day and night. Has been going since his hospitalization.Temp 96.9, pulse 112, BP 132/91. Asking what else can be done. Symptoms: Above Frequency: This week it is worse Pertinent Negatives: Patient denies  Disposition: [] ED /[] Urgent Care (no appt availability in office) / [] Appointment(In office/virtual)/ []  [facility] [facility]/ [] Home Care/ [] Refused Recommended Disposition /[]  Mobile Bus/ [x]  Follow-up with PCP Additional Notes: Please advise pt.  Answer Assessment - Initial Assessment Questions 1. ONSET: "When did the muscle aches or body pains start?"      Since hospitalization 2. LOCATION: "What part of your body is hurting?" (e.g., entire body, arms, legs)      Both legs. Left is worse 3. SEVERITY: "How bad is the pain?" (Scale 1-10; or mild, moderate, severe)   - MILD (1-3): doesn't interfere with normal activities    - MODERATE (4-7): interferes with normal activities or awakens from sleep    - SEVERE (8-10):  excruciating pain, unable to do any normal activities      8-9 4. CAUSE: "What do you think is causing the pains?"     Unsure 5. FEVER: "Have you been having fever?"     No 6. OTHER SYMPTOMS: "Do you have any other symptoms?" (e.g., chest pain, weakness, rash, cold or flu symptoms, weight loss)     No 7. PREGNANCY: "Is there any chance you are pregnant?" "When was your last menstrual period?"     N/a 8. TRAVEL: "Have you traveled out of the country in the last month?" (e.g., travel history, exposures)     No  Protocols used: Muscle Aches and Body Pain-A-AH

## 2023-01-17 NOTE — Telephone Encounter (Signed)
Please Review and advise 

## 2023-01-18 ENCOUNTER — Encounter: Payer: Self-pay | Admitting: Family Medicine

## 2023-01-18 ENCOUNTER — Encounter: Payer: Self-pay | Admitting: Physical Medicine and Rehabilitation

## 2023-01-18 ENCOUNTER — Ambulatory Visit: Payer: Self-pay | Admitting: *Deleted

## 2023-01-18 MED ORDER — CEPHALEXIN 500 MG PO CAPS: 500.0000 mg | ORAL_CAPSULE | Freq: Four times a day (QID) | ORAL | 0 refills | Status: AC

## 2023-01-18 NOTE — Telephone Encounter (Signed)
  Chief Complaint: cloudy urine Symptoms: dark urine, cloudy urine Frequency: yesterday Pertinent Negatives: Patient denies fever Disposition: [] ED /[] Urgent Care (no appt availability in office) / [] Appointment(In office/virtual)/ []  [facility] [facility]/ [] Home Care/ [] Refused Recommended Disposition /[] [place] [facility]/ [x]  Follow-up with PCP Additional Notes: Patient states he has started spasms, his urine is dark and cloudy- patient is concerned that he may have picked up infection while in hospital. Patient would like to get treated and possible culture ordered.

## 2023-01-18 NOTE — Telephone Encounter (Signed)
Summary: Urinary cloudiness   The patient called in stating his urine was very cloudy this morning. He is quite nervous because he recently spent time in the hospital/ICU for an UTI that turned Septic. Previous HP message was already routed to his provider to respond to today. Please assist patient further.         Reason for Disposition  All other urine symptoms  Answer Assessment - Initial Assessment Questions 1. SYMPTOM: "What's the main symptom you're concerned about?" (e.g., frequency, incontinence)     Cloudy urine- patient had recent hospitalization and had catheter in for prolonged period of time 2. ONSET: "When did the  dark urine  start?"     yesterday 3. PAIN: "Is there any pain?" If Yes, ask: "How bad is it?" (Scale: 1-10; mild, moderate, severe)     no 4. CAUSE: "What do you think is causing the symptoms?"     Patient is concerned about UTI- recent septic infection from UTI- patient was in ICU and had to put port in 5. OTHER SYMPTOMS: "Do you have any other symptoms?" (e.g., blood in urine, fever, flank pain, pain with urination)     Dark urine, cloudy, spasms- usually a symptom of infection  Protocols used: Urinary Symptoms-A-AH

## 2023-01-18 NOTE — Telephone Encounter (Signed)
Response via MyChart message.

## 2023-01-19 ENCOUNTER — Encounter: Payer: Self-pay | Admitting: Family Medicine

## 2023-01-21 ENCOUNTER — Ambulatory Visit: Payer: Medicare HMO | Admitting: Family Medicine

## 2023-01-21 ENCOUNTER — Ambulatory Visit: Payer: Self-pay | Admitting: *Deleted

## 2023-01-21 ENCOUNTER — Telehealth (INDEPENDENT_AMBULATORY_CARE_PROVIDER_SITE_OTHER): Payer: Medicare HMO | Admitting: Family Medicine

## 2023-01-21 VITALS — BP 120/75 | HR 72

## 2023-01-21 DIAGNOSIS — R252 Cramp and spasm: Secondary | ICD-10-CM

## 2023-01-21 MED ORDER — PREGABALIN 150 MG PO CAPS: ORAL_CAPSULE | ORAL | 1 refills | Status: DC

## 2023-01-21 NOTE — Patient Outreach (Signed)
  Care Coordination   Initial Visit Note   01/22/2023 Name: Justin [name] MRN: [id number] DOB: 09-05-1981  Justin [name] is a 42 y.o. year old male who sees Fisher, Demetrios Isaacs, MD for primary care. I spoke with  [name] by phone today.  What matters to the patients health and wellness today? Recently hospitalized for UTI/sepsis, state this has not completely cleared.  Usually is independent, drives self to appointments, etc, but has been weak and unable to do so due to spasms.    Goals Addressed             This Visit's Progress    Effective management of muscle spasms       Care Coordination Interventions: Evaluation of current treatment plan related to paraplegia with muscle spasms and patient's adherence to plan as established by provider Advised patient to call pain clinic at [facility] and/or [facility] to follow up on referral Screening for signs and symptoms of depression related to chronic disease state  Assessed social determinant of health barriers         SDOH assessments and interventions completed:  Yes    SDOH Interventions    Flowsheet Row Clinical Support from 01/15/2023 in [facility] Telephone from 01/09/2023 in [facility] Care Coordination Telephone from 10/08/2022 in [facility] Coordination Clinical Support from 01/10/2022 in [facility]  SDOH Interventions      Food Insecurity Interventions Intervention Not Indicated Intervention Not Indicated Intervention Not Indicated Intervention Not Indicated  Housing Interventions Intervention Not Indicated Intervention Not Indicated Intervention Not Indicated Intervention Not Indicated  Transportation Interventions -- Intervention Not Indicated Intervention Not Indicated Intervention Not Indicated  Utilities Interventions Intervention Not Indicated -- -- --  Alcohol Usage Interventions  Intervention Not Indicated (Score <7) -- -- --  Financial Strain Interventions Intervention Not Indicated -- -- Intervention Not Indicated  Physical Activity Interventions -- -- -- Patient Refused  Stress Interventions Intervention Not Indicated -- -- Intervention Not Indicated  Social Connections Interventions Intervention Not Indicated -- -- Intervention Not Indicated        Care Coordination Interventions:  Yes, provided   Interventions Today    Flowsheet Row Most Recent Value  Chronic Disease   Chronic disease during today's visit Other  [muscle spasms, paraplegia, hip pain]  General Interventions   General Interventions Discussed/Reviewed General Interventions Reviewed, Doctor Visits  Doctor Visits Discussed/Reviewed Doctor Visits Reviewed, PCP  [Has video visit today with PCP]  PCP/Specialist Visits Compliance with follow-up visit  Education Interventions   Education Provided Provided Education  Provided Verbal Education On Other, When to see the doctor  [recovering from UTI, feels something more is going on.  State he usually has spasms when there is inflammation in body]       Follow up plan: Follow up call scheduled for 5/21    Encounter Outcome:  Pt. Visit Completed   Kemper Durie, RN, MSN, [facility] Care Management Care Management Coordinator [phone]

## 2023-01-21 NOTE — Progress Notes (Signed)
I,Sha'taria Tyson,acting as a Neurosurgeon for Mila Merry, MD.,have documented all relevant documentation on the behalf of Mila Merry, MD,as directed by  Mila Merry, MD while in the presence of Mila Merry, MD.  MyChart Video Visit    Virtual Visit via Video Note   This format is felt to be most appropriate for this patient at this time. Physical exam was limited by quality of the video and audio technology used for the visit.   Patient location: Home Provider location: [facility]  I discussed the limitations of evaluation and management by telemedicine and the availability of in person appointments. The patient expressed understanding and agreed to proceed.  Patient: Justin [name]   DOB: 1981-04-05   42 y.o. Male  MRN: [id number] Visit Date: 01/21/2023  Today's healthcare provider: Mila Merry, MD    Subjective    HPI  Patient reports he is having pain predominantly in his left leg. Reports he is taking rx that has been prescribed but the pain overcomes it at times.   Has been prescribed up to 800mg  gabapentin four times a day, but he states he is now taking 400mg  of an old prescription from 2016.   He did report starting to have some cloudy urine over the weekend and was started on cephalexin. He states that urine is completely cleared up today.   Medications: Outpatient Medications Prior to Visit  Medication Sig   albuterol (VENTOLIN HFA) 108 (90 Base) MCG/ACT inhaler Inhale 2 puffs into the lungs every 6 (six) hours as needed for wheezing or shortness of breath.   alprazolam (XANAX) 2 MG tablet Take 1 tablet (2 mg total) by mouth every 6 (six) hours as needed for anxiety.   amphetamine-dextroamphetamine (ADDERALL) 15 MG tablet Take 1 tablet by mouth 2 (two) times daily.   bacitracin ointment Apply topically 2 (two) times daily.   baclofen (LIORESAL) 20 MG tablet TAKE ONE TABLET FOUR TIMES DAILY (Patient taking differently: Take 20 mg by  mouth 4 (four) times daily.)   baclofen (LIORESAL) 20 MG tablet Take 1 tablet (20 mg total) by mouth 4 (four) times daily as needed for muscle spasms.   cephALEXin (KEFLEX) 500 MG capsule Take 1 capsule (500 mg total) by mouth 4 (four) times daily for 7 days.   docusate sodium (COLACE) 250 MG capsule Take 1 capsule (250 mg total) by mouth 4 (four) times daily as needed.   fesoterodine (TOVIAZ) 8 MG TB24 tablet TAKE 1 TABLET BY MOUTH DAILY   flavoxATE (URISPAS) 100 MG tablet Take 1 tablet (100 mg total) by mouth 3 (three) times daily as needed for bladder spasms.   leptospermum manuka honey (MEDIHONEY) PSTE paste Apply 1 Application topically daily.   levothyroxine (SYNTHROID) 88 MCG tablet TAKE ONE TABLET BY MOUTH EVERY DAY   linaclotide (LINZESS) 145 MCG CAPS capsule Take 145 mcg by mouth daily before breakfast.   methadone (DOLOPHINE) 10 MG tablet Take 2 tablets (20 mg total) by mouth every 6 (six) hours.   Mineral Oil OIL daily.   mupirocin ointment (BACTROBAN) 2 % Apply 1 Application topically 2 (two) times daily.   naproxen sodium (ANAPROX) 550 MG tablet TAKE 1 TABLET BY MOUTH 2 TIMES DAILY WITH A MEAL.   omeprazole (PRILOSEC) 20 MG capsule TAKE 1 CAPSULE BY MOUTH ONCE DAILY   oxybutynin (DITROPAN) 5 MG tablet Take 5 mg by mouth daily as needed.   oxyCODONE (ROXICODONE) 15 MG immediate release tablet Take 1 tablet (15 mg total)  by mouth every 4 (four) hours as needed for pain. Take one tablet every 4-5 hours as needed, no more than 5 in a day   promethazine (PHENERGAN) 25 MG suppository PLACE 1 SUPPOSITORY RECTALLY EVERY 6 HOURS AS NEEDED FOR NAUSEA OR VOMITING (Patient taking differently: Place 25 mg rectally every 6 (six) hours as needed for nausea or vomiting.)   promethazine (PHENERGAN) 25 MG tablet TAKE 1 TABLET BY MOUTH EVERY 4 HOURS AS NEEDED FOR NAUSEA AND VOMITING   sildenafil (REVATIO) 20 MG tablet Take 3 to 5 tablets two hours before intercouse on an empty stomach.  Do not take with  nitrates.   tiZANidine (ZANAFLEX) 4 MG tablet TAKE 2 TABLETS BY MOUTH 4 TIMES DAILY   gabapentin (NEURONTIN) 400 MG capsule Take 400 mg by mouth 4 (four) times daily as needed (spasms). (Patient not taking: Reported on 01/21/2023)   No facility-administered medications prior to visit.    Review of Systems  Constitutional:  Negative for appetite change, chills and fever.  Respiratory:  Negative for chest tightness, shortness of breath and wheezing.   Cardiovascular:  Negative for chest pain and palpitations.  Gastrointestinal:  Negative for abdominal pain, nausea and vomiting.       Objective    There were no vitals taken for this visit.    Physical Exam   Awake, alert, oriented x 3. In no apparent distress    Assessment & Plan     1. Spasticity By patient report all signs of UTI have resolved since starting back on cephalexin over the weekend.  He has been taking very old prescription of gabapentin, but has trouble dropping things when he takes, especially with escalation of dose. Will change to pregabalin (LYRICA) 150 MG capsule; Take one tablet two or three times a day as needed  Dispense: 60 capsule; Refill: 1      I discussed the assessment and treatment plan with the patient. The patient was provided an opportunity to ask questions and all were answered. The patient agreed with the plan and demonstrated an understanding of the instructions.   The patient was advised to call back or seek an in-person evaluation if the symptoms worsen or if the condition fails to improve as anticipated.  I provided 15 minutes of non-face-to-face time during this encounter.  The entirety of the information documented in the History of Present Illness, Review of Systems and Physical Exam were personally obtained by me. Portions of this information were initially documented by the CMA and reviewed by me for thoroughness and accuracy.    Mila Merry, MD [facility] [phone] (phone) [phone] (fax)  [facility]

## 2023-01-22 ENCOUNTER — Encounter: Payer: Self-pay | Admitting: Family Medicine

## 2023-01-22 DIAGNOSIS — R339 Retention of urine, unspecified: Secondary | ICD-10-CM | POA: Diagnosis not present

## 2023-01-23 ENCOUNTER — Encounter: Payer: Self-pay | Admitting: Family Medicine

## 2023-01-25 ENCOUNTER — Telehealth: Payer: Self-pay | Admitting: Family Medicine

## 2023-01-30 ENCOUNTER — Encounter: Payer: Self-pay | Admitting: Family Medicine

## 2023-02-02 ENCOUNTER — Encounter: Payer: Self-pay | Admitting: Family Medicine

## 2023-02-04 ENCOUNTER — Other Ambulatory Visit: Payer: Self-pay | Admitting: Family Medicine

## 2023-02-04 ENCOUNTER — Ambulatory Visit: Payer: Self-pay

## 2023-02-04 ENCOUNTER — Telehealth: Payer: Self-pay | Admitting: Family Medicine

## 2023-02-04 DIAGNOSIS — G894 Chronic pain syndrome: Secondary | ICD-10-CM

## 2023-02-04 NOTE — Telephone Encounter (Signed)
  Chief Complaint: UTI Symptoms: dysuria, cloudiness, odor, spasms worse, rash on anus and testicles  Frequency: 1 day for UTI sx,  Pertinent Negatives: NA Disposition: [] ED /[] Urgent Care (no appt availability in office) / [x] Appointment(In office/virtual)/ []  [place] [facility]/ [] Home Care/ [] Refused Recommended Disposition /[] [place] [facility]/ []  Follow-up with PCP Additional Notes: pt states he has a clean catch sample that he collected this morning before placing foley d/t sx. Pt states he didn't complete all of the Keflex from 01/18/23 because his pill box got mixed up but only had 4 tablets left. Pt also has sent Mychart message for rash but different creams he is using are anti-fungal and are not helping. Pt was wanting wife to bring in urine sample but advised him would be easier to do OV so he can address everything at once. Called and spoke to [place], [place] d/t unable to schedule OV tomorrow at 1120 with PCP. She was able to schedule and I advised pt of OV and care advice. No further assistance needed.   Summary: ? UTI   Patient has a urine catheter and suspects he has a ? UTI. Experiencing burning, odor and frequent urination (having to change catheter frequently). Exp symptoms for  1 day. Patient requesting urine orders and would like spouse to drop off urine today. Will be a clean catch. Patient declined appointment     Reason for Disposition  Foul-smelling urine (urine smells bad)  Answer Assessment - Initial Assessment Questions 1. SYMPTOMS: "What symptoms are you concerned about?"     Burning and cloudiness  2. ONSET:  "When did the symptoms start?"     Yesterday  4. ABDOMEN PAIN: "Is there any abdomen pain?" (e.g., Scale 1-10; or mild, moderate, severe)     no 5. URINE COLOR: "What color is the urine?"  "Is there blood present in the urine?" (e.g., clear, yellow, cloudy, tea-colored, blood streaks, bright red)     Dark yellow or orange and cloudy  7. INSERTION:  "How long have you (they) had the catheter?"     I&O cath but placed foley this morning  8. OTHER SYMPTOMS: "Are there any other symptoms?" (e.g., abdomen swelling, back pain, bladder spasms, constipation, foul smelling urine, leaking of urine)      Foul smell, spasms worse today  9. MEDICINES: "Are you taking any medicines to treat urinary problems?" (e.g., antibiotics for a urinary tract infection, medicines to treat bladder spasms)  Recently Keflex  Protocols used: Urinary Catheter (e.g., Foley) Symptoms and Questions-A-AH

## 2023-02-04 NOTE — Telephone Encounter (Signed)
[employer] requesting prior authorization Key: [id number] Name: [place] Fesoterodine Fumarate ER 8MG  ER Tablets

## 2023-02-05 ENCOUNTER — Encounter: Payer: Self-pay | Admitting: Family Medicine

## 2023-02-05 ENCOUNTER — Ambulatory Visit: Payer: Self-pay | Admitting: *Deleted

## 2023-02-05 ENCOUNTER — Other Ambulatory Visit: Payer: Self-pay | Admitting: Family Medicine

## 2023-02-05 ENCOUNTER — Ambulatory Visit (INDEPENDENT_AMBULATORY_CARE_PROVIDER_SITE_OTHER): Payer: Medicare HMO | Admitting: Family Medicine

## 2023-02-05 VITALS — BP 132/120 | HR 112 | Temp 98.4°F | Resp 20

## 2023-02-05 DIAGNOSIS — F1190 Opioid use, unspecified, uncomplicated: Secondary | ICD-10-CM | POA: Diagnosis not present

## 2023-02-05 DIAGNOSIS — N390 Urinary tract infection, site not specified: Secondary | ICD-10-CM

## 2023-02-05 DIAGNOSIS — F988 Other specified behavioral and emotional disorders with onset usually occurring in childhood and adolescence: Secondary | ICD-10-CM | POA: Diagnosis not present

## 2023-02-05 DIAGNOSIS — S24109S Unspecified injury at unspecified level of thoracic spinal cord, sequela: Secondary | ICD-10-CM | POA: Diagnosis not present

## 2023-02-05 LAB — POCT URINALYSIS DIPSTICK
Bilirubin, UA: NEGATIVE
Glucose, UA: NEGATIVE
Ketones, UA: NEGATIVE
Nitrite, UA: POSITIVE
Protein, UA: POSITIVE — AB
Spec Grav, UA: 1.025 (ref 1.010–1.025)
Urobilinogen, UA: 0.2 E.U./dL
pH, UA: 6 (ref 5.0–8.0)

## 2023-02-05 MED ORDER — CIPROFLOXACIN HCL 500 MG PO TABS: 500.0000 mg | ORAL_TABLET | Freq: Two times a day (BID) | ORAL | 0 refills | Status: AC

## 2023-02-05 MED ORDER — TRIAMCINOLONE ACETONIDE 0.5 % EX OINT: TOPICAL_OINTMENT | CUTANEOUS | 0 refills | Status: DC

## 2023-02-05 MED ORDER — AMPHETAMINE-DEXTROAMPHETAMINE 15 MG PO TABS
15.0000 mg | ORAL_TABLET | Freq: Two times a day (BID) | ORAL | 0 refills | Status: AC
Start: 2023-02-05 — End: ?

## 2023-02-05 MED ORDER — BACLOFEN 20 MG PO TABS
20.0000 mg | ORAL_TABLET | Freq: Four times a day (QID) | ORAL | 11 refills | Status: DC
Start: 2023-02-05 — End: 2023-02-13

## 2023-02-05 NOTE — Telephone Encounter (Signed)
Receipt confirmed by pharmacy 02/05/23 at 12:13 PM today . Need to clarify with pharmacy received  Rx request.

## 2023-02-05 NOTE — Progress Notes (Signed)
Established patient visit    I,Roshena L Chambers,acting as a scribe for Mila Merry, MD.,have documented all relevant documentation on the behalf of Mila Merry, MD,as directed by  Mila Merry, MD while in the presence of Mila Merry, MD.   Patient: Justin [name]   DOB: 21-Aug-1981   42 y.o. Male  MRN: [id number] Visit Date: 02/05/2023  Today's healthcare provider: Mila Merry, MD   Chief Complaint  Patient presents with   Leg spasm   Pain Management   Subjective    HPI  Urinary symptoms  Patient is a 42 year old male who presents for evaluation of urinary infection. Complains of leg spasms Patient also complains of rash on his testicles, left hand and buttocks. Referral physical medicine recommended at recent hospitalization is in progress.   ---------------------------------------------------------------------------------------   Medications: Outpatient Medications Prior to Visit  Medication Sig   albuterol (VENTOLIN HFA) 108 (90 Base) MCG/ACT inhaler Inhale 2 puffs into the lungs every 6 (six) hours as needed for wheezing or shortness of breath.   alprazolam (XANAX) 2 MG tablet Take 1 tablet (2 mg total) by mouth every 6 (six) hours as needed for anxiety.   amphetamine-dextroamphetamine (ADDERALL) 15 MG tablet Take 1 tablet by mouth 2 (two) times daily.   bacitracin ointment Apply topically 2 (two) times daily.   baclofen (LIORESAL) 20 MG tablet TAKE ONE TABLET FOUR TIMES DAILY (Patient taking differently: Take 20 mg by mouth 4 (four) times daily.)   baclofen (LIORESAL) 20 MG tablet Take 1 tablet (20 mg total) by mouth 4 (four) times daily as needed for muscle spasms.   docusate sodium (COLACE) 250 MG capsule Take 1 capsule (250 mg total) by mouth 4 (four) times daily as needed.   fesoterodine (TOVIAZ) 8 MG TB24 tablet TAKE 1 TABLET BY MOUTH DAILY   flavoxATE (URISPAS) 100 MG tablet Take 1 tablet (100 mg total) by mouth 3 (three) times daily as  needed for bladder spasms.   leptospermum manuka honey (MEDIHONEY) PSTE paste Apply 1 Application topically daily.   levothyroxine (SYNTHROID) 88 MCG tablet TAKE ONE TABLET BY MOUTH EVERY DAY   linaclotide (LINZESS) 145 MCG CAPS capsule Take 145 mcg by mouth daily before breakfast.   methadone (DOLOPHINE) 10 MG tablet TAKE 2 TABLETS BY MOUTH EVERY 6 HOURS   Mineral Oil OIL daily.   mupirocin ointment (BACTROBAN) 2 % Apply 1 Application topically 2 (two) times daily.   naproxen sodium (ANAPROX) 550 MG tablet TAKE 1 TABLET BY MOUTH 2 TIMES DAILY WITH A MEAL.   omeprazole (PRILOSEC) 20 MG capsule TAKE 1 CAPSULE BY MOUTH ONCE DAILY   oxybutynin (DITROPAN) 5 MG tablet Take 5 mg by mouth daily as needed.   oxyCODONE (ROXICODONE) 15 MG immediate release tablet Take 1 tablet (15 mg total) by mouth every 4 (four) hours as needed for pain. Take one tablet every 4-5 hours as needed, no more than 5 in a day   pregabalin (LYRICA) 150 MG capsule Take one tablet two or three times a day as needed   promethazine (PHENERGAN) 25 MG suppository PLACE 1 SUPPOSITORY RECTALLY EVERY 6 HOURS AS NEEDED FOR NAUSEA OR VOMITING (Patient taking differently: Place 25 mg rectally every 6 (six) hours as needed for nausea or vomiting.)   promethazine (PHENERGAN) 25 MG tablet TAKE 1 TABLET BY MOUTH EVERY 4 HOURS AS NEEDED FOR NAUSEA AND VOMITING   sildenafil (REVATIO) 20 MG tablet Take 3 to 5 tablets two hours before intercouse  on an empty stomach.  Do not take with nitrates.   tiZANidine (ZANAFLEX) 4 MG tablet TAKE 2 TABLETS BY MOUTH 4 TIMES DAILY   No facility-administered medications prior to visit.    Review of Systems  Constitutional:  Negative for appetite change, chills and fever.  Respiratory:  Negative for chest tightness, shortness of breath and wheezing.   Cardiovascular:  Negative for chest pain and palpitations.  Gastrointestinal:  Negative for abdominal pain, nausea and vomiting.  Skin:  Positive for rash (left  hand;) and wound (rash , redness and sore on buttocks).       Objective    BP (!) 132/120   Pulse (!) 112   Temp 98.4 F (36.9 C) (Oral)   Resp 20   SpO2 96% Comment: room air   Physical Exam    Results for orders placed or performed in visit on 02/05/23  POCT Urinalysis Dipstick  Result Value Ref Range   Color, UA yellow    Clarity, UA cloudy    Glucose, UA Negative Negative   Bilirubin, UA negative    Ketones, UA negative    Spec Grav, UA 1.025 1.010 - 1.025   Blood, UA moderate (hemolyzed)    pH, UA 6.0 5.0 - 8.0   Protein, UA Positive (A) Negative   Urobilinogen, UA 0.2 0.2 or 1.0 E.U./dL   Nitrite, UA POSITIVE    Leukocytes, UA Moderate (2+) (A) Negative   Appearance     Odor      Assessment & Plan     1. Recurrent UTI - ciprofloxacin (CIPRO) 500 MG tablet; Take 1 tablet (500 mg total) by mouth 2 (two) times daily for 10 days.  Dispense: 20 tablet; Refill: 0  - Urine Culture  2. Chronic, continuous use of opioids  - Pain Mgt Scrn (14 Drugs), Ur  3. Injury of thoracic spinal cord, sequela (HCC)   4. Attention deficit disorder, unspecified hyperactivity presence Refill amphetamine-dextroamphetamine (ADDERALL) 15 MG tablet; Take 1 tablet by mouth 2 (two) times daily.  Dispense: 60 tablet; Refill: 0      The entirety of the information documented in the History of Present Illness, Review of Systems and Physical Exam were personally obtained by me. Portions of this information were initially documented by the CMA and reviewed by me for thoroughness and accuracy.     Mila Merry, MD  [facility] [phone] (phone) [phone] (fax)  [facility]

## 2023-02-05 NOTE — Patient Outreach (Signed)
  Care Coordination   Follow Up Visit Note   02/05/2023 Name: Justin [name] MRN: [id number] DOB: July 28, 1981  Justin [name] is a 42 y.o. year old male who sees Justin, Demetrios Isaacs, MD for primary care. I spoke with  Justin [name] by phone today.  What matters to the patients health and wellness today?  Have UTI/yeast cleared in effort help decrease autoimmune response and spasms.  Also looking to have pain clinic appointment for chronic hip pain    Goals Addressed             This Visit's Progress    Effective management of muscle spasms   On track    Care Coordination Interventions: Evaluation of current treatment plan related to paraplegia with muscle spasms and patient's adherence to plan as established by provider Advised patient to call pain clinic at [facility] and/or [facility] to follow up on referral Discussed plans with patient for ongoing care management follow up and provided patient with direct contact information for care management team         SDOH assessments and interventions completed:  No     Care Coordination Interventions:  Yes, provided   Interventions Today    Flowsheet Row Most Recent Value  Chronic Disease   Chronic disease during today's visit Other  [muscle spasms/UTI, report spasms are much better]  General Interventions   General Interventions Discussed/Reviewed General Interventions Reviewed, Doctor Visits  Doctor Visits Discussed/Reviewed Doctor Visits Reviewed, PCP  [Will follow up with urology on 6/27.  State he has not received appointment from pain clinic, report PCP office is to call to follow up on referral today]  PCP/Specialist Visits Compliance with follow-up visit  [PCP visit completed today, diagnosed with UTI and yeast infection]  Education Interventions   Education Provided Provided Education  Provided Verbal Education On Medication  [Not taking Cipro]       Follow up plan: Follow up call scheduled for  5/29    Encounter Outcome:  Pt. Visit Completed   Justin Durie, RN, MSN, [facility] Care Management Care Management Coordinator [phone]

## 2023-02-06 LAB — PAIN MGT SCRN (14 DRUGS), UR
Amphetamine Scrn, Ur: NEGATIVE ng/mL
BARBITURATE SCREEN URINE: NEGATIVE ng/mL
BENZODIAZEPINE SCREEN, URINE: POSITIVE ng/mL — AB
Buprenorphine, Urine: NEGATIVE ng/mL
CANNABINOIDS UR QL SCN: NEGATIVE ng/mL
Cocaine (Metab) Scrn, Ur: NEGATIVE ng/mL
Creatinine(Crt), U: 103.7 mg/dL (ref 20.0–300.0)
Fentanyl, Urine: NEGATIVE pg/mL
Meperidine Screen, Urine: NEGATIVE ng/mL
Methadone Screen, Urine: POSITIVE ng/mL — AB
OXYCODONE+OXYMORPHONE UR QL SCN: POSITIVE ng/mL — AB
Opiate Scrn, Ur: NEGATIVE ng/mL
Ph of Urine: 5.4 (ref 4.5–8.9)
Phencyclidine Qn, Ur: NEGATIVE ng/mL
Propoxyphene Scrn, Ur: NEGATIVE ng/mL
Tramadol Screen, Urine: NEGATIVE ng/mL

## 2023-02-06 NOTE — Telephone Encounter (Signed)
Refilled 02/05/23 #120 with 11 refills. Requested Prescriptions  Refused Prescriptions Disp Refills   baclofen (LIORESAL) 20 MG tablet [Pharmacy Med Name: BACLOFEN 20 MG TAB] 120 each     Sig: TAKE ONE TABLET FOUR TIMES DAILY     Analgesics:  Muscle Relaxants - baclofen Failed - 02/05/2023 10:41 AM      Failed - Cr in normal range and within 180 days    Creatinine  Date Value Ref Range Status  06/28/2014 0.82 0.60 - 1.30 mg/dL Final   Creatinine, Ser  Date Value Ref Range Status  01/14/2023 0.72 (L) 0.76 - 1.27 mg/dL Final         Passed - eGFR is 30 or above and within 180 days    EGFR (African American)  Date Value Ref Range Status  06/28/2014 >60 >36mL/min Final   GFR calc Af Amer  Date Value Ref Range Status  09/15/2020 130 >59 mL/min/1.73 Final    Comment:    **In accordance with recommendations from the NKF-ASN Task force,**   [facility] is in the process of updating its eGFR calculation to the   2021 CKD-EPI creatinine equation that estimates kidney function   without a race variable.    EGFR (Non-African Amer.)  Date Value Ref Range Status  06/28/2014 >60 >13mL/min Final    Comment:    eGFR values <71mL/min/1.73 m2 may be an indication of chronic kidney disease (CKD). Calculated eGFR, using the MRDR Study equation, is useful in  patients with stable renal function. The eGFR calculation will not be reliable in acutely ill patients when serum creatinine is changing rapidly. It is not useful in patients on dialysis. The eGFR calculation may not be applicable to patients at the low and high extremes of body sizes, pregnant women, and vegetarians.    GFR, Estimated  Date Value Ref Range Status  01/07/2023 >60 >60 mL/min Final    Comment:    (NOTE) Calculated using the CKD-EPI Creatinine Equation (2021)    eGFR  Date Value Ref Range Status  01/14/2023 117 >59 mL/min/1.73 Final         Passed - Valid encounter within last 6 months    Recent Outpatient Visits            Yesterday Recurrent UTI   [facility] Malva Limes, MD   2 weeks ago Spasticity   [facility] Malva Limes, MD   3 weeks ago Sepsis due to urinary tract infection [facility])   [facility] Malva Limes, MD   2 months ago Recurrent UTI   [facility] Malva Limes, MD   3 months ago Chronic pain associated with significant psychosocial dysfunction   [facility] Malva Limes, MD       Future Appointments             In 2 months Lovorn, Aundra Millet, MD [facility], [facility]   In 2 months Willeen Niece, MD [facility]

## 2023-02-08 LAB — URINE CULTURE

## 2023-02-09 ENCOUNTER — Encounter: Payer: Self-pay | Admitting: Family Medicine

## 2023-02-09 DIAGNOSIS — R110 Nausea: Secondary | ICD-10-CM

## 2023-02-09 LAB — URINE CULTURE

## 2023-02-10 LAB — URINE CULTURE

## 2023-02-12 ENCOUNTER — Other Ambulatory Visit: Payer: Self-pay | Admitting: Family Medicine

## 2023-02-12 ENCOUNTER — Encounter: Payer: Self-pay | Admitting: Family Medicine

## 2023-02-12 DIAGNOSIS — G894 Chronic pain syndrome: Secondary | ICD-10-CM

## 2023-02-12 MED ORDER — PROMETHAZINE HCL 25 MG RE SUPP
25.0000 mg | Freq: Four times a day (QID) | RECTAL | 3 refills | Status: DC | PRN
Start: 2023-02-12 — End: 2023-10-24

## 2023-02-13 ENCOUNTER — Ambulatory Visit: Payer: Self-pay | Admitting: *Deleted

## 2023-02-13 ENCOUNTER — Telehealth: Payer: Self-pay

## 2023-02-13 ENCOUNTER — Encounter: Payer: Self-pay | Admitting: Family Medicine

## 2023-02-13 ENCOUNTER — Encounter: Payer: Medicare HMO | Attending: Physical Medicine and Rehabilitation | Admitting: Physical Medicine and Rehabilitation

## 2023-02-13 ENCOUNTER — Other Ambulatory Visit: Payer: Self-pay | Admitting: Family Medicine

## 2023-02-13 ENCOUNTER — Telehealth: Payer: Self-pay | Admitting: *Deleted

## 2023-02-13 ENCOUNTER — Encounter: Payer: Self-pay | Admitting: Physical Medicine and Rehabilitation

## 2023-02-13 VITALS — BP 100/64 | HR 74 | Ht 76.0 in

## 2023-02-13 DIAGNOSIS — N319 Neuromuscular dysfunction of bladder, unspecified: Secondary | ICD-10-CM | POA: Diagnosis not present

## 2023-02-13 DIAGNOSIS — T402X5A Adverse effect of other opioids, initial encounter: Secondary | ICD-10-CM | POA: Insufficient documentation

## 2023-02-13 DIAGNOSIS — K592 Neurogenic bowel, not elsewhere classified: Secondary | ICD-10-CM | POA: Diagnosis not present

## 2023-02-13 DIAGNOSIS — S24109S Unspecified injury at unspecified level of thoracic spinal cord, sequela: Secondary | ICD-10-CM

## 2023-02-13 DIAGNOSIS — K5903 Drug induced constipation: Secondary | ICD-10-CM | POA: Diagnosis not present

## 2023-02-13 DIAGNOSIS — Z993 Dependence on wheelchair: Secondary | ICD-10-CM

## 2023-02-13 DIAGNOSIS — R252 Cramp and spasm: Secondary | ICD-10-CM | POA: Diagnosis not present

## 2023-02-13 DIAGNOSIS — G894 Chronic pain syndrome: Secondary | ICD-10-CM

## 2023-02-13 DIAGNOSIS — G8220 Paraplegia, unspecified: Secondary | ICD-10-CM

## 2023-02-13 MED ORDER — LUBIPROSTONE 24 MCG PO CAPS: 24.0000 ug | ORAL_CAPSULE | Freq: Two times a day (BID) | ORAL | 5 refills | Status: DC

## 2023-02-13 NOTE — Patient Outreach (Signed)
  Care Coordination   Follow Up Visit Note   02/14/2023 Name: Khamir Applin MRN: [id number] DOB: 1981/01/27  Kaleem Lores is a 42 y.o. year old male who sees Fisher, Demetrios Isaacs, MD for primary care. I spoke with  [name] by phone today.  What matters to the patients health and wellness today?  Managing chronic hip pain related to paralysis and muscle spasms.  State he has appointment with new spine/paralysis specialist today.    Goals Addressed             This Visit's Progress    Effective management of muscle spasms   On track    Care Coordination Interventions: Evaluation of current treatment plan related to paraplegia with muscle spasms and patient's adherence to plan as established by provider Advised patient to call pain clinic at [facility] and/or [facility] to follow up on referral Discussed plans with patient for ongoing care management follow up and provided patient with direct contact information for care management team         SDOH assessments and interventions completed:  No     Care Coordination Interventions:  Yes, provided   Interventions Today    Flowsheet Row Most Recent Value  Chronic Disease   Chronic disease during today's visit Other  [spasms and chronic hip pain]  General Interventions   General Interventions Discussed/Reviewed General Interventions Reviewed, Doctor Visits, Communication with  Doctor Visits Discussed/Reviewed Doctor Visits Reviewed  PCP/Specialist Visits Contact provider for referral to  [facility] provider for referral to Specialist  Communication with PCP/Specialists  [spoke with [facility] pain specialist, state they are still working on referrals received in April, patient's referral was received the end of April.  Advised to tell patient it will be at least another 1-2 weeks before referral is processed completely]       Follow up plan:  5/30    Encounter Outcome:  Pt. Visit Completed   Kemper Durie,  RN, MSN, [facility] Care Management Care Management Coordinator [phone]

## 2023-02-13 NOTE — Telephone Encounter (Signed)
[address] ([place]) - [id number] Lubiprostone capsules Status: PA Response - Approved
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
## 2023-02-13 NOTE — Telephone Encounter (Signed)
Justin [name] (Justin [name]) PA Case ID #: [id number] Rx #: [id number]

## 2023-02-13 NOTE — Patient Instructions (Addendum)
Pt is a 42 yr old male with hx of an incomplete thoracic T9 also had L3 injury as well-  Paraplegia since March 01 1999-at age 68-  Also has TBI- with cracked skull-   With neurogenic bowel and bladder and spasticity.  Here for evaluation of SCI.    Lives in [place]- Referral for  Dr Tressie Ellis Katrinka Blazing- Neuro-urology- will place referral. Placed referral  2.  Can increase baclofen - can increase to 30 mg QID- is normal for SCI patients to need up to 40 mg QID is normal for SCI patients. Increasing cna cause more constipation, so I suggest going up on bowel meds.    3.  Goal to take wife on trips again- to mountains, etc. Will try to work towards this.   4. Wants Neuro rehab- therapy to improve ROM/and function. [facility].   5. Dr [name] is prescribing catheters- uses [facility]- gets 6x/day.   6. Needs new w/c- and ROHO cushion- has appt- late June- with PT and has appt with [facility]. I'm happy to take over with prescribing for w/c if need be.   7. Amitiza-- 24 mcg 2x/day- will need to  switch from Linzess 145 mcg/day- , because is having  difficulty not being able to have BM's regularly, unless has to take multiple oral bowel meds- has tried Amitiza in past and it worked really well- also having to take enema at least 1x/week due to lack of response to Linzess (tried 290 mcg in past without any additional improvement and more side effects).    8. Trying to get into pain mgmt for chronic pain issues.   9. F/U in 3 months- double appt. SCI  10. D/w pt about chronic pain- trying to get in with pain mgmt- we discussed this at length.

## 2023-02-13 NOTE — Telephone Encounter (Signed)
Called [facility] to advise as below.

## 2023-02-13 NOTE — Telephone Encounter (Signed)
Copied from CRM [id number]. Topic: General - Other >> Feb 13, 2023 10:47 AM Epimenio Foot F wrote: Reason for CRM: Okey Regal with [facility] is calling in asking to speak with the Prior Authorization department regarding medication. Okey Regal says she received a call from someone earlier and she had trouble understanding them. Okey Regal is requesting a callback.

## 2023-02-13 NOTE — Telephone Encounter (Signed)
Medication Refill - Medication: oxyCODONE (ROXICODONE) 15 MG immediate release tablet   Has the patient contacted their pharmacy? No.   Preferred Pharmacy (with phone number or street name):  [facility] - [place], [place] - [address]  [address] [place] [address]  Phone: [phone] Fax: [phone]  Hours: Not open 24 hours      Has the patient been seen for an appointment in the last year OR does the patient have an upcoming appointment? Yes.    Agent: Please be advised that RX refills may take up to 3 business days. We ask that you follow-up with your pharmacy.

## 2023-02-13 NOTE — Telephone Encounter (Signed)
[facility] advised that PA was not needed for Toviaz 8 mg tablet.

## 2023-02-13 NOTE — Telephone Encounter (Signed)
Requested medication (s) are due for refill today: yes  Requested medication (s) are on the active medication list: yes  Last refill:  01/10/23 #180/0  Future visit scheduled: no  Notes to clinic:  Unable to refill per protocol, cannot delegate.    Requested Prescriptions  Pending Prescriptions Disp Refills   oxyCODONE (ROXICODONE) 15 MG immediate release tablet 180 tablet 0    Sig: Take 1 tablet (15 mg total) by mouth every 4 (four) hours as needed for pain. Take one tablet every 4-5 hours as needed, no more than 5 in a day     Not Delegated - Analgesics:  Opioid Agonists Failed - 02/13/2023  4:15 PM      Failed - This refill cannot be delegated      Passed - Urine Drug Screen completed in last 360 days      Passed - Valid encounter within last 3 months    Recent Outpatient Visits           1 week ago Recurrent UTI   [facility] Malva Limes, MD   3 weeks ago Spasticity   [facility] Malva Limes, MD   1 month ago Sepsis due to urinary tract infection [facility])   [facility] Malva Limes, MD   2 months ago Recurrent UTI   [facility] Malva Limes, MD   3 months ago Chronic pain associated with significant psychosocial dysfunction   [facility] Malva Limes, MD       Future Appointments             In 2 months Willeen Niece, MD [facility]

## 2023-02-13 NOTE — Telephone Encounter (Signed)
Additional Information Required   The patient currently has access to the requested medication and a Prior Authorization is not needed for the patient/medication.

## 2023-02-14 ENCOUNTER — Telehealth: Payer: Self-pay | Admitting: *Deleted

## 2023-02-14 MED ORDER — OXYCODONE HCL 15 MG PO TABS
15.0000 mg | ORAL_TABLET | ORAL | 0 refills | Status: DC | PRN
Start: 2023-02-14 — End: 2023-03-22

## 2023-02-14 NOTE — Patient Outreach (Signed)
  Care Coordination   Follow Up Visit Note   02/14/2023 Name: Finas Olave MRN: [id number] DOB: 31-May-1981  Constandinos Deeds is a 42 y.o. year old male who sees Fisher, Demetrios Isaacs, MD for primary care. I spoke with  [name] by phone today.  What matters to the patients health and wellness today?  Obtain appointment with pain specialist.    Goals Addressed             This Visit's Progress    Effective management of muscle spasms   On track    Care Coordination Interventions: Evaluation of current treatment plan related to paraplegia with muscle spasms and patient's adherence to plan as established by provider Advised patient to call pain clinic at [facility] and/or [facility] to follow up on referral Discussed plans with patient for ongoing care management follow up and provided patient with direct contact information for care management team         SDOH assessments and interventions completed:  No     Care Coordination Interventions:  Yes, provided   Interventions Today    Flowsheet Row Most Recent Value  Chronic Disease   Chronic disease during today's visit Other  General Interventions   General Interventions Discussed/Reviewed General Interventions Reviewed, Doctor Visits  Doctor Visits Discussed/Reviewed Doctor Visits Reviewed, Specialist  [Advised of referral process to pain clinic.  State he will call PCP office to get new referral to [facility] pain clinic to see if he can get in quicker]  PCP/Specialist Visits Compliance with follow-up visit  [State appointment with spine specialist went well, discussed alterations of baclofen, but he is reluctant to have anyone other than PCP adjust.]  Education Interventions   Education Provided Provided Education  Provided Verbal Education On Medication  [He will call PCP office to discuss recommendations of medication changes from spine specialist]       Follow up plan: Follow up call scheduled for  6/28    Encounter Outcome:  Pt. Visit Completed   Kemper Durie, RN, MSN, [facility] Care Management Care Management Coordinator [phone]

## 2023-02-15 ENCOUNTER — Telehealth: Payer: Self-pay | Admitting: Family Medicine

## 2023-02-15 NOTE — Telephone Encounter (Signed)
Pt called regarding his mychart message regarding upping his dose for baclophen to 30 MG. He wants his PCP's approval/response to his message via mychart. He is requesting to speak to someone from the clinic today   [phone]

## 2023-02-15 NOTE — Telephone Encounter (Signed)
Yes, that's fine, he can go up to 30mg  four  times a day as suggest by Dr. Berline Chough

## 2023-02-15 NOTE — Telephone Encounter (Signed)
Patient advised as below.  

## 2023-02-20 ENCOUNTER — Encounter: Payer: Self-pay | Admitting: Physical Medicine and Rehabilitation

## 2023-02-20 MED ORDER — DANTROLENE SODIUM 50 MG PO CAPS: 50.0000 mg | ORAL_CAPSULE | Freq: Every day | ORAL | 5 refills | Status: DC

## 2023-02-20 NOTE — Telephone Encounter (Signed)
Pt not having any improvement in spasticity with Increase in baclofen to 30 mg QID-  Will add Dantrolene since don't think increasing Baclofen more will help Since SCI patients, don't want to add Zanaflex, because can cause orthostatic hypotension, which is wors ein SCI patients.   Will add Dantrolene which can also cause looser stools. Can usually balance out Baclofen constipation.   Dantrolene  Side effects- loose stools, sedation, and possible mild weakness  Start 50 mg nightly x 1 week Then 50 mg 2x/day x 1 week Then 50 mg 3x/day x 1 week Then 100 mg 2x/day - until dose changed  Needs CMP/LFTs/labs checked in 1 month, 3 months and every 6 months while on medication   Will need labs in 1 month, I will do other labs, unless can easily get to [facility], otherwise, I think should be effective for spasticity.

## 2023-02-21 ENCOUNTER — Ambulatory Visit (INDEPENDENT_AMBULATORY_CARE_PROVIDER_SITE_OTHER): Payer: Medicare HMO

## 2023-02-21 DIAGNOSIS — R339 Retention of urine, unspecified: Secondary | ICD-10-CM | POA: Diagnosis not present

## 2023-02-21 DIAGNOSIS — N390 Urinary tract infection, site not specified: Secondary | ICD-10-CM | POA: Diagnosis not present

## 2023-02-21 NOTE — Telephone Encounter (Signed)
Patient called, left VM to return the call to the office to speak to the NT.   Summary: possible uti   Pt called saying he feels like he is getting another UTI.  He wants to know if his wife can drop off the urine sample as usual.  CB#  [phone]

## 2023-02-21 NOTE — Telephone Encounter (Signed)
Yes, she can bring in a sample for a u/a

## 2023-02-21 NOTE — Telephone Encounter (Signed)
  Chief Complaint: recurrent UTI Symptoms: cloudy urine with foul smell Frequency: yesterday Pertinent Negatives: Patient denies spasms present  Disposition: [] ED /[] Urgent Care (no appt availability in office) / [] Appointment(In office/virtual)/ []  [facility]/ [] Home Care/ [] Refused Recommended Disposition /[]  Mobile Bus/ [x]  Follow-up with PCP Additional Notes: pt feels like he is getting another UTI and asking if wife can bring in sample. Pt doesn't understand why he keeps getting UTI since he cleans appropriately. Pt has scheduled new Urology appt but has recently just got over a UTI and is afraid if he keeps going on he will go back into hospital d/t recent sepsis. Advised pt I would send to provider for review and have nurse FU with him. Pt asking if FU can be today.   Summary: possible uti   Pt called saying he feels like he is getting another UTI.  He wants to know if his wife can drop off the urine sample as usual.  CB#  [phone]         Reason for Disposition  Bad or foul-smelling urine  Answer Assessment - Initial Assessment Questions 1. SYMPTOM: "What's the main symptom you're concerned about?" (e.g., frequency, incontinence)     Cloudy urine  2. ONSET: "When did the  sx  start?"     yesterday 3. PAIN: "Is there any pain?" If Yes, ask: "How bad is it?" (Scale: 1-10; mild, moderate, severe)      4. CAUSE: "What do you think is causing the symptoms?"     UTI 5. OTHER SYMPTOMS: "Do you have any other symptoms?" (e.g., blood in urine, fever, flank pain, pain with urination)     Urine odor  Protocols used: Urinary Symptoms-A-AH

## 2023-02-22 ENCOUNTER — Ambulatory Visit: Payer: Self-pay | Admitting: *Deleted

## 2023-02-22 ENCOUNTER — Other Ambulatory Visit: Payer: Self-pay

## 2023-02-22 ENCOUNTER — Encounter: Payer: Self-pay | Admitting: Family Medicine

## 2023-02-22 DIAGNOSIS — R82998 Other abnormal findings in urine: Secondary | ICD-10-CM

## 2023-02-22 DIAGNOSIS — N390 Urinary tract infection, site not specified: Secondary | ICD-10-CM | POA: Diagnosis not present

## 2023-02-22 LAB — POCT URINALYSIS DIPSTICK
Bilirubin, UA: NEGATIVE
Glucose, UA: NEGATIVE
Ketones, UA: NEGATIVE
Nitrite, UA: POSITIVE
Protein, UA: POSITIVE — AB
Spec Grav, UA: 1.02 (ref 1.010–1.025)
Urobilinogen, UA: 0.2 E.U./dL
pH, UA: 6 (ref 5.0–8.0)

## 2023-02-22 MED ORDER — TRIAMCINOLONE ACETONIDE 0.5 % EX OINT: TOPICAL_OINTMENT | CUTANEOUS | 0 refills | Status: DC

## 2023-02-22 MED ORDER — CEFDINIR 300 MG PO CAPS
300.0000 mg | ORAL_CAPSULE | Freq: Two times a day (BID) | ORAL | 0 refills | Status: DC
Start: 2023-02-22 — End: 2023-02-27

## 2023-02-22 NOTE — Addendum Note (Signed)
Addended by: Lily Kocher on: 02/22/2023 04:33 PM   Modules accepted: Orders

## 2023-02-22 NOTE — Telephone Encounter (Signed)
Patient was triaged for another complaint/symptoms and advised of provider response to this call: Yes, she can bring in a sample for a u/a

## 2023-02-22 NOTE — Telephone Encounter (Signed)
Summary: Redness Buttock Tx 02/05/23 Advice Needed   Pt is calling to report redness on the head of his penis and buttocks. Pt states that he was treated for this on 02/05/23. Dr. [name] sent in triamcinolone ointment (KENALOG) 0.5 % .[[id number]] Patient is not sure if more medication needs to be called in. But still concerning. Please advise         Reason for Disposition . [1] Painless rash (e.g., redness, tiny bumps, sore) AND [2] present > 24 hours  Answer Assessment - Initial Assessment Questions 1. APPEARANCE of RASH: "Describe the rash."      Rash looks same as seen in office- red areas 2. LOCATION: "Where is the rash located?"      Cleared up- 2 days ago- buttock- one spot-R on anus- quarter size spot, penis- under the hood on penis 3. NUMBER: "How many spots are there?"      2 areas 4. SIZE: "How big are the spots?" (Inches, centimeters or compare to size of a coin)      Quarter size on bottom 5. ONSET: "When did the rash start?"      2 days ago came back 6. ITCHING: "Does the rash itch?" If Yes, ask: "How bad is the itch?"  (Scale 0-10; or none, mild, moderate, severe)     Burning, no itching- but might not notice 7. PAIN: "Does the rash hurt?" If Yes, ask: "How bad is the pain?"  (Scale 0-10; or none, mild, moderate, severe)    - NONE (0): no pain    - MILD (1-3): doesn't interfere with normal activities     - MODERATE (4-7): interferes with normal activities or awakens from sleep     - SEVERE (8-10): excruciating pain, unable to do any normal activities     no 8. OTHER SYMPTOMS: "Do you have any other symptoms?" (e.g., fever)     Urinary symptoms- no spasms  Protocols used: Rash or Redness - Localized-A-AH, Penis and Scrotum Symptoms-A-AH

## 2023-02-22 NOTE — Telephone Encounter (Signed)
  Chief Complaint: rash return- same area Symptoms: rash return after treatment- small area on buttock an penis. Patient reports the triamcinolone did help and the rash went away- but it is returning in the same area. Frequency: 2 days Pertinent Negatives: Patient denies pain, itching Disposition: [] ED /[] Urgent Care (no appt availability in office) / [x] Appointment(In office/virtual)/ []  [facility]/ [] Home Care/ [] Refused Recommended Disposition /[]  Mobile Bus/ []  Follow-up with PCP Additional Notes: Patient has been scheduled a virtual visit on Monday- but wants to know if PCP would advise on treatment over the weekend.

## 2023-02-25 ENCOUNTER — Encounter: Payer: Self-pay | Admitting: Family Medicine

## 2023-02-25 ENCOUNTER — Telehealth (INDEPENDENT_AMBULATORY_CARE_PROVIDER_SITE_OTHER): Payer: Medicare HMO | Admitting: Family Medicine

## 2023-02-25 DIAGNOSIS — B356 Tinea cruris: Secondary | ICD-10-CM

## 2023-02-25 LAB — URINE CULTURE

## 2023-02-25 MED ORDER — SILVER SULFADIAZINE 1 % EX CREA
TOPICAL_CREAM | CUTANEOUS | 2 refills | Status: DC
Start: 2023-02-25 — End: 2023-08-02

## 2023-02-25 NOTE — Progress Notes (Signed)
No visit, medication refill only.

## 2023-02-26 ENCOUNTER — Encounter: Payer: Self-pay | Admitting: Physical Medicine and Rehabilitation

## 2023-02-26 ENCOUNTER — Encounter: Payer: Self-pay | Admitting: Family Medicine

## 2023-02-26 DIAGNOSIS — N390 Urinary tract infection, site not specified: Secondary | ICD-10-CM

## 2023-02-26 LAB — SPECIMEN STATUS REPORT

## 2023-02-26 LAB — URINE CULTURE

## 2023-02-27 MED ORDER — CEFDINIR 300 MG PO CAPS
300.0000 mg | ORAL_CAPSULE | Freq: Two times a day (BID) | ORAL | 0 refills | Status: AC
Start: 2023-02-27 — End: 2023-03-06

## 2023-03-01 ENCOUNTER — Encounter: Payer: Self-pay | Admitting: Family Medicine

## 2023-03-02 ENCOUNTER — Other Ambulatory Visit: Payer: Self-pay | Admitting: Family Medicine

## 2023-03-02 DIAGNOSIS — G894 Chronic pain syndrome: Secondary | ICD-10-CM

## 2023-03-04 ENCOUNTER — Telehealth: Payer: Self-pay | Admitting: Family Medicine

## 2023-03-04 ENCOUNTER — Telehealth: Payer: Self-pay

## 2023-03-04 DIAGNOSIS — S31809A Unspecified open wound of unspecified buttock, initial encounter: Secondary | ICD-10-CM

## 2023-03-04 DIAGNOSIS — G8220 Paraplegia, unspecified: Secondary | ICD-10-CM

## 2023-03-04 DIAGNOSIS — G894 Chronic pain syndrome: Secondary | ICD-10-CM

## 2023-03-04 NOTE — Telephone Encounter (Signed)
Copied from CRM [id number]. Topic: Referral - Request for Referral >> Mar 04, 2023 11:32 AM Clide Dales wrote: Has patient seen PCP for this complaint? Yes.   *If NO, is insurance requiring patient see PCP for this issue before PCP can refer them? Referral for which specialty: Wound Care Preferred provider/office: [facility] Reason for referral: Pressure sore opened

## 2023-03-04 NOTE — Addendum Note (Signed)
Addended by: Malva Limes on: 03/04/2023 04:42 PM   Modules accepted: Orders

## 2023-03-04 NOTE — Telephone Encounter (Signed)
Medication already requested in a refill encounter 03/02/23 and routed to provider on 03/04/23, pending approval.

## 2023-03-04 NOTE — Telephone Encounter (Signed)
Medication Refill - Medication: methadone (DOLOPHINE) 10 MG tablet    Has the patient contacted their pharmacy? No. (Agent: If no, request that the patient contact the pharmacy for the refill. If patient does not wish to contact the pharmacy document the reason why and proceed with request.) (Agent: If yes, when and what did the pharmacy advise?)  Preferred Pharmacy (with phone number or street name):  [facility] - [place], [place] - [address] Phone: [phone]  Fax: [phone]     Has the patient been seen for an appointment in the last year OR does the patient have an upcoming appointment? Yes.    Agent: Please be advised that RX refills may take up to 3 business days. We ask that you follow-up with your pharmacy.

## 2023-03-04 NOTE — Telephone Encounter (Signed)
Please advise 

## 2023-03-06 ENCOUNTER — Telehealth: Payer: Self-pay | Admitting: Family Medicine

## 2023-03-06 DIAGNOSIS — G894 Chronic pain syndrome: Secondary | ICD-10-CM

## 2023-03-06 NOTE — Telephone Encounter (Signed)
Medication Refill - Medication: methadone (DOLOPHINE) 10 MG tablet   Has the patient contacted their pharmacy? No.  Preferred Pharmacy (with phone number or street name):  [facility] - [place], [place] - [address] Phone: [phone]  Fax: [phone]     Has the patient been seen for an appointment in the last year OR does the patient have an upcoming appointment? Yes.    Agent: Please be advised that RX refills may take up to 3 business days. We ask that you follow-up with your pharmacy.

## 2023-03-06 NOTE — Telephone Encounter (Signed)
Pharmacy sent request on 03/02/23. Routed to provider on 03/04/23. This is patients second time calling to request the medication refill as well.

## 2023-03-07 ENCOUNTER — Encounter: Payer: Self-pay | Admitting: Family Medicine

## 2023-03-08 ENCOUNTER — Ambulatory Visit: Payer: Self-pay

## 2023-03-08 ENCOUNTER — Other Ambulatory Visit: Payer: Self-pay

## 2023-03-08 DIAGNOSIS — N390 Urinary tract infection, site not specified: Secondary | ICD-10-CM

## 2023-03-08 NOTE — Telephone Encounter (Signed)
That's fine, she can bring in a sample to send for culture

## 2023-03-08 NOTE — Telephone Encounter (Signed)
Chief Complaint: Bladder pain Symptoms: urgency, chills, leg spasms Frequency: onset yesterday Pertinent Negatives: Patient denies urine odor, urine discoloration, other symptoms Disposition: [] ED /[] Urgent Care (no appt availability in office) / [x] Appointment(In office/virtual)/ []  [facility]/ [] Home Care/ [x] Refused Recommended Disposition /[] [facility]/ []  Follow-up with PCP Additional Notes: Patient says he self caths and he's been having urgency and when he caths, no urine comes out. He says he would not like to come in, but have his wife bring a urine sample to test to see if the UTI that was treated is still there. He says he reached out to the urologist and asked for an additional round of the antibiotics, but hasn't picked up to start taking. I advised he should come in the office today to see a provider, he didn't want to do that, says Dr. [name] usually has his wife to bring a sample because it's hard for him to get around. Advised I will send this to Dr. [name] and someone will call with his recommendation. He says he sent a MyChart message on yesterday with all the information and also updated it today with a urologist who specializes in spinal cord injuries and he would like Dr. [name] to refer to him, since the provider he was seeing has left.    Reason for Disposition  Urinating more frequently than usual (i.e., frequency)  Answer Assessment - Initial Assessment Questions 1. SYMPTOM: "What's the main symptom you're concerned about?" (e.g., frequency, incontinence)     Pain in bladder, urgency 2. ONSET: "When did the symptoms start?"     Yesterday 3. PAIN: "Is there any pain?" If Yes, ask: "How bad is it?" (Scale: 1-10; mild, moderate, severe)     4-5 4. CAUSE: "What do you think is causing the symptoms?"     UTI 5. OTHER SYMPTOMS: "Do you have any other symptoms?" (e.g., blood in urine, fever, flank pain, pain with urination)     Lower abd pain, chills,  leg spasms  Protocols used: Urinary Symptoms-A-AH

## 2023-03-08 NOTE — Telephone Encounter (Signed)
Patient advised.

## 2023-03-11 LAB — URINE CULTURE

## 2023-03-12 ENCOUNTER — Ambulatory Visit: Payer: Medicare HMO | Attending: Family Medicine

## 2023-03-12 DIAGNOSIS — G8220 Paraplegia, unspecified: Secondary | ICD-10-CM | POA: Insufficient documentation

## 2023-03-12 DIAGNOSIS — S24109S Unspecified injury at unspecified level of thoracic spinal cord, sequela: Secondary | ICD-10-CM | POA: Diagnosis not present

## 2023-03-12 DIAGNOSIS — G8222 Paraplegia, incomplete: Secondary | ICD-10-CM | POA: Diagnosis not present

## 2023-03-12 NOTE — Therapy (Addendum)
OUTPATIENT PHYSICAL THERAPY WHEELCHAIR EVALUATION   Patient Name: Justin [name] MRN: [id number] DOB:Sep 24, 1980, 42 y.o., male Today's Date: 03/26/2023  END OF SESSION:    Past Medical History:  Diagnosis Date   Arthritis    Asthma    Chronic anxiety    Chronic depression    Chronic pain syndrome    secondary to fracture T9 and L3 with extensive spinal surgeery   Postoperative wound infection of right hip 2012   Rheumatic fever    Past Surgical History:  Procedure Laterality Date   BACK SURGERY  2000   Fracture T9 and L3 with paraplegia   Patient Active Problem List   Diagnosis Date Noted   Neurogenic bowel 02/13/2023   Wheelchair dependence 02/13/2023   Constipation due to opioid therapy 02/13/2023   Hypoglycemia 01/03/2023   Pressure injury of skin 01/03/2023   Thrombocytopenia (HCC) 01/03/2023   Elevated LFTs 01/03/2023   Sepsis due to urinary tract infection (HCC) 12/29/2022   Sinus tachycardia 10/02/2022   Infection due to urethral catheter (HCC) 04/22/2020   Spinal cord injury, thoracic region (HCC) 11/15/2017   Chronic, continuous use of opioids 04/27/2016   Depression 11/30/2015   Chronic hoarseness 10/26/2015   Hypothyroidism 10/26/2015   Elevated transaminase level 08/10/2015   Spasticity 07/30/2015   Paraplegia (HCC) 07/21/2015   Neurogenic bladder 04/28/2015   Infection due to drug-resistant organism 03/22/2015   Recurrent UTI 03/11/2015   Chronic pain associated with significant psychosocial dysfunction 02/18/2015   ADD (attention deficit disorder) 02/17/2015   Anxiety 02/17/2015   Chronic constipation 02/17/2015   Bed sore on buttock 02/17/2015   Abscess, dental 02/17/2015   Dermatitis, eczematoid 02/17/2015   Dizziness 02/17/2015   Esophageal reflux 02/17/2015   Hypesthesia 02/17/2015   Decubitus ulcer 02/17/2015   Muscle spasticity 02/17/2015   Concussion and swelling of spinal cord 02/17/2015   Body tinea 02/17/2015   GERD  (gastroesophageal reflux disease) 02/17/2015    PCP:    Malva Limes, MD    REFERRING PROVIDER:   Genice Rouge, MD    REFERRING DIAG:  G82.20 (ICD-10-CM) - Paraplegia (HCC)  R25.2 (ICD-10-CM) - Spasticity    THERAPY DIAG:  Paraplegia, incomplete (HCC)  ONSET DATE: June 2000  Rationale for Evaluation and Treatment: Rehabilitation  SUBJECTIVE:                                                                                                                                                                                            PAIN:  Are you having pain? Yes: Pt can have painful spasms in BLE, waiting to get into pain clinic  PRECAUTIONS: Fall and autonomic dysreflexia   PLOF: Independent  PATIENT GOALS: get new wheelchair, be able to go back to regular activities/get out more with new chair/improve mobility   PATIENT INFORMATION: This Evaluation form will serve as the LMN for the following suppliers:  Supplier: [employer] Contact Person: Osa Craver PTA, [employer] Phone: [phone]   Reason for Referral: paraplegia, spasticity, pt here for manual wheelchair Patient/caregiver Goals: get new wheelchair  Patient was seen for face-to-face evaluation for new manual wheelchair.  Also present was  Osa Craver PTA, [employer] and Lacinda Axon [employer]/SMS, CRTs to discuss recommendations and wheelchair options.  Further paperwork was completed and sent to vendor.  Patient appears to qualify for manual wheelchair at this time per objective findings.   MEDICAL HISTORY: PMH significant for the following: paraplegia, bed sore on buttock, muscle spasticity, decubitus ulcer, recurrent UTI, neurogenic bladder, neurogenic bowel, chronic pain syndrome please see above for full PMH Diagnosis: paraplegia, spasticity Primary Diagnosis Onset: June 2000 [] Progressive Disease Relevant Past and Future Surgeries: back surgery 2000 (fracture T9 and L3 paraplegia) Height: 6'4" Weight: 209  lbs  Relevant History including falls: The pt is a pleasant 42 yo male who presents to PT wheelchair evaluation in manual chair. Pt with SCI since 2000: incomplete thoracic T9 SCI with L3 injury as well per chart. He reports he has had current wheelchair for at least 10 years. Pt would like a new manual wheelchair and reports his current one is breaking down, although he has tried to repair it in the past. Due to this, the pt has been less active and reports he has had to spend more time in his bed as a result. His current WC also does not accommodate for the recent increased spasticity in his legs or BLE muscle spasm; pt bilateral ankles appear fixed in a plantarflexed position and hang beyond foot rests/plate. Pt feels both his current WC and pain levels are now causing a decrease in his mobility. He is waiting to get into a pain clinic to address part of the issue but reports his current WC is a limiting factor. The pt reports he can develop autonomic dysreflexia at times. He also reports current open pressure wound on R hip, but states it is healing and he is self-treating it. He has worked with wound care in the past, but does not have an appointment to address current wound for a while. Pt previously moved to [place] to take care of his mother who has since passed away. Pt's wife present with him for evaluation.      HOME ENVIRONMENT: [x] House  [] Condo/town home  [] Apartment  [] Assisted Living    [] Lives Alone [x]  Lives with Others   spouse                                                 Hours with caregiver:   [x] Home is accessible to patient            Stairs  [] Yes []  No     Ramp [x] Yes [] No Comments:  single level home, reports some of the doors and hallways are narrow, had to take the door off the bathroom to accommodate for chair, transfers himself in the tub   COMMUNITY ADL: TRANSPORTATION:  [x] Car    [] Zenaida Niece    [] Public Transportation    [] Adapted w/c Lift   []   Ambulance   [] Other:        [] Sits in wheelchair during transport                                          Other: Pt drives himself but his wife does accompany him in case he has leg spasms, has hand controls in car                   FUNCTIONAL/SENSORY PROCESSING SKILLS:  Handedness:   [x] Right     [] Left    [] NA  Comments:                                 Functional Processing Skills for Wheeled Mobility [x] Processing Skills are adequate for safe wheelchair operation  Areas of concern than may interfere with safe operation of wheelchair none Description of problem   []  Attention to environment     [] Judgment     []  Hearing  []  Vision or visual processing    [] Motor Planning  []  Fluctuations in Behavior                                                N/a   VERBAL COMMUNICATION: [x] WFL receptive [x]  WFL expressive [] Understandable  [] Difficult to understand  [] non-communicative []  Uses an augmented communication device    CURRENT SEATING / MOBILITY: Current Mobility Base:   [] None  [] Dependent  [x] Manual  [] Scooter  [] Power   Type of Control:                       Manufacturer:                         Size:                         Age:                           Current Condition of Mobility Base:  61+ years old                                                                                               Current Wheelchair - [employer] Q7 components:  Describe posture in present seating system:  not currently accommodating for what appears to be PF contractures, pt with slight sacral sitting, adduction   SENSATION and SKIN ISSUES: Sensation [] Intact [x] Impaired [] Absent   Level of sensation: Pt states sensation "like countries on a map"   sensation impaired in BLE                        Pressure Relief: Able to perform effective pressure relief :   [x] Yes  []  No Method:  use of BUE                                                                              If not, Why?:                                                                          Skin Issues/Skin Integrity Current Skin Issues   [x] Yes [] No  [] Intact []  Red area [x]  Open Area  [] Scar Tissue [] At risk from prolonged sitting  Where    Pt reports on R hip                          History of Skin Issues   [x] Yes [] No  Where                                         When                                               Hx of skin flap surgeries [] Yes [] No  Where                  When                                                  Limited sitting tolerance [x] Yes [] No Hours spent sitting in wheelchair daily:   does report limited sitting tolerance due to R hip arthritis and current wound on R hip                                                     Complaint of Pain:  Please describe:  Pt reports he developed pressure sore due to friction from hospital bed while in hospital. He says the wound is healing and he does have a future appointment with wound  care. Pt also reports arthritis related pain and muscle spasm pain                  Swelling/Edema: Pt reports no swelling                                                                                                                                             Exhibits ability to perform scoot transfer and sit indep on mat table from WC<>mat table using BUE    ADL STATUS (in reference to wheelchair use):  Indep Assist Unable Indep with Equip Not assessed Comments  Dressing   x                                                                      Eating        x                                                                                                                      Toileting   x                                                                                                                            Bathing      x  Grooming/ Hygiene       x                                                                                                                       Meal Prep                    x                                         He reports his wife typically prepares meals                                                             IADLS  x                                                                                                                Bowel Management: [x] Continent  [] Incontinent  [] Accidents Comments:                                                  Bladder Management: [] Continent  [x] Incontinent  [] Accidents Comments:       pt self-caths, reports frequent bladder infections                                   WHEELCHAIR SKILLS: Manual w/c Propulsion: [x] UE or LE strength and endurance sufficient to participate in ADLs using manual wheelchair *UE strength is sufficient Arm :  [x] left [x] right  [] Both                                   Foot:   [] left [] right  [] Both  Distance:   Operate Scooter: []  Strength, hand grip, balance and transfer appropriate for use [] Living environment is accessible for use of scooter  Operate Power w/c:  []  Std. Joystick   []  Alternative Controls Indep []  Assist []  Dependent/ Unable []  N/A []  [] Safe          []   Functional      Distance:                Bed confined without wheelchair [x]  Yes []  No   STRENGTH/RANGE OF MOTION:  Range of Motion Strength  Shoulder                  WFL    bilat                                    5/5   BUE                                                Elbow         WFL      bilat                                      5/5  BUE                                                         Wrist/Hand         WFL   bilat                                                         5/5  BUE                                                                   Hip                                     Impaired bilat per report and observation during evaluation                              Impaired                                   Knee        Impaired bilat                                                        impaired  Ankle Impaired appears to have PF contracutre bilat      impaired                                                                MOBILITY/BALANCE:  []  Patient is totally dependent for mobility                                                                                               Balance Transfers Ambulation  Sitting Balance: Standing Balance: [x]  Independent []  Independent/Modified Independent  []  WFL     []  WFL []  Supervision []  Supervision  [x]  Uses UE for balance with perturbation  []  Supervision []  Min Assist []  Ambulates with Assist                           []  Min Assist []  Min assist []  Mod Assist []  Ambulates with Device:  []  RW   []  StW   []  Cane   []                 []  Mod Assist []  Mod assist []  Max assist   []  Max Assist []  Max assist []  Dependent []  Indep. Short Distance Only  []  Unable [x]  Unable []  Lift / Sling Required Distance (in feet)                             []  Sliding board [x]  Unable to Ambulate: (Explain: paraplegia   Cardio Status:  [x] Intact  []  Impaired   []  NA           accident                   Respiratory Status:  [x] Intact   [] Impaired   [] NA                                     Orthotics/Prosthetics:     Comments (Address manual vs power w/c vs scooter):           The pt is a pleasant 42 y/o male presenting to wheelchair evaluation for new manual wheelchair. Pt exhibits sufficient BUE strength for propulsion of manual chair and was previously active in current manual WC until development of R hip wound, increased BLE spasticity, and functional breakdown of current wheelchair, which is 3+ years old and no longer properly  accommodates postural changes due to spasticity. Pt able to sustain upright seated posture without perturbation, but does need to balance self using BUE with perturbations. The pt would benefit from a new manual wheelchair in order to increase functional mobility, improve posture in chair, decrease risk of future pressure wounds, and increase independence with ADLS.  Anterior / Posterior Obliquity Rotation-Pelvis  PELVIS    [] Neutral  [x]  Posterior  []  Anterior     [x] WFL  [] Right Elevated  [] Left Elevated   [x] WFL  [] Right Anterior []   Left Anterior    []  Fixed [x]  Partly Flexible []  Flexible  []  Other  []  Fixed  []  Partly Flexible  []  Flexible []  Other  []  Fixed  []  Partly Flexible  []  Flexible []  Other  TRUNK [x] WFL [] Thoracic Kyphosis [] Lumbar Lordosis   [x]  WFL [] Convex Right [] Convex Left   [] c-curve [] s-curve [] multiple  [x]  Neutral []  Left-anterior []  Right-anterior    []  Fixed []  Flexible []  Partly Flexible       Other  []  Fixed []  Flexible []  Partly Flexible []  Other  []  Fixed           []  Flexible []  Partly Flexible []  Other   Position Windswept   HIPS  []  Neutral []  Abduct [x]  ADduct [x]  Neutral []  Right []  Left       []  Fixed  [x]  Partly Flexible             []  Dislocated []  Flexible []  Subluxed    []  Fixed []  Partly Flexible  []  Flexible []  Other              Foot Positioning Knee Positioning   Knees and  Feet  []  WFL [x] Left [x] Right []  WFL [x] Left [x] Right   KNEES ROM concerns: ROM concerns: increased valgus bilat   & Dorsi-Flexed                    [] Lt [] Rt                                  FEET Plantar Flexed                  [x] Lt [x] Rt     Inversion                    [] Lt [] Rt     Eversion                    [] Lt [] Rt    HEAD [x]  Functional [x]  Good Head Control   & []  Flexed         []  Extended []  Adequate Head Control   NECK []  Rotated  Lt  []  Lat Flexed  Lt []  Rotated  Rt []  Lat Flexed Rt []  Limited Head Control    []  Cervical Hyperextension []  Absent  Head Control    SHOULDERS ELBOWS WRIST& HAND         Left     Right    Left     Right  U/E [x] Functional  Left            [x] Functional  Right            [facility]             [facility]        [] Fisting             [] Fisting    WFL bilat   [] elevated Left [] depressed  Left [] elevated Right [] depressed  Right      [] protracted Left [] retracted Left [] protracted Right [] retracted Right [] subluxed  Left              [] subluxed  Right  Goals for Wheelchair Mobility  [x]  Independence with mobility in the home with motor related ADLs (MRADLs)  [x]  Independence with MRADLs in the community []  Provide dependent mobility  []  Provide recline     [] Provide tilt   Goals for Seating system [x]  Optimize pressure distribution [x]  Provide support needed to facilitate function or safety [x]  Provide corrective forces to assist with maintaining or improving posture [x]  Accommodate client's posture: current seated postures and positions are not flexible or will not tolerate corrective forces [x]  Client to be independent with relieving pressure in the wheelchair [] Enhance physiological function such as breathing, swallowing, digestion  Simulation ideas/Equipment trials:                                                                                                State why other equipment was unsuccessful:    The pt is a pleasant 42 y/o male presenting to wheelchair evaluation for new manual wheelchair. Pt exhibits sufficient BUE strength for propulsion of manual chair and was previously active in current manual WC until development of R hip wound, increased BLE spasticity, and functional breakdown of current wheelchair, which is 19+ years old and no longer properly accommodates postural changes due to spasticity. Pt able to sustain upright seated posture without perturbation, but does need to balance self  using BUE with perturbations. The pt would benefit from a new manual wheelchair in order to increase functional mobility, improve posture in chair, decrease risk of future pressure wounds, and increase independence with ADLS. Please refer to the justifications section below for details regarding necessary features for new manual wheelchair to accommodate postural changes due to spasticity,  allow for safer mobility, prevent autonomic dysreflexia triggers, and prevent further wound development and skin breakdown.                                                                          MOBILITY BASE RECOMMENDATIONS and JUSTIFICATION: MOBILITY COMPONENT JUSTIFICATION  Manufacturer:   [employer]        Model:  aeroz           Size: Width 17"          Seat Depth  18"           [x] provide transport from point A to B [x] promote Indep mobility  [x] is not a safe, functional ambulator [x] walker or cane inadequate [x] non-standard width/depth necessary to accommodate anatomical measurement []                             [x] Manual Mobility Base [x] non-functional ambulator       [] Scooter/POV  [] can safely operate  [] can safely transfer   [] has adequate trunk stability  [] cannot functionally propel manual w/c  [] Power Mobility Base  [] non-ambulatory  [] cannot functionally propel manual wheelchair  []   cannot functionally and safely operate scooter/POV [] can safely operate and willing to  [] Stroller Base [] infant/child  [] unable to propel manual wheelchair [] allows for growth [] non-functional ambulator [] non-functional UE [] Indep mobility is not a goal at this time  [] Tilt  [] Forward                   [] Backward                  [] Powered tilt              [] Manual tilt  [] change position against gravitational force on head and shoulders  [] change position for pressure relief/cannot weight shift [] transfers  [] management of tone [] rest periods [] control edema [] facilitate postural control  []                                        [] Recline  [] Power recline on power base [] Manual recline on manual base  [] accommodate femur to back angle  [] bring to full recline for ADL care  [] change position for pressure relief/cannot weight shift [] rest periods [] repositioning for transfers or clothing/diaper /catheter changes [] head positioning  [x] Lighter weight required [x] self- propulsion  [] lifting []                                                 [] Heavy Duty required [] user weight greater than 250# [] extreme tone/ over active movement [] broken frame on previous chair []                                     [x]  Back - folding J 3 shallow mount  []  Angle Adjustable []  Custom molded                           [x] postural control [x] control of tone/spasticity - pt reports spasticity can trigger autonomic dysreflexia  [x] accommodation of range of motion [] UE functional control [] accommodation for seating system []                                          [] provide lateral trunk support [] accommodate deformity [x] provide posterior trunk support [x] provide lumbar/sacral support [] support trunk in midline [x] Pressure relief over spinal processes (pt hx of wounds and current wound)  [x]  Seat Cushion - J fusion with air insert                        [x] impaired sensation  [x] decubitus ulcers present [x] history of pressure ulceration [] prevent pelvic extension [] low maintenance  [x] stabilize pelvis  [] accommodate obliquity [] accommodate multiple deformity [x] neutralize lower extremity position [x] increase pressure distribution []                                           []  Pelvic/thigh support  []  Lateral thigh guide []  Distal medial pad  []  Distal lateral pad []  pelvis in neutral [] accommodate pelvis []  position upper legs []  alignment []  accommodate ROM []  decrease adduction [] accommodate tone []   removable for transfers [] decrease abduction  []  Lateral trunk Supports []  Lt     []  Rt  [] decrease lateral trunk leaning [] control tone [] contour for increased contact [] safety  [] accommodate asymmetry []                                                 [x]  Mounting hardware  [] lateral trunk supports  [x] back   [] seat [] headrest      []  thigh support [] fixed   [] swing away [] attach seat platform/cushion to w/c frame [x] attach back cushion to w/c frame [] mount postural supports [] mount headrest  [] swing medial thigh support away [] swing lateral supports away for transfers  []                                                     Armrests  [] fixed [] adjustable height [] removable   [] swing away  [] flip back   [] reclining [] full length pads [] desk    [] pads tubular  [] provide support with elbow at 90   [] provide support for w/c tray [] change of height/angles for variable activities [] remove for transfers [] allow to come closer to table top [] remove for access to tables []                                               Hangers/ Leg rests  [] 60 [x] 75 [] 90 [] elevating [] heavy duty  [] articulating [x] fixed [] lift off [] swing away     [] power [x] provide LE support  [] accommodate to hamstring tightness [] elevate legs during recline   [] provide change in position for Legs [x] Maintain placement of feet on footplate [] durability [x] enable transfers [] decrease edema [] Accommodate lower leg length [x]       Assist in positioning to prevent muscle spasms, skin ulceration                                   Foot support Footplate    [] Lt  []  Rt  []  Center mount [x] flip up                            [x] depth/angle adjustable [] Amputee adapter    []  Lt     []  Rt [x] provide foot support [x] accommodate to ankle ROM [x] transfers [] Provide support for residual extremity []  allow foot to go under wheelchair base []  decrease tone  []                                                 []  Ankle strap/heel loops [] support foot on foot support [] decrease extraneous movement [] provide input  to heel  [] protect foot  Tires: [] pneumatic  [x] flat free inserts  [] solid  [x] decrease maintenance  [x] prevent frequent flats [x] increase shock absorbency [] decrease pain from road shock [x] decrease spasms from road shock []                                              []   Headrest  [] provide posterior head support [] provide posterior neck support [] provide lateral head support [] provide anterior head support [] support during tilt and recline [] improve feeding   [] improve respiration [] placement of switches [] safety  [] accommodate ROM  [] accommodate tone [] improve visual orientation  []  Anterior chest strap []  Vest []  Shoulder retractors  [] decrease forward movement of shoulder [] accommodation of TLSO [] decrease forward movement of trunk [] decrease shoulder elevation [] added abdominal support [] alignment [] assistance with shoulder control  []                                               Pelvic Positioner [] Belt [] SubASIS bar [] Dual Pull [] stabilize tone [] decrease falling out of chair/ **will not Decrease potential for sliding due to pelvic tilting [] prevent excessive rotation [] pad for protection over boney prominence [] prominence comfort [] special pull angle to control rotation []                                                  Upper ExtremitySupport  [] L   []  R [] Arm trough   [] hand support []  tray       [] full tray [] swivel mount [] decrease edema      [] decrease subluxation   [] control tone   [] placement for AAC/Computer/EADL [] decrease gravitational pull on shoulders [] provide midline positioning [] provide support to increase UE function [] provide hand support in natural position [] provide work surface   POWER WHEELCHAIR CONTROLS  [] Proportional  [] Non-Proportional Type                                      [] Left  [] Right [] provides access for controlling wheelchair   [] lacks motor control to operate proportional drive control [] unable to understand  proportional controls  Actuator Control Module  [] Single  [] Multiple   [] Allow the client to operate the power seat function(s) through the joystick control   [] Safety Reset Switches [] Used to change modes and stop the wheelchair when driving in latch mode    [] Upgraded Electronics   [] programming for accurate control [] progressive Disease/changing condition [] non-proportional drive control needed [] Needed in order to operate power seat functions through joystick control   [] Display box [] Allows user to see in which mode and drive the wheelchair is set  [] necessary for alternate controls    [] Digital interface electronics [] Allows w/c to operate when using alternative drive controls  [] ASL Head Array [] Allows client to operate wheelchair  through switches placed in tri-panel headrest  [] Sip and puff with tubing kit [] needed to operate sip and puff drive controls  [] Upgraded tracking electronics [] increase safety when driving [] correct tracking when on uneven surfaces  [] Mount for switches or joystick [] Attaches switches to w/c  [] Swing away for access or transfers [] midline for optimal placement [] provides for consistent access  [] Attendant controlled joystick plus mount [] safety [] long distance driving [] operation of seat functions [] compliance with transportation regulations []                                            MANUAL: Rear wheel placement/Axle adjustability - 2 deg camber  []   None [] semi adjustable [x] fully adjustable  [x] improved UE access to wheels [x] improved stability [] changing angle in space for improvement of postural stability [] 1-arm drive access [] amputee pad placement []                                Wheel rims/ hand rims  [x] metal   [] plastic coated [] oblique projections           [] vertical projections [x] Provide ability to propel manual wheelchair  []  Increase self-propulsion with hand weakness/decreased grasp  Push handles [] extended   [] angle  adjustable              [] standard [] caregiver access [] caregiver assist [] allows "hooking" to enable increased ability to perform ADLs or maintain balance  One armed device   [] Lt   [] Rt [] enable propulsion of manual wheelchair with one arm   []                                            Brake/wheel lock extension [x]  Lt   [x]  Rt [x] increase indep in applying wheel locks   [x] Side guards [x] prevent clothing getting caught in wheel or becoming soiled [x]  prevent skin tears/abrasions  Battery:                                            [] to power wheelchair                                                         Other:    [x] anti-tippers                                     [x]      Necessary for  pt safety to prevent wheelchair tipping over backwards                                                                          The above equipment has a life- long use expectancy. Growth and changes in medical and/or functional conditions would be the exceptions. This is to certify that the therapist has no financial relationship with durable medical provider or manufacturer. The therapist will not receive remuneration of any kind for the equipment recommended in this evaluation.   Patient has mobility limitation that significantly impairs safe, timely participation in one or more mobility related ADL's. (bathing, toileting, feeding, dressing, grooming, moving from room to room)  [x]  Yes []  No  Will mobility device sufficiently improve ability to participate and/or be aided in participation of MRADL's?      [x]  Yes []  No  Can limitation be compensated for with use of a cane or walker?                                    []   Yes [x]  No  Does patient or caregiver demonstrate ability/potential ability & willingness to safely use the mobility device?    [x]  Yes []  No  Does patient's home environment support use of recommended mobility device?            [x]  Yes []  No  Does patient have sufficient upper  extremity function necessary to functionally propel a manual wheelchair?     [x]  Yes []  No  Does patient have sufficient strength and trunk stability to safely operate a POV (scooter)?                                  []  Yes []  No   Does patient need additional features/benefits provided by a power wheelchair for MRADL's in the home?        []  Yes []  No  Does the patient demonstrate the ability to safely use a power wheelchair?                   []  Yes []  No     Physician's Name Printed:                                                        Physician's Signature:  Date:     This is to certify that I, the above signed therapist have the following affiliations: []  This DME provider []  Manufacturer of recommended equipment []  Patient's long term care facility [x]  None of the above  Therapist Name/Signature:  Temple Pacini PT, DPT                                          Date: 03/12/2023, addendum 03/26/2023  OBJECTIVE IMPAIRMENTS: decreased balance, decreased coordination, decreased mobility, decreased ROM, decreased strength, hypomobility, increased muscle spasms, impaired flexibility, impaired sensation, impaired tone, postural dysfunction, and pain.   ACTIVITY LIMITATIONS: sitting, continence, and pt with paraplegia, general mobility currently limited due to wound, pain, and current WC  PARTICIPATION LIMITATIONS: meal prep, shopping, and community activity  PERSONAL FACTORS: Past/current experiences, Time since onset of injury/illness/exacerbation, and 3+ comorbidities: PMH significant for the following: paraplegia, bed sore on buttock, muscle spasticity, decubitus ulcer, recurrent UTI, neurogenic bladder, neurogenic bowel, chronic pain syndrome please see above for full PMH  are also affecting patient's functional outcome.   REHAB POTENTIAL: Good  CLINICAL DECISION MAKING: Evolving/moderate complexity  EVALUATION COMPLEXITY: Moderate  GOALS: Target date:  03/12/2023     The pt  will complete wheelchair evaluation and will verbalize understanding of recommendations.  Baseline: completed and MET Goal status: MET  PLAN:  PT FREQUENCY: one time visit  PT DURATION: other: one time visit  PLANNED INTERVENTIONS: Therapeutic activity.  PLAN FOR NEXT SESSION: one time visit/wheelchair eval   Baird Kay, PT 03/26/2023, 6:58 PM

## 2023-03-13 LAB — URINE CULTURE

## 2023-03-15 ENCOUNTER — Other Ambulatory Visit: Payer: Self-pay | Admitting: Family Medicine

## 2023-03-15 ENCOUNTER — Ambulatory Visit: Payer: Self-pay | Admitting: *Deleted

## 2023-03-15 ENCOUNTER — Encounter: Payer: Self-pay | Admitting: Family Medicine

## 2023-03-15 ENCOUNTER — Ambulatory Visit: Payer: Self-pay

## 2023-03-15 DIAGNOSIS — S24109S Unspecified injury at unspecified level of thoracic spinal cord, sequela: Secondary | ICD-10-CM

## 2023-03-15 DIAGNOSIS — M62838 Other muscle spasm: Secondary | ICD-10-CM

## 2023-03-15 DIAGNOSIS — G894 Chronic pain syndrome: Secondary | ICD-10-CM

## 2023-03-15 DIAGNOSIS — R252 Cramp and spasm: Secondary | ICD-10-CM

## 2023-03-15 DIAGNOSIS — N390 Urinary tract infection, site not specified: Secondary | ICD-10-CM

## 2023-03-15 DIAGNOSIS — G8220 Paraplegia, unspecified: Secondary | ICD-10-CM

## 2023-03-15 MED ORDER — CEFDINIR 300 MG PO CAPS
ORAL_CAPSULE | ORAL | 1 refills | Status: DC
Start: 2023-03-15 — End: 2023-07-12

## 2023-03-15 NOTE — Patient Outreach (Signed)
  Care Coordination   Follow Up Visit Note   03/15/2023 Name: Justin [name] MRN: [id number] DOB: [birth year]  Justin [name] is a 42 y.o. year old male who sees Justin [name], Justin Isaacs, MD for primary care. I spoke with  Justin [name] by phone today.  What matters to the patients health and wellness today?  Getting into pain clinic for management     Goals Addressed             This Visit's Progress    Effective management of muscle spasms   On track    Care Coordination Interventions: Evaluation of current treatment plan related to paraplegia with muscle spasms and patient's adherence to plan as established by provider Advised patient to call PCP to follow up on referral to new pain clinic Discussed plans with patient for ongoing care management follow up and provided patient with direct contact information for care management team         SDOH assessments and interventions completed:  No     Care Coordination Interventions:  Yes, provided   Interventions Today    Flowsheet Row Most Recent Value  Chronic Disease   Chronic disease during today's visit Other  [spasms, chronic pain]  General Interventions   General Interventions Discussed/Reviewed General Interventions Reviewed, Communication with, Doctor Visits  Doctor Visits Discussed/Reviewed Doctor Visits Reviewed, PCP, Specialist  [completed wheelchair evaluation for new DME, in the process of choosing color to order.  Wound care on 7/2, uroloty on 7/23]  PCP/Specialist Visits Compliance with follow-up visit  Communication with PCP/Specialists  [Call to [facility] pain clinic, advised they aren't taking referrals for opiate management, recommended to have referral sent to Justin [name] at [facility] specialist]       Follow up plan: Follow up call scheduled for 7/26    Encounter Outcome:  Pt. Visit Completed   Justin Durie, RN, MSN, [facility] Care Management Care Management  Coordinator [phone]

## 2023-03-15 NOTE — Telephone Encounter (Signed)
  Chief Complaint: 1.. Needing abx called in (please see assessment) and 2. Wanting a referral to Dr. Floyde Parkins urologist. Please see pt message dated 03/07/23 Symptoms: fever, muscle cramping Pertinent Negatives: Patient denies other sx Disposition: [] ED /[] Urgent Care (no appt availability in office) / [] Appointment(In office/virtual)/ []  [facility]/ [] Home Care/ [] Refused Recommended Disposition /[] [place] [facility]/ [x]  Follow-up with PCP Additional Notes: routing to [facility] Reason for Disposition  Prescription request for new medicine (not a refill)  Answer Assessment - Initial Assessment Questions 1. NAME of MEDICINE: "What medicine(s) are you calling about?"     Keflex  2. QUESTION: "What is your question?" (e.g., double dose of medicine, side effect)     Pt has 2 days worth of 500 mg Keflex enough for 2 days- pt wanting to know if E coli is susceptible to that med or does he need another abx,. Please call in to [facility] pharmacy  4. SYMPTOMS: "Do you have any symptoms?" If Yes, ask: "What symptoms are you having?"  "How bad are the symptoms (e.g., mild, moderate, severe)     Fever, muscle cramping  Protocols used: Medication Question Call-A-AH

## 2023-03-19 ENCOUNTER — Encounter: Payer: Medicare HMO | Attending: Physician Assistant | Admitting: Physician Assistant

## 2023-03-19 DIAGNOSIS — L89153 Pressure ulcer of sacral region, stage 3: Secondary | ICD-10-CM | POA: Diagnosis not present

## 2023-03-19 DIAGNOSIS — G8222 Paraplegia, incomplete: Secondary | ICD-10-CM | POA: Insufficient documentation

## 2023-03-19 DIAGNOSIS — Z8249 Family history of ischemic heart disease and other diseases of the circulatory system: Secondary | ICD-10-CM | POA: Diagnosis not present

## 2023-03-19 DIAGNOSIS — Z87440 Personal history of urinary (tract) infections: Secondary | ICD-10-CM | POA: Diagnosis not present

## 2023-03-19 DIAGNOSIS — L89313 Pressure ulcer of right buttock, stage 3: Secondary | ICD-10-CM | POA: Diagnosis not present

## 2023-03-20 ENCOUNTER — Other Ambulatory Visit: Payer: Self-pay | Admitting: Family Medicine

## 2023-03-20 DIAGNOSIS — G894 Chronic pain syndrome: Secondary | ICD-10-CM

## 2023-03-20 NOTE — Telephone Encounter (Signed)
Requested medication (s) are due for refill today: yes  Requested medication (s) are on the active medication list: yes  Last refill:  02/14/23 #180/0  Future visit scheduled: no  Notes to clinic:  Unable to refill per protocol, cannot delegate.    Requested Prescriptions  Pending Prescriptions Disp Refills   oxyCODONE (ROXICODONE) 15 MG immediate release tablet [Pharmacy Med Name: OXYCODONE HCL 15 MG TAB] 180 tablet     Sig: TAKE ONE TABLET BY MOUTH EVERY 4-5 HOURSAS NEEDED FOR PAIN. NO MORE THAN 5 IN A DAY.     Not Delegated - Analgesics:  Opioid Agonists Failed - 03/20/2023  4:28 PM      Failed - This refill cannot be delegated      Passed - Urine Drug Screen completed in last 360 days      Passed - Valid encounter within last 3 months    Recent Outpatient Visits           3 weeks ago Tinea cruris   [place] [facility] Malva Limes, MD   1 month ago Recurrent UTI   [facility] Malva Limes, MD   1 month ago Spasticity   [facility] Malva Limes, MD   2 months ago Sepsis due to urinary tract infection [facility])   [place] [facility] Malva Limes, MD   3 months ago Recurrent UTI   [facility] Malva Limes, MD       Future Appointments             In 1 month Willeen Niece, MD [facility]

## 2023-03-22 ENCOUNTER — Telehealth: Payer: Self-pay

## 2023-03-22 NOTE — Telephone Encounter (Signed)
Received call from pt. Pt states that pharmacy will not refill his oxycodone until 7/11. Pt states that pharmacy stated that this is how provider wrote the refill.  Pharmacy needs a follow up phone call from provider to allow refill, as was approved by provider today.   Pt is also following up on request for referral to :  [name] "[facility]"  to [facility] (supporting Malva Limes, MD)      03/15/23 11:03 AM Dr Scotty Court [facility] in [place] and [place]. Could you please refer me to them and let me know what they say. Thanks so much.  Please advise.

## 2023-03-24 ENCOUNTER — Encounter: Payer: Self-pay | Admitting: Family Medicine

## 2023-03-25 ENCOUNTER — Encounter: Payer: Medicare HMO | Admitting: Physician Assistant

## 2023-03-25 ENCOUNTER — Telehealth: Payer: Self-pay

## 2023-03-25 DIAGNOSIS — L89153 Pressure ulcer of sacral region, stage 3: Secondary | ICD-10-CM | POA: Diagnosis not present

## 2023-03-25 DIAGNOSIS — Z87440 Personal history of urinary (tract) infections: Secondary | ICD-10-CM | POA: Diagnosis not present

## 2023-03-25 DIAGNOSIS — L89313 Pressure ulcer of right buttock, stage 3: Secondary | ICD-10-CM | POA: Diagnosis not present

## 2023-03-25 DIAGNOSIS — Z8249 Family history of ischemic heart disease and other diseases of the circulatory system: Secondary | ICD-10-CM | POA: Diagnosis not present

## 2023-03-25 DIAGNOSIS — G8222 Paraplegia, incomplete: Secondary | ICD-10-CM | POA: Diagnosis not present

## 2023-03-25 DIAGNOSIS — R339 Retention of urine, unspecified: Secondary | ICD-10-CM | POA: Diagnosis not present

## 2023-03-25 NOTE — Progress Notes (Signed)
Justin [name] ([id number]) [id number] Page 1 of 9 Visit Report for 03/25/2023 Arrival Information Details Patient Name: Date of Service: Justin [name] [facility] N. 03/25/2023 7:45 A M Medical Record Number: [id number] Patient Account Number: [id number] Date of Birth/Sex: Treating RN: Sep 20, 1980 (42 y.o. Justin [name] Primary Care Asiah Befort: Mila Merry Other Clinician: Referring Kadyn Guild: Treating Shoshanah Dapper/Extender: Reinaldo Raddle in Treatment: 0 Visit Information History Since Last Visit Added or deleted any medications: No Patient Arrived: Wheel Chair Any new allergies or adverse reactions: No Arrival Time: 07:52 Has Dressing in Place as Prescribed: Yes Accompanied By: wife Has Compression in Place as Prescribed: Yes Transfer Assistance: None Pain Present Now: No Patient Identification Verified: Yes Secondary Verification Process Completed: Yes Patient Requires Transmission-Based Precautions: No Patient Has Alerts: No Electronic Signature(s) Signed: 03/25/2023 5:27:27 PM By: Midge Aver MSN RN CNS WTA Entered By: Midge Aver on 03/25/2023 07:53:36 -------------------------------------------------------------------------------- Clinic Level of Care Assessment Details Patient Name: Date of Service: Justin [name] [facility] N. 03/25/2023 7:45 A M Medical Record Number: [id number] Patient Account Number: [id number] Date of Birth/Sex: Treating RN: 1981-04-13 (42 y.o. Justin [name] Primary Care Emeri Estill: Mila Merry Other Clinician: Referring Parisa Pinela: Treating Marvyn Torrez/Extender: Reinaldo Raddle in Treatment: 0 Clinic Level of Care Assessment Items TOOL 4 Quantity Score X- 1 0 Use when only an EandM is performed on FOLLOW-UP visit ASSESSMENTS - Nursing Assessment / Reassessment X- 1 10 Reassessment of Co-morbidities (includes updates in patient status) X- 1 5 Reassessment of Adherence to Treatment Plan ASSESSMENTS  - Wound and Skin A ssessment / Reassessment X - Simple Wound Assessment / Reassessment - one wound 1 5 []  - 0 Complex Wound Assessment / Reassessment - multiple wounds Justin [name], Justin [name] ([id number]) [id number] Page 2 of 9 []  - 0 Dermatologic / Skin Assessment (not related to wound area) ASSESSMENTS - Focused Assessment []  - 0 Circumferential Edema Measurements - multi extremities []  - 0 Nutritional Assessment / Counseling / Intervention []  - 0 Lower Extremity Assessment (monofilament, tuning fork, pulses) []  - 0 Peripheral Arterial Disease Assessment (using hand held doppler) ASSESSMENTS - Ostomy and/or Continence Assessment and Care []  - 0 Incontinence Assessment and Management []  - 0 Ostomy Care Assessment and Management (repouching, etc.) PROCESS - Coordination of Care X - Simple Patient / Family Education for ongoing care 1 15 []  - 0 Complex (extensive) Patient / Family Education for ongoing care X- 1 10 Staff obtains Chiropractor, Records, T Results / Process Orders est []  - 0 Staff telephones HHA, Nursing Homes / Clarify orders / etc []  - 0 Routine Transfer to another Facility (non-emergent condition) []  - 0 Routine Hospital Admission (non-emergent condition) []  - 0 New Admissions / Manufacturing engineer / Ordering NPWT Apligraf, etc. , []  - 0 Emergency Hospital Admission (emergent condition) X- 1 10 Simple Discharge Coordination []  - 0 Complex (extensive) Discharge Coordination PROCESS - Special Needs []  - 0 Pediatric / Minor Patient Management []  - 0 Isolation Patient Management []  - 0 Hearing / Language / Visual special needs []  - 0 Assessment of Community assistance (transportation, D/C planning, etc.) []  - 0 Additional assistance / Altered mentation []  - 0 Support Surface(s) Assessment (bed, cushion, seat, etc.) INTERVENTIONS - Wound Cleansing / Measurement X - Simple Wound Cleansing - one wound 1 5 []  - 0 Complex Wound  Cleansing - multiple wounds X- 1 5 Wound Imaging (photographs - any number of wounds) []  - 0 Wound Tracing (instead of photographs) X- 1  5 Simple Wound Measurement - one wound []  - 0 Complex Wound Measurement - multiple wounds INTERVENTIONS - Wound Dressings X - Small Wound Dressing one or multiple wounds 1 10 []  - 0 Medium Wound Dressing one or multiple wounds []  - 0 Large Wound Dressing one or multiple wounds []  - 0 Application of Medications - topical []  - 0 Application of Medications - injection INTERVENTIONS - Miscellaneous []  - 0 External ear exam []  - 0 Specimen Collection (cultures, biopsies, blood, body fluids, etc.) []  - 0 Specimen(s) / Culture(s) sent or taken to Lab for analysis Justin [name], Justin [name] ([id number]) [id number] Page 3 of 9 []  - 0 Patient Transfer (multiple staff / Nurse, adult / Similar devices) []  - 0 Simple Staple / Suture removal (25 or less) []  - 0 Complex Staple / Suture removal (26 or more) []  - 0 Hypo / Hyperglycemic Management (close monitor of Blood Glucose) []  - 0 Ankle / Brachial Index (ABI) - do not check if billed separately X- 1 5 Vital Signs Has the patient been seen at the hospital within the last three years: Yes Total Score: 85 Level Of Care: New/Established - Level 3 Electronic Signature(s) Signed: 03/25/2023 5:27:27 PM By: Midge Aver MSN RN CNS WTA Entered By: Midge Aver on 03/25/2023 08:34:05 -------------------------------------------------------------------------------- Encounter Discharge Information Details Patient Name: Date of Service: Justin [name] [name] 03/25/2023 7:45 A M Medical Record Number: [id number] Patient Account Number: [id number] Date of Birth/Sex: Treating RN: 01/17/1981 (41 y.o. Justin [name] Primary Care Makoto Sellitto: Mila Merry Other Clinician: Referring Keyuana Wank: Treating Enez Monahan/Extender: Reinaldo Raddle in Treatment: 0 Encounter Discharge  Information Items Discharge Condition: Stable Ambulatory Status: Wheelchair Discharge Destination: Home Transportation: Private Auto Accompanied By: wife Schedule Follow-up Appointment: Yes Clinical Summary of Care: Electronic Signature(s) Signed: 03/25/2023 5:27:27 PM By: Midge Aver MSN RN CNS WTA Entered By: Midge Aver on 03/25/2023 08:35:18 -------------------------------------------------------------------------------- Lower Extremity Assessment Details Patient Name: Date of Service: Justin [name] [facility] N. 03/25/2023 7:45 A M Medical Record Number: [id number] Patient Account Number: [id number] Date of Birth/Sex: Treating RN: 1981-05-26 (42 y.o. Justin [name] Primary Care Arad Burston: Mila Merry Other Clinician: Referring Avabella Wailes: Treating Jovian Lembcke/Extender: Justin [name], Justin [name] ([id number]) [phone] Page 4 of 9 Weeks in Treatment: 0 Electronic Signature(s) Signed: 03/25/2023 5:27:27 PM By: Midge Aver MSN RN CNS WTA Entered By: Midge Aver on 03/25/2023 08:05:33 -------------------------------------------------------------------------------- Multi Wound Chart Details Patient Name: Date of Service: Justin [name] [facility] N. 03/25/2023 7:45 A M Medical Record Number: [id number] Patient Account Number: [id number] Date of Birth/Sex: Treating RN: 1981-02-01 (42 y.o. Justin [name] Primary Care Kayci Belleville: Mila Merry Other Clinician: Referring Haakon Titsworth: Treating Kerriann Kamphuis/Extender: Reinaldo Raddle in Treatment: 0 Vital Signs Height(in): 76 Pulse(bpm): 92 Weight(lbs): 200 Blood Pressure(mmHg): 140/92 Body Mass Index(BMI): 24.3 Temperature(F): 97.5 Respiratory Rate(breaths/min): 18 [5:Photos:] [N/A:N/A] Right Gluteus N/A N/A Wound Location: Pressure Injury N/A N/A Wounding Event: Pressure Ulcer N/A N/A Primary Etiology: Asthma, Pneumothorax, Arrhythmia, N/A N/A Comorbid History: History of pressure  wounds, Osteoarthritis, Paraplegia 02/13/2023 N/A N/A Date Acquired: 0 N/A N/A Weeks of Treatment: Open N/A N/A Wound Status: No N/A N/A Wound Recurrence: 2x0.5x0.1 N/A N/A Measurements L x W x D (cm) 0.785 N/A N/A A (cm) : rea 0.079 N/A N/A Volume (cm) : 56.50% N/A N/A % Reduction in A rea: 56.40% N/A N/A % Reduction in Volume: Category/Stage III N/A N/A Classification: Medium N/A N/A Exudate A mount: Serosanguineous N/A N/A Exudate Type:  red, brown N/A N/A Exudate Color: Medium (34-66%) N/A N/A Granulation A mount: Red, Pink N/A N/A Granulation Quality: Small (1-33%) N/A N/A Necrotic A mount: Fat Layer (Subcutaneous Tissue): Yes N/A N/A Exposed Structures: Fascia: No Tendon: No Muscle: No Joint: No Bone: No Small (1-33%) N/A N/A EpithelializationKHALEED, Justin [name] ([id number]) [id number] Page 5 of 9 Treatment Notes Electronic Signature(s) Signed: 03/25/2023 5:27:27 PM By: Midge Aver MSN RN CNS WTA Entered By: Midge Aver on 03/25/2023 08:06:16 -------------------------------------------------------------------------------- Multi-Disciplinary Care Plan Details Patient Name: Date of Service: Justin [name] [facility] N. 03/25/2023 7:45 A M Medical Record Number: [id number] Patient Account Number: [id number] Date of Birth/Sex: Treating RN: September 15, 1981 (42 y.o. Justin [name] Primary Care Chay Mazzoni: Mila Merry Other Clinician: Referring Illa Enlow: Treating Gaynor Ferreras/Extender: Reinaldo Raddle in Treatment: 0 Active Inactive Pressure Nursing Diagnoses: Knowledge deficit related to causes and risk factors for pressure ulcer development Knowledge deficit related to management of pressures ulcers Potential for impaired tissue integrity related to pressure, friction, moisture, and shear Goals: Patient will remain free from development of additional pressure ulcers Date Initiated: 03/19/2023 Target Resolution Date:  04/19/2023 Goal Status: Active Patient will remain free of pressure ulcers Date Initiated: 03/19/2023 Target Resolution Date: 04/19/2023 Goal Status: Active Patient/caregiver will verbalize risk factors for pressure ulcer development Date Initiated: 03/19/2023 Target Resolution Date: 04/19/2023 Goal Status: Active Patient/caregiver will verbalize understanding of pressure ulcer management Date Initiated: 03/19/2023 Target Resolution Date: 04/19/2023 Goal Status: Active Interventions: Assess: immobility, friction, shearing, incontinence upon admission and as needed Assess offloading mechanisms upon admission and as needed Assess potential for pressure ulcer upon admission and as needed Provide education on pressure ulcers Notes: Wound/Skin Impairment Nursing Diagnoses: Impaired tissue integrity Knowledge deficit related to ulceration/compromised skin integrity Goals: Patient/caregiver will verbalize understanding of skin care regimen Date Initiated: 03/19/2023 Target Resolution Date: 04/19/2023 Goal Status: Active Ulcer/skin breakdown will have a volume reduction of 30% by week 4 Date Initiated: 03/19/2023 Target Resolution Date: 04/19/2023 Goal Status: Active Justin [name], Justin [name] ([id number]) [id number] Page 6 of 9 Ulcer/skin breakdown will have a volume reduction of 50% by week 8 Date Initiated: 03/19/2023 Target Resolution Date: 05/20/2023 Goal Status: Active Ulcer/skin breakdown will have a volume reduction of 80% by week 12 Date Initiated: 03/19/2023 Target Resolution Date: 06/19/2023 Goal Status: Active Interventions: Assess patient/caregiver ability to obtain necessary supplies Assess patient/caregiver ability to perform ulcer/skin care regimen upon admission and as needed Assess ulceration(s) every visit Provide education on ulcer and skin care Treatment Activities: Skin care regimen initiated : 03/19/2023 Notes: Electronic Signature(s) Signed: 03/25/2023 5:27:27 PM  By: Midge Aver MSN RN CNS WTA Entered By: Midge Aver on 03/25/2023 08:34:24 -------------------------------------------------------------------------------- Pain Assessment Details Patient Name: Date of Service: Justin Babs [facility]in N. 03/25/2023 7:45 A M Medical Record Number: [id number] Patient Account Number: [id number] Date of Birth/Sex: Treating RN: Apr 29, 1981 (42 y.o. Justin [name] Primary Care Kiylee Thoreson: Mila Merry Other Clinician: Referring Lidiya Reise: Treating Jaxsen Bernhart/Extender: Reinaldo Raddle in Treatment: 0 Active Problems Location of Pain Severity and Description of Pain Patient Has Paino No Site Locations Pain Management and Medication Current Pain Management: Electronic Signature(s) Signed: 03/25/2023 5:27:27 PM By: Midge Aver MSN RN CNS [address] [place] ([id number]) [id number] Page 7 of 9 Entered By: Midge Aver on 03/25/2023 07:59:57 -------------------------------------------------------------------------------- Patient/Caregiver Education Details Patient Name: Date of Service: Justin [name] [facility] 7/8/2024andnbsp7:45 A M Medical Record Number: [id number] Patient Account Number: [id number] Date of Birth/Gender: Treating RN: August 15, 1981 (42 y.o.  Justin [name] Primary Care Physician: Mila Merry Other Clinician: Referring Physician: Treating Physician/Extender: Reinaldo Raddle in Treatment: 0 Education Assessment Education Provided To: Patient Education Topics Provided Wound/Skin Impairment: Handouts: Caring for Your Ulcer Methods: Explain/Verbal Responses: State content correctly Electronic Signature(s) Signed: 03/25/2023 5:27:27 PM By: Midge Aver MSN RN CNS WTA Entered By: Midge Aver on 03/25/2023 08:34:37 -------------------------------------------------------------------------------- Wound Assessment Details Patient Name: Date of Service: Justin Babs [facility] N. 03/25/2023  7:45 A M Medical Record Number: [id number] Patient Account Number: [id number] Date of Birth/Sex: Treating RN: 10-25-1980 (42 y.o. Justin [name] Primary Care Ellon Marasco: Mila Merry Other Clinician: Referring Rhaya Coale: Treating Zury Fazzino/Extender: Reinaldo Raddle in Treatment: 0 Wound Status Wound Number: 5 Primary Pressure Ulcer Etiology: Wound Location: Right Gluteus Wound Open Wounding Event: Pressure Injury Status: Date Acquired: 02/13/2023 Comorbid Asthma, Pneumothorax, Arrhythmia, History of pressure Weeks Of Treatment: 0 History: wounds, Osteoarthritis, Paraplegia Clustered Wound: No Photos LOCHLYN, PALLETT ([id number]) [id number] Page 8 of 9 Wound Measurements Length: (cm) 2 Width: (cm) 0.5 Depth: (cm) 0.1 Area: (cm) 0.785 Volume: (cm) 0.079 % Reduction in Area: 56.5% % Reduction in Volume: 56.4% Epithelialization: Small (1-33%) Wound Description Classification: Category/Stage III Exudate Amount: Medium Exudate Type: Serosanguineous Exudate Color: red, brown Foul Odor After Cleansing: No Slough/Fibrino No Wound Bed Granulation Amount: Medium (34-66%) Exposed Structure Granulation Quality: Red, Pink Fascia Exposed: No Necrotic Amount: Small (1-33%) Fat Layer (Subcutaneous Tissue) Exposed: Yes Necrotic Quality: Adherent Slough Tendon Exposed: No Muscle Exposed: No Joint Exposed: No Bone Exposed: No Treatment Notes Wound #5 (Gluteus) Wound Laterality: Right Cleanser Soap and Water Discharge Instruction: Gently cleanse wound with antibacterial soap, rinse and pat dry prior to dressing wounds Vashe 5.8 (oz) Discharge Instruction: Use vashe 5.8 (oz) as directed Peri-Wound Care Topical Primary Dressing Promogran Matrix 4.34 (in) Discharge Instruction: Moisten w/normal saline or sterile water; Cover wound as directed. Do not remove from wound bed. Secondary Dressing T Adhesive [place] Dressing, 4x4  (in/in) elfa Discharge Instruction: Apply over dressing to secure in place. Secured With Compression Wrap Compression Stockings Facilities manager) Signed: 03/25/2023 5:27:27 PM By: Midge Aver MSN RN CNS WTA Entered By: Midge Aver on 03/25/2023 08:05:23 EMMITT, MANNARINO ([id number]) [id number] Page 9 of 9 -------------------------------------------------------------------------------- Vitals Details Patient Name: Date of Service: Justin [name] [facility] N. 03/25/2023 7:45 A M Medical Record Number: [id number] Patient Account Number: [id number] Date of Birth/Sex: Treating RN: 21-May-1981 (42 y.o. Justin [name] Primary Care Fabienne Nolasco: Mila Merry Other Clinician: Referring Djuna Frechette: Treating Garold Sheeler/Extender: Reinaldo Raddle in Treatment: 0 Vital Signs Time Taken: 07:59 Temperature (F): 97.5 Height (in): 76 Pulse (bpm): 92 Weight (lbs): 200 Respiratory Rate (breaths/min): 18 Body Mass Index (BMI): 24.3 Blood Pressure (mmHg): 140/92 Reference Range: 80 - 120 mg / dl Electronic Signature(s) Signed: 03/25/2023 5:27:27 PM By: Midge Aver MSN RN CNS WTA Entered By: Midge Aver on 03/25/2023 07:59:51

## 2023-03-25 NOTE — Telephone Encounter (Signed)
Copied from CRM [id number]. Topic: Referral - Request for Referral >> Mar 22, 2023  3:29 PM Clide Dales wrote: Has patient seen PCP for this complaint? Yes.   *If NO, is insurance requiring patient see PCP for this issue before PCP can refer them? Referral for which specialty: Pain Management Preferred provider/office: [facility] in [place] with Dr. Scotty Court Reason for referral: Pain Management
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
## 2023-03-25 NOTE — Progress Notes (Signed)
Justin [name], Justin [name] ([id number]) [id number] Page 1 of 10 Visit Report for 03/19/2023 Allergy List Details Patient Name: Date of Service: [name] [facility] [name]. 03/19/2023 2:00 PM Medical Record Number: [id number] Patient Account Number: [id number] Date of Birth/Sex: Treating RN: 05-Apr-1981 (42 y.o. Justin [name] Primary Care Stacey Maura: Mila Merry Other Clinician: Referring Hodari Chuba: Treating Britzy Graul/Extender: Reinaldo Raddle in Treatment: 0 Allergies Active Allergies azithromycin morphine Ativan Demerol meperidine Allergy Notes Electronic Signature(s) Signed: 03/25/2023 5:27:27 PM By: Midge Aver MSN RN CNS WTA Entered By: Midge Aver on 03/19/2023 14:34:06 -------------------------------------------------------------------------------- Arrival Information Details Patient Name: Date of Service: [name] Justin [name]. 03/19/2023 2:00 PM Medical Record Number: [id number] Patient Account Number: [id number] Date of Birth/Sex: Treating RN: 09-Sep-1981 (42 y.o. Justin [name] Primary Care Naisha Wisdom: Mila Merry Other Clinician: Referring Aspen Deterding: Treating Jihaad Bruschi/Extender: Reinaldo Raddle in Treatment: 0 Visit Information Patient Arrived: Wheel Chair Arrival Time: 14:30 Accompanied By: wife Transfer Assistance: None Patient Identification Verified: Yes Secondary Verification Process CompletedSKEET, ROSENBOOM ([id number]) Patient Requires Transmission-Based Precautions: No Patient Has Alerts: No History Since Last Visit Added or deleted any medications: No Any new allergies or adverse reactions: No Had a fall or experienced change in activities of daily living that may affect risk of falls: No Has Dressing in Place as Prescribed: Yes Pain Present Now: Yes [id number] Page 2 of 10 Pain Present Now: Yes Electronic Signature(s) Signed: 03/19/2023 4:21:50 PM By: Midge Aver MSN RN CNS  WTA Entered By: Midge Aver on 03/19/2023 16:21:50 -------------------------------------------------------------------------------- Clinic Level of Care Assessment Details Patient Name: Date of Service: [name] [facility] [name]. 03/19/2023 2:00 PM Medical Record Number: [id number] Patient Account Number: [id number] Date of Birth/Sex: Treating RN: 1981/06/01 (42 y.o. Justin [name] Primary Care Terriyah Westra: Mila Merry Other Clinician: Referring Jameriah Trotti: Treating Kit Mollett/Extender: Reinaldo Raddle in Treatment: 0 Clinic Level of Care Assessment Items TOOL 2 Quantity Score X- 1 0 Use when only an EandM is performed on the INITIAL visit ASSESSMENTS - Nursing Assessment / Reassessment X- 1 20 General Physical Exam (combine w/ comprehensive assessment (listed just below) when performed on new pt. evals) X- 1 25 Comprehensive Assessment (HX, ROS, Risk Assessments, Wounds Hx, etc.) ASSESSMENTS - Wound and Skin A ssessment / Reassessment X - Simple Wound Assessment / Reassessment - one wound 1 5 []  - 0 Complex Wound Assessment / Reassessment - multiple wounds []  - 0 Dermatologic / Skin Assessment (not related to wound area) ASSESSMENTS - Ostomy and/or Continence Assessment and Care []  - 0 Incontinence Assessment and Management []  - 0 Ostomy Care Assessment and Management (repouching, etc.) PROCESS - Coordination of Care X - Simple Patient / Family Education for ongoing care 1 15 []  - 0 Complex (extensive) Patient / Family Education for ongoing care X- 1 10 Staff obtains Chiropractor, Records, T Results / Process Orders est []  - 0 Staff telephones HHA, Nursing Homes / Clarify orders / etc []  - 0 Routine Transfer to another Facility (non-emergent condition) []  - 0 Routine Hospital Admission (non-emergent condition) []  - 0 New Admissions / Manufacturing engineer / Ordering NPWT Apligraf, etc. , []  - 0 Emergency Hospital Admission (emergent condition) X- 1  10 Simple Discharge Coordination []  - 0 Complex (extensive) Discharge Coordination PROCESS - Special Needs []  - 0 Pediatric / Minor Patient Management []  - 0 Isolation Patient Management []  - 0 Hearing / Language / Visual special needs Justin [name], Justin [name] ([id number]) [id number] Page 3  of 10 []  - 0 Assessment of Community assistance (transportation, D/C planning, etc.) []  - 0 Additional assistance / Altered mentation []  - 0 Support Surface(s) Assessment (bed, cushion, seat, etc.) INTERVENTIONS - Wound Cleansing / Measurement X- 1 5 Wound Imaging (photographs - any number of wounds) []  - 0 Wound Tracing (instead of photographs) X- 1 5 Simple Wound Measurement - one wound []  - 0 Complex Wound Measurement - multiple wounds X- 1 5 Simple Wound Cleansing - one wound []  - 0 Complex Wound Cleansing - multiple wounds INTERVENTIONS - Wound Dressings X - Small Wound Dressing one or multiple wounds 1 10 []  - 0 Medium Wound Dressing one or multiple wounds []  - 0 Large Wound Dressing one or multiple wounds []  - 0 Application of Medications - injection INTERVENTIONS - Miscellaneous []  - 0 External ear exam []  - 0 Specimen Collection (cultures, biopsies, blood, body fluids, etc.) []  - 0 Specimen(s) / Culture(s) sent or taken to Lab for analysis []  - 0 Patient Transfer (multiple staff / Nurse, adult / Similar devices) []  - 0 Simple Staple / Suture removal (25 or less) []  - 0 Complex Staple / Suture removal (26 or more) []  - 0 Hypo / Hyperglycemic Management (close monitor of Blood Glucose) []  - 0 Ankle / Brachial Index (ABI) - do not check if billed separately Has the patient been seen at the hospital within the last three years: Yes Total Score: 110 Level Of Care: New/Established - Level 3 Electronic Signature(s) Signed: 03/25/2023 5:27:27 PM By: Midge Aver MSN RN CNS WTA Entered By: Midge Aver on 03/19/2023  16:22:44 -------------------------------------------------------------------------------- Encounter Discharge Information Details Patient Name: Date of Service: [name] Justin [name]. 03/19/2023 2:00 PM Medical Record Number: [id number] Patient Account Number: [id number] Date of Birth/Sex: Treating RN: 1980-12-14 (42 y.o. Justin [name] Primary Care Merrik Puebla: Mila Merry Other Clinician: Referring Arael Piccione: Treating Sri Clegg/Extender: Reinaldo Raddle in Treatment: 0 Encounter Discharge Information Items Discharge Condition: Stable Ambulatory Status: Wheelchair Justin [name], Justin [name] ([id number]) [id number] Page 4 of 10 Discharge Destination: Home Transportation: Private Auto Accompanied By: wife Schedule Follow-up Appointment: Yes Clinical Summary of Care: Electronic Signature(s) Signed: 03/19/2023 4:25:58 PM By: Midge Aver MSN RN CNS WTA Entered By: Midge Aver on 03/19/2023 16:25:58 -------------------------------------------------------------------------------- Lower Extremity Assessment Details Patient Name: Date of Service: [name] [facility] [name]. 03/19/2023 2:00 PM Medical Record Number: [id number] Patient Account Number: [id number] Date of Birth/Sex: Treating RN: Aug 08, 1981 (42 y.o. Justin [name] Primary Care Adaly Puder: Mila Merry Other Clinician: Referring Jurline Folger: Treating Orian Figueira/Extender: Reinaldo Raddle in Treatment: 0 Electronic Signature(s) Signed: 03/19/2023 4:22:06 PM By: Midge Aver MSN RN CNS WTA Entered By: Midge Aver on 03/19/2023 16:22:06 -------------------------------------------------------------------------------- Multi Wound Chart Details Patient Name: Date of Service: Mariel Sleet [facility] [name]. 03/19/2023 2:00 PM Medical Record Number: [id number] Patient Account Number: [id number] Date of Birth/Sex: Treating RN: 05-26-1981 (42 y.o. Justin [name] Primary Care Aja Whitehair: Mila Merry Other  Clinician: Referring Tyannah Sane: Treating Huberta Tompkins/Extender: Reinaldo Raddle in Treatment: 0 Vital Signs Height(in): 76 Pulse(bpm): 105 Weight(lbs): 200 Blood Pressure(mmHg): 124/76 Body Mass Index(BMI): 24.3 Temperature(F): 97.8 Respiratory Rate(breaths/min): 18 [5:Photos:] [[name]/A:[name]/A] Right Gluteus [name]/A [name]/A Wound Location: Pressure Injury [name]/A [name]/A Wounding Event: Pressure Ulcer [name]/A [name]/A Primary Etiology: Asthma, Pneumothorax, Arrhythmia, [name]/A [name]/A Comorbid History: History of pressure wounds, Osteoarthritis, Paraplegia 02/13/2023 [name]/A [name]/A Date Acquired: 0 [name]/A [name]/A Weeks of Treatment: Open [name]/A [name]/A Wound Status: No [name]/A [name]/A Wound Recurrence: 2.3x1x0.1 [name]/A [name]/A  Measurements L x W x D (cm) 1.806 [name]/A [name]/A A (cm) : rea 0.181 [name]/A [name]/A Volume (cm) : Category/Stage III [name]/A [name]/A Classification: Medium [name]/A [name]/A Exudate A mount: Serosanguineous [name]/A [name]/A Exudate Type: red, brown [name]/A [name]/A Exudate Color: Medium (34-66%) [name]/A [name]/A Granulation A mount: Red, Pink [name]/A [name]/A Granulation Quality: Small (1-33%) [name]/A [name]/A Necrotic A mount: Fat Layer (Subcutaneous Tissue): Yes [name]/A [name]/A Exposed Structures: Fascia: No Tendon: No Muscle: No Joint: No Bone: No Small (1-33%) [name]/A [name]/A Epithelialization: Treatment Notes Wound #5 (Gluteus) Wound Laterality: Right Cleanser Soap and Water Discharge Instruction: Gently cleanse wound with antibacterial soap, rinse and pat dry prior to dressing wounds Vashe 5.8 (oz) Discharge Instruction: Use vashe 5.8 (oz) as directed Peri-Wound Care Topical Primary Dressing Promogran Matrix 4.34 (in) Discharge Instruction: Moisten w/normal saline or sterile water; Cover wound as directed. Do not remove from wound bed. Secondary Dressing T Adhesive Island Dressing, 4x4 (in/in) elfa Discharge Instruction: Apply over dressing to secure in place. Secured With Compression Wrap Compression Stockings Facilities manager) Signed: 03/19/2023  4:25:28 PM By: Midge Aver MSN RN CNS WTA Previous Signature: 03/19/2023 4:22:21 PM Version By: Midge Aver MSN RN CNS WTA Entered By: Midge Aver on 03/19/2023 16:25:27 Justin [name], Justin [name] ([id number]) [id number] Page 6 of 10 -------------------------------------------------------------------------------- Multi-Disciplinary Care Plan Details Patient Name: Date of Service: [name] [facility] [name]. 03/19/2023 2:00 PM Medical Record Number: [id number] Patient Account Number: [id number] Date of Birth/Sex: Treating RN: 02-07-1981 (42 y.o. Justin [name] Primary Care Kalesha Irving: Mila Merry Other Clinician: Referring Jillene Wehrenberg: Treating Samier Jaco/Extender: Reinaldo Raddle in Treatment: 0 Active Inactive Pressure Nursing Diagnoses: Knowledge deficit related to causes and risk factors for pressure ulcer development Knowledge deficit related to management of pressures ulcers Potential for impaired tissue integrity related to pressure, friction, moisture, and shear Goals: Patient will remain free from development of additional pressure ulcers Date Initiated: 03/19/2023 Target Resolution Date: 04/19/2023 Goal Status: Active Patient will remain free of pressure ulcers Date Initiated: 03/19/2023 Target Resolution Date: 04/19/2023 Goal Status: Active Patient/caregiver will verbalize risk factors for pressure ulcer development Date Initiated: 03/19/2023 Target Resolution Date: 04/19/2023 Goal Status: Active Patient/caregiver will verbalize understanding of pressure ulcer management Date Initiated: 03/19/2023 Target Resolution Date: 04/19/2023 Goal Status: Active Interventions: Assess: immobility, friction, shearing, incontinence upon admission and as needed Assess offloading mechanisms upon admission and as needed Assess potential for pressure ulcer upon admission and as needed Provide education on pressure ulcers Notes: Wound/Skin Impairment Nursing  Diagnoses: Impaired tissue integrity Knowledge deficit related to ulceration/compromised skin integrity Goals: Patient/caregiver will verbalize understanding of skin care regimen Date Initiated: 03/19/2023 Target Resolution Date: 04/19/2023 Goal Status: Active Ulcer/skin breakdown will have a volume reduction of 30% by week 4 Date Initiated: 03/19/2023 Target Resolution Date: 04/19/2023 Goal Status: Active Ulcer/skin breakdown will have a volume reduction of 50% by week 8 Date Initiated: 03/19/2023 Target Resolution Date: 05/20/2023 Goal Status: Active Ulcer/skin breakdown will have a volume reduction of 80% by week 12 Date Initiated: 03/19/2023 Target Resolution Date: 06/19/2023 Goal Status: Active Interventions: Assess patient/caregiver ability to obtain necessary supplies Justin [name], Justin [name] ([id number]) [id number] Page 7 of 10 Assess patient/caregiver ability to perform ulcer/skin care regimen upon admission and as needed Assess ulceration(s) every visit Provide education on ulcer and skin care Treatment Activities: Skin care regimen initiated : 03/19/2023 Notes: Electronic Signature(s) Signed: 03/19/2023 4:24:35 PM By: Midge Aver MSN RN CNS WTA Previous Signature: 03/19/2023 4:24:28 PM Version By: Midge Aver MSN RN CNS WTA  Entered By: Midge Aver on 03/19/2023 16:24:34 -------------------------------------------------------------------------------- Pain Assessment Details Patient Name: Date of Service: [name] [facility] [name]. 03/19/2023 2:00 PM Medical Record Number: [id number] Patient Account Number: [id number] Date of Birth/Sex: Treating RN: 04-May-1981 (42 y.o. Justin [name] Primary Care Jozette Castrellon: Mila Merry Other Clinician: Referring Aara Jacquot: Treating Jerzie Bieri/Extender: Reinaldo Raddle in Treatment: 0 Active Problems Location of Pain Severity and Description of Pain Patient Has Paino Yes Site Locations Rate the pain. Current Pain  Level: 4 Pain Management and Medication Current Pain Management: Electronic Signature(s) Signed: 03/19/2023 4:21:55 PM By: Midge Aver MSN RN CNS WTA Entered By: Midge Aver on 03/19/2023 16:21:55 Justin [name] ([id number]) [id number] Page 8 of 10 -------------------------------------------------------------------------------- Patient/Caregiver Education Details Patient Name: Date of Service: [name] [facility] 7/2/2024andnbsp2:00 PM Medical Record Number: [id number] Patient Account Number: [id number] Date of Birth/Gender: Treating RN: [birth year] (42 y.o. Justin [name] Primary Care Physician: Mila Merry Other Clinician: Referring Physician: Treating Physician/Extender: Reinaldo Raddle in Treatment: 0 Education Assessment Education Provided To: Patient Education Topics Provided Pressure: Handouts: Pressure Injury: Prevention and Offloading Methods: Explain/Verbal Responses: State content correctly Wound/Skin Impairment: Handouts: Caring for Your Ulcer Methods: Explain/Verbal Responses: State content correctly Electronic Signature(s) Signed: 03/25/2023 5:27:27 PM By: Midge Aver MSN RN CNS WTA Entered By: Midge Aver on 03/19/2023 16:24:57 -------------------------------------------------------------------------------- Wound Assessment Details Patient Name: Date of Service: [name] Justin [name]. 03/19/2023 2:00 PM Medical Record Number: [id number] Patient Account Number: [id number] Date of Birth/Sex: Treating RN: 1981/07/20 (42 y.o. Justin [name] Primary Care Justin [name]: Mila Merry Other Clinician: Referring Noriah Osgood: Treating Ronn Smolinsky/Extender: Reinaldo Raddle in Treatment: 0 Wound Status Wound Number: 5 Primary Pressure Ulcer Etiology: Wound Location: Right Gluteus Wound Open Wounding Event: Pressure Injury Status: Date Acquired: 02/13/2023 Comorbid Asthma, Pneumothorax, Arrhythmia, History  of pressure Weeks Of Treatment: 0 History: wounds, Osteoarthritis, Paraplegia Clustered Wound: No Justin [name], Justin [name] ([id number]) [id number] Page 9 of 10 Photos Wound Measurements Length: (cm) 2.3 Width: (cm) 1 Depth: (cm) 0.1 Area: (cm) 1.806 Volume: (cm) 0.181 % Reduction in Area: % Reduction in Volume: Epithelialization: Small (1-33%) Tunneling: No Undermining: No Wound Description Classification: Category/Stage III Exudate Amount: Medium Exudate Type: Serosanguineous Exudate Color: red, brown Foul Odor After Cleansing: No Slough/Fibrino No Wound Bed Granulation Amount: Medium (34-66%) Exposed Structure Granulation Quality: Red, Pink Fascia Exposed: No Necrotic Amount: Small (1-33%) Fat Layer (Subcutaneous Tissue) Exposed: Yes Necrotic Quality: Adherent Slough Tendon Exposed: No Muscle Exposed: No Joint Exposed: No Bone Exposed: No Electronic Signature(s) Signed: 03/25/2023 5:27:27 PM By: Midge Aver MSN RN CNS WTA Previous Signature: 03/19/2023 3:07:16 PM Version By: Allen Derry PA-C Entered By: Midge Aver on 03/19/2023 15:13:51 -------------------------------------------------------------------------------- Vitals Details Patient Name: Date of Service: [name] Justin [name]. 03/19/2023 2:00 PM Medical Record Number: [id number] Patient Account Number: [id number] Date of Birth/Sex: Treating RN: 04-27-1981 (42 y.o. Justin [name] Primary Care Karlynn Furrow: Mila Merry Other Clinician: Referring Suraiya Dickerson: Treating Shelie Lansing/Extender: Reinaldo Raddle in Treatment: 0 Vital Signs Time Taken: 14:30 Temperature (F): 97.8 Height (in): 76 Pulse (bpm): 105 Source: Stated Respiratory Rate (breaths/min): 18 Weight (lbs): 200 Blood Pressure (mmHg): 124/76 Source: Stated Reference Range: 80 - 120 mg / dl Body Mass Index (BMI): 24.3 Justin [name], Justin [name] ([id number]) [id number] Page 10 of 10 Electronic  Signature(s) Signed: 03/19/2023 4:21:59 PM By: Midge Aver MSN RN CNS WTA Entered By: Midge Aver on 03/19/2023 16:21:59

## 2023-03-25 NOTE — Telephone Encounter (Signed)
Copied from CRM [id number]. Topic: General - Inquiry >> Mar 22, 2023  3:35 PM Clide Dales wrote: Patient would like to speak with nurse about getting all of his medications on the same fill date. Patient said that his Oxycodone got off sync with the rest of the other medications. Please advise.

## 2023-03-25 NOTE — Progress Notes (Signed)
Justin [name], Justin [name] ([id number]) [id number] Page 1 of 5 Visit Report for 03/19/2023 Abuse Risk Screen Details Patient Name: Date of Service: Justin [name] [facility] N. 03/19/2023 2:00 PM Medical Record Number: [id number] Patient Account Number: [id number] Date of Birth/Sex: Treating RN: 02-10-1981 (42 y.o. [name] Primary Care Joselyn Edling: Mila Merry Other Clinician: Referring Barack Nicodemus: Treating Deem Marmol/Extender: Reinaldo Raddle in Treatment: 0 Abuse Risk Screen Items Answer ABUSE RISK SCREEN: Has anyone close to you tried to hurt or harm you recentlyo No Do you feel uncomfortable with anyone in your familyo No Has anyone forced you do things that you didnt want to doo No Electronic Signature(s) Signed: 03/25/2023 5:27:27 PM By: Midge Aver MSN RN CNS WTA Entered By: Midge Aver on 03/19/2023 14:39:01 -------------------------------------------------------------------------------- Activities of Daily Living Details Patient Name: Date of Service: Justin [name] [facility] N. 03/19/2023 2:00 PM Medical Record Number: [id number] Patient Account Number: [id number] Date of Birth/Sex: Treating RN: 06-27-1981 (42 y.o. [name] Primary Care Uzziah Rigg: Mila Merry Other Clinician: Referring Montgomery Rothlisberger: Treating Blong Busk/Extender: Reinaldo Raddle in Treatment: 0 Activities of Daily Living Items Answer Activities of Daily Living (Please select one for each item) Drive Automobile Not Able T Medications ake Need Assistance Use T elephone Completely Able Care for Appearance Need Assistance Use T oilet Need Assistance Bath / Shower Need Assistance Dress Self Need Assistance Feed Self Need Assistance Walk Not Able Get In / Out Bed Need Assistance Housework Need Assistance Justin [name], Justin [name] [place] ([id number]) [phone] [email or web address] Page 2 of 5 Prepare Meals Need Assistance Handle Money Completely  Able Shop for Self Need Assistance Electronic Signature(s) Signed: 03/25/2023 5:27:27 PM By: Midge Aver MSN RN CNS WTA Entered By: Midge Aver on 03/19/2023 14:39:28 -------------------------------------------------------------------------------- Education Screening Details Patient Name: Date of Service: Justin [name] [name] 03/19/2023 2:00 PM Medical Record Number: [id number] Patient Account Number: [id number] Date of Birth/Sex: Treating RN: 1981-04-24 (42 y.o. [name] Primary Care Kenyia Wambolt: Mila Merry Other Clinician: Referring Mahari Strahm: Treating Alianys Chacko/Extender: Reinaldo Raddle in Treatment: 0 Primary Learner Assessed: Patient Learning Preferences/Education Level/Primary Language Learning Preference: Explanation, Demonstration, Video, Communication Board, Printed Material Preferred Language: English Cognitive Barrier Language Barrier: No Translator Needed: No Memory Deficit: No Emotional Barrier: No Cultural/Religious Beliefs Affecting Medical Care: No Physical Barrier Impaired Vision: No Impaired Hearing: No Decreased Hand dexterity: No Knowledge/Comprehension Knowledge Level: Medium Comprehension Level: Medium Ability to understand written instructions: Medium Ability to understand verbal instructions: Medium Motivation Anxiety Level: Calm Cooperation: Cooperative Education Importance: Acknowledges Need Interest in Health Problems: Asks Questions Perception: Coherent Willingness to Engage in Self-Management High Activities: Readiness to Engage in Self-Management High Activities: Electronic Signature(s) Signed: 03/25/2023 5:27:27 PM By: Midge Aver MSN RN CNS WTA Entered By: Midge Aver on 03/19/2023 14:39:48 Justin [name], Justin [name] ([id number]) [phone] [id number] Page 3 of 5 -------------------------------------------------------------------------------- Fall Risk Assessment Details Patient Name: Date of  Service: Justin [name] [facility] N. 03/19/2023 2:00 PM Medical Record Number: [id number] Patient Account Number: [id number] Date of Birth/Sex: Treating RN: 1981-09-17 (42 y.o. [name] Primary Care Llesenia Fogal: Mila Merry Other Clinician: Referring Athene Schuhmacher: Treating Glendell Schlottman/Extender: Reinaldo Raddle in Treatment: 0 Fall Risk Assessment Items Have you had 2 or more falls in the last 12 monthso 0 No Have you had any fall that resulted in injury in the last 12 monthso 0 No FALLS RISK SCREEN History of falling - immediate or within 3 months 0 No Secondary diagnosis (Do  you have 2 or more medical diagnoseso) 0 No Ambulatory aid None/bed rest/wheelchair/nurse 0 Yes Crutches/cane/walker 0 No Furniture 0 No Intravenous therapy Access/Saline/Heparin Lock 0 No Gait/Transferring Normal/ bed rest/ wheelchair 0 Yes Weak (short steps with or without shuffle, stooped but able to lift head while walking, may seek 0 No support from furniture) Impaired (short steps with shuffle, may have difficulty arising from chair, head down, impaired 0 No balance) Mental Status Oriented to own ability 0 Yes Electronic Signature(s) Signed: 03/25/2023 5:27:27 PM By: Midge Aver MSN RN CNS WTA Entered By: Midge Aver on 03/19/2023 14:39:58 -------------------------------------------------------------------------------- Foot Assessment Details Patient Name: Date of Service: Justin [name] [name] 03/19/2023 2:00 PM Medical Record Number: [id number] Patient Account Number: [id number] Date of Birth/Sex: Treating RN: 02-11-1981 (42 y.o. [name] Primary Care Vinaya Sancho: Mila Merry Other Clinician: Referring Trayon Krantz: Treating Elfreida Heggs/Extender: Reinaldo Raddle in Treatment: 0 Foot Assessment Items Site Locations [place] [place] ([id number]) [phone] [id number] Page 4 of 5 + = Sensation present, - = Sensation absent, C = Callus, U =  Ulcer R = Redness, W = Warmth, M = Maceration, PU = Pre-ulcerative lesion F = Fissure, S = Swelling, D = Dryness Assessment Right: Left: Other Deformity: No No Prior Foot Ulcer: No No Prior Amputation: No No Charcot Joint: No No Ambulatory Status: Non-ambulatory Assistance Device: Wheelchair Gait: Notes pt able to transfer self into bed and back into wheelchair independently Electronic Signature(s) Signed: 03/25/2023 5:27:27 PM By: Midge Aver MSN RN CNS WTA Entered By: Midge Aver on 03/19/2023 14:40:42 -------------------------------------------------------------------------------- Nutrition Risk Screening Details Patient Name: Date of Service: Justin [name] [facility] N. 03/19/2023 2:00 PM Medical Record Number: [id number] Patient Account Number: [id number] Date of Birth/Sex: Treating RN: 08-Jun-1981 (42 y.o. [name] Primary Care Lorraina Spring: Mila Merry Other Clinician: Referring Terrel Nesheiwat: Treating Akera Snowberger/Extender: Reinaldo Raddle in Treatment: 0 Height (in): 76 Weight (lbs): 200 Body Mass Index (BMI): 24.3 Nutrition Risk Screening Items Score Screening NUTRITION RISK SCREEN: I have an illness or condition that made me change the kind and/or amount of food I eat 0 No Justin [name], Justin [name] ([id number]) [phone] [id number] Page 5 of 5 I eat fewer than two meals per day 0 No I eat few fruits and vegetables, or milk products 0 No I have three or more drinks of beer, liquor or wine almost every day 0 No I have tooth or mouth problems that make it hard for me to eat 0 No I don't always have enough money to buy the food I need 0 No I eat alone most of the time 0 No I take three or more different prescribed or over-the-counter drugs a day 0 No Without wanting to, I have lost or gained 10 pounds in the last six months 0 No I am not always physically able to shop, cook and/or feed myself 0 No Nutrition Protocols Good Risk Protocol 0 No  interventions needed Moderate Risk Protocol High Risk Proctocol Risk Level: Good Risk Score: 0 Electronic Signature(s) Signed: 03/25/2023 5:27:27 PM By: Midge Aver MSN RN CNS WTA Entered By: Midge Aver on 03/19/2023 14:40:07
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
## 2023-03-26 ENCOUNTER — Telehealth: Payer: Self-pay | Admitting: Family Medicine

## 2023-03-26 ENCOUNTER — Telehealth: Payer: Self-pay

## 2023-03-26 DIAGNOSIS — N390 Urinary tract infection, site not specified: Secondary | ICD-10-CM

## 2023-03-26 MED ORDER — AMOXICILLIN-POT CLAVULANATE 875-125 MG PO TABS
1.0000 | ORAL_TABLET | Freq: Two times a day (BID) | ORAL | 0 refills | Status: AC
Start: 2023-03-26 — End: 2023-04-05

## 2023-03-26 NOTE — Telephone Encounter (Signed)
Pt is calling in because he would like to get his medication on the same fill date. Pt says his oxyCODONE (ROXICODONE) 15 MG immediate release tablet [[id number]] is due after his other medications, and for convenience he would like a prescription written that has them synced with his other medications so he doesn't have to make multiple trips to the pharmacy.

## 2023-03-26 NOTE — Telephone Encounter (Signed)
That's fine

## 2023-03-26 NOTE — Telephone Encounter (Signed)
Pt is calling in requesting an antibiotic be sent in for him for his potential bladder infection.

## 2023-03-26 NOTE — Addendum Note (Signed)
Addended by: Baird Kay on: 03/26/2023 07:05 PM   Modules accepted: Orders

## 2023-03-26 NOTE — Telephone Encounter (Signed)
Copied from CRM [id number]. Topic: General - Other >> Mar 26, 2023 11:37 AM Epimenio Foot F wrote: Reason for CRM: Pt says his urine is cloudy and he would like to do another urine sample to make sure his previous bladder infection had cleared up. Pt says he can send his wife to pick up a cup as well as drop off the sample. Please advise.

## 2023-03-27 ENCOUNTER — Ambulatory Visit: Payer: Self-pay | Admitting: *Deleted

## 2023-03-27 ENCOUNTER — Telehealth: Payer: Self-pay

## 2023-03-27 ENCOUNTER — Ambulatory Visit: Payer: Medicare HMO | Admitting: Dermatology

## 2023-03-27 NOTE — Telephone Encounter (Signed)
Copied from CRM [id number]. Topic: General - Inquiry >> Mar 27, 2023 10:25 AM De Blanch wrote: Reason for CRM:Pt is requesting a [facility] / Location: [facility] The referral was already sent to [facility].  While on the line, the patient received a call from NT.  See TE from NT 07/10.

## 2023-03-27 NOTE — Telephone Encounter (Unsigned)
Copied from CRM [id number]. Topic: General - Other >> Mar 27, 2023  1:21 PM Carrielelia G wrote: FYI: I have an urology appt tomorrow 7/11 with [facility], I will do an urine specimen there. and Dr Chriss Driver, Molly Maduro of pain management I am still waiting on them to set me up an appt.

## 2023-03-27 NOTE — Telephone Encounter (Signed)
Copied from CRM [id number]. Topic: General - Other >> Mar 27, 2023  9:28 AM Everette C wrote: Reason for CRM: The patient has called to share with the practice that they will see a new urologist in roughly two weeks   The patient will call back to provide additional information when possible after the appt

## 2023-03-27 NOTE — Telephone Encounter (Signed)
  Chief Complaint: Cloudy urine, bladder spasms. Also having an issue with getting into a pain clinic.    (See notes for details) Symptoms: Legs are jerking as this is how his body responds to pain due to paralysis.  Frequency: Last few days Pertinent Negatives: Patient denies N/A Disposition: [] ED /[] Urgent Care (no appt availability in office) / [] Appointment(In office/virtual)/ []  [facility]/ [] Home Care/ [] Refused Recommended Disposition /[] [place]/ [x]  Follow-up with PCP Additional Notes: His wife is going to bring in a urine sample when she gets off of work today at 3:00.  See notes about getting into a pain clinic and the issue he is having.

## 2023-03-27 NOTE — Telephone Encounter (Signed)
Message from [place] sent at 03/27/2023  9:28 AM EDT  Summary: muscular discomfort / concern   The patient would like to have something prescribed to help with their spasms and lower bodily discomfort  The patient is concerned that the spasms may be related to ongoing bladder concerns  The patient shares that they have recently been prescribed a new muscle relaxer and would like to discuss concerns related to the prescription as well  Please contact further when possible          Call History   Type Contact Phone/Fax User  03/27/2023 09:18 AM EDT Phone (Incoming) Romeo Apple, Earnie Larsson "[place]" (Self) [phone] Judie Petit) Coley, Everette A   Reason for Disposition  Urinating more frequently than usual (i.e., frequency)    Cloudy urine and bladder spasms  Answer Assessment - Initial Assessment Questions 1. SYMPTOM: "What's the main symptom you're concerned about?" (e.g., frequency, incontinence)     I just spoke with a someone in [facility].    She was transferring me to a nurse when you called in.    I think I have a bladder infection.   My urine is cloudy and my legs are jumping all over the place which is how my body reacts when something is wrong.    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      Normally I bring in a urine sample.    My wife is going to bring a urine sample over this afternoon after she gets off work.    I'm concerned about the pain clinic.    When I get these bladder infections the pain medicines don't help.    I had a referral to a pain clinic and they are referring each other to each other.     I don't know what to do. [facility] told me my case was too complicated for them to take on.      [facility] clinic wants him to be seen at [facility] but [facility] can't handle my case due to the seriousness of my condition.   I'm caught up in a circle with the pain clinics and don't know what to  do.   2. ONSET: "When did the  cloudy urine  start?"     A few days ago 3. PAIN: "Is there any pain?" If Yes, ask: "How bad is it?" (Scale: 1-10; mild, moderate, severe)     My legs are jerking because of the bladder spasms which is how my body responds to pain. 4. CAUSE: "What do you think is causing the symptoms?"     UTI 5. OTHER SYMPTOMS: "Do you have any other symptoms?" (e.g., blood in urine, fever, flank pain, pain with urination)     Not asked 6. PREGNANCY: "Is there any chance you are pregnant?" "When was your last menstrual period?"     N/A  Protocols used: Urinary Symptoms-A-AH

## 2023-03-28 DIAGNOSIS — N319 Neuromuscular dysfunction of bladder, unspecified: Secondary | ICD-10-CM | POA: Diagnosis not present

## 2023-03-28 DIAGNOSIS — B372 Candidiasis of skin and nail: Secondary | ICD-10-CM | POA: Diagnosis not present

## 2023-03-28 DIAGNOSIS — N390 Urinary tract infection, site not specified: Secondary | ICD-10-CM | POA: Diagnosis not present

## 2023-04-02 ENCOUNTER — Other Ambulatory Visit: Payer: Self-pay | Admitting: Family Medicine

## 2023-04-02 DIAGNOSIS — G894 Chronic pain syndrome: Secondary | ICD-10-CM

## 2023-04-08 ENCOUNTER — Encounter: Payer: Self-pay | Admitting: Family Medicine

## 2023-04-08 ENCOUNTER — Encounter: Payer: Medicare HMO | Admitting: Physical Medicine and Rehabilitation

## 2023-04-09 ENCOUNTER — Other Ambulatory Visit (INDEPENDENT_AMBULATORY_CARE_PROVIDER_SITE_OTHER): Payer: Medicare HMO

## 2023-04-09 DIAGNOSIS — N390 Urinary tract infection, site not specified: Secondary | ICD-10-CM

## 2023-04-09 LAB — POCT URINALYSIS DIPSTICK
Bilirubin, UA: NEGATIVE
Blood, UA: NEGATIVE
Glucose, UA: NEGATIVE
Ketones, UA: NEGATIVE
Leukocytes, UA: NEGATIVE
Nitrite, UA: NEGATIVE
Protein, UA: NEGATIVE
Spec Grav, UA: 1.02 (ref 1.010–1.025)
Urobilinogen, UA: 0.2 E.U./dL
pH, UA: 6 (ref 5.0–8.0)

## 2023-04-11 ENCOUNTER — Encounter: Payer: Medicare HMO | Admitting: Internal Medicine

## 2023-04-11 DIAGNOSIS — Z8249 Family history of ischemic heart disease and other diseases of the circulatory system: Secondary | ICD-10-CM | POA: Diagnosis not present

## 2023-04-11 DIAGNOSIS — L89313 Pressure ulcer of right buttock, stage 3: Secondary | ICD-10-CM | POA: Diagnosis not present

## 2023-04-11 DIAGNOSIS — Z87440 Personal history of urinary (tract) infections: Secondary | ICD-10-CM | POA: Diagnosis not present

## 2023-04-11 DIAGNOSIS — L89153 Pressure ulcer of sacral region, stage 3: Secondary | ICD-10-CM | POA: Diagnosis not present

## 2023-04-11 DIAGNOSIS — G8222 Paraplegia, incomplete: Secondary | ICD-10-CM | POA: Diagnosis not present

## 2023-04-11 NOTE — Progress Notes (Addendum)
[name] [name], [name] [name] ([id number]) [id number] Page 1 of 8 Visit Report for 04/11/2023 Arrival Information Details Patient Name: Date of Service: HA [name] [name] [name]. 04/11/2023 3:30 PM Medical Record Number: [id number] Patient Account Number: [id number] Date of Birth/Sex: Treating RN: [birth year] (42 y.o. [name] [name] Primary Care Fields Oros: [name] [name] Other Clinician: Referring [name] [name]: Treating [name] [name]/Extender: [name] [name], [name] [name] Justin [name], Referral Weeks in Treatment: 3 Visit Information History Since Last Visit Added or deleted any medications: No Patient Arrived: Wheel Chair Any new allergies or adverse reactions: No Arrival Time: 15:51 Has Dressing in Place as Prescribed: Yes Accompanied By: wife Has Compression in Place as Prescribed: Yes Transfer Assistance: None Pain Present Now: No Patient Identification Verified: Yes Secondary Verification Process Completed: Yes Patient Requires Transmission-Based Precautions: No Patient Has Alerts: No Electronic Signature(s) Signed: 04/11/2023 4:36:40 PM By: Justin Aver MSN RN CNS WTA Entered By: [name] [name] on 04/11/2023 16:36:40 -------------------------------------------------------------------------------- Clinic Level of Care Assessment Details Patient Name: Date of Service: HA [name] [name] [facility] [name]. 04/11/2023 3:30 PM Medical Record Number: [id number] Patient Account Number: [id number] Date of Birth/Sex: Treating RN: May 28, 1981 (42 y.o. [name] [name] Primary Care [name] [name]: [name] [name] Other Clinician: Referring [name] [name]: Treating [name] [name]/Extender: [name] [name], [name] [name] Justin [name], Referral Weeks in Treatment: 3 Clinic Level of Care Assessment Items TOOL 1 Quantity Score []  - 0 Use when EandM and Procedure is performed on INITIAL visit ASSESSMENTS - Nursing Assessment / Reassessment []  - 0 General Physical Exam (combine w/ comprehensive assessment (listed just below) when performed on new pt.  evals) []  - 0 Comprehensive Assessment (HX, ROS, Risk Assessments, Wounds Hx, etc.) ASSESSMENTS - Wound and Skin Assessment / Reassessment []  - 0 Dermatologic / Skin Assessment (not related to wound area) ASSESSMENTS - Ostomy and/or Continence Assessment and Care ALMUS, WOODHAM ([id number]) [id number] Page 2 of 8 []  - 0 Incontinence Assessment and Management []  - 0 Ostomy Care Assessment and Management (repouching, etc.) PROCESS - Coordination of Care []  - 0 Simple Patient / Family Education for ongoing care []  - 0 Complex (extensive) Patient / Family Education for ongoing care []  - 0 Staff obtains Chiropractor, Records, T Results / Process Orders est []  - 0 Staff telephones HHA, Nursing Homes / Clarify orders / etc []  - 0 Routine Transfer to another Facility (non-emergent condition) []  - 0 Routine Hospital Admission (non-emergent condition) []  - 0 New Admissions / Manufacturing engineer / Ordering NPWT Apligraf, etc. , []  - 0 Emergency Hospital Admission (emergent condition) PROCESS - Special Needs []  - 0 Pediatric / Minor Patient Management []  - 0 Isolation Patient Management []  - 0 Hearing / Language / Visual special needs []  - 0 Assessment of Community assistance (transportation, D/C planning, etc.) []  - 0 Additional assistance / Altered mentation []  - 0 Support Surface(s) Assessment (bed, cushion, seat, etc.) INTERVENTIONS - Miscellaneous []  - 0 External ear exam []  - 0 Patient Transfer (multiple staff / Nurse, adult / Similar devices) []  - 0 Simple Staple / Suture removal (25 or less) []  - 0 Complex Staple / Suture removal (26 or more) []  - 0 Hypo/Hyperglycemic Management (do not check if billed separately) []  - 0 Ankle / Brachial Index (ABI) - do not check if billed separately Has the patient been seen at the hospital within the last three years: Yes Total Score: 0 Level Of Care: ____ Electronic Signature(s) Signed:  04/11/2023 4:43:06 PM By: Justin Aver MSN RN CNS WTA Entered By: [name] [name] on 04/11/2023  16:37:21 -------------------------------------------------------------------------------- Encounter Discharge Information Details Patient Name: Date of Service: HA [name] [name] [facility] [name]. 04/11/2023 3:30 PM Medical Record Number: [id number] Patient Account Number: [id number] Date of Birth/Sex: Treating RN: 1980-10-06 (42 y.o. [name] [name] Primary Care [name] [name]: [name] [name] Other Clinician: Referring [name] [name]: Treating [name] [name]/Extender: [name] [name], [name] [name] Justin [name], Referral Weeks in Treatment: 3 Encounter Discharge Information Items Post Procedure Vitals Discharge Condition: Stable Temperature (F): 98.5 [name], [name] [name] ([id number]) [id number] Page 3 of 8 Ambulatory Status: Wheelchair Pulse (bpm): 97 Discharge Destination: Home Respiratory Rate (breaths/min): 18 Transportation: Private Auto Blood Pressure (mmHg): 119/78 Accompanied By: wife Schedule Follow-up Appointment: Yes Clinical Summary of Care: Electronic Signature(s) Signed: 04/11/2023 4:38:13 PM By: Justin Aver MSN RN CNS WTA Entered By: [name] [name] on 04/11/2023 16:38:13 -------------------------------------------------------------------------------- Lower Extremity Assessment Details Patient Name: Date of Service: HA [name] [name] [facility] [name]. 04/11/2023 3:30 PM Medical Record Number: [id number] Patient Account Number: [id number] Date of Birth/Sex: Treating RN: Feb 09, 1981 (42 y.o. [name] [name] Primary Care [name] [name]: [name] [name] Other Clinician: Referring [name] [name]: Treating [name] [name]/Extender: Justin BSO [name] [name], [name] [name] Justin [name], Referral Weeks in Treatment: 3 Electronic Signature(s) Signed: 04/11/2023 4:36:55 PM By: Justin Aver MSN RN CNS WTA Entered By: [name] [name] on 04/11/2023 16:36:55 -------------------------------------------------------------------------------- Multi Wound Chart Details Patient Name:  Date of Service: [name] [name] [facility] [name]. 04/11/2023 3:30 PM Medical Record Number: [id number] Patient Account Number: [id number] Date of Birth/Sex: Treating RN: Oct 17, 1980 (42 y.o. [name] [name] Primary Care Shawna Wearing: [name] [name] Other Clinician: Referring Sammye Staff: Treating Triana Coover/Extender: [name] [name], [name] [name] Justin [name], Referral Weeks in Treatment: 3 Vital Signs Height(in): 76 Pulse(bpm): 97 Weight(lbs): 200 Blood Pressure(mmHg): 119/78 Body Mass Index(BMI): 24.3 Temperature(F): 98.5 Respiratory Rate(breaths/min): 18 [5:Photos:] [[name]/A:[name]/A] Right Gluteus [name]/A [name]/A Wound Location: Pressure Injury [name]/A [name]/A Wounding Event: Pressure Ulcer [name]/A [name]/A Primary Etiology: Asthma, Pneumothorax, Arrhythmia, [name]/A [name]/A Comorbid History: History of pressure wounds, Osteoarthritis, Paraplegia 02/13/2023 [name]/A [name]/A Date Acquired: 3 [name]/A [name]/A Weeks of Treatment: Open [name]/A [name]/A Wound Status: No [name]/A [name]/A Wound Recurrence: 2x0.5x0.1 [name]/A [name]/A Measurements L x W x D (cm) 0.785 [name]/A [name]/A A (cm) : rea 0.079 [name]/A [name]/A Volume (cm) : 56.50% [name]/A [name]/A % Reduction in A rea: 56.40% [name]/A [name]/A % Reduction in Volume: Category/Stage III [name]/A [name]/A Classification: Medium [name]/A [name]/A Exudate A mount: Serosanguineous [name]/A [name]/A Exudate Type: red, brown [name]/A [name]/A Exudate Color: Medium (34-66%) [name]/A [name]/A Granulation A mount: Red, Pink [name]/A [name]/A Granulation Quality: None Present (0%) [name]/A [name]/A Necrotic A mount: Fat Layer (Subcutaneous Tissue): Yes [name]/A [name]/A Exposed Structures: Fascia: No Tendon: No Muscle: No Joint: No Bone: No Small (1-33%) [name]/A [name]/A Epithelialization: Debridement - Selective/Open Wound [name]/A [name]/A Debridement: Pre-procedure Verification/Time Out 16:10 [name]/A [name]/A Taken: Callus [name]/A [name]/A Tissue Debrided: Non-Viable Tissue [name]/A [name]/A Level: 0.78 [name]/A [name]/A Debridement A (sq cm): rea Curette [name]/A [name]/A Instrument: None [name]/A [name]/A Bleeding: 0 [name]/A [name]/A Procedural Pain: 0 [name]/A [name]/A Post Procedural Pain: Procedure was tolerated  well [name]/A [name]/A Debridement Treatment Response: 2x0.5x0.1 [name]/A [name]/A Post Debridement Measurements L x W x D (cm) 0.079 [name]/A [name]/A Post Debridement Volume: (cm) Category/Stage III [name]/A [name]/A Post Debridement Stage: Debridement [name]/A [name]/A Procedures Performed: Treatment Notes Wound #5 (Gluteus) Wound Laterality: Right Cleanser Soap and Water Discharge Instruction: Gently cleanse wound with antibacterial soap, rinse and pat dry prior to dressing wounds Vashe 5.8 (oz) Discharge Instruction: Use vashe 5.8 (oz) as directed Peri-Wound Care Topical Primary Dressing Promogran Matrix 4.34 (in) Discharge Instruction: Moisten  w/normal saline or sterile water; Cover wound as directed. Do not remove from wound bed. Secondary Dressing T Adhesive Island Dressing, 4x4 (in/in) elfa Discharge Instruction: Apply over dressing to secure in place. Secured With [employer] Compression Stockings Add-Ons [name] [name], [name] [name] ([id number]) [id number] Page 5 of 8 Electronic Signature(s) Signed: 04/11/2023 4:37:01 PM By: Justin Aver MSN RN CNS WTA Entered By: [name] [name] on 04/11/2023 16:37:00 -------------------------------------------------------------------------------- Multi-Disciplinary Care Plan Details Patient Name: Date of Service: HA [name] [name] [facility] [name]. 04/11/2023 3:30 PM Medical Record Number: [id number] Patient Account Number: [id number] Date of Birth/Sex: Treating RN: October 01, 1980 (42 y.o. [name] [name] Primary Care Evelyna Folker: [name] [name] Other Clinician: Referring Ryan Palermo: Treating Lyndi Holbein/Extender: Justin BSO [name] [name], [name] [name] Justin [name], Referral Weeks in Treatment: 3 Active Inactive Electronic Signature(s) Signed: 05/30/2023 6:03:13 PM By: Elliot Gurney, BSN, RN, CWS, Kim RN, BSN Signed: 06/17/2023 4:58:41 PM By: Justin Aver MSN RN CNS WTA Previous Signature: 04/11/2023 4:37:40 PM Version By: Justin Aver MSN RN CNS WTA Entered By: Elliot Gurney, BSN, RN, CWS, Kim on 05/30/2023  18:03:13 -------------------------------------------------------------------------------- Pain Assessment Details Patient Name: Date of Service: [name] [name] [facility] [name]. 04/11/2023 3:30 PM Medical Record Number: [id number] Patient Account Number: [id number] Date of Birth/Sex: Treating RN: 09-14-1981 (42 y.o. [name] [name] Primary Care Elektra Wartman: [name] [name] Other Clinician: Referring Denice Cardon: Treating Cage Gupton/Extender: [name] [name], [name] [name] Justin [name], Referral Weeks in Treatment: 3 Active Problems Location of Pain Severity and Description of Pain Patient Has Paino No [address] [name] [name], [name] [name] ([id number]) [id number] Page 6 of 8 Pain Management and Medication Current Pain Management: Electronic Signature(s) Signed: 04/11/2023 4:36:49 PM By: Justin Aver MSN RN CNS WTA Entered By: [name] [name] on 04/11/2023 16:36:49 -------------------------------------------------------------------------------- Patient/Caregiver Education Details Patient Name: Date of Service: HA [name] [name] 7/25/2024andnbsp3:30 PM Medical Record Number: [id number] Patient Account Number: [id number] Date of Birth/Gender: Treating RN: [birth year] (42 y.o. [name] [name] Primary Care Physician: [name] [name] Other Clinician: Referring Physician: Treating Physician/Extender: [name] [name], [name] [name] Justin [name], Referral Weeks in Treatment: 3 Education Assessment Education Provided To: Patient Education Topics Provided Wound Debridement: Handouts: Wound Debridement Methods: Explain/Verbal Responses: State content correctly Wound/Skin Impairment: Handouts: Caring for Your Ulcer Methods: Explain/Verbal Responses: State content correctly Electronic Signature(s) Signed: 04/11/2023 4:43:06 PM By: Justin Aver MSN RN CNS WTA Entered By: [name] [name] on 04/11/2023 16:37:44 [name] [name] ([id number]) [id number] Page 7 of  8 -------------------------------------------------------------------------------- Wound Assessment Details Patient Name: Date of Service: HA [name] [name] [facility] [name]. 04/11/2023 3:30 PM Medical Record Number: [id number] Patient Account Number: [id number] Date of Birth/Sex: Treating RN: Oct 11, 1980 (41 y.o. [name] [name] Primary Care Antario Yasuda: [name] [name] Other Clinician: Referring Shavette Shoaff: Treating Keyontae Huckeby/Extender: [name] [name], [name] [name] Justin [name], Referral Weeks in Treatment: 3 Wound Status Wound Number: 5 Primary Pressure Ulcer Etiology: Wound Location: Right Gluteus Wound Open Wounding Event: Pressure Injury Status: Date Acquired: 02/13/2023 Comorbid Asthma, Pneumothorax, Arrhythmia, History of pressure Weeks Of Treatment: 3 History: wounds, Osteoarthritis, Paraplegia Clustered Wound: No Photos Wound Measurements Length: (cm) 2 Width: (cm) 0.5 Depth: (cm) 0.1 Area: (cm) 0.785 Volume: (cm) 0.079 % Reduction in Area: 56.5% % Reduction in Volume: 56.4% Epithelialization: Small (1-33%) Wound Description Classification: Category/Stage III Exudate Amount: Medium Exudate Type: Serosanguineous Exudate Color: red, brown Foul Odor After Cleansing: No Slough/Fibrino No Wound Bed Granulation Amount: Medium (34-66%) Exposed Structure Granulation Quality: Red, Pink Fascia Exposed: No Necrotic Amount: None Present (0%) Fat Layer (Subcutaneous Tissue) Exposed: Yes Tendon Exposed: No Muscle Exposed:  No Joint Exposed: No Bone Exposed: No Electronic Signature(s) Signed: 04/11/2023 4:43:06 PM By: Justin Aver MSN RN CNS WTA Entered By: [name] [name] on 04/11/2023 15:57:39 [name] [name], [name] [name] [name] [name] ([id number]) [id number] Page 8 of 8 -------------------------------------------------------------------------------- Vitals Details Patient Name: Date of Service: HA [name] [name] [facility] [name]. 04/11/2023 3:30 PM Medical Record Number: [id number] Patient Account Number:  [id number] Date of Birth/Sex: Treating RN: 1981-05-21 (42 y.o. [name] [name] Primary Care Nour Scalise: [name] [name] Other Clinician: Referring Shateka Petrea: Treating Shaquina Gillham/Extender: [name] [name], [name] [name] Justin [name], Referral Weeks in Treatment: 3 Vital Signs Time Taken: 15:51 Temperature (F): 98.5 Height (in): 76 Pulse (bpm): 97 Weight (lbs): 200 Respiratory Rate (breaths/min): 18 Body Mass Index (BMI): 24.3 Blood Pressure (mmHg): 119/78 Reference Range: 80 - 120 mg / dl Electronic Signature(s) Signed: 04/11/2023 4:36:44 PM By: Justin Aver MSN RN CNS WTA Entered By: [name] [name] on 04/11/2023 16:36:43

## 2023-04-12 ENCOUNTER — Encounter: Payer: Self-pay | Admitting: Family Medicine

## 2023-04-12 ENCOUNTER — Ambulatory Visit: Payer: Self-pay | Admitting: *Deleted

## 2023-04-12 NOTE — Patient Outreach (Signed)
  Care Coordination   Follow Up Visit Note   04/12/2023 Name: Justin [name] MRN: [id number] DOB: [birth year]  Justin [name] is a 42 y.o. year old male who sees Fisher, Demetrios Isaacs, MD for primary care. I spoke with  [name] by phone today.  What matters to the patients health and wellness today?  Relief of spasms and chronic pain     Goals Addressed             This Visit's Progress    Effective management of muscle spasms   On track    Care Coordination Interventions: Evaluation of current treatment plan related to paraplegia with muscle spasms and patient's adherence to plan as established by provider Advised patient to continue current pain treatment until appt with  new pain clinic Discussed plans with patient for ongoing care management follow up and provided patient with direct contact information for care management team         SDOH assessments and interventions completed:  No     Care Coordination Interventions:  Yes, provided   Interventions Today    Flowsheet Row Most Recent Value  Chronic Disease   Chronic disease during today's visit Other  [chronic pain and spasms. Has rash on sacrum and pressure ulcer, wound care visit yesterday]  General Interventions   General Interventions Discussed/Reviewed Doctor Visits, General Interventions Reviewed  Doctor Visits Discussed/Reviewed Doctor Visits Reviewed, PCP, Specialist  [Dermatology 8/6, urology 8/13, pain clinic 8/26]  PCP/Specialist Visits Compliance with follow-up visit  Education Interventions   Education Provided Provided Education  Provided Verbal Education On Medication, When to see the doctor, Other  [Will take pain meds until seen by pain clinic]       Follow up plan: Follow up call scheduled for 8/28    Encounter Outcome:  Pt. Visit Completed   Kemper Durie, RN, MSN, [facility] Care Management Care Management Coordinator [phone]
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
## 2023-04-16 ENCOUNTER — Telehealth: Payer: Self-pay

## 2023-04-16 NOTE — Telephone Encounter (Unsigned)
Copied from CRM [id number]. Topic: General - Other >> Apr 16, 2023  2:48 PM Franchot Heidelberg wrote: Reason for CRM:  Aram Beecham from [employer] called to follow up on forms that were submitted for the PCP to sign for the patient's wheelchair.  Submitted July 23rd

## 2023-04-17 NOTE — Telephone Encounter (Signed)
Haven't seen them.  

## 2023-04-18 ENCOUNTER — Telehealth: Payer: Self-pay | Admitting: Family Medicine

## 2023-04-22 DIAGNOSIS — M25552 Pain in left hip: Secondary | ICD-10-CM | POA: Diagnosis not present

## 2023-04-22 DIAGNOSIS — G8220 Paraplegia, unspecified: Secondary | ICD-10-CM | POA: Diagnosis not present

## 2023-04-22 DIAGNOSIS — M25551 Pain in right hip: Secondary | ICD-10-CM | POA: Diagnosis not present

## 2023-04-22 DIAGNOSIS — M62838 Other muscle spasm: Secondary | ICD-10-CM | POA: Diagnosis not present

## 2023-04-22 DIAGNOSIS — G894 Chronic pain syndrome: Secondary | ICD-10-CM | POA: Diagnosis not present

## 2023-04-22 DIAGNOSIS — S24109S Unspecified injury at unspecified level of thoracic spinal cord, sequela: Secondary | ICD-10-CM | POA: Diagnosis not present

## 2023-04-23 ENCOUNTER — Encounter: Payer: Self-pay | Admitting: Dermatology

## 2023-04-23 ENCOUNTER — Ambulatory Visit (INDEPENDENT_AMBULATORY_CARE_PROVIDER_SITE_OTHER): Payer: Medicare HMO | Admitting: Dermatology

## 2023-04-23 VITALS — BP 104/74 | HR 91

## 2023-04-23 DIAGNOSIS — L858 Other specified epidermal thickening: Secondary | ICD-10-CM | POA: Diagnosis not present

## 2023-04-23 DIAGNOSIS — L89319 Pressure ulcer of right buttock, unspecified stage: Secondary | ICD-10-CM

## 2023-04-23 DIAGNOSIS — L98491 Non-pressure chronic ulcer of skin of other sites limited to breakdown of skin: Secondary | ICD-10-CM

## 2023-04-23 DIAGNOSIS — D2362 Other benign neoplasm of skin of left upper limb, including shoulder: Secondary | ICD-10-CM

## 2023-04-23 DIAGNOSIS — R21 Rash and other nonspecific skin eruption: Secondary | ICD-10-CM

## 2023-04-23 MED ORDER — DOXYCYCLINE MONOHYDRATE 100 MG PO CAPS
100.0000 mg | ORAL_CAPSULE | Freq: Two times a day (BID) | ORAL | 0 refills | Status: AC
Start: 2023-04-23 — End: 2023-05-07

## 2023-04-23 MED ORDER — TACROLIMUS 0.1 % EX OINT: TOPICAL_OINTMENT | CUTANEOUS | 1 refills | Status: DC

## 2023-04-23 NOTE — Patient Instructions (Addendum)
Biopsy Wound Care Instructions  Leave the original bandage on for 24 hours if possible.  If the bandage becomes soaked or soiled before that time, it is OK to remove it and examine the wound.  A small amount of post-operative bleeding is normal.  If excessive bleeding occurs, remove the bandage, place gauze over the site and apply continuous pressure (no peeking) over the area for 30 minutes. If this does not work, please call our clinic as soon as possible or page your doctor if it is after hours.   Once a day, cleanse the wound with soap and water. It is fine to shower. If a thick crust develops you may use a Q-tip dipped into dilute hydrogen peroxide (mix 1:1 with water) to dissolve it.  Hydrogen peroxide can slow the healing process, so use it only as needed.    After washing, apply petroleum jelly (Vaseline) or an antibiotic ointment if your doctor prescribed one for you, followed by a bandage.    For best healing, the wound should be covered with a layer of ointment at all times. If you are not able to keep the area covered with a bandage to hold the ointment in place, this may mean re-applying the ointment several times a day.  Continue this wound care until the wound has healed and is no longer open.   Itching and mild discomfort is normal during the healing process. However, if you develop pain or severe itching, please call our office.   If you have any discomfort, you can take Tylenol (acetaminophen) or ibuprofen as directed on the bottle. (Please do not take these if you have an allergy to them or cannot take them for another reason).  Some redness, tenderness and white or yellow material in the wound is normal healing.  If the area becomes very sore and red, or develops a thick yellow-green material (pus), it may be infected; please notify us.    If you have stitches, return to clinic as directed to have the stitches removed. You will continue wound care for 2-3 days after the stitches  are removed.   Wound healing continues for up to one year following surgery. It is not unusual to experience pain in the scar from time to time during the interval.  If the pain becomes severe or the scar thickens, you should notify the office.    A slight amount of redness in a scar is expected for the first six months.  After six months, the redness will fade and the scar will soften and fade.  The color difference becomes less noticeable with time.  If there are any problems, return for a post-op surgery check at your earliest convenience.  To improve the appearance of the scar, you can use silicone scar gel, cream, or sheets (such as Mederma or Serica) every night for up to one year. These are available over the counter (without a prescription).  Please call our office at [phone] for any questions or concerns.    For rash at buttock   Stop clotrimazole betamethasone cream   Start tacrolimus 0.1 ointment  - apply on redness at buttocks daily after dressing change and cover with bandage   Continue mupirocin ointment - after dressing change apply topically to ulcers at buttock and cover with bandage   Start Doxycycline 100 mg tab twice daily for 2 weeks take with food and drink.   Doxycycline should be taken with food to prevent nausea. Do not lay down  for 30 minutes after taking. Be cautious with sun exposure and use good sun protection while on this medication. Pregnant women should not take this medication.    Due to recent changes in healthcare laws, you may see results of your pathology and/or laboratory studies on MyChart before the doctors have had a chance to review them. We understand that in some cases there may be results that are confusing or concerning to you. Please understand that not all results are received at the same time and often the doctors may need to interpret multiple results in order to provide you with the best plan of care or course of treatment. Therefore,  we ask that you please give [place] 2 business days to thoroughly review all your results before contacting the office for clarification. Should we see a critical lab result, you will be contacted sooner.   KERATOSIS PILARIS At arms  - Tiny follicular keratotic papules - Benign. Genetic in nature. No cure. - If desired, patient can use an emollient (moisturizer) containing ammonium lactate (AmLactin), urea or salicylic acid once a day to smooth the area  Recommend starting moisturizer with exfoliant (Urea, Salicylic acid, or Lactic acid) one to two times daily to help smooth rough and bumpy skin.  OTC options include Cetaphil Rough and Bumpy lotion (Urea), Eucerin Roughness Relief lotion or spot treatment cream (Urea), CeraVe SA lotion/cream for Rough and Bumpy skin (Sal Acid), Gold Bond Rough and Bumpy cream (Sal Acid), and AmLactin 12% lotion/cream (Lactic Acid).  If applying in morning, also apply sunscreen to sun-exposed areas, since these exfoliating moisturizers can increase sensitivity to sun.   Can apply  tacrolimus ointment to arms 1  to 2 times daily to itchy areas at arms  Gentle Skin Care Guide  1. Bathe no more than once a day.  2. Avoid bathing in hot water  3. Use a mild soap like Dove, Vanicream, Cetaphil, CeraVe. Can use Lever 2000 or Cetaphil antibacterial soap  4. Use soap only where you need it. On most days, use it under your arms, between your legs, and on your feet. Let the water rinse other areas unless visibly dirty.  5. When you get out of the bath/shower, use a towel to gently blot your skin dry, don't rub it.  6. While your skin is still a little damp, apply a moisturizing cream such as Vanicream, CeraVe, Cetaphil, Eucerin, Sarna lotion or plain Vaseline Jelly. For hands apply Neutrogena [place] Hand Cream or Excipial Hand Cream.  7. Reapply moisturizer any time you start to itch or feel dry.  8. Sometimes using free and clear laundry detergents can be helpful.  Fabric softener sheets should be avoided. Downy Free & Gentle liquid, or any liquid fabric softener that is free of dyes and perfumes, it acceptable to use  9. If your doctor has given you prescription creams you may apply moisturizers over them       If You Need Anything After Your Visit  If you have any questions or concerns for your doctor, please call our main line at [phone] and press option 4 to reach your doctor's medical assistant. If no one answers, please leave a voicemail as directed and we will return your call as soon as possible. Messages left after 4 pm will be answered the following business day.   You may also send [place] a message via MyChart. We typically respond to MyChart messages within 1-2 business days.  For prescription refills, please ask your pharmacy  to contact our office. Our fax number is [phone].  If you have an urgent issue when the clinic is closed that cannot wait until the next business day, you can page your doctor at the number below.    Please note that while we do our best to be available for urgent issues outside of office hours, we are not available 24/7.   If you have an urgent issue and are unable to reach [place], you may choose to seek medical care at your doctor's office, retail clinic, urgent care center, or emergency room.  If you have a medical emergency, please immediately call 911 or go to the emergency department.  Pager Numbers  - Dr. Gwen Pounds: [phone]  - Dr. Roseanne Reno: [phone]  In the event of inclement weather, please call our main line at [phone] for an update on the status of any delays or closures.  Dermatology Medication Tips: Please keep the boxes that topical medications come in in order to help keep track of the instructions about where and how to use these. Pharmacies typically print the medication instructions only on the boxes and not directly on the medication tubes.   If your medication is too  expensive, please contact our office at [phone] option 4 or send [place] a message through MyChart.   We are unable to tell what your co-pay for medications will be in advance as this is different depending on your insurance coverage. However, we may be able to find a substitute medication at lower cost or fill out paperwork to get insurance to cover a needed medication.   If a prior authorization is required to get your medication covered by your insurance company, please allow [place] 1-2 business days to complete this process.  Drug prices often vary depending on where the prescription is filled and some pharmacies may offer cheaper prices.  The website www.goodrx.com contains coupons for medications through different pharmacies. The prices here do not account for what the cost may be with help from insurance (it may be cheaper with your insurance), but the website can give you the price if you did not use any insurance.  - You can print the associated coupon and take it with your prescription to the pharmacy.  - You may also stop by our office during regular business hours and pick up a GoodRx coupon card.  - If you need your prescription sent electronically to a different pharmacy, notify our office through [facility] or by phone at [phone] option 4.     Si Usted Necesita Algo Despus de Su Visita  Tambin puede enviarnos un mensaje a travs de Clinical cytogeneticist. Por lo general respondemos a los mensajes de MyChart en el transcurso de 1 a 2 das hbiles.  Para renovar recetas, por favor pida a su farmacia que se ponga en contacto con nuestra oficina. Annie Sable de fax es [place] [phone].  Si tiene un asunto urgente cuando la clnica est cerrada y que no puede esperar hasta el siguiente da hbil, puede llamar/localizar a su doctor(a) al nmero que aparece a continuacin.   Por favor, tenga en cuenta que aunque hacemos todo lo posible para estar disponibles para asuntos urgentes fuera  del horario de [place], no estamos disponibles las 24 horas del da, los 7 [address]  [address] de la [place].   Si tiene un problema urgente y no puede comunicarse con nosotros, puede optar por buscar atencin mdica  en el consultorio de su doctor(a), en una clnica privada, en un centro  de atencin urgente o en una sala de emergencias.  Si tiene Engineer, drilling, por favor llame inmediatamente al 911 o vaya a la sala de emergencias.  Nmeros de bper  - Dr. Gwen Pounds: [phone]  - Dra. Roseanne Reno: [phone]  En caso de inclemencias del [place], por favor llame a Lacy Duverney principal al [phone] para una actualizacin sobre el [place] de cualquier retraso o cierre.  Consejos para la medicacin en dermatologa: Por favor, guarde las cajas en las que vienen los medicamentos de uso tpico para ayudarle a seguir las instrucciones sobre dnde y cmo usarlos. Las farmacias generalmente imprimen las instrucciones del medicamento slo en las cajas y no directamente en los tubos del [place].   Si su medicamento es muy caro, por favor, pngase en contacto con Rolm Gala llamando al [phone] y presione la opcin 4 o envenos un mensaje a travs de Clinical cytogeneticist.   No podemos decirle cul ser su copago por los medicamentos por adelantado ya que esto es diferente dependiendo de la cobertura de su seguro. Sin embargo, es posible que podamos encontrar un medicamento sustituto a Audiological scientist un formulario para que el seguro cubra el medicamento que se considera necesario.   Si se requiere una autorizacin previa para que su compaa de seguros [place] su medicamento, por favor permtanos de 1 a 2 das hbiles para completar [address]  Los precios de los medicamentos varan con frecuencia dependiendo del Environmental consultant de dnde se surte la receta y alguna farmacias pueden ofrecer precios ms baratos.  El sitio web www.goodrx.com tiene cupones para medicamentos de Health and safety inspector. Los precios aqu  no tienen en cuenta lo que podra costar con la ayuda del seguro (puede ser ms barato con su seguro), pero el sitio web puede darle el precio si no utiliz Tourist information centre manager.  - Puede imprimir el cupn correspondiente y llevarlo con su receta a la farmacia.  - Tambin puede pasar por nuestra oficina durante el horario de atencin regular y Education officer, museum una tarjeta de cupones de GoodRx.  - Si necesita que su receta se enve electrnicamente a una farmacia diferente, informe a nuestra oficina a travs de MyChart de [place] o por telfono llamando al [phone] y presione la opcin 4.

## 2023-04-23 NOTE — Progress Notes (Signed)
New Patient Visit   Subjective  Justin [name] is a 42 y.o. male who presents for the following: new patient with wife, reports pressure ulcers at right buttocks. Pt is paraplegic due to accident.  Developed sores months ago from prior hospitalization due to kidney problems. Reports he is currently using clotrimazole-betamethasone cream and mupirocin to area under occlusion. Area is still red and inflamed. Concerned of infection.   Also reports losing hair arms and has noticed tiny bumps at arms. No itching.   The patient has spots, moles and lesions to be evaluated, some may be new or changing and the patient may have concern these could be cancer.   The following portions of the chart were reviewed this encounter and updated as appropriate: medications, allergies, medical history  Review of Systems:  No other skin or systemic complaints except as noted in HPI or Assessment and Plan.  Objective  Well appearing patient in no apparent distress; mood and affect are within normal limits.   A focused examination was performed of the following areas: Buttocks , b/l arms  Relevant exam findings are noted in the Assessment and Plan.     left mid forarm Follicular prominence with hypertrichosis          Assessment & Plan  KERATOSIS PILARIS  At arms  - Tiny follicular keratotic papules with hypotrichosis - Benign. Genetic in nature. No cure. - Observe. - If desired, patient can use an emollient (moisturizer) containing ammonium lactate (AmLactin), urea or salicylic acid once a day to smooth the area  Recommend starting moisturizer with exfoliant (Urea, Salicylic acid, or Lactic acid) one to two times daily to help smooth rough and bumpy skin.  OTC options include Cetaphil Rough and Bumpy lotion (Urea), Eucerin Roughness Relief lotion or spot treatment cream (Urea), CeraVe SA lotion/cream for Rough and Bumpy skin (Sal Acid), Gold Bond Rough and Bumpy cream (Sal Acid), and  AmLactin 12% lotion/cream (Lactic Acid).  If applying in morning, also apply sunscreen to sun-exposed areas, since these exfoliating moisturizers can increase sensitivity to sun.   Rash and other nonspecific skin eruption left mid forarm  R/o KP vs LPP vs Follicular Mucinosis   Skin / nail biopsy - left mid forarm Type of biopsy: punch   Informed consent: discussed and consent obtained   Patient was prepped and draped in usual sterile fashion: Area prepped with alcohol. Anesthesia: the lesion was anesthetized in a standard fashion   Anesthetic:  1% lidocaine w/ epinephrine 1-100,000 buffered w/ 8.4% NaHCO3 Punch size:  3 mm Hemostasis achieved with: pressure and Gelfoam   Hemostasis achieved with comment:  Surgifoam Outcome: patient tolerated procedure well   Post-procedure details: wound care instructions given   Post-procedure details comment:  Ointment and small bandage applied  Specimen 1 - Surgical pathology Differential Diagnosis: R/o KP vs LPP vs Follicular Mucinosis   Check Margins: No  Related Medications doxycycline (MONODOX) 100 MG capsule Take 1 capsule (100 mg total) by mouth 2 (two) times daily for 14 days. Take with food and drink  Non-pressure chronic ulcer of skin of other sites limited to breakdown of skin (HCC)  Related Medications tacrolimus (PROTOPIC) 0.1 % ointment After dressing change apply topically daily to rash at buttock and cover with bandage   Pressure ulcer at right buttock Exam: Inflamed erythematous patch with skin atrophy and 2 small ulcerations at right medial buttock perianal area.  Photos today   Chronic and persistent condition with duration or expected duration over  one year. Condition is bothersome/symptomatic for patient. Currently flared. With steroid skin atrophy  Treatment Plan: Stop clotrimazole-betamethasone cream due to skin atrophy  (Apply after dressing change) Start Tacrolimus ointment - apply to inflamed skin  daily Along with Mupirocin ointment to any ulcerations at aa' daily and cover with bandage. Continue cushioned seats to minimize pressure to sacral area  Start doxycycline 100 mg cap - take 1 cap bid with food and drink for 2 weeks.   Doxycycline should be taken with food to prevent nausea. Do not lay down for 30 minutes after taking. Be cautious with sun exposure and use good sun protection while on this medication. Pregnant women should not take this medication.     Return for 4 - 6 week follow up for ulcer with Dr. Katrinka Blazing .  I, Asher Muir, CMA, am acting as scribe for Willeen Niece, MD.   Documentation: I have reviewed the above documentation for accuracy and completeness, and I agree with the above.  Willeen Niece, MD

## 2023-04-26 ENCOUNTER — Encounter: Payer: Self-pay | Admitting: Dermatology

## 2023-04-26 ENCOUNTER — Encounter: Payer: Self-pay | Admitting: Family Medicine

## 2023-04-29 ENCOUNTER — Other Ambulatory Visit: Payer: Self-pay | Admitting: Family Medicine

## 2023-04-29 DIAGNOSIS — G894 Chronic pain syndrome: Secondary | ICD-10-CM

## 2023-04-30 ENCOUNTER — Telehealth: Payer: Self-pay

## 2023-04-30 DIAGNOSIS — N390 Urinary tract infection, site not specified: Secondary | ICD-10-CM | POA: Diagnosis not present

## 2023-04-30 DIAGNOSIS — R252 Cramp and spasm: Secondary | ICD-10-CM | POA: Diagnosis not present

## 2023-04-30 DIAGNOSIS — N3289 Other specified disorders of bladder: Secondary | ICD-10-CM | POA: Diagnosis not present

## 2023-04-30 DIAGNOSIS — N319 Neuromuscular dysfunction of bladder, unspecified: Secondary | ICD-10-CM | POA: Diagnosis not present

## 2023-04-30 DIAGNOSIS — K5900 Constipation, unspecified: Secondary | ICD-10-CM | POA: Diagnosis not present

## 2023-04-30 DIAGNOSIS — G8222 Paraplegia, incomplete: Secondary | ICD-10-CM | POA: Diagnosis not present

## 2023-04-30 NOTE — Telephone Encounter (Signed)
-----   Message from Willeen Niece sent at 04/30/2023  5:12 PM EDT ----- Skin , left mid foerarm MILD PAPILLOMATOSIS, SEE DESCRIPTION.  Benign skin change, c/w keratosis pilaris - please call patient

## 2023-04-30 NOTE — Telephone Encounter (Signed)
Advised pt of bx results/sh ?

## 2023-05-01 ENCOUNTER — Other Ambulatory Visit: Payer: Self-pay | Admitting: Family Medicine

## 2023-05-01 DIAGNOSIS — G894 Chronic pain syndrome: Secondary | ICD-10-CM

## 2023-05-01 NOTE — Telephone Encounter (Signed)
Medication Refill - Medication: oxyCODONE (ROXICODONE) 15 MG immediate release tablet, methadone (DOLOPHINE) 10 MG tablet   Stated went to the pain clinic but isn't sure that he wants to "swap to them." Stated scheduled MyChart visit to discuss it with Dr. [name] on 08/16.  Has the patient contacted their pharmacy? No. No, more refills. (Agent: If no, request that the patient contact the pharmacy for the refill. If patient does not wish to contact the pharmacy document the reason why and proceed with request.)   Preferred Pharmacy (with phone number or street name):  [facility] - [place], [place] - [address]  [address] [place] [address]  Phone: [phone] Fax: [phone]  Hours: Not open 24 hours   Has the patient been seen for an appointment in the last year OR does the patient have an upcoming appointment? Yes.    Agent: Please be advised that RX refills may take up to 3 business days. We ask that you follow-up with your pharmacy.

## 2023-05-02 ENCOUNTER — Other Ambulatory Visit: Payer: Self-pay | Admitting: Family Medicine

## 2023-05-02 MED ORDER — METHADONE HCL 10 MG PO TABS
20.0000 mg | ORAL_TABLET | Freq: Four times a day (QID) | ORAL | 0 refills | Status: DC
Start: 2023-05-02 — End: 2023-05-27

## 2023-05-02 MED ORDER — OXYCODONE HCL 15 MG PO TABS
ORAL_TABLET | ORAL | 0 refills | Status: DC
Start: 2023-05-02 — End: 2023-05-27

## 2023-05-02 NOTE — Telephone Encounter (Signed)
Requested medication (s) are due for refill today: no  Requested medication (s) are on the active medication list: yes  Last refill:  05/02/23  Future visit scheduled: yes  Notes to clinic:  Unable to refill per protocol, cannot delegate.  Routing for refusal, medication was refilled 05/02/23, unable to refuse.     Requested Prescriptions  Pending Prescriptions Disp Refills   oxyCODONE (ROXICODONE) 15 MG immediate release tablet 180 tablet 0     Not Delegated - Analgesics:  Opioid Agonists Failed - 05/01/2023  1:43 PM      Failed - This refill cannot be delegated      Passed - Urine Drug Screen completed in last 360 days      Passed - Valid encounter within last 3 months    Recent Outpatient Visits           2 months ago Tinea cruris   [facility] Malva Limes, MD   2 months ago Recurrent UTI   [facility] Malva Limes, MD   3 months ago Spasticity   [facility] Malva Limes, MD   3 months ago Sepsis due to urinary tract infection [facility])   [facility] Malva Limes, MD   5 months ago Recurrent UTI   [facility] Malva Limes, MD       Future Appointments             Tomorrow Malva Limes, MD [facility]   In 2 weeks Elie Goody, MD [facility]             methadone (DOLOPHINE) 10 MG tablet 240 tablet 0    Sig: Take 2 tablets (20 mg total) by mouth every 6 (six) hours.     Not Delegated - Analgesics:  Opioid Agonists - methadone hydrochloride Failed - 05/01/2023  1:43 PM      Failed - This refill cannot be delegated      Failed - Cr in normal range and within 360 days    Creatinine  Date Value Ref Range Status  06/28/2014 0.82 0.60 - 1.30 mg/dL Final   Creatinine, Ser  Date Value Ref Range Status  01/14/2023 0.72 (L) 0.76 - 1.27 mg/dL Final          Failed - AST in normal range and within 360 days    AST  Date Value Ref Range Status  01/06/2023 57 (H) 15 - 41 U/L Final   SGOT(AST)  Date Value Ref Range Status  06/25/2014 15 15 - 37 Unit/L Final         Failed - ALT in normal range and within 360 days    ALT  Date Value Ref Range Status  01/06/2023 100 (H) 0 - 44 U/L Final   SGPT (ALT)  Date Value Ref Range Status  06/25/2014 18 U/L Final    Comment:    14-63 NOTE: New Reference Range 04/06/14          Passed - Urine Drug Screen completed in last 360 days      Passed - Valid encounter within last 3 months    Recent Outpatient Visits           2 months ago Tinea cruris    [facility], MD   2 months ago Recurrent UTI   [facility] [name], Demetrios Isaacs, MD   3 months ago Spasticity   [facility] Malva Limes, MD   3 months ago Sepsis due to urinary tract infection [facility])   [place] [facility] Malva Limes, MD   5 months ago Recurrent UTI   [facility] Malva Limes, MD       Future Appointments             Tomorrow [name], Demetrios Isaacs, MD [facility]   In 2 weeks Elie Goody, MD [facility]

## 2023-05-03 ENCOUNTER — Telehealth: Payer: Medicare HMO | Admitting: Family Medicine

## 2023-05-03 DIAGNOSIS — N319 Neuromuscular dysfunction of bladder, unspecified: Secondary | ICD-10-CM

## 2023-05-03 DIAGNOSIS — M62838 Other muscle spasm: Secondary | ICD-10-CM

## 2023-05-03 DIAGNOSIS — Z993 Dependence on wheelchair: Secondary | ICD-10-CM | POA: Diagnosis not present

## 2023-05-03 DIAGNOSIS — G894 Chronic pain syndrome: Secondary | ICD-10-CM

## 2023-05-03 DIAGNOSIS — G8220 Paraplegia, unspecified: Secondary | ICD-10-CM | POA: Diagnosis not present

## 2023-05-03 DIAGNOSIS — S24109S Unspecified injury at unspecified level of thoracic spinal cord, sequela: Secondary | ICD-10-CM | POA: Diagnosis not present

## 2023-05-03 MED ORDER — OXYCODONE HCL 15 MG PO TABS
ORAL_TABLET | ORAL | 0 refills | Status: DC
Start: 2023-05-03 — End: 2023-05-13

## 2023-05-03 MED ORDER — METHADONE HCL 10 MG PO TABS
20.0000 mg | ORAL_TABLET | Freq: Four times a day (QID) | ORAL | 0 refills | Status: DC
Start: 2023-05-03 — End: 2023-05-13

## 2023-05-13 ENCOUNTER — Encounter: Payer: Self-pay | Admitting: Physical Medicine and Rehabilitation

## 2023-05-13 ENCOUNTER — Encounter: Payer: Medicare HMO | Attending: Physical Medicine and Rehabilitation | Admitting: Physical Medicine and Rehabilitation

## 2023-05-13 VITALS — BP 136/79 | HR 95 | Ht 76.0 in | Wt 172.8 lb

## 2023-05-13 DIAGNOSIS — G894 Chronic pain syndrome: Secondary | ICD-10-CM | POA: Diagnosis not present

## 2023-05-13 DIAGNOSIS — M62838 Other muscle spasm: Secondary | ICD-10-CM | POA: Diagnosis not present

## 2023-05-13 DIAGNOSIS — Z993 Dependence on wheelchair: Secondary | ICD-10-CM | POA: Insufficient documentation

## 2023-05-13 DIAGNOSIS — S24109S Unspecified injury at unspecified level of thoracic spinal cord, sequela: Secondary | ICD-10-CM | POA: Diagnosis not present

## 2023-05-13 MED ORDER — DANTROLENE SODIUM 100 MG PO CAPS: 100.0000 mg | ORAL_CAPSULE | Freq: Three times a day (TID) | ORAL | 1 refills | Status: DC

## 2023-05-13 NOTE — Progress Notes (Signed)
MyChart Video Visit    Virtual Visit via Video Note   This format is felt to be most appropriate for this patient at this time. Physical exam was limited by quality of the video and audio technology used for the visit.   Patient location: home Provider location: [facility]  I discussed the limitations of evaluation and management by telemedicine and the availability of in person appointments. The patient expressed understanding and agreed to proceed.  Patient: Justin [name]   DOB: Oct 05, 1980   42 y.o. Male  MRN: [id number] Visit Date: 05/03/2023  Today's healthcare provider: Mila Merry, MD   Chief Complaint  Patient presents with   Medication Consultation    Speak with Dr.Elbridge Magowan about what pain clinic said. I'd like his thoughts b4 changing. I'm not sure I want to change. I'd like to get his thoughts.   Subjective    Discussed the use of AI scribe software for clinical note transcription with the patient, who gave verbal consent to proceed.  History of Present Illness   The patient, with a history of chronic pain, recently consulted with a pain clinic. He reports that his body has become increasingly rigid due to an inability to stretch, which is progressively worsening. The patient expresses concern about the logistics of the pain clinic, including the monthly travel to [place], which is physically and financially burdensome.  The patient is currently on Baclofen and Xanax, among other medications. The pain clinic indicated that they would manage all medications except for Baclofen and Xanax, which would need to be prescribed by the primary care provider. The patient expressed interest in switching from Xanax to Valium due to concerns about a family history of dementia and a perceived lower risk with Valium.       Medications: Outpatient Medications Prior to Visit  Medication Sig   albuterol (VENTOLIN HFA) 108 (90 Base) MCG/ACT inhaler Inhale 2 puffs into the lungs  every 6 (six) hours as needed for wheezing or shortness of breath.   alprazolam (XANAX) 2 MG tablet TAKE ONE TABLET EVERY SIX HOURS IF NEEDED FOR ANXIETY   amphetamine-dextroamphetamine (ADDERALL) 15 MG tablet Take 1 tablet by mouth 2 (two) times daily.   bacitracin ointment Apply topically 2 (two) times daily.   baclofen (LIORESAL) 20 MG tablet Take 1 tablet (20 mg total) by mouth 4 (four) times daily as needed for muscle spasms.   cefdinir (OMNICEF) 300 MG capsule Take two tablets once a day for 7 days, then take 1 tablet once a day   dantrolene (DANTRIUM) 50 MG capsule Take 1 capsule (50 mg total) by mouth at bedtime. X  week, then 50 mg BID x 1 week, then TID x 1 week, then 100 mg BID- for spasticity in addition to Baclofen   docusate sodium (COLACE) 250 MG capsule Take 1 capsule (250 mg total) by mouth 4 (four) times daily as needed.   [EXPIRED] doxycycline (MONODOX) 100 MG capsule Take 1 capsule (100 mg total) by mouth 2 (two) times daily for 14 days. Take with food and drink   fesoterodine (TOVIAZ) 8 MG TB24 tablet TAKE 1 TABLET BY MOUTH DAILY   flavoxATE (URISPAS) 100 MG tablet Take 1 tablet (100 mg total) by mouth 3 (three) times daily as needed for bladder spasms.   leptospermum manuka honey (MEDIHONEY) PSTE paste Apply 1 Application topically daily.   levothyroxine (SYNTHROID) 88 MCG tablet TAKE ONE TABLET BY MOUTH EVERY DAY   linaclotide (LINZESS) 145 MCG CAPS capsule  Take 145 mcg by mouth daily before breakfast.   lubiprostone (AMITIZA) 24 MCG capsule Take 1 capsule (24 mcg total) by mouth 2 (two) times daily with a meal. For opioid and Spinal cord injury based constipation-   meclizine (ANTIVERT) 25 MG tablet Take by mouth.   methadone (DOLOPHINE) 10 MG tablet Take 2 tablets (20 mg total) by mouth every 6 (six) hours.   methadone (DOLOPHINE) 10 MG tablet Take 2 tablets (20 mg total) by mouth every 6 (six) hours.   Mineral Oil OIL daily.   mupirocin ointment (BACTROBAN) 2 % Apply 1  Application topically 2 (two) times daily.   naproxen sodium (ANAPROX) 550 MG tablet TAKE 1 TABLET BY MOUTH 2 TIMES DAILY WITH A MEAL.   omeprazole (PRILOSEC) 20 MG capsule TAKE 1 CAPSULE BY MOUTH ONCE DAILY   oxybutynin (DITROPAN) 5 MG tablet Take 5 mg by mouth daily as needed.   oxyCODONE (ROXICODONE) 15 MG immediate release tablet TAKE ONE TABLET BY MOUTH EVERY 4-5 HOURSAS NEEDED FOR PAIN. NO MORE THAN 5 IN A DAY.   oxyCODONE (ROXICODONE) 15 MG immediate release tablet TAKE ONE TABLET BY MOUTH EVERY 4-5 HOURSAS NEEDED FOR PAIN. NO MORE THAN 5 IN A DAY.   pregabalin (LYRICA) 150 MG capsule Take one tablet two or three times a day as needed   promethazine (PHENERGAN) 25 MG suppository Place 1 suppository (25 mg total) rectally every 6 (six) hours as needed for nausea or vomiting.   promethazine (PHENERGAN) 25 MG tablet TAKE 1 TABLET BY MOUTH EVERY 4 HOURS AS NEEDED FOR NAUSEA AND VOMITING   silver sulfADIAZINE (SSD) 1 % cream APPLY TO AFFECTED AREAS TOPICALLY TWICE DAILY   sodium fluoride (DENTAGEL) 1.1 % GEL dental gel as needed.   tacrolimus (PROTOPIC) 0.1 % ointment After dressing change apply topically daily to rash at buttock and cover with bandage   tiZANidine (ZANAFLEX) 4 MG tablet TAKE 2 TABLETS BY MOUTH 4 TIMES DAILY   triamcinolone ointment (KENALOG) 0.5 % Apply twice a day to affected area on left hand   [DISCONTINUED] sildenafil (REVATIO) 20 MG tablet Take 3 to 5 tablets two hours before intercouse on an empty stomach.  Do not take with nitrates.   No facility-administered medications prior to visit.     Objective    There were no vitals taken for this visit.  Physical Exam  Awake, alert, oriented x 3. In no apparent distress        Assessment & Plan       Chronic Pain Management Patient reports increasing body stiffness due to inability to stretch. Recently had a consultation with a pain clinic, but has concerns about the travel and lack of clear communication. No changes  were suggested to manage spasms. -Contact the pain clinic to discuss his long-term treatment plan and any recommendations he may have.  Benzodiazepine Use Patient currently on Xanax, but has concerns about potential link to dementia. Expressed interest in switching to Valium. -Consider discussing the risks and benefits of switching from Xanax to Valium at the next appointment.  Medication Management Pain clinic indicated he would need the primary care provider to continue prescribing Baclofen. -Continue to prescribe Baclofen as indicated.  Follow-up Patient has a follow-up appointment scheduled with the pain clinic. -Review notes from the pain clinic when he becomes available. -Check in with the patient after his next appointment with the pain clinic.          I discussed the assessment and treatment plan  with the patient. The patient was provided an opportunity to ask questions and all were answered. The patient agreed with the plan and demonstrated an understanding of the instructions.   The patient was advised to call back or seek an in-person evaluation if the symptoms worsen or if the condition fails to improve as anticipated.  I provided 12 minutes of non-face-to-face time during this encounter.    Mila Merry, MD [facility] [phone] (phone) [phone] (fax)  [facility]

## 2023-05-13 NOTE — Progress Notes (Signed)
Subjective:    Patient ID: Justin [name], male    DOB: 1981-04-16, 42 y.o.   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  Here for f/u  of SCI.    Saw  Maree Erie- [facility] PA vs FNP Wrote for Keflex for prophylaxis;  prior auth- Fosfomycin- 1x/week.    Was told constipation could be causing bladder infections.   Wrote for Amitiza at last visit- but insurance wouldn't cover it.   Having to do  enema q3 days- fleets brand-  bisacodyl enema  Linzess- 145 mcg/day Prior to 15 mg Bisacodyl 12 hours before enema-    If doesn't do, gets nauseated and cannot get much oout.   Only 1 bladder infections since last saw me.  Don't don't if gabapentin has helped the extreme pain- that gets with UTI- but hard to know since only had 1 UTI in last 3 months.   Went to [facility] to get pain meds- they want to keep pain meds at same level.  Hasn't seen anyone else- so far.   Cannot stretch anymore since pain makes it so much worse in spasticity.  Stuck in bed 24/7-  Needs hip replaced.  Actually wants to have hip "cut off".   When had episode of pain awful, broke bed in hospital and needed to be put to sleep to compensate for pain.     We raised baclofen to 30 mg QID Doesn't feel like helped much-  So went back to 20 mg QID-   Wasn't able to stretch more.  Cannot stop the pain when spasms setting him off.  Dreads having to get out of house- comes at night.  Even from this outing to see me today- will "suffer for 2-3 days".   Pain can go from 0/10 to "100/10" feeling like legs being broken.   Has tried Zanaflex and Baclofen- can feel when starts ot wear off.  Dantrolene is working better than baclofen-  Feels spasticity better a lot more comfortable since started dantrolene, but  cannot get OOB or spasms gets worse/overwhelming.   Tried Lyrica in place of gabapentin.  150 mg BID right now.        Pain Inventory Average Pain 5 Pain Right Now 3 My pain is intermittent, sharp, burning, dull, stabbing, tingling, and aching  LOCATION OF PAIN  LEGS, BACK, NECK, FEET  BOWEL Number of stools per week: 2-3 Oral laxative use Yes  Type of laxative  Enema or suppository use Yes   BLADDER  In and out cath, frequency 6 times a day Able to self cath Yes     Mobility ability to climb steps?  no do you drive?  yes use a wheelchair transfers alone  Function disabled: date disabled 2000 I need assistance with the following:  meal prep and household duties Do you have any goals in this area?  yes  Neuro/Psych bladder control problems bowel control problems weakness numbness trouble walking  Prior Studies Any changes since last visit?  no  Physicians involved in your care Any changes since last visit?  yes Dr. Aura Camps - Maree Erie- FNP- with [facility] (Urologist)   Family History  Problem Relation Age of Onset   Other Mother    Hypertension  Father    Gout Father    Prostate cancer Neg Hx    Kidney cancer Neg Hx    Social History   Socioeconomic History   Marital status: Married    Spouse name: Not on file   Number of children: Not on file   Years of education: Not on file   Highest education level: Not on file  Occupational History   Occupation: unemployed  Tobacco Use   Smoking status: Former    Passive exposure: Past   Smokeless tobacco: Never   Tobacco comments:    Using Nicorette Gum  Vaping Use   Vaping status: Never Used  Substance and Sexual Activity   Alcohol use: Yes    Alcohol/week: 0.0 standard drinks of alcohol    Comment: occasionaly-rare   Drug use: No   Sexual activity: Not on file  Other Topics Concern   Not on file  Social History Narrative   Not on file   Social Determinants of Health   Financial Resource  Strain: Medium Risk (01/15/2023)   Overall Financial Resource Strain (CARDIA)    Difficulty of Paying Living Expenses: Somewhat hard  Food Insecurity: No Food Insecurity (01/15/2023)   Hunger Vital Sign    Worried About Running Out of Food in the Last Year: Never true    Ran Out of Food in the Last Year: Never true  Transportation Needs: No Transportation Needs (01/15/2023)   PRAPARE - Administrator, Civil Service (Medical): No    Lack of Transportation (Non-Medical): No  Physical Activity: Not on file  Stress: Stress Concern Present (01/15/2023)   [employer] of Occupational Health - Occupational Stress Questionnaire    Feeling of Stress : To some extent  Social Connections: Unknown (02/26/2023)   [facility]   Social Network    Social Network: Not on file  Recent Concern: Social Connections - Socially Isolated (01/15/2023)   Social Connection and Isolation Panel [NHANES]    Frequency of Communication with Friends and Family: Twice a week    Frequency of Social Gatherings with Friends and Family: Never    Attends Religious Services: Never    Database administrator or Organizations: No    Attends Engineer, structural: Never    Marital Status: Married   Past Surgical History:  Procedure Laterality Date   BACK SURGERY  2000   Fracture T9 and L3 with paraplegia   Past Medical History:  Diagnosis Date   Arthritis    Asthma    Chronic anxiety    Chronic depression    Chronic pain syndrome    secondary to fracture T9 and L3 with extensive spinal surgeery   Postoperative wound infection of right hip 2012   Rheumatic fever    BP 136/79   Pulse 95   Ht 6\' 4"  (1.93 m)   Wt 172 lb 12.8 oz (78.4 kg)   SpO2 95%   BMI 21.03 kg/m   Opioid Risk Score:   Fall Risk Score:  `1  Depression screen PHQ 2/9     05/13/2023    1:34 PM 02/13/2023   10:34 AM 01/15/2023    1:12 PM 01/10/2022    3:36 PM 12/05/2021    4:26 PM 05/10/2017     3:58 PM  Depression screen PHQ 2/9  Decreased Interest 0 0 1 1 3 3   Down, Depressed, Hopeless  2 2  2 2   PHQ - 2 Score 0 2 3  1 5 5   Altered sleeping  3 1 1 3 3   Tired, decreased energy  0 1 1 3 3   Change in appetite  3 1 1 3 3   Feeling bad or failure about yourself   3 1 1 3 3   Trouble concentrating  3 1 1 2 2   Moving slowly or fidgety/restless  0 1 0 0 0  Suicidal thoughts  0 0  0 0  PHQ-9 Score  14 9 6 19 19   Difficult doing work/chores   Somewhat difficult Somewhat difficult -- Very difficult    Review of Systems  Musculoskeletal:  Positive for back pain and gait problem. Negative for arthralgias.       Pain in both ankles, pain in hips  Neurological:  Positive for weakness, numbness and headaches.  Psychiatric/Behavioral:         Anxiety   All other systems reviewed and are negative.      Objective:   Physical Exam  Awake alert, appropriate, verbose, in manual w/c; and wife in room, NAD Feet cannot stay on w/c foot pedals.        Assessment & Plan:   Pt is a 42 yr old male with hx of an incomplete thoracic T9 also had L3 injury as well-  Paraplegia since March 01 1999-at age 38-  Also has TBI- with cracked skull-   With neurogenic bowel and bladder and spasticity.  Here for f/u  of SCI.    Check CMP- to check kidneys and LFTs since on Dantrolene. Is due!   2. Con't Dantrolene  100 mg and increase to  3x/day- change to 100 mg tabs 2x/day # 3 months supply-  1 refill.    3.    Xanax doesn't increase dementia risk- but can increase cognitive impairment when on the meds.   4. Rather have you on Valium than on Xanax. On 2 mg 4x/day-   5. Advised going to  be difficult to give Adderall, Xanax, Methadone 4x/day-  and Oxycodone- to find 488 mcg milliequivalents/day.   6.  Didn't notice any side effects when increase baclofen, just didn't help enough-    7. ITB pump trial MIGHT be worthwhile, BUT has increased risk of infection due to frequent UTI's.    8.  PO baclofen doesn't equal Intrathecal baclofen- since 90% is filtered out by blood brian barrier. Sometimes it's our only options.   9. Needs to get hips done before would do an ITB pump. But only does 1 at a time.   10. Might be better way to address things is to think about using more spasticity meds, not pain meds.    11.  F/U in 3months double- appt- SCI  I spent a total of  49  minutes on total care today- >50% coordination of care- due to discussing spasticity, pain meds and ITB pump-

## 2023-05-13 NOTE — Patient Instructions (Signed)
Pt is a 42 yr old male with hx of an incomplete thoracic T9 also had L3 injury as well-  Paraplegia since March 01 1999-at age 2-  Also has TBI- with cracked skull-   With neurogenic bowel and bladder and spasticity.  Here for f/u  of SCI.    Check CMP- to check kidneys and LFTs since on Dantrolene. Is due!   2. Con't Dantrolene  100 mg and increase to  3x/day- change to 100 mg tabs 2x/day # 3 months supply-  1 refill.    3.    Xanax doesn't increase dementia risk- but can increase cognitive impairment when on the meds.   4. Rather have you on Valium than on Xanax. On 2 mg 4x/day-   5. Advised going to  be difficult to give Adderall, Xanax, Methadone 4x/day-  and Oxycodone- to find 488 mcg milliequivalents/day.   6.  Didn't notice any side effects when increase baclofen, just didn't help enough-    7. ITB pump trial MIGHT be worthwhile, BUT has increased risk of infection due to frequent UTI's.    8. PO baclofen doesn't equal Intrathecal baclofen- since 90% is filtered out by blood brian barrier. Sometimes it's our only options.   9. Needs to get hips done before would do an ITB pump. But only does 1 at a time.   10. Might be better way to address things is to think about using more spasticity meds, not pain meds.    11.  F/U in 3months double- appt- SCI

## 2023-05-14 LAB — COMPREHENSIVE METABOLIC PANEL
ALT: 14 IU/L (ref 0–44)
AST: 15 IU/L (ref 0–40)
Albumin: 5 g/dL (ref 4.1–5.1)
Alkaline Phosphatase: 68 IU/L (ref 44–121)
BUN/Creatinine Ratio: 22 — ABNORMAL HIGH (ref 9–20)
BUN: 16 mg/dL (ref 6–24)
Bilirubin Total: 0.3 mg/dL (ref 0.0–1.2)
CO2: 26 mmol/L (ref 20–29)
Calcium: 9.8 mg/dL (ref 8.7–10.2)
Chloride: 98 mmol/L (ref 96–106)
Creatinine, Ser: 0.72 mg/dL — ABNORMAL LOW (ref 0.76–1.27)
Globulin, Total: 2.5 g/dL (ref 1.5–4.5)
Glucose: 88 mg/dL (ref 70–99)
Potassium: 4.1 mmol/L (ref 3.5–5.2)
Sodium: 138 mmol/L (ref 134–144)
Total Protein: 7.5 g/dL (ref 6.0–8.5)
eGFR: 117 mL/min/{1.73_m2} (ref >59)

## 2023-05-15 ENCOUNTER — Encounter: Payer: Self-pay | Admitting: Family Medicine

## 2023-05-15 ENCOUNTER — Ambulatory Visit: Payer: Self-pay | Admitting: *Deleted

## 2023-05-15 NOTE — Patient Outreach (Signed)
  Care Coordination   Follow Up Visit Note   05/15/2023 Name: Justin [name] MRN: [id number] DOB: December 24, 1980  Justin [name] is a 42 y.o. year old male who sees [name], Justin Isaacs, MD for primary care. I spoke with  [name] by phone today.  What matters to the patients health and wellness today?  Will continue current medical management, searching for provider that will increase pain medication.  Will contact this care manager if he has any questions in the future.     Goals Addressed             This Visit's Progress    COMPLETED: Effective management of muscle spasms   On track    Care Coordination Interventions: Evaluation of current treatment plan related to paraplegia with muscle spasms and patient's adherence to plan as established by provider Advised patient to continue current recommendations for pain control and spasms Discussed plans with patient for ongoing care management follow up and provided patient with direct contact information for care management team         SDOH assessments and interventions completed:  No     Care Coordination Interventions:  Yes, provided   Interventions Today    Flowsheet Row Most Recent Value  Chronic Disease   Chronic disease during today's visit Other  [chronic pain and spasms]  General Interventions   General Interventions Discussed/Reviewed General Interventions Reviewed, Doctor Visits  Doctor Visits Discussed/Reviewed Doctor Visits Reviewed, PCP, Specialist  [spine specialist on 9/30]  PCP/Specialist Visits Compliance with follow-up visit  [Seen by pain specialist, will also stay connected with neurology.  He was not happy about pain clinic not increasing his medication, will follow orders of spine specialist]  Education Interventions   Education Provided Provided Education  Provided Verbal Education On [facility], Other, When to see the doctor  [Educated on process of pain  medication management given opiod crisis, he does not agree this should be process, will continue looking for pain provider that will alter medications and/or relocate back to [place]]  Mental Health Interventions   Mental Health Discussed/Reviewed Mental Health Discussed, Anxiety, Other  [Encouraged to re-establish with psych]       Follow up plan: No further intervention required.   Encounter Outcome:  Pt. Visit Completed   Kemper Durie, RN, MSN, [facility] Care Management Care Management Coordinator [phone]

## 2023-05-21 ENCOUNTER — Ambulatory Visit: Payer: Medicare HMO | Admitting: Dermatology

## 2023-05-27 ENCOUNTER — Other Ambulatory Visit: Payer: Self-pay | Admitting: Family Medicine

## 2023-05-27 DIAGNOSIS — G894 Chronic pain syndrome: Secondary | ICD-10-CM

## 2023-05-30 ENCOUNTER — Other Ambulatory Visit: Payer: Self-pay | Admitting: Family Medicine

## 2023-05-30 DIAGNOSIS — M25559 Pain in unspecified hip: Secondary | ICD-10-CM

## 2023-05-30 MED ORDER — METHADONE HCL 10 MG PO TABS
20.0000 mg | ORAL_TABLET | Freq: Four times a day (QID) | ORAL | 0 refills | Status: DC
Start: 2023-05-30 — End: 2023-06-28

## 2023-05-30 MED ORDER — OXYCODONE HCL 15 MG PO TABS
ORAL_TABLET | ORAL | 0 refills | Status: DC
Start: 2023-05-30 — End: 2023-06-27

## 2023-06-04 ENCOUNTER — Ambulatory Visit: Payer: Self-pay | Admitting: *Deleted

## 2023-06-04 DIAGNOSIS — G8220 Paraplegia, unspecified: Secondary | ICD-10-CM | POA: Diagnosis not present

## 2023-06-04 DIAGNOSIS — S24109S Unspecified injury at unspecified level of thoracic spinal cord, sequela: Secondary | ICD-10-CM | POA: Diagnosis not present

## 2023-06-04 DIAGNOSIS — R350 Frequency of micturition: Secondary | ICD-10-CM

## 2023-06-04 DIAGNOSIS — G894 Chronic pain syndrome: Secondary | ICD-10-CM | POA: Diagnosis not present

## 2023-06-04 DIAGNOSIS — K5909 Other constipation: Secondary | ICD-10-CM | POA: Diagnosis not present

## 2023-06-04 DIAGNOSIS — N390 Urinary tract infection, site not specified: Secondary | ICD-10-CM | POA: Diagnosis not present

## 2023-06-04 NOTE — Telephone Encounter (Signed)
Summary: soreness in sides, urine burning and smell    pt called in says has smell and burning with urine , he wants his wife to bring in sample to have tested He also says his sides are sore       Reason for Disposition  All other males with painful urination    Patient request to bring urine for culture  Answer Assessment - Initial Assessment Questions 1. SEVERITY: "How bad is the pain?"  (e.g., Scale 1-10; mild, moderate, or severe)   - MILD (1-3): Complains slightly about urination hurting.   - MODERATE (4-7): Interferes with normal activities.     - SEVERE (8-10): Excruciating, unwilling or unable to urinate because of the pain.      Moderate- low burn 2. FREQUENCY: "How many times have you had painful urination today?"      Slow burn in urethra and bladder 3. PATTERN: "Is pain present every time you urinate or just sometimes?"      Getting worse- has odor today 4. ONSET: "When did the painful urination start?"      2 days 5. FEVER: "Do you have a fever?" If Yes, ask: "What is your temperature, how was it measured, and when did it start?"     No- uses Advil 4 times/day 6. PAST UTI: "Have you had a urine infection before?" If Yes, ask: "When was the last time?" and "What happened that time?"      1-2 months ago- in hospital 7. CAUSE: "What do you think is causing the painful urination?"      UTI 8. OTHER SYMPTOMS: "Do you have any other symptoms?" (e.g., flank pain, penis discharge, scrotal pain, blood in urine)     Side pain, burning in bladder, odor, flank sore- bilateral  Protocols used: Urination Pain - Male-A-AH

## 2023-06-04 NOTE — Telephone Encounter (Signed)
  Chief Complaint: request urine culture order Symptoms: Side pain, burning in bladder, odor, flank sore- bilateral , burning with urination Frequency: symptoms started 2 days ago Pertinent Negatives: Patient denies fever, blood in urine Disposition: [] ED /[] Urgent Care (no appt availability in office) / [] Appointment(In office/virtual)/ []  [facility]/ [] Home Care/ [] Refused Recommended Disposition /[] [place]/ [x]  Follow-up with PCP Additional Notes: Patient has been advised PCP is not in office this afternoon. Patient has seen urology and is trying new preventative- advised he may need to call them for response today- but I will send his request.

## 2023-06-05 ENCOUNTER — Telehealth: Payer: Self-pay

## 2023-06-05 DIAGNOSIS — R350 Frequency of micturition: Secondary | ICD-10-CM | POA: Diagnosis not present

## 2023-06-05 DIAGNOSIS — R339 Retention of urine, unspecified: Secondary | ICD-10-CM | POA: Diagnosis not present

## 2023-06-05 DIAGNOSIS — R3589 Other polyuria: Secondary | ICD-10-CM | POA: Diagnosis not present

## 2023-06-05 NOTE — Telephone Encounter (Addendum)
FYI Patient reports that Urology told him to bring urine to [place] and have urine tested and send UCx results to [facility].  Telephone [phone] [place] Other

## 2023-06-05 NOTE — Telephone Encounter (Signed)
Per patient through MyChart and missing appointment: "  I'm healing greatly so I don't think I need another appointment. I'll add a picture of the biopsy spot just incase, so the doc can see it seems to be healing well. Thank you for your help and again I'm so sorry I missed the appointment. I can't believe I have absolutely no memory of making the appointment. But extremely vivid memory of the doc cutting the fat from the biopsy as she pulled it out. lol "

## 2023-06-05 NOTE — Addendum Note (Signed)
Addended by: Debera Lat on: 06/05/2023 11:58 AM   Modules accepted: Orders

## 2023-06-10 ENCOUNTER — Telehealth: Payer: Self-pay

## 2023-06-10 DIAGNOSIS — N390 Urinary tract infection, site not specified: Secondary | ICD-10-CM | POA: Diagnosis not present

## 2023-06-10 DIAGNOSIS — N319 Neuromuscular dysfunction of bladder, unspecified: Secondary | ICD-10-CM | POA: Diagnosis not present

## 2023-06-10 DIAGNOSIS — N3289 Other specified disorders of bladder: Secondary | ICD-10-CM | POA: Diagnosis not present

## 2023-06-10 NOTE — Telephone Encounter (Signed)
Copied from CRM [id number]. Topic: General - Inquiry >> Jun 10, 2023 12:57 PM Justin [name] wrote: Reason for CRM: pt would like to know his lab culture results.  Please call the results have not been released yet.

## 2023-06-11 ENCOUNTER — Encounter (INDEPENDENT_AMBULATORY_CARE_PROVIDER_SITE_OTHER): Payer: Medicare HMO | Admitting: Family Medicine

## 2023-06-11 DIAGNOSIS — N390 Urinary tract infection, site not specified: Secondary | ICD-10-CM | POA: Diagnosis not present

## 2023-06-11 LAB — URINE CULTURE

## 2023-06-11 LAB — URINALYSIS, ROUTINE W REFLEX MICROSCOPIC
Bilirubin, UA: NEGATIVE
Glucose, UA: NEGATIVE
Ketones, UA: NEGATIVE
Nitrite, UA: POSITIVE — AB
RBC, UA: NEGATIVE
Specific Gravity, UA: 1.022 (ref 1.005–1.030)
Urobilinogen, Ur: 0.2 mg/dL (ref 0.2–1.0)
pH, UA: 6 (ref 5.0–7.5)

## 2023-06-11 LAB — MICROSCOPIC EXAMINATION
Casts: NONE SEEN /lpf
Epithelial Cells (non renal): NONE SEEN /hpf (ref 0–10)
RBC, Urine: NONE SEEN /hpf (ref 0–2)
WBC, UA: >30 /HPF — AB (ref 0–5)

## 2023-06-12 MED ORDER — SULFAMETHOXAZOLE-TRIMETHOPRIM 800-160 MG PO TABS
2.0000 | ORAL_TABLET | Freq: Two times a day (BID) | ORAL | 0 refills | Status: AC
Start: 2023-06-12 — End: 2023-06-19

## 2023-06-12 NOTE — Telephone Encounter (Signed)
Patient called for results- notified culture results were positive for UTI and medication has been sent to pharmacy.

## 2023-06-13 ENCOUNTER — Other Ambulatory Visit: Payer: Self-pay | Admitting: Physician Assistant

## 2023-06-13 NOTE — Progress Notes (Signed)
Rx was sent by PCP.

## 2023-06-17 DIAGNOSIS — G8222 Paraplegia, incomplete: Secondary | ICD-10-CM | POA: Diagnosis not present

## 2023-06-24 ENCOUNTER — Other Ambulatory Visit: Payer: Self-pay | Admitting: Family Medicine

## 2023-06-24 DIAGNOSIS — G894 Chronic pain syndrome: Secondary | ICD-10-CM

## 2023-06-27 ENCOUNTER — Telehealth: Payer: Self-pay | Admitting: Family Medicine

## 2023-06-27 ENCOUNTER — Other Ambulatory Visit: Payer: Self-pay | Admitting: Family Medicine

## 2023-06-27 DIAGNOSIS — G894 Chronic pain syndrome: Secondary | ICD-10-CM

## 2023-06-27 NOTE — Telephone Encounter (Signed)
Received a fax from covermymeds for oxycodone HCI 15mg   Key: [id number]

## 2023-06-27 NOTE — Telephone Encounter (Signed)
Medication Refill - Medication: oxyCODONE (ROXICODONE) 15 MG immediate release tablet [[id number]]   Has the patient contacted their pharmacy? Yes.     (Agent: If yes, when and what did the pharmacy advise?) contact pcp   Preferred Pharmacy (with phone number or street name): [facility]   Has the patient been seen for an appointment in the last year OR does the patient have an upcoming appointment? Yes.    Agent: Please be advised that RX refills may take up to 3 business days. We ask that you follow-up with your pharmacy.

## 2023-06-27 NOTE — Telephone Encounter (Signed)
Requested medication (s) are due for refill today: yes  Requested medication (s) are on the active medication list: yes  Last refill:  05/30/23 #150/0  Future visit scheduled: no  Notes to clinic:  Unable to refill per protocol, cannot delegate.      Requested Prescriptions  Pending Prescriptions Disp Refills   oxyCODONE (ROXICODONE) 15 MG immediate release tablet 150 tablet 0    Sig: TAKE ONE TABLET BY MOUTH EVERY 4-5 HOURSAS NEEDED FOR PAIN. NO MORE THAN 5 IN A DAY.     Not Delegated - Analgesics:  Opioid Agonists Failed - 06/27/2023  5:21 PM      Failed - This refill cannot be delegated      Passed - Urine Drug Screen completed in last 360 days      Passed - Valid encounter within last 3 months    Recent Outpatient Visits           1 month ago Paraplegia [facility])   [place] [facility] Malva Limes, MD   4 months ago Tinea cruris   [facility] Malva Limes, MD   4 months ago Recurrent UTI   [facility] Malva Limes, MD   5 months ago Spasticity   [facility] Malva Limes, MD   5 months ago Sepsis due to urinary tract infection [facility])   [place] [facility] Malva Limes, MD

## 2023-06-28 ENCOUNTER — Other Ambulatory Visit: Payer: Self-pay | Admitting: Family Medicine

## 2023-06-28 MED ORDER — OXYCODONE HCL 15 MG PO TABS
ORAL_TABLET | ORAL | 0 refills | Status: DC
Start: 2023-06-28 — End: 2023-08-23

## 2023-06-28 MED ORDER — METHADONE HCL 10 MG PO TABS: 20.0000 mg | ORAL_TABLET | Freq: Four times a day (QID) | ORAL | 0 refills | Status: DC

## 2023-06-28 NOTE — Telephone Encounter (Signed)
PA initiated

## 2023-07-01 ENCOUNTER — Telehealth: Payer: Self-pay | Admitting: Family Medicine

## 2023-07-01 NOTE — Telephone Encounter (Signed)
Received a fax from covermymeds for tizanidine HCI 4mg   Key:  [id number]  Received a fax from covermymeds for Promethazine HCI 25mg   Key: [id number]  Received a fax from covermymeds for alprazolam 2mg   Key: [id number]  Received a fax from covermymeds for sildenafil Citrate 20mg   Key: [id number]

## 2023-07-05 NOTE — Telephone Encounter (Signed)
Did PA's on the following medications: Tizanidine HCI 4 mg, Promethazine HCI 25 mg, Alprazolam 2 mg and received the following message from insurance plan.  "Patient currently has access to the requested medication and a PA is not needed for the patient/medication"  We are waiting on the outcome for the Sildenafil 20 mg.

## 2023-07-09 ENCOUNTER — Encounter: Payer: Medicare HMO | Attending: Physician Assistant | Admitting: Physician Assistant

## 2023-07-09 DIAGNOSIS — L89312 Pressure ulcer of right buttock, stage 2: Secondary | ICD-10-CM | POA: Insufficient documentation

## 2023-07-09 DIAGNOSIS — J45909 Unspecified asthma, uncomplicated: Secondary | ICD-10-CM | POA: Insufficient documentation

## 2023-07-09 DIAGNOSIS — G8222 Paraplegia, incomplete: Secondary | ICD-10-CM | POA: Diagnosis not present

## 2023-07-09 DIAGNOSIS — M1990 Unspecified osteoarthritis, unspecified site: Secondary | ICD-10-CM | POA: Diagnosis not present

## 2023-07-09 DIAGNOSIS — N390 Urinary tract infection, site not specified: Secondary | ICD-10-CM | POA: Diagnosis not present

## 2023-07-09 LAB — POCT URINALYSIS DIPSTICK
Bilirubin, UA: NEGATIVE
Blood, UA: NEGATIVE
Glucose, UA: NEGATIVE
Ketones, UA: NEGATIVE
Leukocytes, UA: NEGATIVE
Nitrite, UA: NEGATIVE
Protein, UA: NEGATIVE
Spec Grav, UA: 1.025 (ref 1.010–1.025)
Urobilinogen, UA: 0.2 U/dL — AB
pH, UA: 6 (ref 5.0–8.0)

## 2023-07-09 NOTE — Progress Notes (Signed)
ESPER, TRUELOCK ([id number]) [phone] [email or web address] Page 1 of 5 Visit Report for 07/09/2023 Abuse Risk Screen Details Patient Name: Date of Service: Justin [name] [facility] N. 07/09/2023 2:00 PM Medical Record Number: [id number] Patient Account Number: [id number] Date of Birth/Sex: Treating RN: [birth year] (42 y.o. Justin [name] Primary Care Kodee Ravert: Mila Merry Other Clinician: Referring Navraj Dreibelbis: Treating Claude Swendsen/Extender: Allen Derry Self, Referral Weeks in Treatment: 0 Abuse Risk Screen Items Answer Electronic Signature(s) Signed: 07/09/2023 3:30:43 PM By: Midge Aver MSN RN CNS WTA Entered By: Midge Aver on 07/09/2023 12:30:42 -------------------------------------------------------------------------------- Activities of Daily Living Details Patient Name: Date of Service: Justin [name] [facility] N. 07/09/2023 2:00 PM Medical Record Number: [id number] Patient Account Number: [id number] Date of Birth/Sex: Treating RN: 12-22-1980 (42 y.o. Justin [name] Primary Care Emilianna Barlowe: Mila Merry Other Clinician: Referring Othal Kubitz: Treating Aracely Rickett/Extender: Allen Derry Self, Referral Weeks in Treatment: 0 Activities of Daily Living Items Answer Activities of Daily Living (Please select one for each item) Drive Automobile Completely Able T Medications ake Completely Able Use T elephone Completely Able Care for Appearance Need Assistance Use T oilet Need Assistance Bath / Shower Need Assistance Dress Self Need Assistance Feed Self Completely Able Walk Not Able Get In / Out Bed Need Assistance Housework Not Able Prepare Meals Need Assistance Handle Money Completely Able Shop for Self Need Assistance Justin [name], Justin [name] ([id number]) [phone] [email or web address] Page 2 of 5 Electronic Signature(s) Signed: 07/09/2023 4:12:41 PM By: Midge Aver MSN RN CNS WTA Entered By: Midge Aver on 07/09/2023  11:35:36 -------------------------------------------------------------------------------- Education Screening Details Patient Name: Date of Service: Justin [name] [facility] N. 07/09/2023 2:00 PM Medical Record Number: [id number] Patient Account Number: [id number] Date of Birth/Sex: Treating RN: 11/23/1980 (42 y.o. Justin [name] Primary Care Miyanna Wiersma: Mila Merry Other Clinician: Referring Beverely Suen: Treating Javarus Dorner/Extender: Allen Derry Self, Referral Weeks in Treatment: 0 Learning Preferences/Education Level/Primary Language Learning Preference: Explanation, Demonstration Preferred Language: English Cognitive Barrier Language Barrier: No Translator Needed: No Memory Deficit: No Emotional Barrier: No Cultural/Religious Beliefs Affecting Medical Care: No Physical Barrier Impaired Vision: No Impaired Hearing: No Decreased Hand dexterity: No Knowledge/Comprehension Knowledge Level: High Comprehension Level: High Ability to understand written instructions: High Ability to understand verbal instructions: High Motivation Anxiety Level: Calm Cooperation: Cooperative Education Importance: Acknowledges Need Interest in Health Problems: Asks Questions Perception: Coherent Willingness to Engage in Self-Management High Activities: Readiness to Engage in Self-Management High Activities: Electronic Signature(s) Signed: 07/09/2023 4:12:41 PM By: Midge Aver MSN RN CNS WTA Entered By: Midge Aver on 07/09/2023 11:36:06 Justin [name], Justin [name] ([id number]) [id number] Page 3 of 5 -------------------------------------------------------------------------------- Fall Risk Assessment Details Patient Name: Date of Service: Justin [name] [facility] N. 07/09/2023 2:00 PM Medical Record Number: [id number] Patient Account Number: [id number] Date of Birth/Sex: Treating RN: [birth year] (42 y.o. Justin [name] Primary Care Lyrical Sowle: Mila Merry Other  Clinician: Referring Marrianne Sica: Treating Coley Kulikowski/Extender: Allen Derry Self, Referral Weeks in Treatment: 0 Fall Risk Assessment Items Have you had 2 or more falls in the last 12 monthso 0 No Have you had any fall that resulted in injury in the last 12 monthso 0 No FALLS RISK SCREEN History of falling - immediate or within 3 months 0 No Secondary diagnosis (Do you have 2 or more medical diagnoseso) 0 No Ambulatory aid None/bed rest/wheelchair/nurse 0 No Crutches/cane/walker 0 No Furniture 0 No Intravenous therapy Access/Saline/Heparin Lock 0 No Gait/Transferring Normal/ bed rest/ wheelchair 0 No Weak (short steps with or without shuffle, stooped but able to lift head while  walking, may seek 0 No support from furniture) Impaired (short steps with shuffle, may have difficulty arising from chair, head down, impaired 0 No balance) Mental Status Oriented to own ability 0 Yes Electronic Signature(s) Signed: 07/09/2023 4:12:41 PM By: Midge Aver MSN RN CNS WTA Entered By: Midge Aver on 07/09/2023 11:36:22 -------------------------------------------------------------------------------- Foot Assessment Details Patient Name: Date of Service: Justin [name] [name] 07/09/2023 2:00 PM Medical Record Number: [id number] Patient Account Number: [id number] Date of Birth/Sex: Treating RN: [birth year] (42 y.o. Justin [name] Primary Care Danh Bayus: Mila Merry Other Clinician: Referring Honesti Seaberg: Treating Jaelyn Cloninger/Extender: Allen Derry Self, Referral Weeks in Treatment: 0 Foot Assessment Items [address] [place], [place] ([id number]) [phone] [email or web address] Page 4 of 5 + = Sensation present, - = Sensation absent, C = Callus, U = Ulcer R = Redness, W = Warmth, M = Maceration, PU = Pre-ulcerative lesion F = Fissure, S = Swelling, D = Dryness Assessment Right: Left: Other Deformity: No No Prior Foot Ulcer: No No Prior Amputation: No No Charcot Joint: No  No Ambulatory Status: Gait: Electronic Signature(s) Signed: 07/09/2023 4:12:41 PM By: Midge Aver MSN RN CNS WTA Entered By: Midge Aver on 07/09/2023 11:36:37 -------------------------------------------------------------------------------- Nutrition Risk Screening Details Patient Name: Date of Service: Justin [name] [facility] N. 07/09/2023 2:00 PM Medical Record Number: [id number] Patient Account Number: [id number] Date of Birth/Sex: Treating RN: [birth year] (42 y.o. Justin [name] Primary Care Olia Hinderliter: Mila Merry Other Clinician: Referring Karys Meckley: Treating Dorlene Footman/Extender: Allen Derry Self, Referral Weeks in Treatment: 0 Height (in): Weight (lbs): Body Mass Index (BMI): Nutrition Risk Screening Items Score Screening NUTRITION RISK SCREEN: I have an illness or condition that made me change the kind and/or amount of food I eat 0 No I eat fewer than two meals per day 0 No I eat few fruits and vegetables, or milk products 0 No I have three or more drinks of beer, liquor or wine almost every day 0 No I have tooth or mouth problems that make it hard for me to eat 0 No I don't always have enough money to buy the food I need 0 No Justin [name], Justin [name] ([id number]) [phone] [email or web address] Page 5 of 5 I eat alone most of the time 0 No I take three or more different prescribed or over-the-counter drugs a day 1 Yes Without wanting to, I have lost or gained 10 pounds in the last six months 0 No I am not always physically able to shop, cook and/or feed myself 0 No Nutrition Protocols Good Risk Protocol 0 No interventions needed Moderate Risk Protocol High Risk Proctocol Risk Level: Good Risk Score: 1 Electronic Signature(s) Signed: 07/09/2023 4:12:41 PM By: Midge Aver MSN RN CNS WTA Entered By: Midge Aver on 07/09/2023 11:36:32

## 2023-07-09 NOTE — Telephone Encounter (Signed)
Patient was advised and stated will stop by the office today.

## 2023-07-09 NOTE — Telephone Encounter (Signed)
Ok, for patient to bring in urine sample for u/a and culture due to paraplegic with chronic UTIs.

## 2023-07-09 NOTE — Progress Notes (Signed)
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 1 Physician Procedures : CPT4 Code Description Modifier [id number] - WC PHYS LEVEL 4 - EST PT 25 ICD-10 Diagnosis Description G82.22 Paraplegia, incomplete L89.312 Pressure ulcer of right buttock, stage 2 Quantity: 1 : [id number] - WC PHYS DEBR WO ANESTH 20 SQ CM ICD-10 Diagnosis Description L89.312 Pressure ulcer of right buttock, stage 2 Quantity: 1 Electronic Signature(s) Signed: 07/09/2023 3:55:25 PM By: Justin Derry PA-C Entered By: Justin [name] on 07/09/2023 15:55:25

## 2023-07-09 NOTE — Addendum Note (Signed)
Addended by: Marjie Skiff on: 07/09/2023 03:45 PM   Modules accepted: Orders

## 2023-07-09 NOTE — Progress Notes (Signed)
JAMICHEAL, GLUNZ ([id number]) [id number] Page 1 of 9 Visit Report for 07/09/2023 Allergy List Details Patient Name: Date of Service: Justin [name] [facility] N. 07/09/2023 2:00 PM Medical Record Number: [id number] Patient Account Number: [id number] Date of Birth/Sex: Treating RN: 16-May-1981 (42 y.o. Justin [name] Primary Care Mikeria Valin: Mila Merry Other Clinician: Referring Otisha Spickler: Treating Jazia Faraci/Extender: Allen Derry Self, Referral Weeks in Treatment: 0 Allergies Active Allergies azithromycin morphine Ativan Demerol meperidine Allergy Notes Electronic Signature(s) Signed: 07/09/2023 4:12:41 PM By: Midge Aver MSN RN CNS WTA Entered By: Midge Aver on 07/09/2023 11:34:38 -------------------------------------------------------------------------------- Arrival Information Details Patient Name: Date of Service: Justin [name] [name] 07/09/2023 2:00 PM Medical Record Number: [id number] Patient Account Number: [id number] Date of Birth/Sex: Treating RN: 03-Nov-1980 (42 y.o. Justin [name] Primary Care Giavanni Odonovan: Mila Merry Other Clinician: Referring Arien Morine: Treating Jakye Mullens/Extender: Allen Derry Self, Referral Weeks in Treatment: 0 Visit Information Patient Arrived: Wheel Chair Arrival Time: 14:26 Accompanied By: wife Transfer Assistance: EasyPivot Patient Lift Patient Identification Verified: No Secondary Verification Process Completed: No Justin [name], Justin [name] ([id number]) Patient Requires Transmission-Based Precautions: No Patient Has Alerts: No History Since Last Visit Added or deleted any medications: No Any new allergies or adverse reactions: No Has Dressing in Place as Prescribed: Yes Pain Present Now: No [phone].pdf Page 2 of 9 Electronic Signature(s) Signed: 07/09/2023 4:12:41 PM By: Midge Aver MSN RN CNS WTA Entered By: Midge Aver on 07/09/2023  11:32:06 -------------------------------------------------------------------------------- Clinic Level of Care Assessment Details Patient Name: Date of Service: Justin [name] [facility] N. 07/09/2023 2:00 PM Medical Record Number: [id number] Patient Account Number: [id number] Date of Birth/Sex: Treating RN: 1981-08-22 (42 y.o. Justin [name] Primary Care Anuel Sitter: Mila Merry Other Clinician: Referring Karleigh Bunte: Treating Malka Bocek/Extender: Allen Derry Self, Referral Weeks in Treatment: 0 Clinic Level of Care Assessment Items TOOL 1 Quantity Score X- 1 0 Use when EandM and Procedure is performed on INITIAL visit ASSESSMENTS - Nursing Assessment / Reassessment X- 1 20 General Physical Exam (combine w/ comprehensive assessment (listed just below) when performed on new pt. evals) X- 1 25 Comprehensive Assessment (HX, ROS, Risk Assessments, Wounds Hx, etc.) ASSESSMENTS - Wound and Skin Assessment / Reassessment []  - 0 Dermatologic / Skin Assessment (not related to wound area) ASSESSMENTS - Ostomy and/or Continence Assessment and Care []  - 0 Incontinence Assessment and Management []  - 0 Ostomy Care Assessment and Management (repouching, etc.) PROCESS - Coordination of Care X - Simple Patient / Family Education for ongoing care 1 15 []  - 0 Complex (extensive) Patient / Family Education for ongoing care X- 1 10 Staff obtains Chiropractor, Records, T Results / Process Orders est []  - 0 Staff telephones HHA, Nursing Homes / Clarify orders / etc []  - 0 Routine Transfer to another Facility (non-emergent condition) []  - 0 Routine Hospital Admission (non-emergent condition) X- 1 15 New Admissions / Manufacturing engineer / Ordering NPWT Apligraf, etc. , []  - 0 Emergency Hospital Admission (emergent condition) PROCESS - Special Needs []  - 0 Pediatric / Minor Patient Management []  - 0 Isolation Patient Management []  - 0 Hearing / Language / Visual special needs []  - 0 Assessment of  Community assistance (transportation, D/C planning, etc.) []  - 0 Additional assistance / Altered mentation []  - 0 Support Surface(s) Assessment (bed, cushion, seat, etc.) INTERVENTIONS - Miscellaneous Justin [name], Justin [name] N ([id number]) [id number] Page 3 of 9 []  - 0 External ear exam []  - 0 Patient Transfer (multiple staff / Nurse, adult / Similar devices) []  - 0 Simple Staple /  Suture removal (25 or less) []  - 0 Complex Staple / Suture removal (26 or more) []  - 0 Hypo/Hyperglycemic Management (do not check if billed separately) []  - 0 Ankle / Brachial Index (ABI) - do not check if billed separately Has the patient been seen at the hospital within the last three years: Yes Total Score: 85 Level Of Care: New/Established - Level 3 Electronic Signature(s) Signed: 07/09/2023 4:12:41 PM By: Midge Aver MSN RN CNS WTA Entered By: Midge Aver on 07/09/2023 12:20:45 -------------------------------------------------------------------------------- Encounter Discharge Information Details Patient Name: Date of Service: Justin [name] [name] 07/09/2023 2:00 PM Medical Record Number: [id number] Patient Account Number: [id number] Date of Birth/Sex: Treating RN: 08/22/1981 (42 y.o. Justin [name] Primary Care Maryanna Stuber: Mila Merry Other Clinician: Referring Saharah Sherrow: Treating Faith Branan/Extender: Allen Derry Self, Referral Weeks in Treatment: 0 Encounter Discharge Information Items Post Procedure Vitals Discharge Condition: Stable Temperature (F): 98.0 Ambulatory Status: Wheelchair Pulse (bpm): 75 Discharge Destination: Home Respiratory Rate (breaths/min): 18 Transportation: Private Auto Blood Pressure (mmHg): 102/66 Accompanied By: wife Schedule Follow-up Appointment: Yes Clinical Summary of Care: Electronic Signature(s) Signed: 07/09/2023 3:37:13 PM By: Midge Aver MSN RN CNS WTA Entered By: Midge Aver on 07/09/2023  12:37:12 -------------------------------------------------------------------------------- Lower Extremity Assessment Details Patient Name: Date of Service: Justin [name] [facility] N. 07/09/2023 2:00 PM Medical Record Number: [id number] Patient Account Number: [id number] Date of Birth/Sex: Treating RN: August 06, 1981 (42 y.o. Justin [name] Primary Care Glendon Dunwoody: Mila Merry Other Clinician: Referring Lauri Purdum: Treating Laykin Rainone/Extender: Allen Derry Self, Referral Justin [name], Justin [name] ([id number]) [id number] Page 4 of 9 Weeks in Treatment: 0 Electronic Signature(s) Signed: 07/09/2023 4:12:41 PM By: Midge Aver MSN RN CNS WTA Entered By: Midge Aver on 07/09/2023 11:46:20 -------------------------------------------------------------------------------- Multi Wound Chart Details Patient Name: Date of Service: Justin [name] [facility] N. 07/09/2023 2:00 PM Medical Record Number: [id number] Patient Account Number: [id number] Date of Birth/Sex: Treating RN: 25-Mar-1981 (42 y.o. Justin [name] Primary Care Taquila Leys: Mila Merry Other Clinician: Referring Pheobe Sandiford: Treating Zarina Pe/Extender: Allen Derry Self, Referral Weeks in Treatment: 0 Vital Signs Height(in): Pulse(bpm): 75 Weight(lbs): Blood Pressure(mmHg): 102/66 Body Mass Index(BMI): Temperature(F): 98.0 Respiratory Rate(breaths/min): 18 [6:Photos:] [N/A:N/A] Right Gluteus N/A N/A Wound Location: Gradually Appeared N/A N/A Wounding Event: Pressure Ulcer N/A N/A Primary Etiology: Asthma, Pneumothorax, Arrhythmia, N/A N/A Comorbid History: History of pressure wounds, Osteoarthritis, Paraplegia 07/09/2023 N/A N/A Date Acquired: 0 N/A N/A Weeks of Treatment: Open N/A N/A Wound Status: No N/A N/A Wound Recurrence: 0.5x0.3x0.1 N/A N/A Measurements L x W x D (cm) 0.118 N/A N/A A (cm) : rea 0.012 N/A N/A Volume (cm) : Category/Stage II N/A N/A Classification: None Present N/A N/A Exudate A  mount: Medium (34-66%) N/A N/A Granulation A mount: Pale N/A N/A Granulation Quality: None Present (0%) N/A N/A Necrotic A mount: Fascia: No N/A N/A Exposed Structures: Fat Layer (Subcutaneous Tissue): No Tendon: No Muscle: No Joint: No Bone: No Small (1-33%) N/A N/A Epithelialization: Debridement - Selective/Open Wound N/A N/A Debridement: Pre-procedure Verification/Time Out 14:55 N/A N/A Taken: Skin/Epidermis N/A N/A Level: 0.12 N/A N/A Debridement A (sq cm): rea Curette N/A N/A InstrumentRAJENDER, Justin [name] ([id number]) [id number] Page 5 of 9 Minimum N/A N/A Bleeding: Pressure N/A N/A Hemostasis Achieved: 0 N/A N/A Procedural Pain: 0 N/A N/A Post Procedural Pain: Procedure was tolerated well N/A N/A Debridement Treatment Response: 0.5x0.3x0.1 N/A N/A Post Debridement Measurements L x W x D (cm) 0.012 N/A N/A Post Debridement Volume: (cm) Category/Stage II N/A N/A Post Debridement Stage: Debridement N/A N/A Procedures  Performed: Treatment Notes Electronic Signature(s) Signed: 07/09/2023 3:30:56 PM By: Midge Aver MSN RN CNS WTA Entered By: Midge Aver on 07/09/2023 12:30:56 -------------------------------------------------------------------------------- Multi-Disciplinary Care Plan Details Patient Name: Date of Service: Justin [name] [facility] N. 07/09/2023 2:00 PM Medical Record Number: [id number] Patient Account Number: [id number] Date of Birth/Sex: Treating RN: Nov 02, 1980 (42 y.o. Justin [name] Primary Care Ryman Rathgeber: Mila Merry Other Clinician: Referring Kourtland Coopman: Treating Gregoria Selvy/Extender: Allen Derry Self, Referral Weeks in Treatment: 0 Active Inactive Pressure Nursing Diagnoses: Knowledge deficit related to causes and risk factors for pressure ulcer development Knowledge deficit related to management of pressures ulcers Potential for impaired tissue integrity related to pressure, friction, moisture, and  shear Goals: Patient will remain free from development of additional pressure ulcers Date Initiated: 07/09/2023 Target Resolution Date: 08/09/2023 Goal Status: Active Patient will remain free of pressure ulcers Date Initiated: 07/09/2023 Target Resolution Date: 08/09/2023 Goal Status: Active Patient/caregiver will verbalize risk factors for pressure ulcer development Date Initiated: 07/09/2023 Target Resolution Date: 08/09/2023 Goal Status: Active Patient/caregiver will verbalize understanding of pressure ulcer management Date Initiated: 07/09/2023 Target Resolution Date: 08/09/2023 Goal Status: Active Interventions: Assess: immobility, friction, shearing, incontinence upon admission and as needed Assess offloading mechanisms upon admission and as needed Assess potential for pressure ulcer upon admission and as needed Provide education on pressure ulcers Notes: Justin [name], Justin [name] ([id number]) [phone].pdf Page 6 of 9 Nursing Diagnoses: Impaired tissue integrity Knowledge deficit related to ulceration/compromised skin integrity Goals: Patient/caregiver will verbalize understanding of skin care regimen Date Initiated: 07/09/2023 Target Resolution Date: 07/23/2023 Goal Status: Active Ulcer/skin breakdown will have a volume reduction of 30% by week 4 Date Initiated: 07/09/2023 Target Resolution Date: 08/09/2023 Goal Status: Active Ulcer/skin breakdown will have a volume reduction of 50% by week 8 Date Initiated: 07/09/2023 Target Resolution Date: 09/08/2023 Goal Status: Active Interventions: Assess patient/caregiver ability to obtain necessary supplies Assess patient/caregiver ability to perform ulcer/skin care regimen upon admission and as needed Assess ulceration(s) every visit Provide education on ulcer and skin care Treatment Activities: Skin care regimen initiated : 07/09/2023 Notes: Electronic Signature(s) Signed:  07/09/2023 3:35:24 PM By: Midge Aver MSN RN CNS WTA Previous Signature: 07/09/2023 3:35:18 PM Version By: Midge Aver MSN RN CNS WTA Entered By: Midge Aver on 07/09/2023 12:35:24 -------------------------------------------------------------------------------- Pain Assessment Details Patient Name: Date of Service: Justin [name] [name] 07/09/2023 2:00 PM Medical Record Number: [id number] Patient Account Number: [id number] Date of Birth/Sex: Treating RN: 23-Sep-1980 (42 y.o. Justin [name] Primary Care Ngozi Alvidrez: Mila Merry Other Clinician: Referring Khalilah Hoke: Treating Roizy Harold/Extender: Allen Derry Self, Referral Weeks in Treatment: 0 Active Problems Location of Pain Severity and Description of Pain Patient Has Paino No Site Locations [place], [place] ([id number]) [id number].pdf Page 7 of 9 Pain Management and Medication Current Pain Management: Electronic Signature(s) Signed: 07/09/2023 4:12:41 PM By: Midge Aver MSN RN CNS WTA Entered By: Midge Aver on 07/09/2023 11:32:16 -------------------------------------------------------------------------------- Patient/Caregiver Education Details Patient Name: Date of Service: Justin Justin [name] 10/22/2024andnbsp2:00 PM Medical Record Number: [id number] Patient Account Number: [id number] Date of Birth/Gender: Treating RN: 1981-01-19 (42 y.o. Justin [name] Primary Care Physician: Mila Merry Other Clinician: Referring Physician: Treating Physician/Extender: Allen Derry Self, Referral Weeks in Treatment: 0 Education Assessment Education Provided To: Patient Education Topics Provided Wound/Skin Impairment: Handouts: Caring for Your Ulcer Methods: Explain/Verbal Responses: State content correctly Electronic Signature(s) Signed: 07/09/2023 4:12:41 PM By: Midge Aver MSN RN CNS WTA Entered By: Midge Aver on 07/09/2023  12:36:00 -------------------------------------------------------------------------------- Wound Assessment Details Patient  Name: Date of Service: Justin [name] [facility] N. 07/09/2023 2:00 PM Medical Record Number: [id number] Patient Account Number: [id number] Date of Birth/Sex: Treating RN: Jan 28, 1981 (42 y.o. Justin [name] Primary Care Taquisha Phung: Mila Merry Other Clinician: Referring Taejah Ohalloran: Treating Ceniyah Thorp/Extender: Allen Derry Self, Referral Weeks in Treatment: 0 Wound Status Wound Number: 6 Primary Pressure Ulcer Etiology: Justin [name], Justin [name] ([id number]) [phone].pdf Page 8 of 9 Etiology: Wound Location: Right Gluteus Wound Open Wounding Event: Gradually Appeared Status: Date Acquired: 07/09/2023 Comorbid Asthma, Pneumothorax, Arrhythmia, History of pressure Weeks Of Treatment: 0 History: wounds, Osteoarthritis, Paraplegia Clustered Wound: No Photos Wound Measurements Length: (cm) 0.5 Width: (cm) 0.3 Depth: (cm) 0.1 Area: (cm) 0.118 Volume: (cm) 0.012 % Reduction in Area: % Reduction in Volume: Epithelialization: Small (1-33%) Wound Description Classification: Category/Stage II Exudate Amount: None Present Foul Odor After Cleansing: No Slough/Fibrino No Wound Bed Granulation Amount: Medium (34-66%) Exposed Structure Granulation Quality: Pale Fascia Exposed: No Necrotic Amount: None Present (0%) Fat Layer (Subcutaneous Tissue) Exposed: No Tendon Exposed: No Muscle Exposed: No Joint Exposed: No Bone Exposed: No Treatment Notes Wound #6 (Gluteus) Wound Laterality: Right Cleanser Soap and Water Discharge Instruction: Gently cleanse wound with antibacterial soap, rinse and pat dry prior to dressing wounds Vashe 5.8 (oz) Discharge Instruction: Use vashe 5.8 (oz) as directed Peri-Wound Care Topical Primary Dressing Xeroform-HBD 2x2 (in/in) Discharge Instruction: Apply Xeroform-HBD 2x2 (in/in) as directed Secondary  Dressing Coverlet Latex-Free Fabric Adhesive Dressings Discharge Instruction: 2 x 3 3/4 Secured With Compression Wrap Compression Stockings Add-Ons Electronic Signature(s) Signed: 07/09/2023 4:12:41 PM By: Midge Aver MSN RN CNS WTA Entered By: Midge Aver on 07/09/2023 12:02:29 Justin [name] ([id number]) [id number] Page 9 of 9 -------------------------------------------------------------------------------- Vitals Details Patient Name: Date of Service: Justin [name] [facility] N. 07/09/2023 2:00 PM Medical Record Number: [id number] Patient Account Number: [id number] Date of Birth/Sex: Treating RN: [birth year] (42 y.o. Justin [name] Primary Care Irja Wheless: Mila Merry Other Clinician: Referring Ikenna Ohms: Treating Lilianna Case/Extender: Allen Derry Self, Referral Weeks in Treatment: 0 Vital Signs Time Taken: 14:32 Temperature (F): 98.0 Pulse (bpm): 75 Respiratory Rate (breaths/min): 18 Blood Pressure (mmHg): 102/66 Reference Range: 80 - 120 mg / dl Electronic Signature(s) Signed: 07/09/2023 4:12:41 PM By: Midge Aver MSN RN CNS WTA Entered By: Midge Aver on 07/09/2023 11:34:25

## 2023-07-11 ENCOUNTER — Telehealth: Payer: Self-pay

## 2023-07-11 NOTE — Telephone Encounter (Signed)
Copied from CRM [id number]. Topic: General - Other >> Jul 11, 2023 11:19 AM Macon Large wrote: Reason for CRM: Pt called for most recent lab results. Pt wanted to make Dr. [name] aware that he started taking the cefdinir 500 MG capsule. Cb# [phone]

## 2023-07-12 ENCOUNTER — Other Ambulatory Visit: Payer: Self-pay | Admitting: Family Medicine

## 2023-07-12 ENCOUNTER — Encounter: Payer: Self-pay | Admitting: Family Medicine

## 2023-07-12 DIAGNOSIS — N390 Urinary tract infection, site not specified: Secondary | ICD-10-CM

## 2023-07-12 LAB — URINE CULTURE

## 2023-07-12 MED ORDER — CEFDINIR 300 MG PO CAPS
ORAL_CAPSULE | ORAL | 0 refills | Status: DC
Start: 2023-07-12 — End: 2023-07-20

## 2023-07-12 NOTE — Telephone Encounter (Signed)
No sign of infection on the u/a. He can't top the cefdinir.

## 2023-07-12 NOTE — Telephone Encounter (Signed)
Results have been relayed to the patient/authorized caretaker. The patient/authorized caretaker verbalized understanding. No questions at this time.   

## 2023-07-16 ENCOUNTER — Encounter: Payer: Medicare HMO | Admitting: Physician Assistant

## 2023-07-16 DIAGNOSIS — M1990 Unspecified osteoarthritis, unspecified site: Secondary | ICD-10-CM | POA: Diagnosis not present

## 2023-07-16 DIAGNOSIS — L89312 Pressure ulcer of right buttock, stage 2: Secondary | ICD-10-CM | POA: Diagnosis not present

## 2023-07-16 DIAGNOSIS — G8222 Paraplegia, incomplete: Secondary | ICD-10-CM | POA: Diagnosis not present

## 2023-07-16 DIAGNOSIS — J45909 Unspecified asthma, uncomplicated: Secondary | ICD-10-CM | POA: Diagnosis not present

## 2023-07-17 NOTE — Progress Notes (Signed)
Justin [name] ([id number]) [id number] Page 1 of 6 Visit Report for 07/16/2023 Chief Complaint Document Details Patient Name: Date of Service: Justin [name] [facility] N. 07/16/2023 3:30 PM Medical Record Number: [id number] Patient Account Number: [id number] Date of Birth/Sex: Treating RN: [birth year] (42 y.o. [name] Primary Care Provider: Mila Merry Other Clinician: Referring Provider: Treating Provider/Extender: Reinaldo Raddle in Treatment: 1 Information Obtained from: Patient Chief Complaint Sacral/gluteal pressure ulcer Electronic Signature(s) Signed: 07/16/2023 4:18:59 PM By: Allen Derry PA-C Entered By: Allen Derry on 07/16/2023 16:18:59 -------------------------------------------------------------------------------- HPI Details Patient Name: Date of Service: Justin [name] Justin N. 07/16/2023 3:30 PM Medical Record Number: [id number] Patient Account Number: [id number] Date of Birth/Sex: Treating RN: 06-Jul-1981 (42 y.o. [name] Primary Care Provider: Mila Merry Other Clinician: Referring Provider: Treating Provider/Extender: Reinaldo Raddle in Treatment: 1 History of Present Illness HPI Description: 06/06/2021 upon evaluation today patient presents for repeat evaluation here in the clinic although its actually been a number of years since he was seen here. This was actually in 2016. He saw Dr. Lawerance Bach at that time. Nonetheless the patient tells me that based on where things stand currently he is done fairly well he has intermittently had issues with the gluteal region but nothing sustained like what he has right now the just does not seem to want to heal. He has been using Silvadene and cleaning with peroxide. Fortunately there does not appear to be any signs of infection which is great news and this is fairly superficial. The good news is from a healing standpoint this should actually probably  heal fairly quickly the bad news is with him being a paraplegic even incomplete he has some movement and he has excellent upper body strength but nonetheless he still can have to be very careful about pressure and appropriate offloading and this was the majority of the conversation we had today. Readmission: 07-09-2023 upon evaluation today patient appears to be doing decently well overall. This is a gentleman that I have seen several times over the past couple of years since 2022. With that being said he typically takes excellent care of himself and therefore wounds are not that frequent for him which is excellent news. I do not see any signs of active infection at this time which is good news he does have a small area which is mainly crust and scab covered at this point and that is something that we are trying to help to improve. He has been using some Silvadene cream here in the area which in the past we have discussed as well but he does tend to feel like that does well for him. Nonetheless right now he tells me just been using a Band-Aid over it when it was open and just to make sure that nothing got rubbed he is continued that Band-Aid without any actual dressing underneath. Justin [name] ([id number]) [id number] Page 2 of 6 Patient's past medical history really has not changed is fairly healthy beyond what he has going on with the wounds. 07-16-2023 upon evaluation today patient appears to be doing well currently in regard to the wound which is actually showing signs of improvement. I am actually very pleased with where things stand today. I do not see any signs of active infection locally or systemically at this time. Electronic Signature(s) Signed: 07/16/2023 4:41:18 PM By: Allen Derry PA-C Entered By: Allen Derry on 07/16/2023 16:41:18 -------------------------------------------------------------------------------- Physical Exam Details Patient Name: Date  of Service: Justin [name] [facility] N. 07/16/2023 3:30 PM Medical Record Number: [id number] Patient Account Number: [id number] Date of Birth/Sex: Treating RN: 09-23-1980 (42 y.o. [name] Primary Care Provider: Mila Merry Other Clinician: Referring Provider: Treating Provider/Extender: Reinaldo Raddle in Treatment: 1 Constitutional Well-nourished and well-hydrated in no acute distress. Respiratory normal breathing without difficulty. Psychiatric this patient is able to make decisions and demonstrates good insight into disease process. Alert and Oriented x 3. pleasant and cooperative. Notes Upon inspection patient's wound pretty much appears to be healed. With that being said I do not see any signs of infection at this time which is good news and in general I do believe that he is doing pretty well. I think that it may be best for [place] just to use a topical treatment as opposed to a dressing as it is pulling on his skin a bit. Electronic Signature(s) Signed: 07/16/2023 4:42:28 PM By: Allen Derry PA-C Entered By: Allen Derry on 07/16/2023 16:42:28 -------------------------------------------------------------------------------- Physician Orders Details Patient Name: Date of Service: Justin [name] [facility] N. 07/16/2023 3:30 PM Medical Record Number: [id number] Patient Account Number: [id number] Date of Birth/Sex: Treating RN: May 09, 1981 (42 y.o. [name] Primary Care Provider: Mila Merry Other Clinician: Referring Provider: Treating Provider/Extender: Reinaldo Raddle in Treatment: 1 The following information was scribed by: Midge Aver The information was scribed for: Allen Derry Verbal / Phone Orders: No Justin [name], Justin [name] ([id number]) [id number] Page 3 of 6 Diagnosis Coding Follow-up Appointments Return Appointment in 1 week. Bathing/ Shower/ Hygiene Clean wound with Normal Saline or wound  cleanser. Wound Treatment Wound #6 - Gluteus Wound Laterality: Right Cleanser: Soap and Water 1 x Per Day/30 Days Discharge Instructions: Gently cleanse wound with antibacterial soap, rinse and pat dry prior to dressing wounds Cleanser: Vashe 5.8 (oz) 1 x Per Day/30 Days Discharge Instructions: Use vashe 5.8 (oz) as directed Topical: CALMOSEPTINE 1 x Per Day/30 Days Electronic Signature(s) Signed: 07/16/2023 5:08:05 PM By: Midge Aver MSN RN CNS WTA Signed: 07/16/2023 6:10:41 PM By: Allen Derry PA-C Entered By: Midge Aver on 07/16/2023 17:08:05 -------------------------------------------------------------------------------- Problem List Details Patient Name: Date of Service: Gaylyn Rong Lauris [name] Justin N. 07/16/2023 3:30 PM Medical Record Number: [id number] Patient Account Number: [id number] Date of Birth/Sex: Treating RN: 1981-02-07 (42 y.o. [name] Primary Care Provider: Mila Merry Other Clinician: Referring Provider: Treating Provider/Extender: Reinaldo Raddle in Treatment: 1 Active Problems ICD-10 Encounter Code Description Active Date MDM Diagnosis G82.22 Paraplegia, incomplete 07/09/2023 No Yes L89.312 Pressure ulcer of right buttock, stage 2 07/09/2023 No Yes Inactive Problems Resolved Problems Electronic Signature(s) Signed: 07/16/2023 4:18:55 PM By: Allen Derry PA-C Entered By: Allen Derry on 07/16/2023 16:18:55 Jannifer Rodney ([id number]) [id number] Page 4 of 6 -------------------------------------------------------------------------------- Progress Note Details Patient Name: Date of Service: Justin [name] [facility] N. 07/16/2023 3:30 PM Medical Record Number: [id number] Patient Account Number: [id number] Date of Birth/Sex: Treating RN: 1981/06/02 (42 y.o. [name] Primary Care Provider: Mila Merry Other Clinician: Referring Provider: Treating Provider/Extender: Reinaldo Raddle in  Treatment: 1 Subjective Chief Complaint Information obtained from Patient Sacral/gluteal pressure ulcer History of Present Illness (HPI) 06/06/2021 upon evaluation today patient presents for repeat evaluation here in the clinic although its actually been a number of years since he was seen here. This was actually in 2016. He saw Dr. Lawerance Bach at that time. Nonetheless the patient tells me that based on where things stand currently  he is done fairly well he has intermittently had issues with the gluteal region but nothing sustained like what he has right now the just does not seem to want to heal. He has been using Silvadene and cleaning with peroxide. Fortunately there does not appear to be any signs of infection which is great news and this is fairly superficial. The good news is from a healing standpoint this should actually probably heal fairly quickly the bad news is with him being a paraplegic even incomplete he has some movement and he has excellent upper body strength but nonetheless he still can have to be very careful about pressure and appropriate offloading and this was the majority of the conversation we had today. Readmission: 07-09-2023 upon evaluation today patient appears to be doing decently well overall. This is a gentleman that I have seen several times over the past couple of years since 2022. With that being said he typically takes excellent care of himself and therefore wounds are not that frequent for him which is excellent news. I do not see any signs of active infection at this time which is good news he does have a small area which is mainly crust and scab covered at this point and that is something that we are trying to help to improve. He has been using some Silvadene cream here in the area which in the past we have discussed as well but he does tend to feel like that does well for him. Nonetheless right now he tells me just been using a Band-Aid over it when it was open and  just to make sure that nothing got rubbed he is continued that Band-Aid without any actual dressing underneath. Patient's past medical history really has not changed is fairly healthy beyond what he has going on with the wounds. 07-16-2023 upon evaluation today patient appears to be doing well currently in regard to the wound which is actually showing signs of improvement. I am actually very pleased with where things stand today. I do not see any signs of active infection locally or systemically at this time. Objective Constitutional Well-nourished and well-hydrated in no acute distress. Vitals Time Taken: 4:03 PM, Temperature: 98.1 F, Pulse: 88 bpm, Respiratory Rate: 18 breaths/min, Blood Pressure: 127/81 mmHg. Respiratory normal breathing without difficulty. Psychiatric this patient is able to make decisions and demonstrates good insight into disease process. Alert and Oriented x 3. pleasant and cooperative. General Notes: Upon inspection patient's wound pretty much appears to be healed. With that being said I do not see any signs of infection at this time which is good news and in general I do believe that he is doing pretty well. I think that it may be best for [place] just to use a topical treatment as opposed to a dressing as it is pulling on his skin a bit. Integumentary (Hair, Skin) Wound #6 status is Open. Original cause of wound was Gradually Appeared. The date acquired was: 07/09/2023. The wound has been in treatment 1 weeks. The wound is located on the Right Gluteus. The wound measures 0.4cm length x 0.2cm width x 0.1cm depth; 0.063cm^2 area and 0.006cm^3 volume. There is a none present amount of drainage noted. There is medium (34-66%) pale granulation within the wound bed. There is no necrotic tissue within the wound bed. Justin [name], Justin [name] ([id number]) [id number] Page 5 of 6 Assessment Active Problems ICD-10 Paraplegia, incomplete Pressure ulcer of right  buttock, stage 2 Plan Follow-up Appointments: Return Appointment in 1 week. Bathing/  Shower/ Hygiene: Clean wound with Normal Saline or wound cleanser. WOUND #6: - Gluteus Wound Laterality: Right Cleanser: Soap and Water 1 x Per Day/30 Days Discharge Instructions: Gently cleanse wound with antibacterial soap, rinse and pat dry prior to dressing wounds Cleanser: Vashe 5.8 (oz) 1 x Per Day/30 Days Discharge Instructions: Use vashe 5.8 (oz) as directed Topical: CALMOSEPTINE 1 x Per Day/30 Days 1. I would recommend that we actually go ahead and utilize a topical treatment such as Calmoseptine I think this may be better. 2. I am going to recommend as well that the patient should continue with the Vashe to clean the area I think this is doing well. 3. I would recommend continued and appropriate offloading he always does extremely well with this. We will see patient back for reevaluation in 1 week here in the clinic. If anything worsens or changes patient will contact our office for additional recommendations. Electronic Signature(s) Signed: 07/16/2023 4:43:06 PM By: Allen Derry PA-C Entered By: Allen Derry on 07/16/2023 16:43:06 -------------------------------------------------------------------------------- SuperBill Details Patient Name: Date of Service: Justin [name] [facility] N. 07/16/2023 Medical Record Number: [id number] Patient Account Number: [id number] Date of Birth/Sex: Treating RN: [birth year] (42 y.o. [name] Primary Care Provider: Mila Merry Other Clinician: Referring Provider: Treating Provider/Extender: Reinaldo Raddle in Treatment: 1 Diagnosis Coding ICD-10 Codes Code Description G82.22 Paraplegia, incomplete L89.312 Pressure ulcer of right buttock, stage 2 Facility Procedures : CPT4 Code: [id number] Description: 13086 - WOUND CARE VISIT-LEV 3 EST PT Modifier: Quantity: 1 Physician Procedures : CPT4 Code Description [address] Justin [name], Justin [name]  ([id number]) [id number] Page 6 of 6 [id number] - WC PHYS LEVEL 3 - EST PT 1 ICD-10 Diagnosis Description G82.22 Paraplegia, incomplete L89.312 Pressure ulcer of right  buttock, stage 2 Quantity: Electronic Signature(s) Signed: 07/16/2023 5:09:14 PM By: Midge Aver MSN RN CNS WTA Signed: 07/16/2023 6:10:41 PM By: Allen Derry PA-C Previous Signature: 07/16/2023 4:43:18 PM Version By: Allen Derry PA-C Entered By: Midge Aver on 07/16/2023 17:09:14

## 2023-07-18 NOTE — Progress Notes (Signed)
BURACH, BRAMSON ([id number]) [id number] Page 1 of 9 Visit Report for 07/16/2023 Arrival Information Details Patient Name: Date of Service: [name] [facility] N. 07/16/2023 3:30 PM Medical Record Number: [id number] Patient Account Number: [id number] Date of Birth/Sex: Treating RN: 1980-11-10 (42 y.o. [name] Primary Care Renda Pohlman: Mila Merry Other Clinician: Referring Hiliary Osorto: Treating Ariday Brinker/Extender: Reinaldo Raddle in Treatment: 1 Visit Information History Since Last Visit Added or deleted any medications: No Patient Arrived: Wheel Chair Any new allergies or adverse reactions: No Arrival Time: 15:58 Has Dressing in Place as Prescribed: Yes Accompanied By: wife Pain Present Now: No Transfer Assistance: EasyPivot Patient Lift Patient Identification Verified: Yes Secondary Verification Process Completed: Yes Patient Requires Transmission-Based Precautions: No Patient Has Alerts: No Electronic Signature(s) Signed: 07/17/2023 5:05:03 PM By: Midge Aver MSN RN CNS WTA Entered By: Midge Aver on 07/16/2023 15:58:56 -------------------------------------------------------------------------------- Clinic Level of Care Assessment Details Patient Name: Date of Service: [name] [facility] N. 07/16/2023 3:30 PM Medical Record Number: [id number] Patient Account Number: [id number] Date of Birth/Sex: Treating RN: 11/08/1980 (42 y.o. [name] Primary Care Tranell Wojtkiewicz: Mila Merry Other Clinician: Referring Jomo Forand: Treating Delshon Blanchfield/Extender: Reinaldo Raddle in Treatment: 1 Clinic Level of Care Assessment Items TOOL 4 Quantity Score X- 1 0 Use when only an EandM is performed on FOLLOW-UP visit ASSESSMENTS - Nursing Assessment / Reassessment X- 1 10 Reassessment of Co-morbidities (includes updates in patient status) X- 1 5 Reassessment of Adherence to Treatment Plan ASSESSMENTS - Wound and Skin A  ssessment / Reassessment X - Simple Wound Assessment / Reassessment - one wound 1 5 []  - 0 Complex Wound Assessment / Reassessment - multiple wounds [name], [name] ([id number]) [id number] Page 2 of 9 []  - 0 Dermatologic / Skin Assessment (not related to wound area) ASSESSMENTS - Focused Assessment []  - 0 Circumferential Edema Measurements - multi extremities []  - 0 Nutritional Assessment / Counseling / Intervention []  - 0 Lower Extremity Assessment (monofilament, tuning fork, pulses) []  - 0 Peripheral Arterial Disease Assessment (using hand held doppler) ASSESSMENTS - Ostomy and/or Continence Assessment and Care []  - 0 Incontinence Assessment and Management []  - 0 Ostomy Care Assessment and Management (repouching, etc.) PROCESS - Coordination of Care X - Simple Patient / Family Education for ongoing care 1 15 []  - 0 Complex (extensive) Patient / Family Education for ongoing care X- 1 10 Staff obtains Chiropractor, Records, T Results / Process Orders est []  - 0 Staff telephones HHA, Nursing Homes / Clarify orders / etc []  - 0 Routine Transfer to another Facility (non-emergent condition) []  - 0 Routine Hospital Admission (non-emergent condition) []  - 0 New Admissions / Manufacturing engineer / Ordering NPWT Apligraf, etc. , []  - 0 Emergency Hospital Admission (emergent condition) X- 1 10 Simple Discharge Coordination []  - 0 Complex (extensive) Discharge Coordination PROCESS - Special Needs []  - 0 Pediatric / Minor Patient Management []  - 0 Isolation Patient Management []  - 0 Hearing / Language / Visual special needs []  - 0 Assessment of Community assistance (transportation, D/C planning, etc.) []  - 0 Additional assistance / Altered mentation []  - 0 Support Surface(s) Assessment (bed, cushion, seat, etc.) INTERVENTIONS - Wound Cleansing / Measurement X - Simple Wound Cleansing - one wound 1 5 []  - 0 Complex Wound Cleansing - multiple  wounds X- 1 5 Wound Imaging (photographs - any number of wounds) []  - 0 Wound Tracing (instead of photographs) X- 1 5 Simple Wound Measurement - one wound []  -  0 Complex Wound Measurement - multiple wounds INTERVENTIONS - Wound Dressings X - Small Wound Dressing one or multiple wounds 1 10 []  - 0 Medium Wound Dressing one or multiple wounds []  - 0 Large Wound Dressing one or multiple wounds X- 1 5 Application of Medications - topical []  - 0 Application of Medications - injection INTERVENTIONS - Miscellaneous []  - 0 External ear exam []  - 0 Specimen Collection (cultures, biopsies, blood, body fluids, etc.) []  - 0 Specimen(s) / Culture(s) sent or taken to Lab for analysis [name], [name] ([id number]) [id number] Page 3 of 9 []  - 0 Patient Transfer (multiple staff / Nurse, adult / Similar devices) []  - 0 Simple Staple / Suture removal (25 or less) []  - 0 Complex Staple / Suture removal (26 or more) []  - 0 Hypo / Hyperglycemic Management (close monitor of Blood Glucose) []  - 0 Ankle / Brachial Index (ABI) - do not check if billed separately X- 1 5 Vital Signs Has the patient been seen at the hospital within the last three years: Yes Total Score: 90 Level Of Care: New/Established - Level 3 Electronic Signature(s) Signed: 07/17/2023 5:05:03 PM By: Midge Aver MSN RN CNS WTA Entered By: Midge Aver on 07/16/2023 17:09:05 -------------------------------------------------------------------------------- Encounter Discharge Information Details Patient Name: Date of Service: [name] [name] 07/16/2023 3:30 PM Medical Record Number: [id number] Patient Account Number: [id number] Date of Birth/Sex: Treating RN: 1981-05-08 (42 y.o. [name] Primary Care Thresa Dozier: Mila Merry Other Clinician: Referring Giavanni Zeitlin: Treating Travone Georg/Extender: Reinaldo Raddle in Treatment: 1 Encounter Discharge Information  Items Discharge Condition: Stable Ambulatory Status: Wheelchair Discharge Destination: Home Transportation: Private Auto Accompanied By: wife Schedule Follow-up Appointment: Yes Clinical Summary of Care: Electronic Signature(s) Signed: 07/16/2023 5:10:07 PM By: Midge Aver MSN RN CNS WTA Entered By: Midge Aver on 07/16/2023 17:10:07 -------------------------------------------------------------------------------- Lower Extremity Assessment Details Patient Name: Date of Service: [name] [facility] N. 07/16/2023 3:30 PM Medical Record Number: [id number] Patient Account Number: [id number] Date of Birth/Sex: Treating RN: 10-02-1980 (42 y.o. [name] Primary Care Weslie Pretlow: Mila Merry Other Clinician: Referring Martrice Apt: Treating Fabiola Mudgett/Extender: Berenice Primas [place], [place] ([id number]) [id number] Page 4 of 9 Weeks in Treatment: 1 Psychologist, prison and probation services) Signed: 07/17/2023 5:05:03 PM By: Midge Aver MSN RN CNS WTA Entered By: Midge Aver on 07/16/2023 16:09:32 -------------------------------------------------------------------------------- Multi Wound Chart Details Patient Name: Date of Service: Gaylyn Rong Justin [name] [facility] N. 07/16/2023 3:30 PM Medical Record Number: [id number] Patient Account Number: [id number] Date of Birth/Sex: Treating RN: 1980-09-29 (42 y.o. [name] Primary Care Treniyah Lynn: Mila Merry Other Clinician: Referring Raine Elsass: Treating Alohilani Levenhagen/Extender: Reinaldo Raddle in Treatment: 1 Vital Signs Height(in): Pulse(bpm): 88 Weight(lbs): Blood Pressure(mmHg): 127/81 Body Mass Index(BMI): Temperature(F): 98.1 Respiratory Rate(breaths/min): 18 [6:Photos:] [N/A:N/A] Right Gluteus N/A N/A Wound Location: Gradually Appeared N/A N/A Wounding Event: Pressure Ulcer N/A N/A Primary Etiology: Asthma, Pneumothorax, Arrhythmia, N/A N/A Comorbid History: History of pressure  wounds, Osteoarthritis, Paraplegia 07/09/2023 N/A N/A Date Acquired: 1 N/A N/A Weeks of Treatment: Open N/A N/A Wound Status: No N/A N/A Wound Recurrence: 0.4x0.2x0.1 N/A N/A Measurements L x W x D (cm) 0.063 N/A N/A A (cm) : rea 0.006 N/A N/A Volume (cm) : 46.60% N/A N/A % Reduction in A rea: 50.00% N/A N/A % Reduction in Volume: Category/Stage II N/A N/A Classification: None Present N/A N/A Exudate A mount: Medium (34-66%) N/A N/A Granulation A mount: Pale N/A N/A Granulation Quality: None Present (0%) N/A N/A Necrotic A  mount: Fascia: No N/A N/A Exposed Structures: Fat Layer (Subcutaneous Tissue): No Tendon: No Muscle: No Joint: No Bone: No Small (1-33%) N/A N/A Epithelialization: Treatment Notes MANVIK, KOZLOFF ([id number]) [id number] Page 5 of 9 Electronic Signature(s) Signed: 07/17/2023 5:05:03 PM By: Midge Aver MSN RN CNS WTA Entered By: Midge Aver on 07/16/2023 16:11:43 -------------------------------------------------------------------------------- Multi-Disciplinary Care Plan Details Patient Name: Date of Service: [name] [facility] N. 07/16/2023 3:30 PM Medical Record Number: [id number] Patient Account Number: [id number] Date of Birth/Sex: Treating RN: 12-17-1980 (42 y.o. [name] Primary Care Lynard Postlewait: Mila Merry Other Clinician: Referring Loran Auguste: Treating Camara Rosander/Extender: Reinaldo Raddle in Treatment: 1 Active Inactive Pressure Nursing Diagnoses: Knowledge deficit related to causes and risk factors for pressure ulcer development Knowledge deficit related to management of pressures ulcers Potential for impaired tissue integrity related to pressure, friction, moisture, and shear Goals: Patient will remain free from development of additional pressure ulcers Date Initiated: 07/09/2023 Target Resolution Date: 08/09/2023 Goal Status: Active Patient will remain free of pressure  ulcers Date Initiated: 07/09/2023 Target Resolution Date: 08/09/2023 Goal Status: Active Patient/caregiver will verbalize risk factors for pressure ulcer development Date Initiated: 07/09/2023 Target Resolution Date: 08/09/2023 Goal Status: Active Patient/caregiver will verbalize understanding of pressure ulcer management Date Initiated: 07/09/2023 Target Resolution Date: 08/09/2023 Goal Status: Active Interventions: Assess: immobility, friction, shearing, incontinence upon admission and as needed Assess offloading mechanisms upon admission and as needed Assess potential for pressure ulcer upon admission and as needed Provide education on pressure ulcers Notes: Wound/Skin Impairment Nursing Diagnoses: Impaired tissue integrity Knowledge deficit related to ulceration/compromised skin integrity Goals: Patient/caregiver will verbalize understanding of skin care regimen Date Initiated: 07/09/2023 Target Resolution Date: 07/23/2023 Goal Status: Active Ulcer/skin breakdown will have a volume reduction of 30% by week 4 Date Initiated: 07/09/2023 Target Resolution Date: 08/09/2023 Goal Status: Active Ulcer/skin breakdown will have a volume reduction of 50% by week 8 AYVAN, STOCKHAUSEN N ([id number]) [id number] Page 6 of 9 Date Initiated: 07/09/2023 Target Resolution Date: 09/08/2023 Goal Status: Active Interventions: Assess patient/caregiver ability to obtain necessary supplies Assess patient/caregiver ability to perform ulcer/skin care regimen upon admission and as needed Assess ulceration(s) every visit Provide education on ulcer and skin care Treatment Activities: Skin care regimen initiated : 07/09/2023 Notes: Electronic Signature(s) Signed: 07/16/2023 5:09:22 PM By: Midge Aver MSN RN CNS WTA Entered By: Midge Aver on 07/16/2023 17:09:22 -------------------------------------------------------------------------------- Pain Assessment  Details Patient Name: Date of Service: Madaline Guthrie N. 07/16/2023 3:30 PM Medical Record Number: [id number] Patient Account Number: [id number] Date of Birth/Sex: Treating RN: 10-28-1980 (42 y.o. [name] Primary Care Shailynn Fong: Mila Merry Other Clinician: Referring Berlene Dixson: Treating Lakedra Washington/Extender: Reinaldo Raddle in Treatment: 1 Active Problems Location of Pain Severity and Description of Pain Patient Has Paino No Site Locations Pain Management and Medication Current Pain Management: Electronic Signature(s) Signed: 07/17/2023 5:05:03 PM By: Midge Aver MSN RN CNS WTA Entered By: Midge Aver on 07/16/2023 16:03:36 Jannifer Rodney ([id number]) [id number] Page 7 of 9 -------------------------------------------------------------------------------- Patient/Caregiver Education Details Patient Name: Date of Service: [name] [facility] 10/29/2024andnbsp3:30 PM Medical Record Number: [id number] Patient Account Number: [id number] Date of Birth/Gender: Treating RN: [birth year] (42 y.o. [name] Primary Care Physician: Mila Merry Other Clinician: Referring Physician: Treating Physician/Extender: Reinaldo Raddle in Treatment: 1 Education Assessment Education Provided To: Patient Education Topics Provided Wound/Skin Impairment: Handouts: Caring for Your Ulcer Methods: Explain/Verbal Responses: State content correctly Electronic Signature(s) Signed: 07/17/2023 5:05:03 PM  By: Midge Aver MSN RN CNS WTA Entered By: Midge Aver on 07/16/2023 17:09:34 -------------------------------------------------------------------------------- Wound Assessment Details Patient Name: Date of Service: [name] [facility] N. 07/16/2023 3:30 PM Medical Record Number: [id number] Patient Account Number: [id number] Date of Birth/Sex: Treating RN: 11/04/1980 (42 y.o. [name] Primary Care Meeka Cartelli:  Mila Merry Other Clinician: Referring Zymir Napoli: Treating Beckham Buxbaum/Extender: Reinaldo Raddle in Treatment: 1 Wound Status Wound Number: 6 Primary Pressure Ulcer Etiology: Wound Location: Right Gluteus Wound Open Wounding Event: Gradually Appeared Status: Date Acquired: 07/09/2023 Comorbid Asthma, Pneumothorax, Arrhythmia, History of pressure Weeks Of Treatment: 1 History: wounds, Osteoarthritis, Paraplegia Clustered Wound: No Photos CERRONE, BARBUSH ([id number]) [id number] Page 8 of 9 Wound Measurements Length: (cm) 0.4 Width: (cm) 0.2 Depth: (cm) 0.1 Area: (cm) 0.063 Volume: (cm) 0.006 % Reduction in Area: 46.6% % Reduction in Volume: 50% Epithelialization: Small (1-33%) Wound Description Classification: Category/Stage II Exudate Amount: None Present Foul Odor After Cleansing: No Slough/Fibrino No Wound Bed Granulation Amount: Medium (34-66%) Exposed Structure Granulation Quality: Pale Fascia Exposed: No Necrotic Amount: None Present (0%) Fat Layer (Subcutaneous Tissue) Exposed: No Tendon Exposed: No Muscle Exposed: No Joint Exposed: No Bone Exposed: No Treatment Notes Wound #6 (Gluteus) Wound Laterality: Right Cleanser Soap and Water Discharge Instruction: Gently cleanse wound with antibacterial soap, rinse and pat dry prior to dressing wounds Vashe 5.8 (oz) Discharge Instruction: Use vashe 5.8 (oz) as directed Peri-Wound Care Topical CALMOSEPTINE Primary Dressing Secondary Dressing Secured With Compression Wrap Compression Stockings Add-Ons Electronic Signature(s) Signed: 07/17/2023 5:05:03 PM By: Midge Aver MSN RN CNS WTA Entered By: Midge Aver on 07/16/2023 16:09:19 Jannifer Rodney ([id number]) [id number] Page 9 of 9 -------------------------------------------------------------------------------- Vitals Details Patient Name: Date of Service: [name] [facility] N.  07/16/2023 3:30 PM Medical Record Number: [id number] Patient Account Number: [id number] Date of Birth/Sex: Treating RN: September 24, 1980 (42 y.o. [name] Primary Care Garlene Apperson: Mila Merry Other Clinician: Referring Josedaniel Haye: Treating Zi Newbury/Extender: Reinaldo Raddle in Treatment: 1 Vital Signs Time Taken: 16:03 Temperature (F): 98.1 Pulse (bpm): 88 Respiratory Rate (breaths/min): 18 Blood Pressure (mmHg): 127/81 Reference Range: 80 - 120 mg / dl Electronic Signature(s) Signed: 07/17/2023 5:05:03 PM By: Midge Aver MSN RN CNS WTA Entered By: Midge Aver on 07/16/2023 16:03:30

## 2023-07-23 DIAGNOSIS — K5900 Constipation, unspecified: Secondary | ICD-10-CM | POA: Diagnosis not present

## 2023-07-23 DIAGNOSIS — K5903 Drug induced constipation: Secondary | ICD-10-CM | POA: Diagnosis not present

## 2023-07-23 DIAGNOSIS — T402X5A Adverse effect of other opioids, initial encounter: Secondary | ICD-10-CM | POA: Diagnosis not present

## 2023-07-23 DIAGNOSIS — K219 Gastro-esophageal reflux disease without esophagitis: Secondary | ICD-10-CM | POA: Diagnosis not present

## 2023-07-23 DIAGNOSIS — N319 Neuromuscular dysfunction of bladder, unspecified: Secondary | ICD-10-CM | POA: Diagnosis not present

## 2023-07-23 DIAGNOSIS — G8222 Paraplegia, incomplete: Secondary | ICD-10-CM | POA: Diagnosis not present

## 2023-07-25 ENCOUNTER — Ambulatory Visit: Payer: Self-pay

## 2023-07-25 NOTE — Telephone Encounter (Signed)
Chief Complaint: Medication Question    Disposition: [] ED /[] Urgent Care (no appt availability in office) / [] Appointment(In office/virtual)/ []  [place] [facility]/ [] Home Care/ [] Refused Recommended Disposition /[] [place] [facility]/ [x]  Follow-up with PCP Additional Notes: Spoke to [place] at [facility] pharmacy who stated patient has picked up the Cefdinir 300 MG medication with a quality of 14 tablets for 7 days. [place] stated that is only enough for the 7 day part of the order but the sig states to take 1 tablet daily after completing the 7 day course. Advised I would forward to PCP for clarification.    Summary: medication clarification from pharmacy   [place] with [facility] has called for medication clarification for medication cefdinir (OMNICEF) 300 MG capsule. Per [place], he states the prescription was only wrote for 7 days but he was not sure if Dr [name] wanted him to take this medication for longer per the instructions he received does not match the quantity.  [place] states patient went ahead and picked up the 7 days worth of medication but was not sure if patient is supposed to take this longer since it says "take 1 tablet once a day" after that.  [facility] callback # [phone]     Reason for Disposition  [1] Caller has NON-URGENT medicine question about med that PCP prescribed AND [2] triager unable to answer question  Answer Assessment - Initial Assessment Questions 1. NAME of MEDICINE: "What medicine(s) are you calling about?"     cefdinir (OMNICEF) 300 MG capsule. 2. QUESTION: "What is your question?" (e.g., double dose of medicine, side effect)     Should the quality on the Rx be more since the sig reads: Take 2 tablets x 7 days, then 1 daily  3. PRESCRIBER: "Who prescribed the medicine?" Reason: if prescribed by specialist, call should be referred to that group.     Dr. [name]  Protocols used: Medication Question Call-A-AH

## 2023-07-26 NOTE — Telephone Encounter (Signed)
The Sig was carried over from a previous prescription, he just needs to take the 7 day course for the prescription that was sent on 10/25.

## 2023-07-26 NOTE — Addendum Note (Signed)
Addended by: Malva Limes on: 07/26/2023 04:48 PM   Modules accepted: Orders

## 2023-07-29 ENCOUNTER — Encounter: Payer: Medicare HMO | Attending: Physician Assistant | Admitting: Physician Assistant

## 2023-07-29 ENCOUNTER — Other Ambulatory Visit: Payer: Self-pay | Admitting: Physical Medicine and Rehabilitation

## 2023-07-29 ENCOUNTER — Other Ambulatory Visit: Payer: Self-pay | Admitting: Family Medicine

## 2023-07-29 DIAGNOSIS — M1990 Unspecified osteoarthritis, unspecified site: Secondary | ICD-10-CM | POA: Insufficient documentation

## 2023-07-29 DIAGNOSIS — J45909 Unspecified asthma, uncomplicated: Secondary | ICD-10-CM | POA: Insufficient documentation

## 2023-07-29 DIAGNOSIS — G8222 Paraplegia, incomplete: Secondary | ICD-10-CM | POA: Diagnosis not present

## 2023-07-29 DIAGNOSIS — L89312 Pressure ulcer of right buttock, stage 2: Secondary | ICD-10-CM | POA: Insufficient documentation

## 2023-07-29 NOTE — Telephone Encounter (Signed)
Patient was told to stop cefdinir already.

## 2023-07-29 NOTE — Progress Notes (Addendum)
Justin [name], Justin [name] ([id number]) [id number] Page 1 of 6 Visit Report for 07/29/2023 Chief Complaint Document Details Patient Name: Date of Service: Justin [name] [facility] N. 07/29/2023 3:30 PM Medical Record Number: [id number] Patient Account Number: [id number] Date of Birth/Sex: Treating RN: 1980-10-18 (42 y.o. Justin [name] Primary Care Provider: Mila Merry Other Clinician: Referring Provider: Treating Provider/Extender: Reinaldo Raddle in Treatment: 2 Information Obtained from: Patient Chief Complaint Sacral/gluteal pressure ulcer Electronic Signature(s) Signed: 07/29/2023 3:58:56 PM By: Allen Derry PA-C Entered By: Allen Derry on 07/29/2023 12:58:56 -------------------------------------------------------------------------------- HPI Details Patient Name: Date of Service: Justin [name] [name] 07/29/2023 3:30 PM Medical Record Number: [id number] Patient Account Number: [id number] Date of Birth/Sex: Treating RN: September 19, 1980 (42 y.o. Justin [name] Primary Care Provider: Mila Merry Other Clinician: Referring Provider: Treating Provider/Extender: Reinaldo Raddle in Treatment: 2 History of Present Illness HPI Description: 06/06/2021 upon evaluation today patient presents for repeat evaluation here in the clinic although its actually been a number of years since he was seen here. This was actually in 2016. He saw Dr. Lawerance Bach at that time. Nonetheless the patient tells me that based on where things stand currently he is done fairly well he has intermittently had issues with the gluteal region but nothing sustained like what he has right now the just does not seem to want to heal. He has been using Silvadene and cleaning with peroxide. Fortunately there does not appear to be any signs of infection which is great news and this is fairly superficial. The good news is from a healing standpoint this should actually probably  heal fairly quickly the bad news is with him being a paraplegic even incomplete he has some movement and he has excellent upper body strength but nonetheless he still can have to be very careful about pressure and appropriate offloading and this was the majority of the conversation we had today. Readmission: 07-09-2023 upon evaluation today patient appears to be doing decently well overall. This is a gentleman that I have seen several times over the past couple of years since 2022. With that being said he typically takes excellent care of himself and therefore wounds are not that frequent for him which is excellent news. I do not see any signs of active infection at this time which is good news he does have a small area which is mainly crust and scab covered at this point and that is something that we are trying to help to improve. He has been using some Silvadene cream here in the area which in the past we have discussed as well but he does tend to feel like that does well for him. Nonetheless right now he tells me just been using a Band-Aid over it when it was open and just to make sure that nothing got rubbed he is continued that Band-Aid without any actual dressing underneath. Justin [name] ([id number]) [id number] Page 2 of 6 Patient's past medical history really has not changed is fairly healthy beyond what he has going on with the wounds. 07-16-2023 upon evaluation today patient appears to be doing well currently in regard to the wound which is actually showing signs of improvement. I am actually very pleased with where things stand today. I do not see any signs of active infection locally or systemically at this time. 07-29-2023 upon evaluation today patient appears to be doing well currently in regard to his wound. Has been tolerating the dressing changes without complication. Fortunately  I do not see any signs of active infection locally or systemically which  is great news and in general I do believe that we are making really good headway here towards closure. No fevers, chills, nausea, vomiting, or diarrhea. Electronic Signature(s) Signed: 07/29/2023 4:20:26 PM By: Allen Derry PA-C Entered By: Allen Derry on 07/29/2023 13:20:26 -------------------------------------------------------------------------------- Physical Exam Details Patient Name: Date of Service: Justin [name] [facility] N. 07/29/2023 3:30 PM Medical Record Number: [id number] Patient Account Number: [id number] Date of Birth/Sex: Treating RN: Oct 30, 1980 (42 y.o. Justin [name] Primary Care Provider: Mila Merry Other Clinician: Referring Provider: Treating Provider/Extender: Reinaldo Raddle in Treatment: 2 Constitutional Well-nourished and well-hydrated in no acute distress. Respiratory normal breathing without difficulty. Psychiatric this patient is able to make decisions and demonstrates good insight into disease process. Alert and Oriented x 3. pleasant and cooperative. Notes Upon inspection patient's wound bed actually showed signs of good granulation epithelization at this point. Fortunately I do not see any evidence of worsening overall and I believe that the patient is making pretty good headway here towards closure. The original wound is doing better he does have a small area that he has an abrasion on just around the edge of the wound that I think is open at this point and is gena need to be addressed but again the original wound I think may actually be closed with just a little bit of a dry patch noted at this point. Electronic Signature(s) Signed: 07/29/2023 4:20:43 PM By: Allen Derry PA-C Entered By: Allen Derry on 07/29/2023 13:20:43 -------------------------------------------------------------------------------- Physician Orders Details Patient Name: Date of Service: Justin [name] [facility] N. 07/29/2023 3:30 PM Medical Record Number:  [id number] Patient Account Number: [id number] Date of Birth/Sex: Treating RN: 1981-04-30 (42 y.o. Justin [name] Primary Care Provider: Mila Merry Other Clinician: Referring Provider: Treating Provider/Extender: Berenice Primas [place], [place] ([id number]) [id number] Page 3 of 6 Weeks in Treatment: 2 The following information was scribed by: Midge Aver The information was scribed for: Allen Derry Verbal / Phone Orders: No Diagnosis Coding ICD-10 Coding Code Description G82.22 Paraplegia, incomplete L89.312 Pressure ulcer of right buttock, stage 2 Follow-up Appointments Return Appointment in 1 week. Bathing/ Shower/ Hygiene Clean wound with Normal Saline or wound cleanser. Wound Treatment Wound #6 - Gluteus Wound Laterality: Right Cleanser: Soap and Water 1 x Per Day/30 Days Discharge Instructions: Gently cleanse wound with antibacterial soap, rinse and pat dry prior to dressing wounds Cleanser: Vashe 5.8 (oz) 1 x Per Day/30 Days Discharge Instructions: Use vashe 5.8 (oz) as directed Topical: CALMOSEPTINE 1 x Per Day/30 Days Prim Dressing: Xeroform-HBD 2x2 (in/in) 1 x Per Day/30 Days ary Discharge Instructions: Apply Xeroform-HBD 2x2 (in/in) as directed Prim Dressing: (BORDER) Zetuvit Plus Silicone Border Dressing 4x4 (in/in) ary 1 x Per Day/30 Days Electronic Signature(s) Signed: 07/29/2023 4:31:01 PM By: Allen Derry PA-C Signed: 07/29/2023 4:54:56 PM By: Midge Aver MSN RN CNS WTA Previous Signature: 07/29/2023 4:02:07 PM Version By: Midge Aver MSN RN CNS WTA Entered By: Midge Aver on 07/29/2023 13:18:37 -------------------------------------------------------------------------------- Problem List Details Patient Name: Date of Service: Gaylyn Rong Lauris [name] [facility] N. 07/29/2023 3:30 PM Medical Record Number: [id number] Patient Account Number: [id number] Date of Birth/Sex: Treating RN: Jan 26, 1981 (42 y.o. Justin [name] Primary  Care Provider: Mila Merry Other Clinician: Referring Provider: Treating Provider/Extender: Reinaldo Raddle in Treatment: 2 Active Problems ICD-10 Encounter Code Description Active Date MDM Diagnosis G82.22 Paraplegia, incomplete 07/09/2023 No Yes L89.312 Pressure  ulcer of right buttock, stage 2 07/09/2023 No Yes Justin [name], Justin [name] ([id number]) [id number] Page 4 of 6 Inactive Problems Resolved Problems Electronic Signature(s) Signed: 07/29/2023 3:58:49 PM By: Allen Derry PA-C Entered By: Allen Derry on 07/29/2023 12:58:49 -------------------------------------------------------------------------------- Progress Note Details Patient Name: Date of Service: Justin [name] [facility] N. 07/29/2023 3:30 PM Medical Record Number: [id number] Patient Account Number: [id number] Date of Birth/Sex: Treating RN: 1981/04/14 (42 y.o. Justin [name] Primary Care Provider: Mila Merry Other Clinician: Referring Provider: Treating Provider/Extender: Reinaldo Raddle in Treatment: 2 Subjective Chief Complaint Information obtained from Patient Sacral/gluteal pressure ulcer History of Present Illness (HPI) 06/06/2021 upon evaluation today patient presents for repeat evaluation here in the clinic although its actually been a number of years since he was seen here. This was actually in 2016. He saw Dr. Lawerance Bach at that time. Nonetheless the patient tells me that based on where things stand currently he is done fairly well he has intermittently had issues with the gluteal region but nothing sustained like what he has right now the just does not seem to want to heal. He has been using Silvadene and cleaning with peroxide. Fortunately there does not appear to be any signs of infection which is great news and this is fairly superficial. The good news is from a healing standpoint this should actually probably heal fairly quickly the bad news is with  him being a paraplegic even incomplete he has some movement and he has excellent upper body strength but nonetheless he still can have to be very careful about pressure and appropriate offloading and this was the majority of the conversation we had today. Readmission: 07-09-2023 upon evaluation today patient appears to be doing decently well overall. This is a gentleman that I have seen several times over the past couple of years since 2022. With that being said he typically takes excellent care of himself and therefore wounds are not that frequent for him which is excellent news. I do not see any signs of active infection at this time which is good news he does have a small area which is mainly crust and scab covered at this point and that is something that we are trying to help to improve. He has been using some Silvadene cream here in the area which in the past we have discussed as well but he does tend to feel like that does well for him. Nonetheless right now he tells me just been using a Band-Aid over it when it was open and just to make sure that nothing got rubbed he is continued that Band-Aid without any actual dressing underneath. Patient's past medical history really has not changed is fairly healthy beyond what he has going on with the wounds. 07-16-2023 upon evaluation today patient appears to be doing well currently in regard to the wound which is actually showing signs of improvement. I am actually very pleased with where things stand today. I do not see any signs of active infection locally or systemically at this time. 07-29-2023 upon evaluation today patient appears to be doing well currently in regard to his wound. Has been tolerating the dressing changes without complication. Fortunately I do not see any signs of active infection locally or systemically which is great news and in general I do believe that we are making really good headway here towards closure. No fevers, chills,  nausea, vomiting, or diarrhea. Objective Constitutional Well-nourished and well-hydrated in no acute distress. Justin [name], Justin [name] ([id number]) [id number] Page  5 of 6 Vitals Time Taken: 3:46 PM, Temperature: 98.7 F, Pulse: 117 bpm, Respiratory Rate: 18 breaths/min, Blood Pressure: 127/86 mmHg. Respiratory normal breathing without difficulty. Psychiatric this patient is able to make decisions and demonstrates good insight into disease process. Alert and Oriented x 3. pleasant and cooperative. General Notes: Upon inspection patient's wound bed actually showed signs of good granulation epithelization at this point. Fortunately I do not see any evidence of worsening overall and I believe that the patient is making pretty good headway here towards closure. The original wound is doing better he does have a small area that he has an abrasion on just around the edge of the wound that I think is open at this point and is gena need to be addressed but again the original wound I think may actually be closed with just a little bit of a dry patch noted at this point. Integumentary (Hair, Skin) Wound #6 status is Open. Original cause of wound was Gradually Appeared. The date acquired was: 07/09/2023. The wound has been in treatment 2 weeks. The wound is located on the Right Gluteus. The wound measures 1cm length x 1cm width x 0.1cm depth; 0.785cm^2 area and 0.079cm^3 volume. There is a none present amount of drainage noted. There is medium (34-66%) pale granulation within the wound bed. There is no necrotic tissue within the wound bed. Assessment Active Problems ICD-10 Paraplegia, incomplete Pressure ulcer of right buttock, stage 2 Plan Follow-up Appointments: Return Appointment in 1 week. Bathing/ Shower/ Hygiene: Clean wound with Normal Saline or wound cleanser. WOUND #6: - Gluteus Wound Laterality: Right Cleanser: Soap and Water 1 x Per Day/30 Days Discharge  Instructions: Gently cleanse wound with antibacterial soap, rinse and pat dry prior to dressing wounds Cleanser: Vashe 5.8 (oz) 1 x Per Day/30 Days Discharge Instructions: Use vashe 5.8 (oz) as directed Topical: CALMOSEPTINE 1 x Per Day/30 Days Prim Dressing: Xeroform-HBD 2x2 (in/in) 1 x Per Day/30 Days ary Discharge Instructions: Apply Xeroform-HBD 2x2 (in/in) as directed Prim Dressing: (BORDER) Zetuvit Plus Silicone Border Dressing 4x4 (in/in) 1 x Per Day/30 Days ary 1. We have the patient continue to monitor for any signs of infection or worsening. Based on what I am seeing I do believe that we are making good headway here towards closure. 2. I am going to recommend that he use Xeroform gauze followed by Zetuvit which I think should do quite well. 3. I recommend that he continue to monitor for any signs of infection if anything changes he knows to contact the office let me know. We will see patient back for reevaluation in 1 week here in the clinic. If anything worsens or changes patient will contact our office for additional recommendations. Electronic Signature(s) Signed: 07/29/2023 4:21:43 PM By: Allen Derry PA-C Entered By: Allen Derry on 07/29/2023 13:21:43 -------------------------------------------------------------------------------- SuperBill Details Patient Name: Date of Service: Justin Lovey [facility] 07/29/2023 Justin [name], Justin [name] ([id number]) [id number] Page 6 of 6 Medical Record Number: [id number] Patient Account Number: [id number] Date of Birth/Sex: Treating RN: [birth year] (42 y.o. Justin [name] Primary Care Provider: Mila Merry Other Clinician: Referring Provider: Treating Provider/Extender: Reinaldo Raddle in Treatment: 2 Diagnosis Coding ICD-10 Codes Code Description G82.22 Paraplegia, incomplete L89.312 Pressure ulcer of right buttock, stage 2 Facility Procedures : CPT4 Code: [id number] Description: 99213 -  WOUND CARE VISIT-LEV 3 EST PT Modifier: Quantity: 1 Physician Procedures : CPT4 Code Description Modifier [id number] 99213 - WC PHYS LEVEL 3 - EST PT ICD-10 Diagnosis Description  G82.22 Paraplegia, incomplete L89.312 Pressure ulcer of right buttock, stage 2 Quantity: 1 Electronic Signature(s) Signed: 07/29/2023 4:21:57 PM By: Allen Derry PA-C Previous Signature: 07/29/2023 4:03:12 PM Version By: Midge Aver MSN RN CNS WTA Entered By: Allen Derry on 07/29/2023 13:21:57

## 2023-07-29 NOTE — Progress Notes (Signed)
BINYAMIN, KLYM ([id number]) [id number] Page 1 of 9 Visit Report for 07/29/2023 Arrival Information Details Patient Name: Date of Service: [name] [facility] N. 07/29/2023 3:30 PM Medical Record Number: [id number] Patient Account Number: [id number] Date of Birth/Sex: Treating RN: [birth year] (42 y.o. [name] Primary Care Kierston Plasencia: Mila Merry Other Clinician: Referring Jamerson Vonbargen: Treating Terez Montee/Extender: Reinaldo Raddle in Treatment: 2 Visit Information History Since Last Visit Added or deleted any medications: No Patient Arrived: Wheel Chair Any new allergies or adverse reactions: No Arrival Time: 15:45 Has Dressing in Place as Prescribed: Yes Accompanied By: wife Pain Present Now: No Transfer Assistance: None Patient Identification Verified: Yes Secondary Verification Process Completed: Yes Patient Requires Transmission-Based Precautions: No Patient Has Alerts: No Electronic Signature(s) Signed: 07/29/2023 4:01:14 PM By: Midge Aver MSN RN CNS WTA Entered By: Midge Aver on 07/29/2023 13:01:13 -------------------------------------------------------------------------------- Clinic Level of Care Assessment Details Patient Name: Date of Service: [name] [facility] N. 07/29/2023 3:30 PM Medical Record Number: [id number] Patient Account Number: [id number] Date of Birth/Sex: Treating RN: 03-30-1981 (42 y.o. [name] Primary Care Artemisa Sladek: Mila Merry Other Clinician: Referring Daking Westervelt: Treating Oisin Yoakum/Extender: Reinaldo Raddle in Treatment: 2 Clinic Level of Care Assessment Items TOOL 4 Quantity Score X- 1 0 Use when only an EandM is performed on FOLLOW-UP visit ASSESSMENTS - Nursing Assessment / Reassessment X- 1 10 Reassessment of Co-morbidities (includes updates in patient status) X- 1 5 Reassessment of Adherence to Treatment Plan ASSESSMENTS - Wound and Skin A ssessment /  Reassessment X - Simple Wound Assessment / Reassessment - one wound 1 5 []  - 0 Complex Wound Assessment / Reassessment - multiple wounds ELIOENAI, HINKLE ([id number]) [phone] Page 2 of 9 []  - 0 Dermatologic / Skin Assessment (not related to wound area) ASSESSMENTS - Focused Assessment []  - 0 Circumferential Edema Measurements - multi extremities []  - 0 Nutritional Assessment / Counseling / Intervention []  - 0 Lower Extremity Assessment (monofilament, tuning fork, pulses) []  - 0 Peripheral Arterial Disease Assessment (using hand held doppler) ASSESSMENTS - Ostomy and/or Continence Assessment and Care []  - 0 Incontinence Assessment and Management []  - 0 Ostomy Care Assessment and Management (repouching, etc.) PROCESS - Coordination of Care X - Simple Patient / Family Education for ongoing care 1 15 []  - 0 Complex (extensive) Patient / Family Education for ongoing care X- 1 10 Staff obtains Chiropractor, Records, T Results / Process Orders est []  - 0 Staff telephones HHA, Nursing Homes / Clarify orders / etc []  - 0 Routine Transfer to another Facility (non-emergent condition) []  - 0 Routine Hospital Admission (non-emergent condition) []  - 0 New Admissions / Manufacturing engineer / Ordering NPWT Apligraf, etc. , []  - 0 Emergency Hospital Admission (emergent condition) X- 1 10 Simple Discharge Coordination []  - 0 Complex (extensive) Discharge Coordination PROCESS - Special Needs []  - 0 Pediatric / Minor Patient Management []  - 0 Isolation Patient Management []  - 0 Hearing / Language / Visual special needs []  - 0 Assessment of Community assistance (transportation, D/C planning, etc.) []  - 0 Additional assistance / Altered mentation []  - 0 Support Surface(s) Assessment (bed, cushion, seat, etc.) INTERVENTIONS - Wound Cleansing / Measurement X - Simple Wound Cleansing - one wound 1 5 []  - 0 Complex Wound Cleansing - multiple wounds X-  1 5 Wound Imaging (photographs - any number of wounds) []  - 0 Wound Tracing (instead of photographs) X- 1 5 Simple Wound Measurement - one wound []  -  0 Complex Wound Measurement - multiple wounds INTERVENTIONS - Wound Dressings X - Small Wound Dressing one or multiple wounds 1 10 []  - 0 Medium Wound Dressing one or multiple wounds []  - 0 Large Wound Dressing one or multiple wounds []  - 0 Application of Medications - topical []  - 0 Application of Medications - injection INTERVENTIONS - Miscellaneous []  - 0 External ear exam []  - 0 Specimen Collection (cultures, biopsies, blood, body fluids, etc.) []  - 0 Specimen(s) / Culture(s) sent or taken to Lab for analysis CALIL, VANELLA ([id number]) [id number] Page 3 of 9 []  - 0 Patient Transfer (multiple staff / Nurse, adult / Similar devices) []  - 0 Simple Staple / Suture removal (25 or less) []  - 0 Complex Staple / Suture removal (26 or more) []  - 0 Hypo / Hyperglycemic Management (close monitor of Blood Glucose) []  - 0 Ankle / Brachial Index (ABI) - do not check if billed separately X- 1 5 Vital Signs Has the patient been seen at the hospital within the last three years: Yes Total Score: 85 Level Of Care: New/Established - Level 3 Electronic Signature(s) Signed: 07/29/2023 4:54:56 PM By: Midge Aver MSN RN CNS WTA Entered By: Midge Aver on 07/29/2023 13:18:45 -------------------------------------------------------------------------------- Encounter Discharge Information Details Patient Name: Date of Service: Gaylyn Rong [name] [name] 07/29/2023 3:30 PM Medical Record Number: [id number] Patient Account Number: [id number] Date of Birth/Sex: Treating RN: 03/29/1981 (42 y.o. [name] Primary Care Michael Ventresca: Mila Merry Other Clinician: Referring Trinitee Horgan: Treating Catrena Vari/Extender: Reinaldo Raddle in Treatment: 2 Encounter Discharge Information Items Discharge  Condition: Stable Ambulatory Status: Wheelchair Discharge Destination: Home Transportation: Private Auto Accompanied By: wife Schedule Follow-up Appointment: Yes Clinical Summary of Care: Electronic Signature(s) Signed: 07/29/2023 4:54:56 PM By: Midge Aver MSN RN CNS WTA Entered By: Midge Aver on 07/29/2023 13:19:58 -------------------------------------------------------------------------------- Lower Extremity Assessment Details Patient Name: Date of Service: [name] [facility] N. 07/29/2023 3:30 PM Medical Record Number: [id number] Patient Account Number: [id number] Date of Birth/Sex: Treating RN: 22-Jul-1981 (42 y.o. [name] Primary Care Cashtyn Pouliot: Mila Merry Other Clinician: Referring Hakiem Malizia: Treating Deondra Wigger/Extender: Berenice Primas [place], [place] ([id number]) [id number] Page 4 of 9 Weeks in Treatment: 2 Electronic Signature(s) Signed: 07/29/2023 4:01:30 PM By: Midge Aver MSN RN CNS WTA Entered By: Midge Aver on 07/29/2023 13:01:30 -------------------------------------------------------------------------------- Multi Wound Chart Details Patient Name: Date of Service: Mariel Sleet [facility] N. 07/29/2023 3:30 PM Medical Record Number: [id number] Patient Account Number: [id number] Date of Birth/Sex: Treating RN: 1981-02-26 (42 y.o. [name] Primary Care Starasia Sinko: Mila Merry Other Clinician: Referring Jermani Pund: Treating Hortense Cantrall/Extender: Reinaldo Raddle in Treatment: 2 Vital Signs Height(in): Pulse(bpm): 117 Weight(lbs): Blood Pressure(mmHg): 127/86 Body Mass Index(BMI): Temperature(F): 98.7 Respiratory Rate(breaths/min): 18 [6:Photos:] [N/A:N/A] Right Gluteus N/A N/A Wound Location: Gradually Appeared N/A N/A Wounding Event: Pressure Ulcer N/A N/A Primary Etiology: Asthma, Pneumothorax, Arrhythmia, N/A N/A Comorbid History: History of pressure wounds, Osteoarthritis,  Paraplegia 07/09/2023 N/A N/A Date Acquired: 2 N/A N/A Weeks of Treatment: Open N/A N/A Wound Status: No N/A N/A Wound Recurrence: 1x1x0.1 N/A N/A Measurements L x W x D (cm) 0.785 N/A N/A A (cm) : rea 0.079 N/A N/A Volume (cm) : -565.30% N/A N/A % Reduction in A rea: -558.30% N/A N/A % Reduction in Volume: Category/Stage II N/A N/A Classification: None Present N/A N/A Exudate A mount: Medium (34-66%) N/A N/A Granulation A mount: Pale N/A N/A Granulation Quality: None Present (0%) N/A N/A Necrotic A  mount: Fascia: No N/A N/A Exposed Structures: Fat Layer (Subcutaneous Tissue): No Tendon: No Muscle: No Joint: No Bone: No Small (1-33%) N/A N/A Epithelialization: Treatment Notes SUJAL, ALDAMA ([id number]) [id number] Page 5 of 9 Electronic Signature(s) Signed: 07/29/2023 4:54:56 PM By: Midge Aver MSN RN CNS WTA Previous Signature: 07/29/2023 4:01:38 PM Version By: Midge Aver MSN RN CNS WTA Entered By: Midge Aver on 07/29/2023 13:05:06 -------------------------------------------------------------------------------- Multi-Disciplinary Care Plan Details Patient Name: Date of Service: Mariel Sleet [facility] N. 07/29/2023 3:30 PM Medical Record Number: [id number] Patient Account Number: [id number] Date of Birth/Sex: Treating RN: 1981/03/07 (42 y.o. [name] Primary Care Tyreke Kaeser: Mila Merry Other Clinician: Referring Jhonathan Desroches: Treating Tiea Manninen/Extender: Reinaldo Raddle in Treatment: 2 Active Inactive Pressure Nursing Diagnoses: Knowledge deficit related to causes and risk factors for pressure ulcer development Knowledge deficit related to management of pressures ulcers Potential for impaired tissue integrity related to pressure, friction, moisture, and shear Goals: Patient will remain free from development of additional pressure ulcers Date Initiated: 07/09/2023 Target Resolution Date:  08/09/2023 Goal Status: Active Patient will remain free of pressure ulcers Date Initiated: 07/09/2023 Target Resolution Date: 08/09/2023 Goal Status: Active Patient/caregiver will verbalize risk factors for pressure ulcer development Date Initiated: 07/09/2023 Target Resolution Date: 08/09/2023 Goal Status: Active Patient/caregiver will verbalize understanding of pressure ulcer management Date Initiated: 07/09/2023 Target Resolution Date: 08/09/2023 Goal Status: Active Interventions: Assess: immobility, friction, shearing, incontinence upon admission and as needed Assess offloading mechanisms upon admission and as needed Assess potential for pressure ulcer upon admission and as needed Provide education on pressure ulcers Notes: Wound/Skin Impairment Nursing Diagnoses: Impaired tissue integrity Knowledge deficit related to ulceration/compromised skin integrity Goals: Patient/caregiver will verbalize understanding of skin care regimen Date Initiated: 07/09/2023 Target Resolution Date: 07/23/2023 Goal Status: Active Ulcer/skin breakdown will have a volume reduction of 30% by week 4 Date Initiated: 07/09/2023 Target Resolution Date: 08/09/2023 Goal Status: [address] [place], [place] ([id number]) [id number] Page 6 of 9 Ulcer/skin breakdown will have a volume reduction of 50% by week 8 Date Initiated: 07/09/2023 Target Resolution Date: 09/08/2023 Goal Status: Active Interventions: Assess patient/caregiver ability to obtain necessary supplies Assess patient/caregiver ability to perform ulcer/skin care regimen upon admission and as needed Assess ulceration(s) every visit Provide education on ulcer and skin care Treatment Activities: Skin care regimen initiated : 07/09/2023 Notes: Electronic Signature(s) Signed: 07/29/2023 4:54:56 PM By: Midge Aver MSN RN CNS WTA Previous Signature: 07/29/2023 4:03:19 PM Version By: Midge Aver MSN RN CNS WTA Entered  By: Midge Aver on 07/29/2023 13:18:57 -------------------------------------------------------------------------------- Pain Assessment Details Patient Name: Date of Service: Mariel Sleet [name] 07/29/2023 3:30 PM Medical Record Number: [id number] Patient Account Number: [id number] Date of Birth/Sex: Treating RN: 11/05/1980 (42 y.o. [name] Primary Care Payne Garske: Mila Merry Other Clinician: Referring Winson Eichorn: Treating Ronisha Herringshaw/Extender: Reinaldo Raddle in Treatment: 2 Active Problems Location of Pain Severity and Description of Pain Patient Has Paino No Site Locations Pain Management and Medication Current Pain Management: Electronic Signature(s) Signed: 07/29/2023 4:01:22 PM By: Midge Aver MSN RN CNS WTA Entered By: Midge Aver on 07/29/2023 13:01:22 PRIYANSH, TRAYER ([id number]) [id number] Page 7 of 9 -------------------------------------------------------------------------------- Patient/Caregiver Education Details Patient Name: Date of Service: [name] [facility] 11/11/2024andnbsp3:30 PM Medical Record Number: [id number] Patient Account Number: [id number] Date of Birth/Gender: Treating RN: 09/04/1981 (42 y.o. [name] Primary Care Physician: Mila Merry Other Clinician: Referring Physician: Treating Physician/Extender: Reinaldo Raddle in Treatment: 2 Education Assessment  Education Provided To: Patient Education Topics Provided Wound/Skin Impairment: Handouts: Caring for Your Ulcer Methods: Explain/Verbal Responses: State content correctly Electronic Signature(s) Signed: 07/29/2023 4:54:56 PM By: Midge Aver MSN RN CNS WTA Entered By: Midge Aver on 07/29/2023 13:19:05 -------------------------------------------------------------------------------- Wound Assessment Details Patient Name: Date of Service: Mariel Sleet [facility] N. 07/29/2023 3:30 PM Medical Record Number:  [id number] Patient Account Number: [id number] Date of Birth/Sex: Treating RN: 11-28-1980 (42 y.o. [name] Primary Care Enrique Manganaro: Mila Merry Other Clinician: Referring Cayleen Benjamin: Treating Harman Ferrin/Extender: Reinaldo Raddle in Treatment: 2 Wound Status Wound Number: 6 Primary Pressure Ulcer Etiology: Wound Location: Right Gluteus Wound Open Wounding Event: Gradually Appeared Status: Date Acquired: 07/09/2023 Comorbid Asthma, Pneumothorax, Arrhythmia, History of pressure Weeks Of Treatment: 2 History: wounds, Osteoarthritis, Paraplegia Clustered Wound: No Photos HERALD, PUGMIRE ([id number]) [id number] Page 8 of 9 Wound Measurements Length: (cm) 1 Width: (cm) 1 Depth: (cm) 0.1 Area: (cm) 0.785 Volume: (cm) 0.079 % Reduction in Area: -565.3% % Reduction in Volume: -558.3% Epithelialization: Small (1-33%) Wound Description Classification: Category/Stage II Exudate Amount: None Present Foul Odor After Cleansing: No Slough/Fibrino No Wound Bed Granulation Amount: Medium (34-66%) Exposed Structure Granulation Quality: Pale Fascia Exposed: No Necrotic Amount: None Present (0%) Fat Layer (Subcutaneous Tissue) Exposed: No Tendon Exposed: No Muscle Exposed: No Joint Exposed: No Bone Exposed: No Treatment Notes Wound #6 (Gluteus) Wound Laterality: Right Cleanser Soap and Water Discharge Instruction: Gently cleanse wound with antibacterial soap, rinse and pat dry prior to dressing wounds Vashe 5.8 (oz) Discharge Instruction: Use vashe 5.8 (oz) as directed Peri-Wound Care Topical CALMOSEPTINE Primary Dressing Xeroform-HBD 2x2 (in/in) Discharge Instruction: Apply Xeroform-HBD 2x2 (in/in) as directed (BORDER) Zetuvit Plus Silicone Border Dressing 4x4 (in/in) Secondary Dressing Secured With Compression Wrap Compression Stockings Add-Ons Electronic Signature(s) Signed: 07/29/2023 4:54:56 PM By: Midge Aver MSN RN  CNS WTA Entered By: Midge Aver on 07/29/2023 12:54:00 Jannifer Rodney ([id number]) [id number] Page 9 of 9 -------------------------------------------------------------------------------- Vitals Details Patient Name: Date of Service: [name] [facility] N. 07/29/2023 3:30 PM Medical Record Number: [id number] Patient Account Number: [id number] Date of Birth/Sex: Treating RN: 1981-09-11 (42 y.o. [name] Primary Care Umaima Scholten: Mila Merry Other Clinician: Referring Sapphira Harjo: Treating Shannell Mikkelsen/Extender: Reinaldo Raddle in Treatment: 2 Vital Signs Time Taken: 15:46 Temperature (F): 98.7 Unable to obtain height and weight: Medical Reason Pulse (bpm): 117 Respiratory Rate (breaths/min): 18 Blood Pressure (mmHg): 127/86 Reference Range: 80 - 120 mg / dl Electronic Signature(s) Signed: 07/29/2023 4:01:18 PM By: Midge Aver MSN RN CNS WTA Entered By: Midge Aver on 07/29/2023 13:01:17

## 2023-07-30 ENCOUNTER — Telehealth: Payer: Self-pay | Admitting: Family Medicine

## 2023-07-30 ENCOUNTER — Other Ambulatory Visit: Payer: Self-pay | Admitting: Family Medicine

## 2023-07-30 ENCOUNTER — Encounter: Payer: Self-pay | Admitting: Family Medicine

## 2023-07-30 DIAGNOSIS — R339 Retention of urine, unspecified: Secondary | ICD-10-CM | POA: Diagnosis not present

## 2023-07-30 DIAGNOSIS — G8220 Paraplegia, unspecified: Secondary | ICD-10-CM

## 2023-07-30 NOTE — Telephone Encounter (Signed)
Requested medications are due for refill today.  yes  Requested medications are on the active medications list.  yes  Last refill. 12/12/2022 #30 2 rf  Future visit scheduled.   yes  Notes to clinic.  Refill not delegated.    Requested Prescriptions  Pending Prescriptions Disp Refills   promethazine (PHENERGAN) 25 MG tablet [Pharmacy Med Name: PROMETHAZINE HCL 25 MG TAB] 30 tablet 2    Sig: TAKE 1 TABLET BY MOUTH EVERY 4 HOURS AS NEEDED FOR NAUSEA AND VOMITING     Not Delegated - Gastroenterology: Antiemetics Failed - 07/29/2023  9:06 AM      Failed - This refill cannot be delegated      Passed - Valid encounter within last 6 months    Recent Outpatient Visits           2 months ago Paraplegia [facility])   [place] [facility] Malva Limes, MD   5 months ago Tinea cruris   [place] [facility] Malva Limes, MD   5 months ago Recurrent UTI   [facility] Malva Limes, MD   6 months ago Spasticity   [facility] Malva Limes, MD   6 months ago Sepsis due to urinary tract infection [facility])   [place] [facility] [name], Demetrios Isaacs, MD

## 2023-07-30 NOTE — Telephone Encounter (Signed)
[facility] is requesting prior authorization Key: [id number] Methadone HCI 10MG  tablets

## 2023-08-02 ENCOUNTER — Telehealth (INDEPENDENT_AMBULATORY_CARE_PROVIDER_SITE_OTHER): Payer: Medicare HMO | Admitting: Family Medicine

## 2023-08-02 ENCOUNTER — Encounter: Payer: Self-pay | Admitting: Family Medicine

## 2023-08-02 ENCOUNTER — Telehealth: Payer: Self-pay | Admitting: Family Medicine

## 2023-08-02 VITALS — Ht 76.0 in | Wt 172.0 lb

## 2023-08-02 DIAGNOSIS — B356 Tinea cruris: Secondary | ICD-10-CM

## 2023-08-02 DIAGNOSIS — S30811A Abrasion of abdominal wall, initial encounter: Secondary | ICD-10-CM

## 2023-08-02 DIAGNOSIS — L03818 Cellulitis of other sites: Secondary | ICD-10-CM

## 2023-08-02 MED ORDER — SILVER SULFADIAZINE 1 % EX CREA: TOPICAL_CREAM | CUTANEOUS | 2 refills | Status: DC

## 2023-08-02 MED ORDER — CIPROFLOXACIN HCL 500 MG PO TABS: 500.0000 mg | ORAL_TABLET | Freq: Two times a day (BID) | ORAL | 0 refills | Status: AC

## 2023-08-02 NOTE — Telephone Encounter (Signed)
Sherri calling from [facility] is calling to request  the total daily supply for insurance purposes for  silver sulfADIAZINE (SSD) 1 % cream [[id number]]  [facility] - [place], [place] - [address] Phone: [phone]  Fax: [phone]

## 2023-08-05 MED ORDER — SILVER SULFADIAZINE 1 % EX CREA: TOPICAL_CREAM | CUTANEOUS | 2 refills | Status: DC

## 2023-08-06 NOTE — Progress Notes (Signed)
MyChart Video Visit    Virtual Visit via Video Note   This format is felt to be most appropriate for this patient at this time. Physical exam was limited by quality of the video and audio technology used for the visit.   Patient location: home Provider location: [facility]  I discussed the limitations of evaluation and management by telemedicine and the availability of in person appointments. The patient expressed understanding and agreed to proceed.  Patient: Justin [name]   DOB: 12/25/1980   42 y.o. Male  MRN: [id number] Visit Date: 08/02/2023  Today's healthcare provider: Mila Merry, MD   No chief complaint on file.  Subjective    Discussed the use of AI scribe software for clinical note transcription with the patient, who gave verbal consent to proceed.  History of Present Illness   The patient, a wheelchair user, presents with recent skin abrasions in the groin and buttock area due to friction with their new wheelchair. They report that the wheelchair has a divider to prevent their leg from rubbing against the wheel, but they have experienced difficulty when transferring from their car to the wheelchair, leading to skin damage. The patient notes that the abrasions are not severe, only removing the top layer of skin, but they are concerned about potential infection.  The patient has been self-treating the abrasions with a mixture of sulfadiazine and a desiccant, which they typically use to manage chafing caused by epimide use for bladder infection prevention. They report that the abrasions have improved since starting this treatment. However, they have also been taking Cipro, an antibiotic, for the past three days, which they believe has helped reduce swelling. They have now run out of this medication.  The patient requests a refill of Silvadene (sulfadiazine) and additional Cipro to continue their treatment. They use [facility] for their prescriptions.        Medications: Outpatient Medications Prior to Visit  Medication Sig   albuterol (VENTOLIN HFA) 108 (90 Base) MCG/ACT inhaler Inhale 2 puffs into the lungs every 6 (six) hours as needed for wheezing or shortness of breath.   alprazolam (XANAX) 2 MG tablet TAKE ONE TABLET EVERY SIX HOURS IF NEEDED FOR ANXIETY   amphetamine-dextroamphetamine (ADDERALL) 15 MG tablet Take 1 tablet by mouth 2 (two) times daily.   bacitracin ointment Apply topically 2 (two) times daily.   baclofen (LIORESAL) 20 MG tablet Take 1 tablet (20 mg total) by mouth 4 (four) times daily as needed for muscle spasms.   dantrolene (DANTRIUM) 100 MG capsule Take 1 capsule (100 mg total) by mouth 3 (three) times daily.   docusate sodium (COLACE) 250 MG capsule Take 1 capsule (250 mg total) by mouth 4 (four) times daily as needed.   fesoterodine (TOVIAZ) 8 MG TB24 tablet TAKE 1 TABLET BY MOUTH DAILY   flavoxATE (URISPAS) 100 MG tablet Take 1 tablet (100 mg total) by mouth 3 (three) times daily as needed for bladder spasms.   leptospermum manuka honey (MEDIHONEY) PSTE paste Apply 1 Application topically daily.   levothyroxine (SYNTHROID) 88 MCG tablet TAKE ONE TABLET BY MOUTH EVERY DAY   linaclotide (LINZESS) 145 MCG CAPS capsule Take 145 mcg by mouth daily before breakfast.   lubiprostone (AMITIZA) 24 MCG capsule Take 1 capsule (24 mcg total) by mouth 2 (two) times daily with a meal. For opioid and Spinal cord injury based constipation-   meclizine (ANTIVERT) 25 MG tablet Take by mouth.   methadone (DOLOPHINE) 10 MG tablet  Take 2 tablets (20 mg total) by mouth every 6 (six) hours.   Mineral Oil OIL daily.   mupirocin ointment (BACTROBAN) 2 % Apply 1 Application topically 2 (two) times daily.   naproxen sodium (ANAPROX) 550 MG tablet TAKE 1 TABLET BY MOUTH 2 TIMES DAILY WITH A MEAL.   omeprazole (PRILOSEC) 20 MG capsule TAKE 1 CAPSULE BY MOUTH ONCE DAILY   oxybutynin (DITROPAN) 5 MG tablet Take 5 mg by mouth daily as needed.    oxyCODONE (ROXICODONE) 15 MG immediate release tablet TAKE ONE TABLET BY MOUTH EVERY 4-5 HOURSAS NEEDED FOR PAIN. NO MORE THAN 5 IN A DAY.   pregabalin (LYRICA) 150 MG capsule Take one tablet two or three times a day as needed   promethazine (PHENERGAN) 25 MG suppository Place 1 suppository (25 mg total) rectally every 6 (six) hours as needed for nausea or vomiting.   promethazine (PHENERGAN) 25 MG tablet TAKE 1 TABLET BY MOUTH EVERY 4 HOURS AS NEEDED FOR NAUSEA AND VOMITING   sildenafil (REVATIO) 20 MG tablet TAKE 3 TO 5 TABLETS 2 HOURS BEFORE INTERCOURSE ON EMPTY STOMACH. DO NOT TAKE WITH NITRATES.   sodium fluoride (DENTAGEL) 1.1 % GEL dental gel as needed.   tacrolimus (PROTOPIC) 0.1 % ointment After dressing change apply topically daily to rash at buttock and cover with bandage   tiZANidine (ZANAFLEX) 4 MG tablet TAKE 2 TABLETS BY MOUTH 4 TIMES DAILY   triamcinolone ointment (KENALOG) 0.5 % Apply twice a day to affected area on left hand   [DISCONTINUED] silver sulfADIAZINE (SSD) 1 % cream APPLY TO AFFECTED AREAS TOPICALLY TWICE DAILY   No facility-administered medications prior to visit.    Review of Systems  Constitutional:  Negative for appetite change, chills and fever.  Respiratory:  Negative for chest tightness, shortness of breath and wheezing.   Cardiovascular:  Negative for chest pain and palpitations.  Gastrointestinal:  Negative for abdominal pain, nausea and vomiting.        Objective    Ht 6\' 4"  (1.93 m)   Wt 172 lb (78 kg)   BMI 20.94 kg/m     Physical Exam   SKIN: Abrasion on the backside, top layer of skin removed. Abrasion on the crotch, similar presentation, top layer of skin removed. Bruising on the crotch area.      Assessment & Plan        Skin abrasion in the groin Patient sustained a skin abrasion in the groin area due to friction with his wheelchair. He has been applying a mixture of sulfadiazine and a desiccant. He has also been taking Cipro for  three days. -Refill Silvadene (sulfadiazine) cream. -Prescribe additional Cipro for 4-5 days. -Send prescriptions to [facility].    No follow-ups on file.     I discussed the assessment and treatment plan with the patient. The patient was provided an opportunity to ask questions and all were answered. The patient agreed with the plan and demonstrated an understanding of the instructions.   The patient was advised to call back or seek an in-person evaluation if the symptoms worsen or if the condition fails to improve as anticipated.  I provided 9 minutes of non-face-to-face time during this encounter.    Mila Merry, MD [facility] [phone] (phone) [phone] (fax)  [facility]

## 2023-08-06 NOTE — Telephone Encounter (Signed)
PA started and sent to Medicare Part D.

## 2023-08-12 ENCOUNTER — Encounter: Payer: Medicare HMO | Admitting: Physician Assistant

## 2023-08-12 DIAGNOSIS — G8222 Paraplegia, incomplete: Secondary | ICD-10-CM | POA: Diagnosis not present

## 2023-08-12 DIAGNOSIS — L89312 Pressure ulcer of right buttock, stage 2: Secondary | ICD-10-CM | POA: Diagnosis not present

## 2023-08-12 DIAGNOSIS — J45909 Unspecified asthma, uncomplicated: Secondary | ICD-10-CM | POA: Diagnosis not present

## 2023-08-12 DIAGNOSIS — M1990 Unspecified osteoarthritis, unspecified site: Secondary | ICD-10-CM | POA: Diagnosis not present

## 2023-08-12 NOTE — Progress Notes (Signed)
MERT, SUGIMOTO ([id number]) [id number] Page 1 of 8 Visit Report for 08/12/2023 Arrival Information Details Patient Name: Date of Service: [name] [facility] N. 08/12/2023 3:30 PM Medical Record Number: [id number] Patient Account Number: [id number] Date of Birth/Sex: Treating RN: 05/24/1981 (42 y.o. [name] Primary Care Isabellarose Kope: Mila Merry Other Clinician: Referring Valerian Jewel: Treating Keyaan Lederman/Extender: Reinaldo Raddle in Treatment: 4 Visit Information History Since Last Visit Added or deleted any medications: No Patient Arrived: Wheel Chair Any new allergies or adverse reactions: No Arrival Time: 15:35 Has Dressing in Place as Prescribed: Yes Accompanied By: wife Pain Present Now: No Transfer Assistance: None Patient Identification Verified: Yes Secondary Verification Process Completed: Yes Patient Requires Transmission-Based Precautions: No Patient Has Alerts: No Electronic Signature(s) Signed: 08/12/2023 4:30:50 PM By: Midge Aver MSN RN CNS WTA Entered By: Midge Aver on 08/12/2023 12:37:54 -------------------------------------------------------------------------------- Clinic Level of Care Assessment Details Patient Name: Date of Service: [name] [facility] N. 08/12/2023 3:30 PM Medical Record Number: [id number] Patient Account Number: [id number] Date of Birth/Sex: Treating RN: August 24, 1981 (42 y.o. [name] Primary Care Henna Derderian: Mila Merry Other Clinician: Referring Samirah Scarpati: Treating Johngabriel Verde/Extender: Reinaldo Raddle in Treatment: 4 Clinic Level of Care Assessment Items TOOL 4 Quantity Score X- 1 0 Use when only an EandM is performed on FOLLOW-UP visit ASSESSMENTS - Nursing Assessment / Reassessment X- 1 10 Reassessment of Co-morbidities (includes updates in patient status) X- 1 5 Reassessment of Adherence to Treatment Plan ASSESSMENTS - Wound and Skin A ssessment /  Reassessment X - Simple Wound Assessment / Reassessment - one wound 1 5 []  - 0 Complex Wound Assessment / Reassessment - multiple wounds WADIE, RODKEY ([id number]) [phone] Page 2 of 8 []  - 0 Dermatologic / Skin Assessment (not related to wound area) ASSESSMENTS - Focused Assessment []  - 0 Circumferential Edema Measurements - multi extremities []  - 0 Nutritional Assessment / Counseling / Intervention []  - 0 Lower Extremity Assessment (monofilament, tuning fork, pulses) []  - 0 Peripheral Arterial Disease Assessment (using hand held doppler) ASSESSMENTS - Ostomy and/or Continence Assessment and Care []  - 0 Incontinence Assessment and Management []  - 0 Ostomy Care Assessment and Management (repouching, etc.) PROCESS - Coordination of Care X - Simple Patient / Family Education for ongoing care 1 15 []  - 0 Complex (extensive) Patient / Family Education for ongoing care X- 1 10 Staff obtains Chiropractor, Records, T Results / Process Orders est []  - 0 Staff telephones HHA, Nursing Homes / Clarify orders / etc []  - 0 Routine Transfer to another Facility (non-emergent condition) []  - 0 Routine Hospital Admission (non-emergent condition) []  - 0 New Admissions / Manufacturing engineer / Ordering NPWT Apligraf, etc. , []  - 0 Emergency Hospital Admission (emergent condition) X- 1 10 Simple Discharge Coordination []  - 0 Complex (extensive) Discharge Coordination PROCESS - Special Needs []  - 0 Pediatric / Minor Patient Management []  - 0 Isolation Patient Management []  - 0 Hearing / Language / Visual special needs []  - 0 Assessment of Community assistance (transportation, D/C planning, etc.) []  - 0 Additional assistance / Altered mentation []  - 0 Support Surface(s) Assessment (bed, cushion, seat, etc.) INTERVENTIONS - Wound Cleansing / Measurement X - Simple Wound Cleansing - one wound 1 5 []  - 0 Complex Wound Cleansing - multiple wounds X-  1 5 Wound Imaging (photographs - any number of wounds) []  - 0 Wound Tracing (instead of photographs) X- 1 5 Simple Wound Measurement - one wound []  -  0 Complex Wound Measurement - multiple wounds INTERVENTIONS - Wound Dressings X - Small Wound Dressing one or multiple wounds 1 10 []  - 0 Medium Wound Dressing one or multiple wounds []  - 0 Large Wound Dressing one or multiple wounds X- 1 5 Application of Medications - topical []  - 0 Application of Medications - injection INTERVENTIONS - Miscellaneous []  - 0 External ear exam []  - 0 Specimen Collection (cultures, biopsies, blood, body fluids, etc.) []  - 0 Specimen(s) / Culture(s) sent or taken to Lab for analysis JESSUS, TWOHIG ([id number]) [phone].pdf Page 3 of 8 []  - 0 Patient Transfer (multiple staff / Nurse, adult / Similar devices) []  - 0 Simple Staple / Suture removal (25 or less) []  - 0 Complex Staple / Suture removal (26 or more) []  - 0 Hypo / Hyperglycemic Management (close monitor of Blood Glucose) []  - 0 Ankle / Brachial Index (ABI) - do not check if billed separately X- 1 5 Vital Signs Has the patient been seen at the hospital within the last three years: Yes Total Score: 90 Level Of Care: New/Established - Level 3 Electronic Signature(s) Signed: 08/12/2023 4:30:50 PM By: Midge Aver MSN RN CNS WTA Entered By: Midge Aver on 08/12/2023 13:01:22 -------------------------------------------------------------------------------- Encounter Discharge Information Details Patient Name: Date of Service: Gaylyn Rong Justin [name] [name] 08/12/2023 3:30 PM Medical Record Number: [id number] Patient Account Number: [id number] Date of Birth/Sex: Treating RN: [birth year] (42 y.o. [name] Primary Care Lige Lakeman: Mila Merry Other Clinician: Referring Krishana Lutze: Treating Maxfield Gildersleeve/Extender: Reinaldo Raddle in Treatment: 4 Encounter Discharge Information Items Discharge  Condition: Stable Ambulatory Status: Wheelchair Discharge Destination: Home Transportation: Private Auto Accompanied By: wife Schedule Follow-up Appointment: No Clinical Summary of Care: Electronic Signature(s) Signed: 08/12/2023 4:30:50 PM By: Midge Aver MSN RN CNS WTA Entered By: Midge Aver on 08/12/2023 13:04:09 -------------------------------------------------------------------------------- Lower Extremity Assessment Details Patient Name: Date of Service: [name] [facility] N. 08/12/2023 3:30 PM Medical Record Number: [id number] Patient Account Number: [id number] Date of Birth/Sex: Treating RN: December 23, 1980 (42 y.o. [name] Primary Care Dannon Nguyenthi: Mila Merry Other Clinician: Referring Everardo Voris: Treating Saniyyah Elster/Extender: Wyley, Yung, [place] ([id number]) [id number] Page 4 of 8 Weeks in Treatment: 4 Electronic Signature(s) Signed: 08/12/2023 4:30:50 PM By: Midge Aver MSN RN CNS WTA Entered By: Midge Aver on 08/12/2023 12:45:08 -------------------------------------------------------------------------------- Multi Wound Chart Details Patient Name: Date of Service: Gaylyn Rong Justin [name] [facility] N. 08/12/2023 3:30 PM Medical Record Number: [id number] Patient Account Number: [id number] Date of Birth/Sex: Treating RN: 1981/07/17 (42 y.o. [name] Primary Care Amabel Stmarie: Mila Merry Other Clinician: Referring Loreda Silverio: Treating Hersey Maclellan/Extender: Reinaldo Raddle in Treatment: 4 Vital Signs Height(in): Pulse(bpm): 94 Weight(lbs): Blood Pressure(mmHg): 133/93 Body Mass Index(BMI): Temperature(F): 98.2 Respiratory Rate(breaths/min): 18 [6:Photos:] [N/A:N/A] Right Gluteus N/A N/A Wound Location: Gradually Appeared N/A N/A Wounding Event: Pressure Ulcer N/A N/A Primary Etiology: Asthma, Pneumothorax, Arrhythmia, N/A N/A Comorbid History: History of pressure wounds, Osteoarthritis,  Paraplegia 07/09/2023 N/A N/A Date Acquired: 4 N/A N/A Weeks of Treatment: Open N/A N/A Wound Status: No N/A N/A Wound Recurrence: 0.5x0.5x0.1 N/A N/A Measurements L x W x D (cm) 0.196 N/A N/A A (cm) : rea 0.02 N/A N/A Volume (cm) : -66.10% N/A N/A % Reduction in A rea: -66.70% N/A N/A % Reduction in Volume: Category/Stage II N/A N/A Classification: None Present N/A N/A Exudate A mount: Medium (34-66%) N/A N/A Granulation A mount: Pale N/A N/A Granulation Quality: None Present (0%) N/A N/A Necrotic A  mount: Fascia: No N/A N/A Exposed Structures: Fat Layer (Subcutaneous Tissue): No Tendon: No Muscle: No Joint: No Bone: No Small (1-33%) N/A N/A Epithelialization: Treatment Notes RIDGE, DUMOND ([id number]) [id number] Page 5 of 8 Electronic Signature(s) Signed: 08/12/2023 4:30:50 PM By: Midge Aver MSN RN CNS WTA Entered By: Midge Aver on 08/12/2023 12:45:14 -------------------------------------------------------------------------------- Multi-Disciplinary Care Plan Details Patient Name: Date of Service: [name] [facility] N. 08/12/2023 3:30 PM Medical Record Number: [id number] Patient Account Number: [id number] Date of Birth/Sex: Treating RN: 21-Mar-1981 (42 y.o. [name] Primary Care Notnamed Scholz: Mila Merry Other Clinician: Referring Vergene Marland: Treating Aarica Wax/Extender: Reinaldo Raddle in Treatment: 4 Active Inactive Electronic Signature(s) Signed: 08/12/2023 4:30:50 PM By: Midge Aver MSN RN CNS WTA Entered By: Midge Aver on 08/12/2023 13:01:50 -------------------------------------------------------------------------------- Pain Assessment Details Patient Name: Date of Service: Mariel Sleet [facility] N. 08/12/2023 3:30 PM Medical Record Number: [id number] Patient Account Number: [id number] Date of Birth/Sex: Treating RN: 19-Jul-1981 (42 y.o. [name] Primary Care Dorian Renfro: Mila Merry  Other Clinician: Referring Smriti Barkow: Treating Kyen Taite/Extender: Reinaldo Raddle in Treatment: 4 Active Problems Location of Pain Severity and Description of Pain Patient Has Paino No Site Locations [place], [place] ([id number]) [id number] Page 6 of 8 Pain Management and Medication Current Pain Management: Electronic Signature(s) Signed: 08/12/2023 4:30:50 PM By: Midge Aver MSN RN CNS WTA Entered By: Midge Aver on 08/12/2023 12:40:50 -------------------------------------------------------------------------------- Patient/Caregiver Education Details Patient Name: Date of Service: HA Lovey Newcomer 11/25/2024andnbsp3:30 PM Medical Record Number: [id number] Patient Account Number: [id number] Date of Birth/Gender: Treating RN: February 24, 1981 (42 y.o. [name] Primary Care Physician: Mila Merry Other Clinician: Referring Physician: Treating Physician/Extender: Reinaldo Raddle in Treatment: 4 Education Assessment Education Provided To: Patient Education Topics Provided Discharge Packet: Handouts: Pressure Injury: Prevention and Offloading Methods: Explain/Verbal Responses: State content correctly Electronic Signature(s) Signed: 08/12/2023 4:30:50 PM By: Midge Aver MSN RN CNS WTA Entered By: Midge Aver on 08/12/2023 13:02:26 ZATHAN, BERTH ([id number]) [id number] Page 7 of 8 -------------------------------------------------------------------------------- Wound Assessment Details Patient Name: Date of Service: [name] [facility] N. 08/12/2023 3:30 PM Medical Record Number: [id number] Patient Account Number: [id number] Date of Birth/Sex: Treating RN: July 12, 1981 (42 y.o. [name] Primary Care Kalani Sthilaire: Mila Merry Other Clinician: Referring Joani Cosma: Treating Sabryna Lahm/Extender: Reinaldo Raddle in Treatment: 4 Wound Status Wound Number: 6  Primary Pressure Ulcer Etiology: Wound Location: Right Gluteus Wound Healed - Epithelialized Wounding Event: Gradually Appeared Status: Date Acquired: 07/09/2023 Comorbid Asthma, Pneumothorax, Arrhythmia, History of pressure Weeks Of Treatment: 4 History: wounds, Osteoarthritis, Paraplegia Clustered Wound: No Photos Wound Measurements Length: (cm) 0 Width: (cm) 0 Depth: (cm) 0 Area: (cm) Volume: (cm) % Reduction in Area: 100% % Reduction in Volume: 100% Epithelialization: Small (1-33%) 0 0 Wound Description Classification: Category/Stage II Exudate Amount: None Present Foul Odor After Cleansing: No Slough/Fibrino No Wound Bed Granulation Amount: Medium (34-66%) Exposed Structure Granulation Quality: Pale Fascia Exposed: No Necrotic Amount: None Present (0%) Fat Layer (Subcutaneous Tissue) Exposed: No Tendon Exposed: No Muscle Exposed: No Joint Exposed: No Bone Exposed: No Treatment Notes Wound #6 (Gluteus) Wound Laterality: Right Cleanser Peri-Wound Care Topical Primary Dressing AMARDEEP, ZERBA ([id number]) [id number] Page 8 of 8 Secondary Dressing Secured With Compression Wrap Compression Stockings Add-Ons Electronic Signature(s) Signed: 08/12/2023 4:30:50 PM By: Midge Aver MSN RN CNS WTA Entered By: Midge Aver on 08/12/2023 13:03:26 -------------------------------------------------------------------------------- Vitals Details Patient Name: Date of Service: [name] [facility]  N. 08/12/2023 3:30 PM Medical Record Number: [id number] Patient Account Number: [id number] Date of Birth/Sex: Treating RN: Oct 20, 1980 (42 y.o. [name] Primary Care Cortez Flippen: Mila Merry Other Clinician: Referring Tyrihanna Wingert: Treating Quiara Killian/Extender: Reinaldo Raddle in Treatment: 4 Vital Signs Time Taken: 14:39 Temperature (F): 98.2 Pulse (bpm): 94 Respiratory Rate (breaths/min): 18 Blood Pressure (mmHg):  133/93 Reference Range: 80 - 120 mg / dl Electronic Signature(s) Signed: 08/12/2023 4:30:50 PM By: Midge Aver MSN RN CNS WTA Entered By: Midge Aver on 08/12/2023 12:40:44

## 2023-08-12 NOTE — Progress Notes (Addendum)
YAKOV, LAUGHTON ([id number]) [id number] Page 1 of 6 Visit Report for 08/12/2023 Chief Complaint Document Details Patient Name: Date of Service: Justin [name] [facility] N. 08/12/2023 3:30 PM Medical Record Number: [id number] Patient Account Number: [id number] Date of Birth/Sex: Treating RN: 04-04-1981 (42 y.o. [name] Primary Care Provider: Mila Merry Other Clinician: Referring Provider: Treating Provider/Extender: Reinaldo Raddle in Treatment: 4 Information Obtained from: Patient Chief Complaint Sacral/gluteal pressure ulcer Electronic Signature(s) Signed: 08/12/2023 3:49:20 PM By: Allen Derry PA-C Entered By: Allen Derry on 08/12/2023 12:49:20 -------------------------------------------------------------------------------- HPI Details Patient Name: Date of Service: [name] [facility] N. 08/12/2023 3:30 PM Medical Record Number: [id number] Patient Account Number: [id number] Date of Birth/Sex: Treating RN: [birth year] (42 y.o. [name] Primary Care Provider: Mila Merry Other Clinician: Referring Provider: Treating Provider/Extender: Reinaldo Raddle in Treatment: 4 History of Present Illness HPI Description: 06/06/2021 upon evaluation today patient presents for repeat evaluation here in the clinic although its actually been a number of years since he was seen here. This was actually in 2016. He saw Dr. Lawerance Bach at that time. Nonetheless the patient tells me that based on where things stand currently he is done fairly well he has intermittently had issues with the gluteal region but nothing sustained like what he has right now the just does not seem to want to heal. He has been using Silvadene and cleaning with peroxide. Fortunately there does not appear to be any signs of infection which is great news and this is fairly superficial. The good news is from a healing standpoint this should actually probably  heal fairly quickly the bad news is with him being a paraplegic even incomplete he has some movement and he has excellent upper body strength but nonetheless he still can have to be very careful about pressure and appropriate offloading and this was the majority of the conversation we had today. Readmission: 07-09-2023 upon evaluation today patient appears to be doing decently well overall. This is a gentleman that I have seen several times over the past couple of years since 2022. With that being said he typically takes excellent care of himself and therefore wounds are not that frequent for him which is excellent news. I do not see any signs of active infection at this time which is good news he does have a small area which is mainly crust and scab covered at this point and that is something that we are trying to help to improve. He has been using some Silvadene cream here in the area which in the past we have discussed as well but he does tend to feel like that does well for him. Nonetheless right now he tells me just been using a Band-Aid over it when it was open and just to make sure that nothing got rubbed he is continued that Band-Aid without any actual dressing underneath. Justin [name], Justin [name] ([id number]) [id number] Page 2 of 6 Patient's past medical history really has not changed is fairly healthy beyond what he has going on with the wounds. 07-16-2023 upon evaluation today patient appears to be doing well currently in regard to the wound which is actually showing signs of improvement. I am actually very pleased with where things stand today. I do not see any signs of active infection locally or systemically at this time. 07-29-2023 upon evaluation today patient appears to be doing well currently in regard to his wound. Has been tolerating the dressing changes without complication. Fortunately  I do not see any signs of active infection locally or systemically which  is great news and in general I do believe that we are making really good headway here towards closure. No fevers, chills, nausea, vomiting, or diarrhea. 08-12-2023 upon evaluation today patient appears to be doing excellent in fact he appears to be completely healed based on what we are seeing. Fortunately I do not see any signs of active infection at this time. No fevers, chills, nausea, vomiting, or diarrhea. Electronic Signature(s) Signed: 08/12/2023 3:59:59 PM By: Allen Derry PA-C Entered By: Allen Derry on 08/12/2023 12:59:59 -------------------------------------------------------------------------------- Physical Exam Details Patient Name: Date of Service: [name] [facility] N. 08/12/2023 3:30 PM Medical Record Number: [id number] Patient Account Number: [id number] Date of Birth/Sex: Treating RN: 01/31/1981 (42 y.o. [name] Primary Care Provider: Mila Merry Other Clinician: Referring Provider: Treating Provider/Extender: Reinaldo Raddle in Treatment: 4 Constitutional Well-nourished and well-hydrated in no acute distress. Respiratory normal breathing without difficulty. Psychiatric this patient is able to make decisions and demonstrates good insight into disease process. Alert and Oriented x 3. pleasant and cooperative. Notes Upon inspection patient's wound bed actually showed signs of good granulation and epithelization at this point. Fortunately I do not see any signs of worsening at this time and overall I do believe that the patient is completely healed at this point. Electronic Signature(s) Signed: 08/12/2023 4:00:25 PM By: Allen Derry PA-C Entered By: Allen Derry on 08/12/2023 13:00:25 -------------------------------------------------------------------------------- Physician Orders Details Patient Name: Date of Service: [name] [facility] N. 08/12/2023 3:30 PM Medical Record Number: [id number] Patient Account Number: [id number] Date of  Birth/Sex: Treating RN: December 29, 1980 (42 y.o. [name] Primary Care Provider: Mila Merry Other Clinician: GRACESON, Justin [name] ([id number]) [id number] Page 3 of 6 Referring Provider: Treating Provider/Extender: Reinaldo Raddle in Treatment: 4 Verbal / Phone Orders: No Diagnosis Coding ICD-10 Coding Code Description G82.22 Paraplegia, incomplete L89.312 Pressure ulcer of right buttock, stage 2 Discharge From [facility] Services Discharge from Wound Care Center Treatment Complete Wound Treatment Electronic Signature(s) Signed: 08/12/2023 4:28:31 PM By: Allen Derry PA-C Signed: 08/12/2023 4:30:50 PM By: Midge Aver MSN RN CNS WTA Entered By: Midge Aver on 08/12/2023 12:59:20 -------------------------------------------------------------------------------- Problem List Details Patient Name: Date of Service: Justin Rong [name] [name] 08/12/2023 3:30 PM Medical Record Number: [id number] Patient Account Number: [id number] Date of Birth/Sex: Treating RN: Feb 27, 1981 (42 y.o. [name] Primary Care Provider: Mila Merry Other Clinician: Referring Provider: Treating Provider/Extender: Reinaldo Raddle in Treatment: 4 Active Problems ICD-10 Encounter Code Description Active Date MDM Diagnosis G82.22 Paraplegia, incomplete 07/09/2023 No Yes L89.312 Pressure ulcer of right buttock, stage 2 07/09/2023 No Yes Inactive Problems Resolved Problems Electronic Signature(s) Signed: 08/12/2023 3:49:17 PM By: Allen Derry PA-C Entered By: Allen Derry on 08/12/2023 12:49:16 Jannifer Rodney ([id number]) [id number] Page 4 of 6 -------------------------------------------------------------------------------- Progress Note Details Patient Name: Date of Service: [name] [facility] N. 08/12/2023 3:30 PM Medical Record Number: [id number] Patient Account Number: [id number] Date of Birth/Sex: Treating  RN: 29-Sep-1980 (41 y.o. [name] Primary Care Provider: Mila Merry Other Clinician: Referring Provider: Treating Provider/Extender: Reinaldo Raddle in Treatment: 4 Subjective Chief Complaint Information obtained from Patient Sacral/gluteal pressure ulcer History of Present Illness (HPI) 06/06/2021 upon evaluation today patient presents for repeat evaluation here in the clinic although its actually been a number of years since he was seen here. This was actually in 2016. He saw  Dr. Lawerance Bach at that time. Nonetheless the patient tells me that based on where things stand currently he is done fairly well he has intermittently had issues with the gluteal region but nothing sustained like what he has right now the just does not seem to want to heal. He has been using Silvadene and cleaning with peroxide. Fortunately there does not appear to be any signs of infection which is great news and this is fairly superficial. The good news is from a healing standpoint this should actually probably heal fairly quickly the bad news is with him being a paraplegic even incomplete he has some movement and he has excellent upper body strength but nonetheless he still can have to be very careful about pressure and appropriate offloading and this was the majority of the conversation we had today. Readmission: 07-09-2023 upon evaluation today patient appears to be doing decently well overall. This is a gentleman that I have seen several times over the past couple of years since 2022. With that being said he typically takes excellent care of himself and therefore wounds are not that frequent for him which is excellent news. I do not see any signs of active infection at this time which is good news he does have a small area which is mainly crust and scab covered at this point and that is something that we are trying to help to improve. He has been using some Silvadene cream here in the area which  in the past we have discussed as well but he does tend to feel like that does well for him. Nonetheless right now he tells me just been using a Band-Aid over it when it was open and just to make sure that nothing got rubbed he is continued that Band-Aid without any actual dressing underneath. Patient's past medical history really has not changed is fairly healthy beyond what he has going on with the wounds. 07-16-2023 upon evaluation today patient appears to be doing well currently in regard to the wound which is actually showing signs of improvement. I am actually very pleased with where things stand today. I do not see any signs of active infection locally or systemically at this time. 07-29-2023 upon evaluation today patient appears to be doing well currently in regard to his wound. Has been tolerating the dressing changes without complication. Fortunately I do not see any signs of active infection locally or systemically which is great news and in general I do believe that we are making really good headway here towards closure. No fevers, chills, nausea, vomiting, or diarrhea. 08-12-2023 upon evaluation today patient appears to be doing excellent in fact he appears to be completely healed based on what we are seeing. Fortunately I do not see any signs of active infection at this time. No fevers, chills, nausea, vomiting, or diarrhea. Objective Constitutional Well-nourished and well-hydrated in no acute distress. Vitals Time Taken: 2:39 PM, Temperature: 98.2 F, Pulse: 94 bpm, Respiratory Rate: 18 breaths/min, Blood Pressure: 133/93 mmHg. Respiratory normal breathing without difficulty. Psychiatric this patient is able to make decisions and demonstrates good insight into disease process. Alert and Oriented x 3. pleasant and cooperative. General Notes: Upon inspection patient's wound bed actually showed signs of good granulation and epithelization at this point. Fortunately I do not see  any signs of worsening at this time and overall I do believe that the patient is completely healed at this point. Integumentary (Hair, Skin) Justin [name], Justin [name] ([id number]) [id number] Page 5 of 6 Wound #6  status is Open. Original cause of wound was Gradually Appeared. The date acquired was: 07/09/2023. The wound has been in treatment 4 weeks. The wound is located on the Right Gluteus. The wound measures 0.5cm length x 0.5cm width x 0.1cm depth; 0.196cm^2 area and 0.02cm^3 volume. There is a none present amount of drainage noted. There is medium (34-66%) pale granulation within the wound bed. There is no necrotic tissue within the wound bed. Assessment Active Problems ICD-10 Paraplegia, incomplete Pressure ulcer of right buttock, stage 2 Plan Discharge From [facility] Services: Discharge from Wound Care Center Treatment Complete 1. I would recommend that we have the patient going continue to monitor for any signs of infection or worsening. Based on what I see I do believe that we are doing really well currently as far as healing is concerned. 2. I am going to recommend patient should continue to monitor for any signs of infection and if anything changes he knows to let me know. Being that he is healed working to go ahead and discharge as of today. Will see him back for follow-up visit as needed. Electronic Signature(s) Signed: 08/12/2023 4:01:00 PM By: Allen Derry PA-C Entered By: Allen Derry on 08/12/2023 13:01:00 -------------------------------------------------------------------------------- SuperBill Details Patient Name: Date of Service: [name] [name] 08/12/2023 Medical Record Number: [id number] Patient Account Number: [id number] Date of Birth/Sex: Treating RN: [birth year] (42 y.o. [name] Primary Care Provider: Mila Merry Other Clinician: Referring Provider: Treating Provider/Extender: Reinaldo Raddle in Treatment:  4 Diagnosis Coding ICD-10 Codes Code Description G82.22 Paraplegia, incomplete L89.312 Pressure ulcer of right buttock, stage 2 Facility Procedures : CPT4 Code: [id number] Description: 99213 - WOUND CARE VISIT-LEV 3 EST PT Modifier: Quantity: 1 Physician Procedures : CPT4 Code Description Modifier [id number] 99213 - WC PHYS LEVEL 3 - EST PT Justin [name], Justin [name] ([id number]) [id number] Pag Quantity: 1 e 6 of 6 : ICD-10 Diagnosis Description G82.22 Paraplegia, incomplete L89.312 Pressure ulcer of right buttock, stage 2 Quantity: Electronic Signature(s) Signed: 08/12/2023 4:28:31 PM By: Allen Derry PA-C Signed: 08/12/2023 4:30:50 PM By: Midge Aver MSN RN CNS WTA Previous Signature: 08/12/2023 4:01:08 PM Version By: Allen Derry PA-C Entered By: Midge Aver on 08/12/2023 13:01:32

## 2023-08-19 ENCOUNTER — Encounter: Payer: Self-pay | Admitting: Physical Medicine and Rehabilitation

## 2023-08-19 ENCOUNTER — Encounter: Payer: Medicare HMO | Attending: Physical Medicine and Rehabilitation | Admitting: Physical Medicine and Rehabilitation

## 2023-08-19 VITALS — BP 124/82 | HR 82 | Wt 170.0 lb

## 2023-08-19 DIAGNOSIS — S24109S Unspecified injury at unspecified level of thoracic spinal cord, sequela: Secondary | ICD-10-CM | POA: Diagnosis not present

## 2023-08-19 DIAGNOSIS — R252 Cramp and spasm: Secondary | ICD-10-CM

## 2023-08-19 DIAGNOSIS — K592 Neurogenic bowel, not elsewhere classified: Secondary | ICD-10-CM | POA: Diagnosis not present

## 2023-08-19 DIAGNOSIS — G8220 Paraplegia, unspecified: Secondary | ICD-10-CM

## 2023-08-19 DIAGNOSIS — K5909 Other constipation: Secondary | ICD-10-CM | POA: Diagnosis not present

## 2023-08-19 NOTE — Patient Instructions (Signed)
  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  Here for f/u  of SCI.    Can take Dantrolene 100 mg 3x/day OR 100 mg in Am and 200 mg nightly-   2.  Will need to check LFTs in 3 months.    3.  Getting pain meds from Dr [name]-    4. Movantik could be by why pain higher lately- we will increase Dantrolene to see if helping spasticity helps pain. .    5. Gets baclofen 20 mg 4x/day and Zanaflex 8 mg 4x/day from Dr [name]- con't   6. F/U in 3 months- double appt-  [facility]I

## 2023-08-19 NOTE — Progress Notes (Signed)
Subjective:    Patient ID: Justin [name], male    DOB: Jun 14, 1981, 42 y.o.   MRN: [id number]  HPI   An audio/video tele-health visit is felt to be the most appropriate encounter for this patient at this time. This is a follow up tele-visit via phone. The patient is at home. MD is at office. Prior to scheduling this appointment, our staff discussed the limitations of evaluation and management by telemedicine and the availability of in-person appointments. The patient expressed understanding and agreed to proceed.   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  Here for f/u  of SCI.   Went to see GI doc at [facility]-Dr Jeanice Lim went and prescribed Amitiza- also also prescribed Movantik , and Mestinon  Just now getting all 3 meds on board   Wanting to discuss- pain meds a bit higher- since started Movantik- has been higher- and harder time keeping it under control.   Mestinon will affect swallow- and help his swallow in better results and causes gastric muscles to work. .  Amitiza to put water back in colon and Movantik and helps reduce opioid effects in gut.   Guts working a lot better-  Doing bowel program 1x/day-  Getting better results!    We talked about going up on Dantrolene-  I went up on meds, but doesn't think has increased dantrolene- to 100 mg TID.   Saw Dr Delories Heinz- who is going to refer him to a SCI rehab doctor at [facility].   Wants to go see Cards at [facility]- doctor who focuses on Autonomic dysreflexia.   Pain Inventory Average Pain 5 Pain Right Now 7 My pain is sharp, burning, dull, stabbing, tingling, and aching  In the last 24 hours, has pain interfered with the following? General activity 7 Relation with others 7 Enjoyment of life 7 What TIME of day is your pain at its worst?  night Sleep (in general) Poor  Pain is worse with: some activites and bladder infection Pain improves with: rest, pacing activities, and medication Relief from Meds: 8  Family History  Problem Relation Age of Onset   Other Mother    Hypertension Father    Gout Father    Prostate cancer Neg Hx    Kidney cancer Neg Hx    Social History   Socioeconomic History   Marital status: Married    Spouse name: Not on file   Number of children: Not on file   Years of education: Not on file   Highest education level: Not on file  Occupational History   Occupation: unemployed  Tobacco Use   Smoking status: Former    Passive exposure: Past   Smokeless tobacco: Never   Tobacco comments:    Using Nicorette Gum  Vaping Use   Vaping status: Never Used  Substance and Sexual Activity   Alcohol use: Yes    Alcohol/week: 0.0 standard drinks of alcohol    Comment: occasionaly-rare   Drug use: No   Sexual activity: Not on file  Other Topics Concern   Not on file  Social History Narrative   Not on file   Social Determinants of Health   Financial Resource Strain: Medium Risk (01/15/2023)   Overall Financial Resource Strain (CARDIA)    Difficulty  of Paying Living Expenses: Somewhat hard  Food Insecurity: No Food Insecurity (01/15/2023)   Hunger Vital Sign    Worried About Running Out of Food in the Last Year: Never true    Ran Out of Food in the Last Year: Never true  Transportation Needs: No Transportation Needs (01/15/2023)   PRAPARE - Administrator, Civil Service (Medical): No    Lack of Transportation (Non-Medical): No  Physical Activity: Not on file  Stress: Stress Concern Present (01/15/2023)   [employer] of Occupational Health - Occupational Stress Questionnaire    Feeling of Stress : To some extent  Social Connections: Unknown (02/26/2023)   Received from [facility]   Social Network    Social Network: Not on file  Recent Concern: Social  Connections - Socially Isolated (01/15/2023)   Social Connection and Isolation Panel [NHANES]    Frequency of Communication with Friends and Family: Twice a week    Frequency of Social Gatherings with Friends and Family: Never    Attends Religious Services: Never    Diplomatic Services operational officer: No    Attends Engineer, structural: Never    Marital Status: Married   Past Surgical History:  Procedure Laterality Date   BACK SURGERY  2000   Fracture T9 and L3 with paraplegia   Past Surgical History:  Procedure Laterality Date   BACK SURGERY  2000   Fracture T9 and L3 with paraplegia   Past Medical History:  Diagnosis Date   Arthritis    Asthma    Chronic anxiety    Chronic depression    Chronic pain syndrome    secondary to fracture T9 and L3 with extensive spinal surgeery   Postoperative wound infection of right hip 2012   Rheumatic fever    BP 124/82   Pulse 82   Wt 170 lb (77.1 kg)   BMI 20.69 kg/m   Opioid Risk Score:   Fall Risk Score:  `1  Depression screen [facility] 2/9     05/13/2023    1:34 PM 02/13/2023   10:34 AM 01/15/2023    1:12 PM 01/10/2022    3:36 PM 12/05/2021    4:26 PM 05/10/2017    3:58 PM  Depression screen PHQ 2/9  Decreased Interest 0 0 1 1 3 3   Down, Depressed, Hopeless  2 2  2 2   PHQ - 2 Score 0 2 3 1 5 5   Altered sleeping  3 1 1 3 3   Tired, decreased energy  0 1 1 3 3   Change in appetite  3 1 1 3 3   Feeling bad or failure about yourself   3 1 1 3 3   Trouble concentrating  3 1 1 2 2   Moving slowly or fidgety/restless  0 1 0 0 0  Suicidal thoughts  0 0  0 0  PHQ-9 Score  14 9 6 19 19   Difficult doing work/chores   Somewhat difficult Somewhat difficult -- Very difficult     Review of Systems  Gastrointestinal:  Positive for abdominal pain and constipation.  Musculoskeletal:        Leg pain  Neurological:  Positive for weakness.  All other systems reviewed and are negative.      Objective:   Physical  Exam  webex      Assessment & Plan:    Pt is a 42 yr old male with hx of an incomplete thoracic T9 also had L3 injury as  well-  Paraplegia since March 01 1999-at age 32-  Also has TBI- with cracked skull-   With neurogenic bowel and bladder and spasticity.  Here for f/u  of SCI.    Can take Dantrolene 100 mg 3x/day OR 100 mg in Am and 200 mg nightly-   2.  Will need to check LFTs in 3 months.    3.  Getting pain meds from Dr [name]-    4. Movantik could be by why pain higher lately- we will increase Dantrolene to see if helping spasticity helps pain. .    5. Gets baclofen 20 mg 4x/day and Zanaflex 8 mg 4x/day from Dr [name]- con't   6. F/U in 3 months- double appt-  [facility]   I spent a total of 26   minutes on total care today- >50% coordination of care- due to  discussion with pt about all his appointments since seen me- esp his GI doctor and d/w about Movantik and pain tolerance/regimen.

## 2023-08-21 ENCOUNTER — Other Ambulatory Visit: Payer: Self-pay | Admitting: Family Medicine

## 2023-08-21 DIAGNOSIS — G894 Chronic pain syndrome: Secondary | ICD-10-CM

## 2023-08-23 ENCOUNTER — Other Ambulatory Visit: Payer: Self-pay | Admitting: Family Medicine

## 2023-08-23 ENCOUNTER — Encounter: Payer: Self-pay | Admitting: Family Medicine

## 2023-08-23 ENCOUNTER — Telehealth: Payer: Medicare HMO | Admitting: Family Medicine

## 2023-08-23 DIAGNOSIS — G8929 Other chronic pain: Secondary | ICD-10-CM

## 2023-08-23 DIAGNOSIS — N3000 Acute cystitis without hematuria: Secondary | ICD-10-CM | POA: Diagnosis not present

## 2023-08-23 DIAGNOSIS — F1190 Opioid use, unspecified, uncomplicated: Secondary | ICD-10-CM | POA: Diagnosis not present

## 2023-08-23 DIAGNOSIS — G894 Chronic pain syndrome: Secondary | ICD-10-CM

## 2023-08-23 DIAGNOSIS — F4542 Pain disorder with related psychological factors: Secondary | ICD-10-CM | POA: Diagnosis not present

## 2023-08-23 DIAGNOSIS — K5909 Other constipation: Secondary | ICD-10-CM

## 2023-08-23 DIAGNOSIS — G8220 Paraplegia, unspecified: Secondary | ICD-10-CM

## 2023-08-23 MED ORDER — HYDROMORPHONE HCL 4 MG PO TABS: ORAL_TABLET | ORAL | 0 refills | Status: DC

## 2023-08-25 ENCOUNTER — Encounter: Payer: Self-pay | Admitting: Family Medicine

## 2023-08-25 MED ORDER — METHADONE HCL 10 MG PO TABS: 20.0000 mg | ORAL_TABLET | Freq: Four times a day (QID) | ORAL | 0 refills | Status: DC

## 2023-08-26 ENCOUNTER — Other Ambulatory Visit: Payer: Self-pay | Admitting: Family Medicine

## 2023-08-26 DIAGNOSIS — M25559 Pain in unspecified hip: Secondary | ICD-10-CM

## 2023-08-26 DIAGNOSIS — N3000 Acute cystitis without hematuria: Secondary | ICD-10-CM | POA: Diagnosis not present

## 2023-08-27 LAB — URINALYSIS, ROUTINE W REFLEX MICROSCOPIC
Bilirubin, UA: NEGATIVE
Glucose, UA: NEGATIVE
Ketones, UA: NEGATIVE
Nitrite, UA: NEGATIVE
RBC, UA: NEGATIVE
Specific Gravity, UA: 1.021 (ref 1.005–1.030)
Urobilinogen, Ur: 0.2 mg/dL (ref 0.2–1.0)
pH, UA: 7 (ref 5.0–7.5)

## 2023-08-27 LAB — MICROSCOPIC EXAMINATION
Bacteria, UA: NONE SEEN
Casts: NONE SEEN /[LPF]

## 2023-08-28 LAB — URINE CULTURE: Organism ID, Bacteria: NO GROWTH

## 2023-09-08 NOTE — Progress Notes (Signed)
MyChart Video Visit    Virtual Visit via Video Note   This format is felt to be most appropriate for this patient at this time. Physical exam was limited by quality of the video and audio technology used for the visit.   Patient location: home Provider location: bfp  I discussed the limitations of evaluation and management by telemedicine and the availability of in person appointments. The patient expressed understanding and agreed to proceed.  Patient: Justin [name]   DOB: Jun 18, 1981   42 y.o. Male  MRN: [id number] Visit Date: 08/23/2023  Today's healthcare provider: Mila Merry, MD   No chief complaint on file.  Subjective    Discussed the use of AI scribe software for clinical note transcription with the patient, who gave verbal consent to proceed.  History of Present Illness   The patient, with a history of chronic pain managed with Oxycodone and Methadone, presents with concerns about bowel dysfunction. He reports that Oxycodone significantly slows his bowel motility, leading to hard stools and decreased efficacy of his bowel program. This issue is particularly noticeable when he takes Oxycodone around the time of his bowel program. He is currently on Movantik, a medication that blocks opioid receptors in the gut to prevent opioid-induced constipation, and Amitiza, a medication that promotes bowel movement by increasing fluid secretion. While these medications have been beneficial, the patient notes that his efficacy decreases when Oxycodone is taken.  The patient recalls a previous hospital admission in 2016 when he was managed with Hydromorphone instead of Oxycodone. During this time, he experienced fewer bowel problems and required fewer bowel softeners. He expresses a desire to switch from Oxycodone to Hydromorphone for breakthrough pain management, hoping it will be less impactful on his bowel function.  In addition to his chronic pain and bowel issues, the  patient recently experienced a bladder infection scare. He was prescribed an antibiotic by his urologist and is currently on a prophylactic medication to prevent further bladder infections. He plans to submit a urine sample for testing to ensure the infection has been fully treated.  The patient also mentions a preference for having his medications refilled based on a 28-day schedule, as this aligns with his method of organizing his medications into weekly pill boxes. He believes this adjustment would simplify his medication management and make it easier to determine when to request refills.       Medications: Outpatient Medications Prior to Visit  Medication Sig Note   albuterol (VENTOLIN HFA) 108 (90 Base) MCG/ACT inhaler Inhale 2 puffs into the lungs every 6 (six) hours as needed for wheezing or shortness of breath.    alprazolam (XANAX) 2 MG tablet TAKE ONE TABLET EVERY SIX HOURS IF NEEDED FOR ANXIETY    amphetamine-dextroamphetamine (ADDERALL) 15 MG tablet Take 1 tablet by mouth 2 (two) times daily.    bacitracin ointment Apply topically 2 (two) times daily.    baclofen (LIORESAL) 20 MG tablet Take 1 tablet (20 mg total) by mouth 4 (four) times daily as needed for muscle spasms.    dantrolene (DANTRIUM) 100 MG capsule Take 1 capsule (100 mg total) by mouth 3 (three) times daily.    docusate sodium (COLACE) 250 MG capsule Take 1 capsule (250 mg total) by mouth 4 (four) times daily as needed.    fesoterodine (TOVIAZ) 8 MG TB24 tablet TAKE 1 TABLET BY MOUTH DAILY    flavoxATE (URISPAS) 100 MG tablet Take 1 tablet (100 mg total) by mouth 3 (three)  times daily as needed for bladder spasms.    leptospermum manuka honey (MEDIHONEY) PSTE paste Apply 1 Application topically daily.    levothyroxine (SYNTHROID) 88 MCG tablet TAKE ONE TABLET BY MOUTH EVERY DAY    linaclotide (LINZESS) 145 MCG CAPS capsule Take 145 mcg by mouth daily before breakfast.    lubiprostone (AMITIZA) 24 MCG capsule Take 1  capsule (24 mcg total) by mouth 2 (two) times daily with a meal. For opioid and Spinal cord injury based constipation-    meclizine (ANTIVERT) 25 MG tablet Take by mouth.    Mineral Oil OIL daily.    mupirocin ointment (BACTROBAN) 2 % Apply 1 Application topically 2 (two) times daily.    naloxegol oxalate (MOVANTIK) 12.5 MG TABS tablet Take 12.5 mg by mouth daily.    omeprazole (PRILOSEC) 20 MG capsule TAKE 1 CAPSULE BY MOUTH ONCE DAILY    oxybutynin (DITROPAN) 5 MG tablet Take 5 mg by mouth daily as needed.    pregabalin (LYRICA) 150 MG capsule Take one tablet two or three times a day as needed    promethazine (PHENERGAN) 25 MG suppository Place 1 suppository (25 mg total) rectally every 6 (six) hours as needed for nausea or vomiting.    promethazine (PHENERGAN) 25 MG tablet TAKE 1 TABLET BY MOUTH EVERY 4 HOURS AS NEEDED FOR NAUSEA AND VOMITING    pyridostigmine (MESTINON) 60 MG tablet Take 60 mg by mouth once.  Take 1 tablet (60 mg total) by mouth Three (3) times a day. Start with 1/2 tablet in the morning and if tolerated well, can increase up to 3 times a day    sildenafil (REVATIO) 20 MG tablet TAKE 3 TO 5 TABLETS 2 HOURS BEFORE INTERCOURSE ON EMPTY STOMACH. DO NOT TAKE WITH NITRATES.    silver sulfADIAZINE (SSD) 1 % cream APPLY 2 GRAMS TO AFFECTED AREA TWICE DAILY APPLY TO AFFECTED AREAS TOPICALLY TWICE DAILY    sodium fluoride (DENTAGEL) 1.1 % GEL dental gel as needed.    tacrolimus (PROTOPIC) 0.1 % ointment After dressing change apply topically daily to rash at buttock and cover with bandage    tiZANidine (ZANAFLEX) 4 MG tablet TAKE 2 TABLETS BY MOUTH 4 TIMES DAILY    triamcinolone ointment (KENALOG) 0.5 % Apply twice a day to affected area on left hand    [DISCONTINUED] methadone (DOLOPHINE) 10 MG tablet Take 2 tablets (20 mg total) by mouth every 6 (six) hours.    [DISCONTINUED] naproxen sodium (ANAPROX) 550 MG tablet TAKE 1 TABLET BY MOUTH 2 TIMES DAILY WITH A MEAL.    [DISCONTINUED]  oxyCODONE (ROXICODONE) 15 MG immediate release tablet TAKE ONE TABLET BY MOUTH EVERY 4-5 HOURSAS NEEDED FOR PAIN. NO MORE THAN 5 IN A DAY. 08/27/2023: changed to hydropmorphone   No facility-administered medications prior to visit.    Review of Systems      Objective    There were no vitals taken for this visit.    Physical Exam  Awake, alert, oriented x 3. In no apparent distress        Assessment & Plan        Urinary Tract Infection Recently treated with an unspecified antibiotic. Currently on prophylactic Nitrofurantoin (Macrobid) to prevent recurrent UTIs. -Submit urine sample on 08/26/2023 to confirm resolution of infection.  Chronic Pain Management Currently on Oxycodone 15mg  every 4-5 hours for pain control. Experiencing constipation, a common side effect of opioids, despite being on Movantik and Amitiza. Patient recalls less bowel issues when previously on  Hydromorphone. -Change Oxycodone to Hydromorphone for pain management. Anticipate prior authorization requirement. -Continue Movantik and Amitiza for bowel management.  Medication Refills Patient requests refills to be written for 28 days to align with his medication organization system. -Refill all medications for 28 days.    No follow-ups on file.     I discussed the assessment and treatment plan with the patient. The patient was provided an opportunity to ask questions and all were answered. The patient agreed with the plan and demonstrated an understanding of the instructions.   The patient was advised to call back or seek an in-person evaluation if the symptoms worsen or if the condition fails to improve as anticipated.  I provided 12 minutes of non-face-to-face time during this encounter.    Mila Merry, MD [facility] [phone] (phone) [phone] (fax)  [facility]

## 2023-09-15 ENCOUNTER — Encounter: Payer: Self-pay | Admitting: Family Medicine

## 2023-09-20 ENCOUNTER — Other Ambulatory Visit: Payer: Self-pay | Admitting: Family Medicine

## 2023-09-20 DIAGNOSIS — F1190 Opioid use, unspecified, uncomplicated: Secondary | ICD-10-CM

## 2023-09-20 DIAGNOSIS — G894 Chronic pain syndrome: Secondary | ICD-10-CM

## 2023-09-20 DIAGNOSIS — G8220 Paraplegia, unspecified: Secondary | ICD-10-CM

## 2023-09-23 ENCOUNTER — Other Ambulatory Visit: Payer: Self-pay | Admitting: Family Medicine

## 2023-09-23 ENCOUNTER — Telehealth: Payer: Self-pay | Admitting: Family Medicine

## 2023-09-23 DIAGNOSIS — F1190 Opioid use, unspecified, uncomplicated: Secondary | ICD-10-CM

## 2023-09-23 DIAGNOSIS — G894 Chronic pain syndrome: Secondary | ICD-10-CM

## 2023-09-23 DIAGNOSIS — G8220 Paraplegia, unspecified: Secondary | ICD-10-CM

## 2023-09-23 NOTE — Telephone Encounter (Signed)
 Covermymeds is requesting prior authorization Key: [id number] Trulance 3 mg tablets

## 2023-09-24 MED ORDER — METHADONE HCL 10 MG PO TABS: 20.0000 mg | ORAL_TABLET | Freq: Four times a day (QID) | ORAL | 0 refills | Status: DC

## 2023-09-24 MED ORDER — HYDROMORPHONE HCL 4 MG PO TABS: ORAL_TABLET | ORAL | 0 refills | Status: DC

## 2023-09-24 NOTE — Telephone Encounter (Signed)
 A prior Authorization has been started for Lubiprostone Capsules   [id number]

## 2023-09-30 ENCOUNTER — Telehealth (INDEPENDENT_AMBULATORY_CARE_PROVIDER_SITE_OTHER): Payer: Medicare HMO | Admitting: Family Medicine

## 2023-09-30 DIAGNOSIS — N390 Urinary tract infection, site not specified: Secondary | ICD-10-CM | POA: Diagnosis not present

## 2023-09-30 LAB — POCT URINALYSIS DIPSTICK
Bilirubin, UA: NEGATIVE
Blood, UA: NEGATIVE
Glucose, UA: NEGATIVE
Ketones, UA: NEGATIVE
Leukocytes, UA: NEGATIVE
Nitrite, UA: NEGATIVE
Protein, UA: POSITIVE — AB
Spec Grav, UA: >=1.03 — AB (ref 1.010–1.025)
Urobilinogen, UA: 0.2 U/dL
pH, UA: 6 (ref 5.0–8.0)

## 2023-09-30 MED ORDER — CIPROFLOXACIN HCL 500 MG PO TABS: 500.0000 mg | ORAL_TABLET | Freq: Two times a day (BID) | ORAL | 0 refills | Status: AC

## 2023-09-30 NOTE — Progress Notes (Signed)
 MyChart Video Visit    Virtual Visit via Video Note   This format is felt to be most appropriate for this patient at this time. Physical exam was limited by quality of the video and audio technology used for the visit.   Patient location: home Provider location: [facility]fp  I discussed the limitations of evaluation and management by telemedicine and the availability of in person appointments. The patient expressed understanding and agreed to proceed.  Patient: Justin [name]   DOB: 31-Aug-1981   43 y.o. Male  MRN: [id number] Visit Date: 09/30/2023  Today's healthcare provider: Nancyann Perry, MD    Subjective    Discussed the use of AI scribe software for clinical note transcription with the patient, who gave verbal consent to proceed.  History of Present Illness   The patient, with a history of chronic urinary tract infections and urological issues, presents with concerns of a possible bladder infection. He reports puffiness in the scrotum, a symptom he has previously associated with bladder infections. The patient has been on a low dose of Cefalexin (Keflexin) prescribed by his urologist for prophylaxis.  Over the weekend, he experienced urinary spasms and burning, prompting him to self-administer a higher dose of Cefalexin (500mg  twice a day) for two days. This has provided some relief, but the scrotal puffiness persists. In the past, this symptom has responded to a 2-3 week course of Ciprofloxacin .  The patient also reports a history of prostate infections, but does not believe this is currently an issue. He has been proactive in managing his medications, using a weekly pill organizer, and has recently been able to reduce his Xanax  dosage to a quarter of a 2mg  bar four times a day. This reduction was motivated by concerns raised by pain clinics about the combination of Xanax  with his pain medication.       Medications: Outpatient Medications Prior to Visit  Medication Sig    cephALEXin  (KEFLEX ) 250 MG capsule Take 250 mg by mouth every morning.   albuterol  (VENTOLIN  HFA) 108 (90 Base) MCG/ACT inhaler Inhale 2 puffs into the lungs every 6 (six) hours as needed for wheezing or shortness of breath.   alprazolam  (XANAX ) 2 MG tablet TAKE ONE TABLET EVERY SIX HOURS IF NEEDED FOR ANXIETY (Patient taking differently: Take 0.5 mg by mouth every 6 (six) hours as needed.)   amphetamine -dextroamphetamine  (ADDERALL) 15 MG tablet Take 1 tablet by mouth 2 (two) times daily.   bacitracin  ointment Apply topically 2 (two) times daily.   baclofen  (LIORESAL ) 20 MG tablet Take 1 tablet (20 mg total) by mouth 4 (four) times daily as needed for muscle spasms.   dantrolene  (DANTRIUM ) 100 MG capsule Take 1 capsule (100 mg total) by mouth 3 (three) times daily.   docusate sodium  (COLACE) 250 MG capsule Take 1 capsule (250 mg total) by mouth 4 (four) times daily as needed.   fesoterodine  (TOVIAZ ) 8 MG TB24 tablet TAKE 1 TABLET BY MOUTH DAILY   flavoxATE (URISPAS ) 100 MG tablet Take 1 tablet (100 mg total) by mouth 3 (three) times daily as needed for bladder spasms.   HYDROmorphone  (DILAUDID ) 4 MG tablet TAKE ONE TABLET BY MOUTH EVERY 4-5 HOURSAS NEEDED FOR PAIN. NO MORE THAN 5 IN A DAY.   leptospermum manuka honey (MEDIHONEY) PSTE paste Apply 1 Application topically daily.   levothyroxine  (SYNTHROID ) 88 MCG tablet TAKE ONE TABLET BY MOUTH EVERY DAY   linaclotide  (LINZESS ) 145 MCG CAPS capsule Take 145 mcg by mouth daily before  breakfast.   lubiprostone  (AMITIZA ) 24 MCG capsule Take 1 capsule (24 mcg total) by mouth 2 (two) times daily with a meal. For opioid and Spinal cord injury based constipation-   meclizine  (ANTIVERT ) 25 MG tablet Take by mouth.   methadone  (DOLOPHINE ) 10 MG tablet Take 2 tablets (20 mg total) by mouth every 6 (six) hours.   Mineral Oil OIL daily.   mupirocin  ointment (BACTROBAN ) 2 % Apply 1 Application topically 2 (two) times daily.   naloxegol oxalate (MOVANTIK) 12.5  MG TABS tablet Take 12.5 mg by mouth daily.   naproxen  sodium (ANAPROX ) 550 MG tablet TAKE 1 TABLET BY MOUTH 2 TIMES DAILY WITH A MEAL.   omeprazole  (PRILOSEC) 20 MG capsule TAKE 1 CAPSULE BY MOUTH ONCE DAILY   oxybutynin (DITROPAN) 5 MG tablet Take 5 mg by mouth daily as needed.   pregabalin  (LYRICA ) 150 MG capsule Take one tablet two or three times a day as needed   promethazine  (PHENERGAN ) 25 MG suppository Place 1 suppository (25 mg total) rectally every 6 (six) hours as needed for nausea or vomiting.   promethazine  (PHENERGAN ) 25 MG tablet TAKE 1 TABLET BY MOUTH EVERY 4 HOURS AS NEEDED FOR NAUSEA AND VOMITING   pyridostigmine (MESTINON) 60 MG tablet Take 60 mg by mouth once.  Take 1 tablet (60 mg total) by mouth Three (3) times a day. Start with 1/2 tablet in the morning and if tolerated well, can increase up to 3 times a day   sildenafil  (REVATIO ) 20 MG tablet TAKE 3 TO 5 TABLETS 2 HOURS BEFORE INTERCOURSE ON EMPTY STOMACH. DO NOT TAKE WITH NITRATES.   silver  sulfADIAZINE  (SSD) 1 % cream APPLY 2 GRAMS TO AFFECTED AREA TWICE DAILY APPLY TO AFFECTED AREAS TOPICALLY TWICE DAILY   sodium fluoride (DENTAGEL) 1.1 % GEL dental gel as needed.   tacrolimus  (PROTOPIC ) 0.1 % ointment After dressing change apply topically daily to rash at buttock and cover with bandage   tiZANidine  (ZANAFLEX ) 4 MG tablet TAKE 2 TABLETS BY MOUTH 4 TIMES DAILY   triamcinolone  ointment (KENALOG ) 0.5 % Apply twice a day to affected area on left hand   No facility-administered medications prior to visit.    Review of Systems  Constitutional:  Negative for appetite change, chills and fever.  Respiratory:  Negative for chest tightness, shortness of breath and wheezing.   Cardiovascular:  Negative for chest pain and palpitations.  Gastrointestinal:  Negative for abdominal pain, nausea and vomiting.     Objective    There were no vitals taken for this visit.  Physical Exam  General appearance: Well developed, well  nourished male, cooperative and in no acute distress Head: Normocephalic, without obvious abnormality, atraumatic Respiratory: Respirations even and unlabored, normal respiratory rate Extremities: All extremities are intact.  Skin: Skin color, texture, turgor normal. No rashes seen  Psych: Appropriate mood and affect. Neurologic: Mental status: Alert, oriented to person, place, and time, thought content appropriate.      Assessment & Plan        Urinary Tract Infection Reports symptoms of urinary tract infection and scrotal swelling. Has been self-treating with increased dose of Cefalexin (500mg  twice daily) for two days. -Collect urine sample for urinalysis and culture before starting Cipro . -Prescribe Cipro , to be started after urine sample collection.  Anxiety Reports successful reduction of Xanax  dosage to 0.5mg  daily due to concurrent use of another unspecified medication. -Continue current regimen and monitor for any changes in anxiety symptoms.    No follow-ups on  file.     I discussed the assessment and treatment plan with the patient. The patient was provided an opportunity to ask questions and all were answered. The patient agreed with the plan and demonstrated an understanding of the instructions.   The patient was advised to call back or seek an in-person evaluation if the symptoms worsen or if the condition fails to improve as anticipated.  I provided 12 minutes of non-face-to-face time during this encounter.   Nancyann Perry, MD [facility] [phone] (phone) [phone] (fax)  [facility]

## 2023-10-01 DIAGNOSIS — R339 Retention of urine, unspecified: Secondary | ICD-10-CM | POA: Diagnosis not present

## 2023-10-01 NOTE — Telephone Encounter (Signed)
Outcome  Additional Information Required  The patient currently has access to the requested medication and a Prior Authorization is not needed for the patient/medication.

## 2023-10-03 LAB — CULTURE, URINE COMPREHENSIVE

## 2023-10-14 DIAGNOSIS — G8222 Paraplegia, incomplete: Secondary | ICD-10-CM | POA: Diagnosis not present

## 2023-10-15 ENCOUNTER — Encounter: Payer: Medicare HMO | Attending: Physician Assistant | Admitting: Physician Assistant

## 2023-10-15 DIAGNOSIS — G8222 Paraplegia, incomplete: Secondary | ICD-10-CM | POA: Insufficient documentation

## 2023-10-15 DIAGNOSIS — M1990 Unspecified osteoarthritis, unspecified site: Secondary | ICD-10-CM | POA: Diagnosis not present

## 2023-10-15 DIAGNOSIS — J45909 Unspecified asthma, uncomplicated: Secondary | ICD-10-CM | POA: Diagnosis not present

## 2023-10-15 DIAGNOSIS — L89312 Pressure ulcer of right buttock, stage 2: Secondary | ICD-10-CM | POA: Diagnosis not present

## 2023-10-15 DIAGNOSIS — L89152 Pressure ulcer of sacral region, stage 2: Secondary | ICD-10-CM | POA: Diagnosis not present

## 2023-10-21 ENCOUNTER — Encounter: Payer: Medicare HMO | Attending: Physician Assistant | Admitting: Physician Assistant

## 2023-10-21 DIAGNOSIS — L89152 Pressure ulcer of sacral region, stage 2: Secondary | ICD-10-CM | POA: Insufficient documentation

## 2023-10-21 DIAGNOSIS — L89312 Pressure ulcer of right buttock, stage 2: Secondary | ICD-10-CM | POA: Diagnosis not present

## 2023-10-21 DIAGNOSIS — G8222 Paraplegia, incomplete: Secondary | ICD-10-CM | POA: Insufficient documentation

## 2023-10-22 ENCOUNTER — Ambulatory Visit: Payer: Medicare HMO | Admitting: Physician Assistant

## 2023-10-22 ENCOUNTER — Other Ambulatory Visit: Payer: Self-pay | Admitting: Family Medicine

## 2023-10-22 ENCOUNTER — Telehealth: Payer: Self-pay | Admitting: Family Medicine

## 2023-10-22 DIAGNOSIS — F1190 Opioid use, unspecified, uncomplicated: Secondary | ICD-10-CM

## 2023-10-22 DIAGNOSIS — G894 Chronic pain syndrome: Secondary | ICD-10-CM

## 2023-10-22 DIAGNOSIS — R110 Nausea: Secondary | ICD-10-CM

## 2023-10-22 DIAGNOSIS — G8220 Paraplegia, unspecified: Secondary | ICD-10-CM

## 2023-10-22 NOTE — Telephone Encounter (Signed)
 Isaiah with [facility] states that they are filling prescriptions for the pt and he has prescriptions for two different muscle relaxer's that has the same directions. Isaiah is wanting to see if the pt is needing both muscle relaxer's since this is not common. Please call Isaiah back to clarify.

## 2023-10-23 NOTE — Telephone Encounter (Signed)
 Yes, he has to take both of them

## 2023-10-23 NOTE — Telephone Encounter (Signed)
 Spoke with Siegfried Dress at [facility] and advised per Dr. Shann Darnel. He verbalized understanding.

## 2023-10-28 DIAGNOSIS — Z981 Arthrodesis status: Secondary | ICD-10-CM | POA: Diagnosis not present

## 2023-10-28 DIAGNOSIS — M9973 Connective tissue and disc stenosis of intervertebral foramina of lumbar region: Secondary | ICD-10-CM | POA: Diagnosis not present

## 2023-10-28 DIAGNOSIS — M5127 Other intervertebral disc displacement, lumbosacral region: Secondary | ICD-10-CM | POA: Diagnosis not present

## 2023-10-28 DIAGNOSIS — M48061 Spinal stenosis, lumbar region without neurogenic claudication: Secondary | ICD-10-CM | POA: Diagnosis not present

## 2023-10-28 DIAGNOSIS — M439 Deforming dorsopathy, unspecified: Secondary | ICD-10-CM | POA: Diagnosis not present

## 2023-10-28 DIAGNOSIS — G9589 Other specified diseases of spinal cord: Secondary | ICD-10-CM | POA: Diagnosis not present

## 2023-10-28 DIAGNOSIS — G8222 Paraplegia, incomplete: Secondary | ICD-10-CM | POA: Diagnosis not present

## 2023-10-28 DIAGNOSIS — M4804 Spinal stenosis, thoracic region: Secondary | ICD-10-CM | POA: Diagnosis not present

## 2023-10-29 ENCOUNTER — Encounter: Payer: Medicare HMO | Admitting: Physician Assistant

## 2023-10-29 DIAGNOSIS — L89152 Pressure ulcer of sacral region, stage 2: Secondary | ICD-10-CM | POA: Diagnosis not present

## 2023-10-29 DIAGNOSIS — L89312 Pressure ulcer of right buttock, stage 2: Secondary | ICD-10-CM | POA: Diagnosis not present

## 2023-10-29 DIAGNOSIS — G8222 Paraplegia, incomplete: Secondary | ICD-10-CM | POA: Diagnosis not present

## 2023-10-30 ENCOUNTER — Encounter: Payer: Self-pay | Admitting: Family Medicine

## 2023-11-01 DIAGNOSIS — G904 Autonomic dysreflexia: Secondary | ICD-10-CM | POA: Diagnosis not present

## 2023-11-01 NOTE — Telephone Encounter (Signed)
That's fine, she bring a sample

## 2023-11-01 NOTE — Telephone Encounter (Signed)
Patient advised.

## 2023-11-04 ENCOUNTER — Other Ambulatory Visit (INDEPENDENT_AMBULATORY_CARE_PROVIDER_SITE_OTHER): Payer: Self-pay

## 2023-11-04 ENCOUNTER — Ambulatory Visit: Payer: Medicare HMO | Admitting: Physician Assistant

## 2023-11-04 ENCOUNTER — Encounter: Payer: Medicare HMO | Admitting: Physician Assistant

## 2023-11-04 DIAGNOSIS — N3000 Acute cystitis without hematuria: Secondary | ICD-10-CM

## 2023-11-04 DIAGNOSIS — L89312 Pressure ulcer of right buttock, stage 2: Secondary | ICD-10-CM | POA: Diagnosis not present

## 2023-11-04 DIAGNOSIS — L89152 Pressure ulcer of sacral region, stage 2: Secondary | ICD-10-CM | POA: Diagnosis not present

## 2023-11-04 DIAGNOSIS — G8222 Paraplegia, incomplete: Secondary | ICD-10-CM | POA: Diagnosis not present

## 2023-11-04 LAB — POCT URINALYSIS DIPSTICK
Bilirubin, UA: NEGATIVE
Blood, UA: NEGATIVE
Glucose, UA: NEGATIVE
Ketones, UA: NEGATIVE
Leukocytes, UA: NEGATIVE
Nitrite, UA: NEGATIVE
Protein, UA: NEGATIVE
Spec Grav, UA: 1.015 (ref 1.010–1.025)
Urobilinogen, UA: 0.2 U/dL
pH, UA: 5 (ref 5.0–8.0)

## 2023-11-05 DIAGNOSIS — N3289 Other specified disorders of bladder: Secondary | ICD-10-CM | POA: Diagnosis not present

## 2023-11-05 DIAGNOSIS — N319 Neuromuscular dysfunction of bladder, unspecified: Secondary | ICD-10-CM | POA: Diagnosis not present

## 2023-11-05 DIAGNOSIS — N390 Urinary tract infection, site not specified: Secondary | ICD-10-CM | POA: Diagnosis not present

## 2023-11-06 LAB — URINE CULTURE: Organism ID, Bacteria: NO GROWTH

## 2023-11-11 ENCOUNTER — Encounter: Payer: Medicare HMO | Admitting: Physician Assistant

## 2023-11-11 DIAGNOSIS — L89312 Pressure ulcer of right buttock, stage 2: Secondary | ICD-10-CM | POA: Diagnosis not present

## 2023-11-11 DIAGNOSIS — G8222 Paraplegia, incomplete: Secondary | ICD-10-CM | POA: Diagnosis not present

## 2023-11-11 DIAGNOSIS — L89152 Pressure ulcer of sacral region, stage 2: Secondary | ICD-10-CM | POA: Diagnosis not present

## 2023-11-18 ENCOUNTER — Encounter: Payer: Self-pay | Admitting: Physical Medicine and Rehabilitation

## 2023-11-18 ENCOUNTER — Encounter: Payer: Medicare HMO | Attending: Physical Medicine and Rehabilitation | Admitting: Physical Medicine and Rehabilitation

## 2023-11-18 VITALS — BP 122/81 | HR 89 | Ht 76.0 in | Wt 181.6 lb

## 2023-11-18 DIAGNOSIS — R252 Cramp and spasm: Secondary | ICD-10-CM | POA: Diagnosis not present

## 2023-11-18 DIAGNOSIS — Z993 Dependence on wheelchair: Secondary | ICD-10-CM | POA: Diagnosis not present

## 2023-11-18 DIAGNOSIS — T402X5A Adverse effect of other opioids, initial encounter: Secondary | ICD-10-CM | POA: Diagnosis not present

## 2023-11-18 DIAGNOSIS — K5903 Drug induced constipation: Secondary | ICD-10-CM | POA: Insufficient documentation

## 2023-11-18 DIAGNOSIS — G8220 Paraplegia, unspecified: Secondary | ICD-10-CM | POA: Diagnosis not present

## 2023-11-18 DIAGNOSIS — F1190 Opioid use, unspecified, uncomplicated: Secondary | ICD-10-CM | POA: Insufficient documentation

## 2023-11-18 DIAGNOSIS — K592 Neurogenic bowel, not elsewhere classified: Secondary | ICD-10-CM | POA: Diagnosis not present

## 2023-11-18 MED ORDER — DANTROLENE SODIUM 100 MG PO CAPS: 100.0000 mg | ORAL_CAPSULE | Freq: Three times a day (TID) | ORAL | 0 refills | Status: DC

## 2023-11-18 NOTE — Patient Instructions (Signed)
 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  Here for f/u  of SCI.   Wants to switch over to [facility] PM[facility].   2.  If wants to get MRI- needs to go to [facility] and sign a release of information to get disc.  Report is on mychart.   3. Get CMP since hasn't gotten checked in last 3 months.Since on Dantrolene-    4 Has contractures of ankles- - if has [facility] send for surgery- you will need to wear the PRAFOs/ankle Dorsiflexion boots to prevent it from occurring again.    5.  Losing ROM of L knee due to contracture as well as less so in R knee-     6.  Con't Dantrolene- will give refills-  for 3 months-    7.   No f/u needed-

## 2023-11-18 NOTE — Progress Notes (Signed)
 Subjective:    Patient ID: Justin [name], male    DOB: 01/14/1981, 43 y.o.   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  Here for f/u  of SCI.   Hasn't scheduled CT myelogram.  That [facility] wanted to him to get-  because not sure if it's pushing on cord?  Able to get up a lot easier.  With spasticity meds changes.    Can schedule Urology and PM&R  on the same day.   Saw Urology- low dose ABX helping-   Spasms a lot better- 60% better- only at night, have problems anymore.   Has pressure ulcer on buttocks again- tiny, but then comes right back- heals quickly, but comes right back-  On R ischium.    Hasn't gone to mountains- due to cash flow issues.   Cannot fully stretch because will send into spasms.   And losing ROM of ankles and L knee and R hip-  "square peg in round hole"-  Big part of why having pain and spasms and AD.    Bowels- up in air- sometimes do good and sometimes has flood "out'- trying to get dosages right and timing right.   On Mestinon  backed off to 1/2 pill, Amitiza 24 mg daily- 2 tabs/day and Movantik- full dose-  might not need mestinon long term since having blow outs.   When eating food needs water/drink when eats- to get food to go down esophagus.    Still on Baclofen  30mg  QID- but hasn't increased yet- still on 20 mg QID-     Pain Inventory Average Pain 4 Pain Right Now 3 My pain is constant and dull  In the last 24 hours, has pain interfered with the following? General activity 6 Relation with others 6 Enjoyment of life 6 What TIME of day is your pain at its worst? night Sleep (in general) Poor  Pain is worse with: some activites Pain improves with: rest and medication Relief from Meds: 6  Family History  Problem Relation Age  of Onset   Other Mother    Hypertension Father    Gout Father    Prostate cancer Neg Hx    Kidney cancer Neg Hx    Social History   Socioeconomic History   Marital status: Married    Spouse name: Not on file   Number of children: Not on file   Years of education: Not on file   Highest education level: Not on file  Occupational History   Occupation: unemployed  Tobacco Use   Smoking status: Former    Passive exposure: Past   Smokeless tobacco: Never   Tobacco comments:    Using Nicorette Gum  Vaping Use   Vaping status: Never Used  Substance and Sexual Activity   Alcohol use: Yes    Alcohol/week: 0.0 standard drinks of alcohol    Comment: occasionaly-rare   Drug use: No   Sexual activity: Not on file  Other Topics Concern   Not on file  Social History Narrative   Not on file   Social Drivers of Health   Financial Resource Strain: Medium Risk (01/15/2023)   Overall Financial Resource Strain (CARDIA)    Difficulty of Paying Living Expenses: Somewhat hard  Food Insecurity: No Food Insecurity (01/15/2023)   Hunger Vital Sign    Worried About Running Out of Food in the Last Year: Never true    Ran Out of Food in the Last Year: Never true  Transportation Needs: No Transportation Needs (01/15/2023)   PRAPARE - Administrator, Civil Service (Medical): No    Lack of Transportation (Non-Medical): No  Physical Activity: Not on file  Stress: Stress Concern Present (01/15/2023)   [employer] of Occupational Health - Occupational Stress Questionnaire    Feeling of Stress : To some extent  Social Connections: Unknown (02/26/2023)   Received from [facility]   Social Network    Social Network: Not on file  Recent Concern: Social Connections - Socially Isolated (01/15/2023)   Social Connection and Isolation Panel [NHANES]    Frequency of Communication with Friends and Family: Twice a week    Frequency of Social Gatherings with Friends and Family:  Never    Attends Religious Services: Never    Diplomatic Services operational officer: No    Attends Engineer, structural: Never    Marital Status: Married   Past Surgical History:  Procedure Laterality Date   BACK SURGERY  2000   Fracture T9 and L3 with paraplegia   Past Surgical History:  Procedure Laterality Date   BACK SURGERY  2000   Fracture T9 and L3 with paraplegia   Past Medical History:  Diagnosis Date   Arthritis    Asthma    Chronic anxiety    Chronic depression    Chronic pain syndrome    secondary to fracture T9 and L3 with extensive spinal surgeery   Postoperative wound infection of right hip 2012   Rheumatic fever    There were no vitals taken for this visit.  Opioid Risk Score:   Fall Risk Score:  `1  Depression screen [facility] 2/9     05/13/2023    1:34 PM 02/13/2023   10:34 AM 01/15/2023    1:12 PM 01/10/2022    3:36 PM 12/05/2021    4:26 PM 05/10/2017    3:58 PM  Depression screen PHQ 2/9  Decreased Interest 0 0 1 1 3 3   Down, Depressed, Hopeless  2 2  2 2   PHQ - 2 Score 0 2 3 1 5 5   Altered sleeping  3 1 1 3 3   Tired, decreased energy  0 1 1 3 3   Change in appetite  3 1 1 3 3   Feeling bad or failure about yourself   3 1 1 3 3   Trouble concentrating  3 1 1 2 2   Moving slowly or fidgety/restless  0 1 0 0 0  Suicidal thoughts  0 0  0 0  PHQ-9 Score  14 9 6 19 19   Difficult doing work/chores   Somewhat difficult Somewhat difficult -- Very difficult     Review of Systems  All other systems reviewed and are negative.      Objective:   Physical Exam  Awake, alert, appropriate, appears more comfortable; accompanied by wife, in manual w/c- NAD Has ankle contractures B/L- feet are in plantar flexion-  Stuck at 45 degrees B/L  L knee  has 30-40 degrees flexion- full Extension but limited flexion R knee has  60 degrees flexion, full extension.         Assessment & Plan:  Pt is a 43 yr old male with hx of an incomplete thoracic T9  also  had L3 injury as well-  Paraplegia since March 01 1999-at age 62-  Also has TBI- with cracked skull-   With neurogenic bowel and bladder and spasticity.  Here for f/u  of SCI.   Wants to switch over to [facility] PM[facility]R.   2.  If wants to get MRI- needs to go to [facility] and sign a release of information to get disc.  Report is on mychart.   3. Get CMP since hasn't gotten checked in last 3 months.Since on Dantrolene-    4 Has contractures of ankles- - if has [facility] send for surgery- you will need to wear the PRAFOs/ankle Dorsiflexion boots to prevent it from occurring again.    5.  Losing ROM of L knee due to contracture as well as less so in R knee-     6.  Con't Dantrolene- will give refills-  for 3 months-    7.   No f/u needed-     I spent a total of  32  minutes on [facility] today- >50% coordination of care- due to d/w pt about spasticity, contractures and wound as well as getting imaging.   Also discussed wound on R ischium

## 2023-11-19 ENCOUNTER — Other Ambulatory Visit: Payer: Self-pay | Admitting: Family Medicine

## 2023-11-19 DIAGNOSIS — F1190 Opioid use, unspecified, uncomplicated: Secondary | ICD-10-CM

## 2023-11-19 DIAGNOSIS — G894 Chronic pain syndrome: Secondary | ICD-10-CM

## 2023-11-19 DIAGNOSIS — G8220 Paraplegia, unspecified: Secondary | ICD-10-CM

## 2023-11-19 LAB — COMPREHENSIVE METABOLIC PANEL
ALT: 32 IU/L (ref 0–44)
AST: 27 IU/L (ref 0–40)
Albumin: 4.5 g/dL (ref 4.1–5.1)
Alkaline Phosphatase: 76 IU/L (ref 44–121)
BUN/Creatinine Ratio: 13 (ref 9–20)
BUN: 11 mg/dL (ref 6–24)
Bilirubin Total: 0.3 mg/dL (ref 0.0–1.2)
CO2: 25 mmol/L (ref 20–29)
Calcium: 9.5 mg/dL (ref 8.7–10.2)
Chloride: 100 mmol/L (ref 96–106)
Creatinine, Ser: 0.82 mg/dL (ref 0.76–1.27)
Globulin, Total: 2.1 g/dL (ref 1.5–4.5)
Glucose: 84 mg/dL (ref 70–99)
Potassium: 4.4 mmol/L (ref 3.5–5.2)
Sodium: 140 mmol/L (ref 134–144)
Total Protein: 6.6 g/dL (ref 6.0–8.5)
eGFR: 112 mL/min/{1.73_m2} (ref >59)

## 2023-11-25 ENCOUNTER — Encounter: Payer: Medicare HMO | Attending: Physician Assistant | Admitting: Physician Assistant

## 2023-11-25 DIAGNOSIS — G8220 Paraplegia, unspecified: Secondary | ICD-10-CM | POA: Insufficient documentation

## 2023-11-25 DIAGNOSIS — L89152 Pressure ulcer of sacral region, stage 2: Secondary | ICD-10-CM | POA: Diagnosis not present

## 2023-11-25 DIAGNOSIS — L89313 Pressure ulcer of right buttock, stage 3: Secondary | ICD-10-CM | POA: Diagnosis not present

## 2023-11-26 ENCOUNTER — Encounter: Payer: Self-pay | Admitting: Family Medicine

## 2023-12-09 ENCOUNTER — Ambulatory Visit: Admitting: Physician Assistant

## 2023-12-16 ENCOUNTER — Telehealth: Payer: Self-pay

## 2023-12-16 ENCOUNTER — Ambulatory Visit: Admitting: Internal Medicine

## 2023-12-16 ENCOUNTER — Telehealth: Admitting: Family Medicine

## 2023-12-16 ENCOUNTER — Other Ambulatory Visit: Payer: Self-pay | Admitting: Family Medicine

## 2023-12-16 DIAGNOSIS — N390 Urinary tract infection, site not specified: Secondary | ICD-10-CM | POA: Diagnosis not present

## 2023-12-16 DIAGNOSIS — G8220 Paraplegia, unspecified: Secondary | ICD-10-CM

## 2023-12-16 DIAGNOSIS — G894 Chronic pain syndrome: Secondary | ICD-10-CM

## 2023-12-16 DIAGNOSIS — F1190 Opioid use, unspecified, uncomplicated: Secondary | ICD-10-CM

## 2023-12-16 NOTE — Telephone Encounter (Signed)
 Copied from CRM [id number]. Topic: Clinical - Medical Advice >> Dec 16, 2023  3:50 PM Clayton Bibles wrote: Reason for CRM:  Gardiner Barefoot is calling to let office know he forgot about his appointment today. He wants to know if he needs to see Dr. [name] to gets medication refills and to check in with Dr. [name]. Please message through MyChart.

## 2023-12-20 DIAGNOSIS — R339 Retention of urine, unspecified: Secondary | ICD-10-CM | POA: Diagnosis not present

## 2023-12-23 ENCOUNTER — Encounter: Attending: Physician Assistant | Admitting: Physician Assistant

## 2023-12-23 ENCOUNTER — Telehealth: Payer: Self-pay | Admitting: Family Medicine

## 2023-12-23 DIAGNOSIS — L89152 Pressure ulcer of sacral region, stage 2: Secondary | ICD-10-CM | POA: Diagnosis not present

## 2023-12-23 DIAGNOSIS — G8222 Paraplegia, incomplete: Secondary | ICD-10-CM | POA: Insufficient documentation

## 2023-12-23 NOTE — Telephone Encounter (Unsigned)
 Copied from CRM [id number]. Topic: Clinical - Request for Lab/Test Order >> Dec 23, 2023  4:26 PM Justin [name] wrote: Reason for CRM: Bladder infection, bladder spasms, burning, increase of spasms Since 12/22/23 Patient would like to be notified on my chart that he can bring urin to office to be looked at

## 2024-01-06 ENCOUNTER — Ambulatory Visit: Admitting: Physician Assistant

## 2024-01-09 ENCOUNTER — Other Ambulatory Visit: Payer: Self-pay | Admitting: Family Medicine

## 2024-01-09 DIAGNOSIS — F1190 Opioid use, unspecified, uncomplicated: Secondary | ICD-10-CM

## 2024-01-09 DIAGNOSIS — G8220 Paraplegia, unspecified: Secondary | ICD-10-CM

## 2024-01-09 DIAGNOSIS — G894 Chronic pain syndrome: Secondary | ICD-10-CM

## 2024-01-13 ENCOUNTER — Encounter: Admitting: Physician Assistant

## 2024-01-13 DIAGNOSIS — L89152 Pressure ulcer of sacral region, stage 2: Secondary | ICD-10-CM | POA: Diagnosis not present

## 2024-01-13 DIAGNOSIS — G8222 Paraplegia, incomplete: Secondary | ICD-10-CM | POA: Diagnosis not present

## 2024-01-20 ENCOUNTER — Telehealth: Payer: Self-pay

## 2024-01-20 ENCOUNTER — Ambulatory Visit (INDEPENDENT_AMBULATORY_CARE_PROVIDER_SITE_OTHER): Payer: Medicare HMO

## 2024-01-20 VITALS — Ht 76.0 in

## 2024-01-20 DIAGNOSIS — R052 Subacute cough: Secondary | ICD-10-CM

## 2024-01-20 DIAGNOSIS — Z0000 Encounter for general adult medical examination without abnormal findings: Secondary | ICD-10-CM

## 2024-01-20 DIAGNOSIS — Z0100 Encounter for examination of eyes and vision without abnormal findings: Secondary | ICD-10-CM

## 2024-01-20 NOTE — Progress Notes (Signed)
 Because this visit was a virtual/telehealth visit,  certain criteria was not obtained, such a blood pressure, CBG if applicable, and timed get up and go. Any medications not marked as "taking" were not mentioned during the medication reconciliation part of the visit. Any vitals not documented were not able to be obtained due to this being a telehealth visit or patient was unable to self-report a recent blood pressure reading due to a lack of equipment at home via telehealth. Vitals that have been documented are verbally provided by the patient.   Subjective:   Justin [name] is a 43 y.o. who presents for a Medicare Wellness preventive visit.  Visit Complete: Virtual I connected with  Mai Schwalbe on 01/20/24 by a audio enabled telemedicine application and verified that I am speaking with the correct person using two identifiers.  Patient Location: Home  Provider Location: Office/Clinic  I discussed the limitations of evaluation and management by telemedicine. The patient expressed understanding and agreed to proceed.  Vital Signs: Because this visit was a virtual/telehealth visit, some criteria may be missing or patient reported. Any vitals not documented were not able to be obtained and vitals that have been documented are patient reported.  VideoDeclined- This patient declined Librarian, academic. Therefore the visit was completed with audio only.  Persons Participating in Visit: Patient.  AWV Questionnaire: No: Patient Medicare AWV questionnaire was not completed prior to this visit.  Cardiac Risk Factors include: advanced age (>54men, >60 women)     Objective:    Today's Vitals   01/20/24 1543 01/20/24 1545  Height: 6\' 4"  (1.93 m)   PainSc: 4  4   PainLoc: Generalized    Body mass index is 22.11 kg/m.     01/20/2024    3:48 PM 01/15/2023    2:00 PM 10/03/2022    8:00 AM 01/10/2022    3:40 PM 10/17/2019    8:09 PM 02/19/2015     8:44 PM  Advanced Directives  Does Patient Have a Medical Advance Directive? Yes No No No Yes No  Type of Estate agent of [place];Living will    Healthcare Power of Attorney   Copy of Healthcare Power of Attorney in Chart? No - copy requested       Would patient like information on creating a medical advance directive?   No - Patient declined No - Patient declined  Yes - Educational materials given    Current Medications (verified) Outpatient Encounter Medications as of 01/20/2024  Medication Sig   mirabegron ER (MYRBETRIQ) 25 MG TB24 tablet Take 50 mg by mouth daily. TAKE TWO TABLETS PO QD   albuterol  (VENTOLIN  HFA) 108 (90 Base) MCG/ACT inhaler Inhale 2 puffs into the lungs every 6 (six) hours as needed for wheezing or shortness of breath.   alprazolam  (XANAX ) 2 MG tablet TAKE ONE TABLET EVERY SIX HOURS IF NEEDED FOR ANXIETY   amphetamine -dextroamphetamine  (ADDERALL) 15 MG tablet Take 1 tablet by mouth 2 (two) times daily.   bacitracin  ointment Apply topically 2 (two) times daily.   baclofen  (LIORESAL ) 20 MG tablet Take 1 tablet (20 mg total) by mouth 4 (four) times daily as needed for muscle spasms.   cephALEXin  (KEFLEX ) 250 MG capsule Take 250 mg by mouth every morning.   dantrolene  (DANTRIUM ) 100 MG capsule Take 1 capsule (100 mg total) by mouth 3 (three) times daily.   docusate sodium  (COLACE) 250 MG capsule Take 1 capsule (250 mg total) by mouth 4 (  four) times daily as needed.   fesoterodine  (TOVIAZ ) 8 MG TB24 tablet TAKE 1 TABLET BY MOUTH DAILY   flavoxATE (URISPAS ) 100 MG tablet Take 1 tablet (100 mg total) by mouth 3 (three) times daily as needed for bladder spasms.   HYDROmorphone  (DILAUDID ) 4 MG tablet TAKE ONE TABLET BY MOUTH EVERY 4-5 HOURSAS NEEDED FOR PAIN. NO MORE THAN 5 TABS IN A DAY.   leptospermum manuka honey (MEDIHONEY) PSTE paste Apply 1 Application topically daily.   levothyroxine  (SYNTHROID ) 88 MCG tablet TAKE 1 TABLET BY MOUTH DAILY    linaclotide  (LINZESS ) 145 MCG CAPS capsule Take 145 mcg by mouth daily before breakfast.   lubiprostone  (AMITIZA ) 24 MCG capsule Take 1 capsule (24 mcg total) by mouth 2 (two) times daily with a meal. For opioid and Spinal cord injury based constipation-   meclizine  (ANTIVERT ) 25 MG tablet Take by mouth.   methadone  (DOLOPHINE ) 10 MG tablet Take 2 tablets (20 mg total) by mouth every 6 (six) hours.   Mineral Oil OIL daily.   mupirocin  ointment (BACTROBAN ) 2 % Apply 1 Application topically 2 (two) times daily.   naloxegol oxalate (MOVANTIK) 12.5 MG TABS tablet Take 12.5 mg by mouth daily.   naproxen  sodium (ANAPROX ) 550 MG tablet TAKE 1 TABLET BY MOUTH 2 TIMES DAILY WITH A MEAL.   omeprazole  (PRILOSEC) 20 MG capsule TAKE 1 CAPSULE BY MOUTH ONCE DAILY   oxybutynin (DITROPAN) 5 MG tablet Take 5 mg by mouth daily as needed.   pregabalin  (LYRICA ) 150 MG capsule Take one tablet two or three times a day as needed   promethazine  (PHENERGAN ) 25 MG suppository PLACE 1 SUPPOSITORY RECTALLY EVERY 6 HOURS AS NEEDED FOR NAUSEA OR VOMITING   promethazine  (PHENERGAN ) 25 MG tablet TAKE 1 TABLET BY MOUTH EVERY 4 HOURS AS NEEDED FOR NAUSEA AND VOMITING   pyridostigmine (MESTINON) 60 MG tablet Take 60 mg by mouth once.  Take 1 tablet (60 mg total) by mouth Three (3) times a day. Start with 1/2 tablet in the morning and if tolerated well, can increase up to 3 times a day   sildenafil  (REVATIO ) 20 MG tablet TAKE 3 TO 5 TABLETS 2 HOURS BEFORE INTERCOURSE ON EMPTY STOMACH. DO NOT TAKE WITH NITRATES.   silver  sulfADIAZINE  (SSD) 1 % cream APPLY 2 GRAMS TO AFFECTED AREA TWICE DAILY APPLY TO AFFECTED AREAS TOPICALLY TWICE DAILY   sodium fluoride (DENTAGEL) 1.1 % GEL dental gel as needed.   tacrolimus  (PROTOPIC ) 0.1 % ointment After dressing change apply topically daily to rash at buttock and cover with bandage   tiZANidine  (ZANAFLEX ) 4 MG tablet TAKE 2 TABLETS BY MOUTH 4 TIMES DAILY   triamcinolone  ointment (KENALOG ) 0.5 %  Apply twice a day to affected area on left hand   No facility-administered encounter medications on file as of 01/20/2024.    Allergies (verified) Azithromycin, Lorazepam, Meperidine, Morphine , and Morphine  sulfate   History: Past Medical History:  Diagnosis Date   Arthritis    Asthma    Chronic anxiety    Chronic depression    Chronic pain syndrome    secondary to fracture T9 and L3 with extensive spinal surgeery   Postoperative wound infection of right hip 2012   Rheumatic fever    Past Surgical History:  Procedure Laterality Date   BACK SURGERY  2000   Fracture T9 and L3 with paraplegia   Family History  Problem Relation Age of Onset   Other Mother    Hypertension Father  Gout Father    Prostate cancer Neg Hx    Kidney cancer Neg Hx    Social History   Socioeconomic History   Marital status: Married    Spouse name: Not on file   Number of children: Not on file   Years of education: Not on file   Highest education level: Not on file  Occupational History   Occupation: unemployed  Tobacco Use   Smoking status: Former    Passive exposure: Past   Smokeless tobacco: Never   Tobacco comments:    Using Nicorette Gum  Vaping Use   Vaping status: Never Used  Substance and Sexual Activity   Alcohol use: Yes    Alcohol/week: 0.0 standard drinks of alcohol    Comment: occasionaly-rare   Drug use: No   Sexual activity: Not on file  Other Topics Concern   Not on file  Social History Narrative   Not on file   Social Drivers of Health   Financial Resource Strain: Low Risk  (01/20/2024)   Overall Financial Resource Strain (CARDIA)    Difficulty of Paying Living Expenses: Not very hard  Food Insecurity: No Food Insecurity (01/20/2024)   Hunger Vital Sign    Worried About Running Out of Food in the Last Year: Never true    Ran Out of Food in the Last Year: Never true  Transportation Needs: No Transportation Needs (01/20/2024)   PRAPARE - Scientist, research (physical sciences) (Medical): No    Lack of Transportation (Non-Medical): No  Physical Activity: Patient Declined (01/20/2024)   Exercise Vital Sign    Days of Exercise per Week: Patient declined    Minutes of Exercise per Session: Patient declined  Stress: Stress Concern Present (01/20/2024)   [employer] of Occupational Health - Occupational Stress Questionnaire    Feeling of Stress : To some extent  Social Connections: Socially Isolated (01/20/2024)   Social Connection and Isolation Panel [NHANES]    Frequency of Communication with Friends and Family: Twice a week    Frequency of Social Gatherings with Friends and Family: Never    Attends Religious Services: Never    Diplomatic Services operational officer: No    Attends Engineer, structural: Never    Marital Status: Married    Tobacco Counseling Counseling given: Not Answered Tobacco comments: Using Nicorette Gum    Clinical Intake:  Pre-visit preparation completed: Yes  Pain : 0-10 Pain Score: 4  Pain Type: Chronic pain     Nutritional Risks: None Diabetes: No  Lab Results  Component Value Date   HGBA1C 5.1 12/29/2022     How often do you need to have someone help you when you read instructions, pamphlets, or other written materials from your doctor or pharmacy?: 1 - Never What is the last grade level you completed in school?: SOME COLLEGE; HORSE TRAINER  Interpreter Needed?: No  Information entered by :: Druscilla Gerhard, LPN.   Activities of Daily Living     01/20/2024    3:52 PM  In your present state of health, do you have any difficulty performing the following activities:  Hearing? 0  Vision? 0  Difficulty concentrating or making decisions? 1  Comment concentating due to ADHD  Walking or climbing stairs? 0  Dressing or bathing? 0  Doing errands, shopping? 0  Preparing Food and eating ? N  Using the Toilet? N  In the past six months, have you accidently leaked urine? N  Do you  have  problems with loss of bowel control? N  Managing your Medications? N  Managing your Finances? N  Housekeeping or managing your Housekeeping? N    Patient Care Team: Lamon Pillow, MD as PCP - General (Family Medicine) Shann Darnel Erlinda Haws, MD as PCP - Family Medicine (Family Medicine) Eartha Gold, MD (Infectious Diseases) Teresa Fender Rosamond Comes, MD (Urology) Gasper Karst, MD as Referring Physician (Urology)  Indicate any recent Medical Services you may have received from other than [facility] providers in the past year (date may be approximate).     Assessment:   This is a routine wellness examination for Justin [name].  Hearing/Vision screen Hearing Screening - Comments:: Denies hearing difficulties.  Vision Screening - Comments:: No eyeglasses - not up to date with routine eye exams.    Goals Addressed             This Visit's Progress    Client understands the importance of follow-up with providers by attending scheduled visits.         Depression Screen     01/20/2024    3:50 PM 11/18/2023    1:02 PM 05/13/2023    1:34 PM 02/13/2023   10:34 AM 01/15/2023    1:12 PM 01/10/2022    3:36 PM 12/05/2021    4:26 PM  PHQ 2/9 Scores  PHQ - 2 Score 0 0 0 2 3 1 5   PHQ- 9 Score 2   14 9 6 19     Fall Risk     01/20/2024    3:49 PM 11/18/2023    1:02 PM 08/19/2023    1:49 PM 05/13/2023    1:34 PM 02/13/2023   10:35 AM  Fall Risk   Falls in the past year? 0 0 0 0 0  Number falls in past yr: 0   0 0  Injury with Fall? 0   0 0  Risk for fall due to : No Fall Risks      Follow up Falls prevention discussed;Falls evaluation completed        MEDICARE RISK AT HOME:  Medicare Risk at Home Any stairs in or around the home?: Yes (Patient has a ramp) If so, are there any without handrails?: No Home free of loose throw rugs in walkways, pet beds, electrical cords, etc?: Yes Adequate lighting in your home to reduce risk of falls?: Yes Life alert?: No Use of a cane, walker or w/c?: Yes Grab  bars in the bathroom?: No Shower chair or bench in shower?: No Elevated toilet seat or a handicapped toilet?: No  TIMED UP AND GO:  Was the test performed?  No  Cognitive Function: 6CIT completed    01/20/2024    3:51 PM  MMSE - Mini Mental State Exam  Not completed: Unable to complete        01/20/2024    3:51 PM 01/15/2023    1:17 PM 01/10/2022    3:43 PM  6CIT Screen  What Year? 0 points 0 points 0 points  What month? 0 points 0 points 0 points  What time? 0 points 0 points 0 points  Count back from 20 0 points 0 points 0 points  Months in reverse 0 points 2 points 2 points  Repeat phrase 0 points 10 points 0 points  Total Score 0 points 12 points 2 points    Immunizations Immunization History  Administered Date(s) Administered   Influenza,inj,Quad PF,6+ Mos 06/25/2014, 07/21/2015, 11/12/2016, 10/22/2017, 06/17/2018, 06/22/2019   Influenza-Unspecified 06/27/2016  Pneumococcal Polysaccharide-23 06/25/2014   Tdap 02/16/2022    Screening Tests Health Maintenance  Topic Date Due   COVID-19 Vaccine (1) Never done   Hepatitis C Screening  Never done   Pneumococcal Vaccine 48-96 Years old (2 of 2 - PCV) 06/26/2015   INFLUENZA VACCINE  04/17/2024   Medicare Annual Wellness (AWV)  01/19/2025   DTaP/Tdap/Td (2 - Td or Tdap) 02/17/2032   HIV Screening  Completed   HPV VACCINES  Aged Out   Meningococcal B Vaccine  Aged Out    Health Maintenance  Health Maintenance Due  Topic Date Due   COVID-19 Vaccine (1) Never done   Hepatitis C Screening  Never done   Pneumococcal Vaccine 30-48 Years old (2 of 2 - PCV) 06/26/2015   Health Maintenance Items Addressed: Referral sent to Optometry/Ophthalmology  Additional Screening:  Vision Screening: Recommended annual ophthalmology exams for early detection of glaucoma and other disorders of the eye.  Dental Screening: Recommended annual dental exams for proper oral hygiene  Community Resource Referral / Chronic Care  Management: CRR required this visit?  No   CCM required this visit?  No     Plan:     I have personally reviewed and noted the following in the patient's chart:   Medical and social history Use of alcohol, tobacco or illicit drugs  Current medications and supplements including opioid prescriptions. Patient is currently taking opioid prescriptions. Information provided to patient regarding non-opioid alternatives. Patient advised to discuss non-opioid treatment plan with their provider. Functional ability and status Nutritional status Physical activity Advanced directives List of other physicians Hospitalizations, surgeries, and ER visits in previous 12 months Vitals Screenings to include cognitive, depression, and falls Referrals and appointments  In addition, I have reviewed and discussed with patient certain preventive protocols, quality metrics, and best practice recommendations. A written personalized care plan for preventive services as well as general preventive health recommendations were provided to patient.     Margette Sheldon, LPN   [birth year]1913   After Visit Summary: (MyChart) Due to this being a telephonic visit, the after visit summary with patients personalized plan was offered to patient via MyChart   Nurse Notes: Patient is stating that he has blurred vision while driving.  A referral for optometrist was ordered for this patient. Patient declined vaccines at this time.

## 2024-01-20 NOTE — Patient Instructions (Addendum)
 [name] , Thank you for taking time to come for your Medicare Wellness Visit. I appreciate your ongoing commitment to your health goals. Please review the following plan we discussed and let me know if I can assist you in the future.   Referrals/Orders/Follow-Ups/Clinician Recommendations: A referral was sent to [facility] for routine eye exam.  Their office is located in [place], [place] and will call you to schedule.  This is a list of the screening recommended for you and due dates:  Health Maintenance  Topic Date Due   COVID-19 Vaccine (1) Never done   Hepatitis C Screening  Never done   Pneumococcal Vaccination (2 of 2 - PCV) 06/26/2015   Flu Shot  04/17/2024   Medicare Annual Wellness Visit  01/19/2025   DTaP/Tdap/Td vaccine (2 - Td or Tdap) 02/17/2032   HIV Screening  Completed   HPV Vaccine  Aged Out   Meningitis B Vaccine  Aged Out    Advanced directives: (Declined) Advance directive discussed with you today. Even though you declined this today, please call our office should you change your mind, and we can give you the proper paperwork for you to fill out.  Next Medicare Annual Wellness Visit scheduled for next year: Yes, 01/27/2025 at 3:50 p.m. with Nurse Health Advisor  Have you seen your provider in the last 6 months (3 months if uncontrolled diabetes)? Yes

## 2024-01-20 NOTE — Telephone Encounter (Signed)
 Patient c/o chest congestion with clear drainage which has been going on for over 1 month now.  Patient stated that it sounds like the croup and that he is having a little bit of rattling in his chest but no SOB. Patient wants to know if something could be called into the pharmacy or does he need an office or telehealth visit.  Lucely Leard N. Glenna Lango, LPN [employer]

## 2024-01-22 ENCOUNTER — Encounter: Payer: Self-pay | Admitting: Family Medicine

## 2024-01-22 MED ORDER — MONTELUKAST SODIUM 10 MG PO TABS: 10.0000 mg | ORAL_TABLET | Freq: Every day | ORAL | 0 refills | Status: AC

## 2024-01-22 MED ORDER — DOXYCYCLINE HYCLATE 100 MG PO TABS
100.0000 mg | ORAL_TABLET | Freq: Two times a day (BID) | ORAL | 0 refills | Status: AC
Start: 2024-01-22 — End: 2024-02-01

## 2024-01-22 NOTE — Telephone Encounter (Signed)
 Patient advised.

## 2024-01-22 NOTE — Telephone Encounter (Signed)
 Have sent prescription for doxycycline  and montelukast [facility]

## 2024-01-22 NOTE — Addendum Note (Signed)
 Addended by: Lamon Pillow on: 01/22/2024 08:06 AM   Modules accepted: Orders

## 2024-01-23 MED ORDER — AMOXICILLIN 500 MG PO CAPS: 1000.0000 mg | ORAL_CAPSULE | Freq: Two times a day (BID) | ORAL | 0 refills | Status: AC

## 2024-01-29 ENCOUNTER — Encounter: Payer: Self-pay | Admitting: Family Medicine

## 2024-01-29 NOTE — Telephone Encounter (Signed)
 That's fine, he can drop off a sample for u/a and culture

## 2024-01-30 ENCOUNTER — Other Ambulatory Visit (INDEPENDENT_AMBULATORY_CARE_PROVIDER_SITE_OTHER): Payer: Self-pay

## 2024-01-30 DIAGNOSIS — N390 Urinary tract infection, site not specified: Secondary | ICD-10-CM

## 2024-01-30 LAB — POCT URINALYSIS DIPSTICK
Bilirubin, UA: NEGATIVE
Blood, UA: NEGATIVE
Glucose, UA: NEGATIVE
Ketones, UA: NEGATIVE
Leukocytes, UA: NEGATIVE
Nitrite, UA: NEGATIVE
Protein, UA: NEGATIVE
Spec Grav, UA: <=1.005 — AB (ref 1.010–1.025)
Urobilinogen, UA: 0.2 U/dL
pH, UA: 8 (ref 5.0–8.0)

## 2024-02-01 LAB — URINE CULTURE: Organism ID, Bacteria: NO GROWTH

## 2024-02-03 NOTE — Telephone Encounter (Signed)
Please see the message below and advise.

## 2024-02-06 ENCOUNTER — Other Ambulatory Visit: Payer: Self-pay | Admitting: Family Medicine

## 2024-02-06 DIAGNOSIS — F1190 Opioid use, unspecified, uncomplicated: Secondary | ICD-10-CM

## 2024-02-06 DIAGNOSIS — G8220 Paraplegia, unspecified: Secondary | ICD-10-CM

## 2024-02-06 DIAGNOSIS — G894 Chronic pain syndrome: Secondary | ICD-10-CM

## 2024-02-12 ENCOUNTER — Encounter: Payer: Self-pay | Admitting: Family Medicine

## 2024-02-12 DIAGNOSIS — B356 Tinea cruris: Secondary | ICD-10-CM

## 2024-02-12 NOTE — Telephone Encounter (Signed)
 Please see the message below.

## 2024-02-13 MED ORDER — SILVER SULFADIAZINE 1 % EX CREA: TOPICAL_CREAM | CUTANEOUS | 2 refills | Status: AC

## 2024-02-14 ENCOUNTER — Telehealth: Payer: Self-pay

## 2024-02-14 NOTE — Telephone Encounter (Signed)
 Copied from CRM [id number]. Topic: Clinical - Lab/Test Results >> Feb 14, 2024  1:05 PM Felizardo Hotter wrote: Reason for CRM: Pt called to bring in urine sample for testing of UTI. Please call pt at [phone]

## 2024-02-14 NOTE — Telephone Encounter (Signed)
 That's fine

## 2024-02-16 ENCOUNTER — Encounter: Payer: Self-pay | Admitting: Family Medicine

## 2024-02-17 ENCOUNTER — Other Ambulatory Visit: Payer: Self-pay

## 2024-02-17 ENCOUNTER — Other Ambulatory Visit (INDEPENDENT_AMBULATORY_CARE_PROVIDER_SITE_OTHER)

## 2024-02-17 ENCOUNTER — Ambulatory Visit: Payer: Self-pay | Admitting: Family Medicine

## 2024-02-17 DIAGNOSIS — N390 Urinary tract infection, site not specified: Secondary | ICD-10-CM

## 2024-02-17 LAB — POCT URINALYSIS DIPSTICK
Bilirubin, UA: NEGATIVE
Glucose, UA: NEGATIVE
Ketones, UA: NEGATIVE
Nitrite, UA: POSITIVE
Protein, UA: NEGATIVE
Spec Grav, UA: 1.02 (ref 1.010–1.025)
Urobilinogen, UA: 0.2 U/dL
pH, UA: 6.5 (ref 5.0–8.0)

## 2024-02-17 MED ORDER — NITROFURANTOIN MONOHYD MACRO 100 MG PO CAPS: 100.0000 mg | ORAL_CAPSULE | Freq: Two times a day (BID) | ORAL | 0 refills | Status: AC

## 2024-02-20 LAB — SPECIMEN STATUS REPORT

## 2024-02-20 LAB — URINE CULTURE

## 2024-02-20 MED ORDER — CIPROFLOXACIN HCL 500 MG PO TABS
500.0000 mg | ORAL_TABLET | Freq: Two times a day (BID) | ORAL | 0 refills | Status: DC
Start: 2024-02-20 — End: 2024-02-24

## 2024-02-21 ENCOUNTER — Ambulatory Visit: Payer: Self-pay

## 2024-02-21 NOTE — Telephone Encounter (Signed)
 FYI Only or Action Required?: FYI only for provider  Patient was last seen in primary care on 12/16/2023 by Lamon Pillow, MD. Called Nurse Triage reporting lab question. Symptoms began today. Interventions attempted: Nothing. Symptoms are: gradually worsening.  Triage Disposition: See PCP When Office is Open (Within 3 Days), Call PCP When Office is Open  Patient/caregiver understands and will follow disposition?: Unsure  Copied from CRM [id number]. Topic: Clinical - Lab/Test Results >> Feb 21, 2024  5:28 PM Everette C wrote: Reason for CRM: The patient would like to speak about their bladder infection and recent lab results from testing on 02/17/24 as well as their relation to their recently prescribed ciprofloxacin  (CIPRO ) 500 MG tablet [[id number]]   Please contact further when possible Reason for Disposition  Caller requesting routine or non-urgent lab result  [1] Muscle jerks or tics without an obvious cause AND [2] brief (seconds, gone now) AND [3] otherwise feels normal (well)  Answer Assessment - Initial Assessment Questions 1. REASON FOR CALL or QUESTION: "What is your reason for calling today?" or "How can I best help you?" or "What question do you have that I can help answer?"     Wondering if antibiotic is correct for infection 2. CALLER: Document the source of call. (e.g., laboratory, patient).     Patient  Answer Assessment - Initial Assessment Questions 1. APPEARANCE of MOVEMENT: "What did the jerking or twitching look like?" (e.g., body area)     Leg spasms 2. ONSET: "When did this start happening?" (e.g., hours, days, weeks, months ago)     One hour ago 3. DURATION: "How long does the jerk, twitch, or spasm last?"     seconds 4. FREQUENCY:  "How often does this happen?"      unsure 5. WHEN: "When does this happen?" (e.g., while awake, while falling asleep, while sleeping)     Onset one hour ago 6. CAUSE: "What do you think caused the spasms?"     infection 7. OTHER  SYMPTOMS: "Are there any other symptoms?" (e.g., fever, headache)     Being treated for uti Additional info: States he has leg spasms whenever he has an infection. Worried antibiotic is not working. Will proceed to urgent care  Protocols used: PCP Call - No Triage-A-AH, Muscle Jerks - Tics - [facility]

## 2024-02-24 MED ORDER — LEVOFLOXACIN 750 MG PO TABS: 750.0000 mg | ORAL_TABLET | Freq: Every day | ORAL | 0 refills | Status: AC

## 2024-02-24 NOTE — Addendum Note (Signed)
 Addended by: Lamon Pillow on: 02/24/2024 10:48 AM   Modules accepted: Orders

## 2024-03-02 ENCOUNTER — Encounter: Payer: Self-pay | Admitting: Family Medicine

## 2024-03-03 DIAGNOSIS — N319 Neuromuscular dysfunction of bladder, unspecified: Secondary | ICD-10-CM | POA: Diagnosis not present

## 2024-03-03 DIAGNOSIS — N390 Urinary tract infection, site not specified: Secondary | ICD-10-CM | POA: Diagnosis not present

## 2024-03-03 DIAGNOSIS — N3289 Other specified disorders of bladder: Secondary | ICD-10-CM | POA: Diagnosis not present

## 2024-03-06 ENCOUNTER — Other Ambulatory Visit: Payer: Self-pay | Admitting: Family Medicine

## 2024-03-06 ENCOUNTER — Other Ambulatory Visit: Payer: Self-pay | Admitting: Physical Medicine and Rehabilitation

## 2024-03-06 DIAGNOSIS — G8220 Paraplegia, unspecified: Secondary | ICD-10-CM

## 2024-03-06 DIAGNOSIS — F1190 Opioid use, unspecified, uncomplicated: Secondary | ICD-10-CM

## 2024-03-06 DIAGNOSIS — G894 Chronic pain syndrome: Secondary | ICD-10-CM

## 2024-03-09 ENCOUNTER — Telehealth: Payer: Self-pay

## 2024-03-09 MED ORDER — PROMETHAZINE HCL 50 MG RE SUPP: 50.0000 mg | Freq: Four times a day (QID) | RECTAL | 0 refills | Status: DC | PRN

## 2024-03-09 NOTE — Telephone Encounter (Signed)
 Copied from CRM [id number]. Topic: Clinical - Prescription Issue >> Mar 09, 2024  9:25 AM Montie POUR wrote: Reason for CRM:  [facility] cannot get promethazine  (PROMETHEGAN) 50 MG suppository. They can get promethazine  (PROMETHEGAN) 25 MG suppository. They want to know if Dr. Gasper will order the 25 MG and order it to place 2 suppository (50 MG total) rectally every 6 (six) hours as needed for nausea or vomiting. Please call Pharmacy at [phone]. Thanks

## 2024-03-10 DIAGNOSIS — R339 Retention of urine, unspecified: Secondary | ICD-10-CM | POA: Diagnosis not present

## 2024-03-12 MED ORDER — PROMETHAZINE HCL 25 MG RE SUPP: 50.0000 mg | Freq: Four times a day (QID) | RECTAL | 0 refills | Status: DC | PRN

## 2024-03-12 NOTE — Addendum Note (Signed)
 Addended by: DONZELLA DOMINO on: 03/12/2024 08:52 AM   Modules accepted: Orders

## 2024-03-13 ENCOUNTER — Encounter: Payer: Self-pay | Admitting: Family Medicine

## 2024-03-17 ENCOUNTER — Other Ambulatory Visit (HOSPITAL_COMMUNITY): Payer: Self-pay

## 2024-03-24 DIAGNOSIS — G904 Autonomic dysreflexia: Secondary | ICD-10-CM | POA: Diagnosis not present

## 2024-03-24 DIAGNOSIS — N319 Neuromuscular dysfunction of bladder, unspecified: Secondary | ICD-10-CM | POA: Diagnosis not present

## 2024-03-24 DIAGNOSIS — G839 Paralytic syndrome, unspecified: Secondary | ICD-10-CM | POA: Diagnosis not present

## 2024-04-04 ENCOUNTER — Other Ambulatory Visit: Payer: Self-pay | Admitting: Family Medicine

## 2024-04-04 DIAGNOSIS — F1190 Opioid use, unspecified, uncomplicated: Secondary | ICD-10-CM

## 2024-04-04 DIAGNOSIS — G894 Chronic pain syndrome: Secondary | ICD-10-CM

## 2024-04-04 DIAGNOSIS — G8220 Paraplegia, unspecified: Secondary | ICD-10-CM

## 2024-04-21 DIAGNOSIS — R339 Retention of urine, unspecified: Secondary | ICD-10-CM | POA: Diagnosis not present

## 2024-04-28 DIAGNOSIS — N319 Neuromuscular dysfunction of bladder, unspecified: Secondary | ICD-10-CM | POA: Diagnosis not present

## 2024-04-28 DIAGNOSIS — N3289 Other specified disorders of bladder: Secondary | ICD-10-CM | POA: Diagnosis not present

## 2024-04-29 ENCOUNTER — Encounter: Payer: Self-pay | Admitting: Family Medicine

## 2024-04-30 NOTE — Telephone Encounter (Signed)
Please see the message below and advise.

## 2024-05-04 ENCOUNTER — Ambulatory Visit
Admission: RE | Admit: 2024-05-04 | Discharge: 2024-05-04 | Disposition: A | Discharge status: Home / Self Care | Source: Ambulatory Visit | Attending: Family Medicine | Admitting: Family Medicine

## 2024-05-04 ENCOUNTER — Ambulatory Visit
Admission: RE | Admit: 2024-05-04 | Discharge: 2024-05-04 | Disposition: A | Discharge status: Home / Self Care | Attending: Family Medicine | Admitting: Family Medicine

## 2024-05-04 ENCOUNTER — Ambulatory Visit (INDEPENDENT_AMBULATORY_CARE_PROVIDER_SITE_OTHER): Admitting: Family Medicine

## 2024-05-04 ENCOUNTER — Encounter: Payer: Self-pay | Admitting: Family Medicine

## 2024-05-04 VITALS — BP 127/84 | HR 83 | Ht 76.0 in | Wt 181.0 lb

## 2024-05-04 DIAGNOSIS — Z136 Encounter for screening for cardiovascular disorders: Secondary | ICD-10-CM | POA: Diagnosis not present

## 2024-05-04 DIAGNOSIS — F1190 Opioid use, unspecified, uncomplicated: Secondary | ICD-10-CM | POA: Diagnosis not present

## 2024-05-04 DIAGNOSIS — N319 Neuromuscular dysfunction of bladder, unspecified: Secondary | ICD-10-CM

## 2024-05-04 DIAGNOSIS — M79662 Pain in left lower leg: Secondary | ICD-10-CM | POA: Diagnosis not present

## 2024-05-04 DIAGNOSIS — R252 Cramp and spasm: Secondary | ICD-10-CM | POA: Diagnosis not present

## 2024-05-04 DIAGNOSIS — J0110 Acute frontal sinusitis, unspecified: Secondary | ICD-10-CM | POA: Diagnosis not present

## 2024-05-04 DIAGNOSIS — G894 Chronic pain syndrome: Secondary | ICD-10-CM | POA: Diagnosis not present

## 2024-05-04 DIAGNOSIS — E039 Hypothyroidism, unspecified: Secondary | ICD-10-CM

## 2024-05-04 DIAGNOSIS — M7989 Other specified soft tissue disorders: Secondary | ICD-10-CM | POA: Insufficient documentation

## 2024-05-04 DIAGNOSIS — M19072 Primary osteoarthritis, left ankle and foot: Secondary | ICD-10-CM | POA: Diagnosis not present

## 2024-05-04 DIAGNOSIS — M85862 Other specified disorders of bone density and structure, left lower leg: Secondary | ICD-10-CM | POA: Diagnosis not present

## 2024-05-04 DIAGNOSIS — G8220 Paraplegia, unspecified: Secondary | ICD-10-CM

## 2024-05-04 MED ORDER — HYDROMORPHONE HCL 4 MG PO TABS: ORAL_TABLET | ORAL | 0 refills | Status: DC

## 2024-05-04 MED ORDER — METHADONE HCL 10 MG PO TABS: 20.0000 mg | ORAL_TABLET | Freq: Four times a day (QID) | ORAL | 0 refills | Status: DC

## 2024-05-04 MED ORDER — AMOXICILLIN 500 MG PO CAPS
1000.0000 mg | ORAL_CAPSULE | Freq: Two times a day (BID) | ORAL | 0 refills | Status: DC
Start: 2024-05-04 — End: 2024-07-07

## 2024-05-04 MED ORDER — FLUTICASONE PROPIONATE 50 MCG/ACT NA SUSP: 2.0000 | Freq: Every day | NASAL | 0 refills | Status: AC

## 2024-05-04 NOTE — Progress Notes (Signed)
 Established patient visit   Patient: Justin [name]   DOB: 1981/08/09   43 y.o. Male  MRN: [id number] Visit Date: 05/04/2024  Today's healthcare provider: Nancyann Perry, MD   Chief Complaint  Patient presents with   Chronic Care    Heard a pop in leg and would like an xray   Subjective    Discussed the use of AI scribe software for clinical note transcription with the patient, who gave verbal consent to proceed.  History of Present Illness   Justin [name] is a 43 year old male paraplegic who presents with a popping sound in his leg during stretching and follow up chronic pain medications.   He experienced a popping sound in his leg while stretching at around 3 AM, described as similar to a bone breaking. The sound was localized around the ankle area, and he is unsure if it was a bone or tendon issue. There was no swelling or discoloration. His pain is more controlled with an increased dose of baclofen  and a new medication, which he cannot recall the name of, but it includes sodium. He has not experienced any swelling in the foot since the incident.  He describes ongoing nasal congestion and a 'croupy cough' for about a month, which he attributes to a possible sinus infection, as his mother had a similar condition. His nasal discharge changed from clear to green and back to clear. He does not currently use any nasal sprays but has used them in the past. No shortness of breath.  He continues hydromorphone  and methadone  for pain management which remain effective. He is seeing   He has not been using Adderall recently as he has not been going out much, but he keeps it on hand if needed.       Medications: Outpatient Medications Prior to Visit  Medication Sig   albuterol  (VENTOLIN  HFA) 108 (90 Base) MCG/ACT inhaler Inhale 2 puffs into the lungs every 6 (six) hours as needed for wheezing or shortness of breath.   alprazolam  (XANAX ) 2 MG tablet  TAKE ONE TABLET EVERY SIX HOURS IF NEEDED FOR ANXIETY   amphetamine -dextroamphetamine  (ADDERALL) 15 MG tablet Take 1 tablet by mouth 2 (two) times daily.   bacitracin  ointment Apply topically 2 (two) times daily.   baclofen  (LIORESAL ) 20 MG tablet Take 1 tablet (20 mg total) by mouth 4 (four) times daily as needed for muscle spasms.   cephALEXin  (KEFLEX ) 250 MG capsule Take 250 mg by mouth every morning.   dantrolene  (DANTRIUM ) 100 MG capsule TAKE 1 CAPSULE BY MOUTH 3 TIMES DAILY   docusate sodium  (COLACE) 250 MG capsule Take 1 capsule (250 mg total) by mouth 4 (four) times daily as needed.   fesoterodine  (TOVIAZ ) 8 MG TB24 tablet TAKE 1 TABLET BY MOUTH DAILY   flavoxATE (URISPAS ) 100 MG tablet Take 1 tablet (100 mg total) by mouth 3 (three) times daily as needed for bladder spasms.   leptospermum manuka honey (MEDIHONEY) PSTE paste Apply 1 Application topically daily.   levothyroxine  (SYNTHROID ) 88 MCG tablet TAKE 1 TABLET BY MOUTH DAILY   linaclotide  (LINZESS ) 145 MCG CAPS capsule Take 145 mcg by mouth daily before breakfast.   lubiprostone  (AMITIZA ) 24 MCG capsule TAKE 1 CAPSULE BY MOUTH 2 TIMES DAILY WITH A MEAL. FOR OPIOID AND SPINAL CORD INJURY BASED CONSTIPATION.   meclizine  (ANTIVERT ) 25 MG tablet Take by mouth.   Mineral Oil OIL daily.   mirabegron ER (MYRBETRIQ) 25 MG TB24  tablet Take 50 mg by mouth daily. TAKE TWO TABLETS PO QD   montelukast  (SINGULAIR ) 10 MG tablet Take 1 tablet (10 mg total) by mouth daily.   mupirocin  ointment (BACTROBAN ) 2 % Apply 1 Application topically 2 (two) times daily.   naloxegol oxalate (MOVANTIK) 12.5 MG TABS tablet Take 12.5 mg by mouth daily.   naproxen  sodium (ANAPROX ) 550 MG tablet TAKE 1 TABLET BY MOUTH 2 TIMES DAILY WITH A MEAL.   omeprazole  (PRILOSEC) 20 MG capsule TAKE 1 CAPSULE BY MOUTH ONCE DAILY   oxybutynin (DITROPAN) 5 MG tablet Take 5 mg by mouth daily as needed.   pregabalin  (LYRICA ) 150 MG capsule Take one tablet two or three times a day  as needed   promethazine  (PHENERGAN ) 25 MG suppository Place 2 suppositories (50 mg total) rectally every 6 (six) hours as needed for up to 3 days for nausea or vomiting.   promethazine  (PHENERGAN ) 25 MG tablet TAKE 1 TABLET BY MOUTH EVERY 4 HOURS AS NEEDED FOR NAUSEA AND VOMITING   pyridostigmine (MESTINON) 60 MG tablet Take 60 mg by mouth once.  Take 1 tablet (60 mg total) by mouth Three (3) times a day. Start with 1/2 tablet in the morning and if tolerated well, can increase up to 3 times a day   sildenafil  (REVATIO ) 20 MG tablet TAKE 3 TO 5 TABLETS 2 HOURS BEFORE INTERCOURSE ON EMPTY STOMACH. DO NOT TAKE WITH NITRATES.   silver  sulfADIAZINE  (SSD) 1 % cream APPLY 2 GRAMS TO AFFECTED AREA TWICE DAILY APPLY TO AFFECTED AREAS TOPICALLY TWICE DAILY   sodium fluoride (DENTAGEL) 1.1 % GEL dental gel as needed.   tacrolimus  (PROTOPIC ) 0.1 % ointment After dressing change apply topically daily to rash at buttock and cover with bandage   tiZANidine  (ZANAFLEX ) 4 MG tablet TAKE 2 TABLETS BY MOUTH 4 TIMES DAILY   triamcinolone  ointment (KENALOG ) 0.5 % Apply twice a day to affected area on left hand   HYDROmorphone  (DILAUDID ) 4 MG tablet TAKE ONE TABLET BY MOUTH EVERY 4-5 HOURSAS NEEDED FOR PAIN. NO MORE THAN 5 TABS IN A DAY.   methadone  (DOLOPHINE ) 10 MG tablet Take 2 tablets (20 mg total) by mouth every 6 (six) hours.   No facility-administered medications prior to visit.   Review of Systems  Constitutional:  Negative for appetite change, chills and fever.  Respiratory:  Negative for chest tightness, shortness of breath and wheezing.   Cardiovascular:  Negative for chest pain and palpitations.  Gastrointestinal:  Negative for abdominal pain, nausea and vomiting.       Objective    BP 127/84 (BP Location: Right Arm, Patient Position: Sitting, Cuff Size: Large)   Pulse 83   Ht 6' 4 (1.93 m)   Wt 181 lb (82.1 kg)   SpO2 97%   BMI 22.03 kg/m   Physical Exam   General: Appearance:    Well  developed, well nourished male in no acute distress  Eyes:    PERRL, conjunctiva/corneas clear, EOM's intact       Lungs:     Clear to auscultation bilaterally, respirations unlabored  Heart:    Normal heart rate. Normal rhythm. No murmurs, rubs, or gallops.    MS:   All extremities are intact.    Neurologic:   Awake, alert, oriented x 3. No apparent focal neurological defect.        Assessment & Plan    1. Pain and swelling of left lower leg (Primary)  - DG Tibia/Fibula Left; Future  2. Hypothyroidism, unspecified type  - T4 AND TSH  3. Encounter for special screening examination for cardiovascular disorder  - Lipid panel  4. Acute non-recurrent frontal sinusitis  - amoxicillin  (AMOXIL ) 500 MG capsule; Take 2 capsules (1,000 mg total) by mouth 2 (two) times daily for 7 days.  Dispense: 28 capsule; Refill: 0 - fluticasone  (FLONASE ) 50 MCG/ACT nasal spray; Place 2 sprays into both nostrils daily.  Dispense: 16 g; Refill: 0  5. Chronic pain associated with significant psychosocial dysfunction Adequately controlled on current opioids. Continue current muscle relaxers managed by Dr. Arrie - CBC - HYDROmorphone  (DILAUDID ) 4 MG tablet; TAKE ONE TABLET BY MOUTH EVERY 4-5 HOURSAS NEEDED FOR PAIN. NO MORE THAN 5 TABS IN A DAY.  Dispense: 140 tablet; Refill: 0 - methadone  (DOLOPHINE ) 10 MG tablet; Take 2 tablets (20 mg total) by mouth every 6 (six) hours.  Dispense: 224 tablet; Refill: 0  6. Spasticity  Continue follow up Dr. Arrie  7. Neurogenic bladder/recurrant UTI  Continue follow up urology.   Nancyann Perry, MD  [facility] [phone] (phone) [phone] (fax)  [facility]

## 2024-05-04 NOTE — Patient Instructions (Signed)
 SABRA  Please review the attached list of medications and notify my office if there are any errors.   . Please bring all of your medications to every appointment so we can make sure that our medication list is the same as yours.

## 2024-05-04 NOTE — Telephone Encounter (Signed)
 He'll need to make an appointment about this.

## 2024-05-05 ENCOUNTER — Ambulatory Visit: Payer: Self-pay | Admitting: Family Medicine

## 2024-05-05 ENCOUNTER — Other Ambulatory Visit: Payer: Self-pay | Admitting: Family Medicine

## 2024-05-05 LAB — CBC
Hematocrit: 42.4 % (ref 37.5–51.0)
Hemoglobin: 14.1 g/dL (ref 13.0–17.7)
MCH: 28 pg (ref 26.6–33.0)
MCHC: 33.3 g/dL (ref 31.5–35.7)
MCV: 84 fL (ref 79–97)
Platelets: 144 x10E3/uL — ABNORMAL LOW (ref 150–450)
RBC: 5.03 x10E6/uL (ref 4.14–5.80)
RDW: 13.1 % (ref 11.6–15.4)
WBC: 4.9 x10E3/uL (ref 3.4–10.8)

## 2024-05-05 LAB — LIPID PANEL
Chol/HDL Ratio: 1.5 ratio (ref 0.0–5.0)
Cholesterol, Total: 83 mg/dL — ABNORMAL LOW (ref 100–199)
HDL: 54 mg/dL (ref >39)
LDL Chol Calc (NIH): 17 mg/dL (ref 0–99)
Triglycerides: 41 mg/dL (ref 0–149)
VLDL Cholesterol Cal: 12 mg/dL (ref 5–40)

## 2024-05-05 LAB — T4 AND TSH
T4, Total: 12.2 ug/dL — ABNORMAL HIGH (ref 4.5–12.0)
TSH: 1.02 u[IU]/mL (ref 0.450–4.500)

## 2024-05-08 ENCOUNTER — Telehealth: Payer: Self-pay

## 2024-05-08 NOTE — Telephone Encounter (Signed)
 Copied from CRM [id number]. Topic: Clinical - Lab/Test Results >> May 08, 2024 12:47 PM Tysheama G wrote: Reason for CRM: Patient still waiting on his results to see if he broke his ankle again and he stated he doesn't want to go to the orthopedic because his insurance will not pay for another xray. Patient wants someone to contact him on the results ASAP please callback number is [phone]

## 2024-05-08 NOTE — Telephone Encounter (Signed)
 Called and spoke to the pt to inform him of the message per Dr Gasper he has been advised to reach out to the office next week to check if the radiologist read the results. He verbally stated he understood

## 2024-05-08 NOTE — Telephone Encounter (Signed)
 No fractures that I could see, radiologist hasn't read it yet.

## 2024-05-08 NOTE — Telephone Encounter (Signed)
 Please see the message below.

## 2024-05-13 ENCOUNTER — Encounter: Payer: Self-pay | Admitting: Family Medicine

## 2024-05-15 NOTE — Telephone Encounter (Signed)
 Please contact [facility] to see when the xray from 05-04-2024 is going to be read.

## 2024-05-20 ENCOUNTER — Telehealth: Payer: Self-pay

## 2024-05-20 ENCOUNTER — Ambulatory Visit: Payer: Self-pay

## 2024-05-20 DIAGNOSIS — M7989 Other specified soft tissue disorders: Secondary | ICD-10-CM

## 2024-05-20 DIAGNOSIS — G8929 Other chronic pain: Secondary | ICD-10-CM

## 2024-05-20 NOTE — Telephone Encounter (Signed)
 There is no medical indication for a CAT scan, I could never get it approved. May be best to see orthopedist. We can place referral if he likes.

## 2024-05-20 NOTE — Telephone Encounter (Unsigned)
 Copied from CRM #[id number]. Topic: Clinical - Request for Lab/Test Order >> May 20, 2024 12:16 PM Larissa S wrote: Reason for CRM: Patient is requesting a CAT scan for his ankles. Patient requesting a callback.

## 2024-05-20 NOTE — Telephone Encounter (Signed)
 FYI Only or Action Required?: Action required by provider: clinical question for provider.  Patient was last seen in primary care on 05/04/2024 by Justin Nancyann BRAVO, MD.  Called Nurse Triage reporting Shoulder Pain.  Symptoms began several months ago.  Interventions attempted: OTC medications: Advil ; stretches.  Symptoms are: bilateral mild shoulder pain, left ankle pain unchanged.  Triage Disposition: Call PCP When Office is Open (overriding See PCP Within 2 Weeks)  Patient/caregiver understands and will follow disposition?:  Yes          Copied from CRM #[id number]. Topic: Clinical - Red Word Triage >> May 20, 2024 12:14 PM Shardie S wrote: [facility] that prompted transfer to Nurse Triage: b/l shoulder pain, Reason for Disposition  [1] MILD pain (e.g., does not interfere with normal activities) AND [2] present > 7 days  Answer Assessment - Initial Assessment Questions 1. ONSET: When did the pain start?     Off and on x 6 months.  2. LOCATION: Where is the pain located?     Both shoulders.  3. PAIN: How bad is the pain? (Scale 1-10; or mild, moderate, severe)     1/10, burning.  4. WORK OR EXERCISE: Has there been any recent work or exercise that involved this part of the body?     No.  5. CAUSE: What do you think is causing the shoulder pain?     He states he thinks this could be arthritis in his shoulders.  6. OTHER SYMPTOMS: Do you have any other symptoms? (e.g., neck pain, swelling, rash, fever, numbness, weakness)     He states he also has concerns about his ankle (left), states he heard a pop noise about a week ago when putting a shoe on and occurred again today when stretching. Denies chest pain, numbness/tingling, weakness, neck pain, swelling, rash, fever.  7. PREGNANCY: Is there any chance you are pregnant? When was your last menstrual period?     N/A.  Protocols used: Shoulder Pain-A-AH

## 2024-05-21 NOTE — Telephone Encounter (Signed)
 Pt advised. Verbalized understanding and ok with referral being placed. Please advise of appropriate dx for order Pt would also like to know how he can check to make sure no arthritis in his shoulders? Please advise Pt has been made aware provider is not in office today

## 2024-05-22 NOTE — Addendum Note (Signed)
 Addended by: GASPER NANCYANN BRAVO on: 05/22/2024 08:15 AM   Modules accepted: Orders

## 2024-05-26 DIAGNOSIS — R339 Retention of urine, unspecified: Secondary | ICD-10-CM | POA: Diagnosis not present

## 2024-06-02 ENCOUNTER — Other Ambulatory Visit: Payer: Self-pay | Admitting: Family Medicine

## 2024-06-02 ENCOUNTER — Other Ambulatory Visit: Payer: Self-pay | Admitting: Physical Medicine and Rehabilitation

## 2024-06-02 DIAGNOSIS — F1190 Opioid use, unspecified, uncomplicated: Secondary | ICD-10-CM

## 2024-06-02 DIAGNOSIS — G894 Chronic pain syndrome: Secondary | ICD-10-CM

## 2024-06-02 DIAGNOSIS — G8220 Paraplegia, unspecified: Secondary | ICD-10-CM

## 2024-06-03 ENCOUNTER — Telehealth: Payer: Self-pay | Admitting: Family Medicine

## 2024-06-03 NOTE — Telephone Encounter (Signed)
 Already requested on 06/02/24 by pharmacy in a separate encounter and routed to PCP for approval.

## 2024-06-03 NOTE — Telephone Encounter (Unsigned)
 Copied from CRM #[id number]. Topic: Clinical - Medication Refill >> Jun 03, 2024  1:06 PM Charlet HERO wrote: Medication: methadone  (DOLOPHINE ) 10 MG tablet HYDROmorphone  (DILAUDID ) 4 MG tablet  Has the patient contacted their pharmacy? Yes 0 refills  This is the patient's preferred pharmacy:  [facility] - [place], [place] - [address] [place] [address] Phone: [phone] Fax: [phone]    Is this the correct pharmacy for this prescription? Yes If no, delete pharmacy and type the correct one.   Has the prescription been filled recently? Yes  Is the patient out of the medication? Yes  Has the patient been seen for an appointment in the last year OR does the patient have an upcoming appointment? Yes  Can we respond through MyChart? Yes  Agent: Please be advised that Rx refills may take up to 3 business days. We ask that you follow-up with your pharmacy.

## 2024-06-09 ENCOUNTER — Other Ambulatory Visit (HOSPITAL_COMMUNITY): Payer: Self-pay

## 2024-06-09 ENCOUNTER — Other Ambulatory Visit: Payer: Self-pay

## 2024-06-11 ENCOUNTER — Other Ambulatory Visit: Payer: Self-pay

## 2024-06-11 DIAGNOSIS — M25511 Pain in right shoulder: Secondary | ICD-10-CM

## 2024-06-15 ENCOUNTER — Ambulatory Visit

## 2024-06-23 DIAGNOSIS — N319 Neuromuscular dysfunction of bladder, unspecified: Secondary | ICD-10-CM | POA: Diagnosis not present

## 2024-06-24 ENCOUNTER — Other Ambulatory Visit (HOSPITAL_COMMUNITY): Payer: Self-pay

## 2024-06-24 ENCOUNTER — Other Ambulatory Visit: Payer: Self-pay | Admitting: Family Medicine

## 2024-06-24 DIAGNOSIS — G8220 Paraplegia, unspecified: Secondary | ICD-10-CM

## 2024-06-24 DIAGNOSIS — G894 Chronic pain syndrome: Secondary | ICD-10-CM

## 2024-06-24 DIAGNOSIS — F1190 Opioid use, unspecified, uncomplicated: Secondary | ICD-10-CM

## 2024-06-25 DIAGNOSIS — R339 Retention of urine, unspecified: Secondary | ICD-10-CM | POA: Diagnosis not present

## 2024-06-26 ENCOUNTER — Other Ambulatory Visit: Payer: Self-pay

## 2024-06-26 ENCOUNTER — Telehealth: Payer: Self-pay

## 2024-06-26 NOTE — Telephone Encounter (Signed)
 error

## 2024-07-04 ENCOUNTER — Encounter: Payer: Self-pay | Admitting: Family Medicine

## 2024-07-04 DIAGNOSIS — J0110 Acute frontal sinusitis, unspecified: Secondary | ICD-10-CM

## 2024-07-07 MED ORDER — AMOXICILLIN 500 MG PO CAPS: 1000.0000 mg | ORAL_CAPSULE | Freq: Two times a day (BID) | ORAL | 0 refills | Status: AC

## 2024-07-08 ENCOUNTER — Telehealth: Payer: Self-pay

## 2024-07-08 DIAGNOSIS — N390 Urinary tract infection, site not specified: Secondary | ICD-10-CM

## 2024-07-08 NOTE — Telephone Encounter (Unsigned)
 Copied from CRM #[id number]. Topic: General - Call Back - No Documentation >> Jul 08, 2024  1:41 PM Justin [name] wrote: Reason for CRM: patient is calling saying he has a bladder infection and needs an order to drop of urine sample please call pt back to let him know when he can bring  the sample [phone] (M)

## 2024-07-09 NOTE — Telephone Encounter (Signed)
 Patient advised.

## 2024-07-09 NOTE — Telephone Encounter (Signed)
 He can drop off sample today and send to lab for urinalysis, and culture and sensitivity.

## 2024-07-10 DIAGNOSIS — N390 Urinary tract infection, site not specified: Secondary | ICD-10-CM | POA: Diagnosis not present

## 2024-07-11 LAB — URINALYSIS, ROUTINE W REFLEX MICROSCOPIC
Bilirubin, UA: NEGATIVE
Glucose, UA: NEGATIVE
Ketones, UA: NEGATIVE
Nitrite, UA: NEGATIVE
Protein,UA: NEGATIVE
RBC, UA: NEGATIVE
Specific Gravity, UA: 1.022 (ref 1.005–1.030)
Urobilinogen, Ur: 0.2 mg/dL (ref 0.2–1.0)
pH, UA: 7.5 (ref 5.0–7.5)

## 2024-07-11 LAB — SPECIMEN STATUS REPORT

## 2024-07-11 LAB — MICROSCOPIC EXAMINATION
Bacteria, UA: NONE SEEN
Casts: NONE SEEN /LPF
RBC, Urine: NONE SEEN /HPF (ref 0–2)

## 2024-07-12 LAB — SPECIMEN STATUS REPORT

## 2024-07-12 LAB — URINE CULTURE: Organism ID, Bacteria: NO GROWTH

## 2024-07-15 ENCOUNTER — Ambulatory Visit: Payer: Self-pay | Admitting: Family Medicine

## 2024-07-20 ENCOUNTER — Encounter: Payer: Self-pay | Admitting: Radiology

## 2024-07-24 ENCOUNTER — Other Ambulatory Visit: Payer: Self-pay | Admitting: Family Medicine

## 2024-07-24 DIAGNOSIS — G894 Chronic pain syndrome: Secondary | ICD-10-CM

## 2024-07-24 DIAGNOSIS — G8220 Paraplegia, unspecified: Secondary | ICD-10-CM

## 2024-07-24 DIAGNOSIS — F1190 Opioid use, unspecified, uncomplicated: Secondary | ICD-10-CM

## 2024-07-27 ENCOUNTER — Other Ambulatory Visit: Payer: Self-pay | Admitting: Family Medicine

## 2024-07-27 ENCOUNTER — Other Ambulatory Visit: Payer: Self-pay | Admitting: Physical Medicine and Rehabilitation

## 2024-07-27 DIAGNOSIS — G8220 Paraplegia, unspecified: Secondary | ICD-10-CM

## 2024-07-28 DIAGNOSIS — R339 Retention of urine, unspecified: Secondary | ICD-10-CM | POA: Diagnosis not present

## 2024-08-10 ENCOUNTER — Ambulatory Visit: Payer: Self-pay

## 2024-08-10 ENCOUNTER — Encounter: Payer: Self-pay | Admitting: Family Medicine

## 2024-08-10 DIAGNOSIS — N390 Urinary tract infection, site not specified: Secondary | ICD-10-CM

## 2024-08-10 NOTE — Telephone Encounter (Signed)
 Ok to drop sample for u/a and culture.

## 2024-08-10 NOTE — Telephone Encounter (Signed)
 FYI Only or Action Required?: Action required by provider: update on patient condition and lab or test result follow-up needed. Needs UA ordered  Patient was last seen in primary care on 05/04/2024 by Gasper Nancyann BRAVO, MD.  Called Nurse Triage reporting Urinary Frequency.  Symptoms began a week ago.  Interventions attempted: Prescription medications: Keflex .  Symptoms are: gradually worsening.  Triage Disposition: See Physician Within 24 Hours  Patient/caregiver understands and will follow disposition?: No, wishes to speak with PCP  Copied from CRM #[id number]. Topic: General - Other >> Aug 10, 2024  9:37 AM Alfonso HERO wrote: Reason for CRM: patient is getting a bladder infection needing Dr Gasper to send order for urine sample Reason for Disposition  Bad or foul-smelling urine  Answer Assessment - Initial Assessment Questions Patient with chronic conditions- Frequent UTIs- on prophylactic Keflex  Low grade temp all the time due to autonomic dysfunction Concern for Bladder infection- sulfer odor, cloudy tea colored urine, and burning in bladder and urethra.   Urologist and Dr Gasper have patient call for orders for urine sample to test for antibiotics needs. Pharmacy confirmed. Just need order for UA.   1. SYMPTOM: What's the main symptom you're concerned about? (e.g., frequency, incontinence)     Odor, cloudy tea colored urine 2. ONSET: When did the  odor, pain  start?     Last month but worse the last week- odor started 2 days ago  3. PAIN: Is there any pain? If Yes, ask: How bad is it? (Scale: 1-10; mild, moderate, severe)     Burning in bladder and urethra- 5/10  4. CAUSE: What do you think is causing the symptoms?     UTI  5. OTHER SYMPTOMS: Do you have any other symptoms? (e.g., blood in urine, fever, flank pain, pain with urination)     Burning with urination  Protocols used: Urinary Symptoms-A-AH

## 2024-08-10 NOTE — Telephone Encounter (Signed)
 Ok to drop of sample for u/a and culture

## 2024-08-11 ENCOUNTER — Other Ambulatory Visit: Payer: Self-pay | Admitting: Family Medicine

## 2024-08-11 DIAGNOSIS — N390 Urinary tract infection, site not specified: Secondary | ICD-10-CM | POA: Diagnosis not present

## 2024-08-11 NOTE — Addendum Note (Signed)
 Addended by: Atlas Kuc E on: 08/11/2024 09:27 AM   Modules accepted: Orders

## 2024-08-11 NOTE — Telephone Encounter (Signed)
 Left detailed message for patient to drop off urine per Dr.Fisher. I have also sent that message through my chart.

## 2024-08-16 LAB — URINE CULTURE

## 2024-08-16 LAB — MICROSCOPIC EXAMINATION
Casts: NONE SEEN /LPF
Epithelial Cells (non renal): NONE SEEN /HPF (ref 0–10)
RBC, Urine: >30 /HPF — AB (ref 0–2)
WBC, UA: >30 /HPF — AB (ref 0–5)

## 2024-08-16 LAB — URINALYSIS, ROUTINE W REFLEX MICROSCOPIC
Glucose, UA: NEGATIVE
Ketones, UA: NEGATIVE
Nitrite, UA: NEGATIVE
Specific Gravity, UA: 1.021 (ref 1.005–1.030)
Urobilinogen, Ur: 1 mg/dL (ref 0.2–1.0)
pH, UA: 6 (ref 5.0–7.5)

## 2024-08-17 ENCOUNTER — Ambulatory Visit: Payer: Self-pay | Admitting: Family Medicine

## 2024-08-17 DIAGNOSIS — N3000 Acute cystitis without hematuria: Secondary | ICD-10-CM

## 2024-08-17 MED ORDER — SULFAMETHOXAZOLE-TRIMETHOPRIM 800-160 MG PO TABS: 1.0000 | ORAL_TABLET | Freq: Two times a day (BID) | ORAL | 0 refills | Status: AC

## 2024-08-20 ENCOUNTER — Encounter: Payer: Self-pay | Admitting: Family Medicine

## 2024-08-24 MED ORDER — ALPRAZOLAM 2 MG PO TABS: 1.0000 mg | ORAL_TABLET | Freq: Four times a day (QID) | ORAL | Status: AC | PRN

## 2024-08-25 ENCOUNTER — Other Ambulatory Visit: Payer: Self-pay | Admitting: Family Medicine

## 2024-08-25 DIAGNOSIS — G894 Chronic pain syndrome: Secondary | ICD-10-CM

## 2024-08-25 DIAGNOSIS — F1190 Opioid use, unspecified, uncomplicated: Secondary | ICD-10-CM

## 2024-08-25 DIAGNOSIS — G8220 Paraplegia, unspecified: Secondary | ICD-10-CM

## 2024-09-01 ENCOUNTER — Other Ambulatory Visit: Payer: Self-pay | Admitting: Family Medicine

## 2024-09-02 ENCOUNTER — Encounter: Payer: Self-pay | Admitting: Emergency Medicine

## 2024-09-02 ENCOUNTER — Emergency Department
Admission: EM | Admit: 2024-09-02 | Discharge: 2024-09-02 | Disposition: A | Discharge status: Home / Self Care | Attending: Emergency Medicine | Admitting: Emergency Medicine

## 2024-09-02 ENCOUNTER — Emergency Department

## 2024-09-02 ENCOUNTER — Other Ambulatory Visit: Payer: Self-pay

## 2024-09-02 DIAGNOSIS — R112 Nausea with vomiting, unspecified: Secondary | ICD-10-CM | POA: Diagnosis not present

## 2024-09-02 DIAGNOSIS — R1110 Vomiting, unspecified: Secondary | ICD-10-CM | POA: Diagnosis not present

## 2024-09-02 DIAGNOSIS — J45909 Unspecified asthma, uncomplicated: Secondary | ICD-10-CM | POA: Diagnosis not present

## 2024-09-02 DIAGNOSIS — R0602 Shortness of breath: Secondary | ICD-10-CM | POA: Diagnosis not present

## 2024-09-02 LAB — BASIC METABOLIC PANEL WITH GFR
Anion gap: 9 (ref 5–15)
BUN: 14 mg/dL (ref 6–20)
CO2: 28 mmol/L (ref 22–32)
Calcium: 9.5 mg/dL (ref 8.9–10.3)
Chloride: 102 mmol/L (ref 98–111)
Creatinine, Ser: 0.75 mg/dL (ref 0.61–1.24)
GFR, Estimated: >60 mL/min (ref >60)
Glucose, Bld: 138 mg/dL — ABNORMAL HIGH (ref 70–99)
Potassium: 4.1 mmol/L (ref 3.5–5.1)
Sodium: 139 mmol/L (ref 135–145)

## 2024-09-02 LAB — CBC
HCT: 43.9 % (ref 39.0–52.0)
Hemoglobin: 14.5 g/dL (ref 13.0–17.0)
MCH: 28.5 pg (ref 26.0–34.0)
MCHC: 33 g/dL (ref 30.0–36.0)
MCV: 86.2 fL (ref 80.0–100.0)
Platelets: 148 K/uL — ABNORMAL LOW (ref 150–400)
RBC: 5.09 MIL/uL (ref 4.22–5.81)
RDW: 13.2 % (ref 11.5–15.5)
WBC: 5.7 K/uL (ref 4.0–10.5)
nRBC: 0 % (ref 0.0–0.2)

## 2024-09-02 NOTE — ED Notes (Signed)
 Attempted blood

## 2024-09-02 NOTE — ED Provider Notes (Signed)
 [facility] Provider Note    Event Date/Time   First MD Initiated Contact with Patient 09/02/24 [phone]     (approximate)  History   Chief Complaint: Nausea and Aspiration  HPI  Justin [name] is a 43 y.o. male with a past medical history of paraplegia, asthma, arthritis, anxiety, presents to the emergency department for possible aspiration.  According to the patient he was taking his medications this morning when he vomited.  Patient states when he vomited he felt like he may have aspirated some into his lungs.  He states he felt a burning sensation in his throat but it has since largely gone away.  Patient states he was coughing at the time but denies any coughing currently.  No fever.  This event occurred approximately 1.5 hours ago.  Physical Exam   Triage Vital Signs: ED Triage Vitals  Encounter Vitals Group     BP 09/02/24 0747 135/76     Girls Systolic BP Percentile --      Girls Diastolic BP Percentile --      Boys Systolic BP Percentile --      Boys Diastolic BP Percentile --      Pulse Rate 09/02/24 0747 74     Resp 09/02/24 0747 18     Temp 09/02/24 0747 98.4 F (36.9 C)     Temp Source 09/02/24 0747 Oral     SpO2 09/02/24 0747 97 %     Weight 09/02/24 0745 170 lb (77.1 kg)     Height 09/02/24 0745 6' 4 (1.93 m)     Head Circumference --      Peak Flow --      Pain Score 09/02/24 0745 3     Pain Loc --      Pain Education --      Exclude from Growth Chart --     Most recent vital signs: Vitals:   09/02/24 0747  BP: 135/76  Pulse: 74  Resp: 18  Temp: 98.4 F (36.9 C)  SpO2: 97%    General: Awake, no distress.  CV:  Good peripheral perfusion.  Regular rate and rhythm  Resp:  Normal effort.  Equal breath sounds bilaterally.  Abd:  No distention.  Soft, nontender.   ED Results / Procedures / Treatments   RADIOLOGY  I reviewed interpret the chest x-ray images.  No consolidation seen on my evaluation. Radiology has  read the x-ray as negative.   MEDICATIONS ORDERED IN ED: Medications - No data to display   IMPRESSION / MDM / ASSESSMENT AND PLAN / ED COURSE  I reviewed the triage vital signs and the nursing notes.  Patient's presentation is most consistent with acute presentation with potential threat to life or bodily function.  Patient presents to the emergency department for possible aspiration.  States he was taking his medication this morning when he vomited felt a burning sensation in his throat and is worried that he may have accidentally aspirated some amount.  Currently the patient appears well, no distress.  Vital signs are reassuring.  Physical exam is reassuring with clear sounding lungs.  Will check labs we will obtain a chest x-ray.  Will continue to closely monitor while awaiting further results.  Patient agreeable to plan of care.  Patient's workup is reassuring with a normal CBC normal chemistry, chest x-ray is clear.  No clinical concern for aspiration.  Given the reassuring x-ray discussed supportive care at home and close monitoring to return to  the emergency department should the patient develop a fever.  FINAL CLINICAL IMPRESSION(S) / ED DIAGNOSES   Emesis   Note:  This document was prepared using Dragon voice recognition software and may include unintentional dictation errors.   Dorothyann Drivers, MD 09/02/24 [phone]

## 2024-09-02 NOTE — ED Triage Notes (Signed)
 Pt via POV from home. Pt c/o nausea, which is normal for the patient, and states this morning he took his morning meds reports he vomited it back up and may have possible aspirated, reports he has some burning in his throat. Denies CP/SOB. Pt is A&Ox4 and NAD

## 2024-09-03 ENCOUNTER — Other Ambulatory Visit (HOSPITAL_COMMUNITY): Payer: Self-pay

## 2024-09-04 ENCOUNTER — Telehealth: Payer: Self-pay

## 2024-09-04 ENCOUNTER — Other Ambulatory Visit: Payer: Self-pay

## 2024-09-04 ENCOUNTER — Other Ambulatory Visit (HOSPITAL_COMMUNITY): Payer: Self-pay

## 2024-09-04 DIAGNOSIS — T796XXA Traumatic ischemia of muscle, initial encounter: Secondary | ICD-10-CM | POA: Diagnosis present

## 2024-09-04 DIAGNOSIS — M62838 Other muscle spasm: Secondary | ICD-10-CM | POA: Diagnosis present

## 2024-09-04 DIAGNOSIS — Z8249 Family history of ischemic heart disease and other diseases of the circulatory system: Secondary | ICD-10-CM

## 2024-09-04 DIAGNOSIS — M6282 Rhabdomyolysis: Secondary | ICD-10-CM | POA: Diagnosis not present

## 2024-09-04 DIAGNOSIS — G894 Chronic pain syndrome: Secondary | ICD-10-CM | POA: Diagnosis present

## 2024-09-04 DIAGNOSIS — Z8701 Personal history of pneumonia (recurrent): Secondary | ICD-10-CM

## 2024-09-04 DIAGNOSIS — Z79899 Other long term (current) drug therapy: Secondary | ICD-10-CM

## 2024-09-04 DIAGNOSIS — R0902 Hypoxemia: Secondary | ICD-10-CM | POA: Diagnosis present

## 2024-09-04 DIAGNOSIS — E876 Hypokalemia: Secondary | ICD-10-CM | POA: Diagnosis present

## 2024-09-04 DIAGNOSIS — F32A Depression, unspecified: Secondary | ICD-10-CM | POA: Diagnosis present

## 2024-09-04 DIAGNOSIS — E860 Dehydration: Secondary | ICD-10-CM | POA: Diagnosis not present

## 2024-09-04 DIAGNOSIS — Z87440 Personal history of urinary (tract) infections: Secondary | ICD-10-CM

## 2024-09-04 DIAGNOSIS — Z87891 Personal history of nicotine dependence: Secondary | ICD-10-CM

## 2024-09-04 DIAGNOSIS — J45909 Unspecified asthma, uncomplicated: Secondary | ICD-10-CM | POA: Diagnosis present

## 2024-09-04 DIAGNOSIS — J101 Influenza due to other identified influenza virus with other respiratory manifestations: Secondary | ICD-10-CM | POA: Diagnosis not present

## 2024-09-04 DIAGNOSIS — F419 Anxiety disorder, unspecified: Secondary | ICD-10-CM | POA: Diagnosis present

## 2024-09-04 DIAGNOSIS — J1000 Influenza due to other identified influenza virus with unspecified type of pneumonia: Principal | ICD-10-CM | POA: Diagnosis present

## 2024-09-04 DIAGNOSIS — R748 Abnormal levels of other serum enzymes: Secondary | ICD-10-CM | POA: Diagnosis present

## 2024-09-04 DIAGNOSIS — G8220 Paraplegia, unspecified: Secondary | ICD-10-CM | POA: Diagnosis present

## 2024-09-04 DIAGNOSIS — X58XXXA Exposure to other specified factors, initial encounter: Secondary | ICD-10-CM | POA: Diagnosis present

## 2024-09-04 DIAGNOSIS — Z79890 Hormone replacement therapy (postmenopausal): Secondary | ICD-10-CM

## 2024-09-04 DIAGNOSIS — Z993 Dependence on wheelchair: Secondary | ICD-10-CM

## 2024-09-04 LAB — CBC
HCT: 41.5 % (ref 39.0–52.0)
Hemoglobin: 13.7 g/dL (ref 13.0–17.0)
MCH: 28.7 pg (ref 26.0–34.0)
MCHC: 33 g/dL (ref 30.0–36.0)
MCV: 87 fL (ref 80.0–100.0)
Platelets: 132 K/uL — ABNORMAL LOW (ref 150–400)
RBC: 4.77 MIL/uL (ref 4.22–5.81)
RDW: 13.2 % (ref 11.5–15.5)
WBC: 5.9 K/uL (ref 4.0–10.5)
nRBC: 0 % (ref 0.0–0.2)

## 2024-09-04 NOTE — ED Triage Notes (Signed)
 Pt reports he has been dealing with muscle spams for the past 4 days. Pt reports usually whe he developes these spams is a way his body reacts when he develops a bladder infection due to self catheterization.

## 2024-09-04 NOTE — Telephone Encounter (Signed)
 Copied from CRM #[id number]. Topic: Clinical - Prescription Issue >> Sep 04, 2024  3:54 PM Justin [name] wrote: Reason for CRM: Patient called. Wanted to leave Message for Dr Gasper to let him know that he went to ED. They prescribed promethazine  (PHENERGAN ) 25 MG tablet but he told pharmacy not to fill it. Does not want Dr gasper to think he is going to other doctors trying to get medication. Thank You

## 2024-09-05 ENCOUNTER — Inpatient Hospital Stay
Admission: EM | Admit: 2024-09-05 | Discharge: 2024-09-08 | DRG: 194 | Disposition: A | Discharge status: Home / Self Care | Attending: Internal Medicine | Admitting: Internal Medicine

## 2024-09-05 ENCOUNTER — Emergency Department

## 2024-09-05 ENCOUNTER — Encounter: Payer: Self-pay | Admitting: Emergency Medicine

## 2024-09-05 DIAGNOSIS — J111 Influenza due to unidentified influenza virus with other respiratory manifestations: Secondary | ICD-10-CM | POA: Diagnosis not present

## 2024-09-05 DIAGNOSIS — R252 Cramp and spasm: Secondary | ICD-10-CM

## 2024-09-05 DIAGNOSIS — M62838 Other muscle spasm: Secondary | ICD-10-CM | POA: Diagnosis not present

## 2024-09-05 DIAGNOSIS — M6282 Rhabdomyolysis: Secondary | ICD-10-CM

## 2024-09-05 DIAGNOSIS — F419 Anxiety disorder, unspecified: Secondary | ICD-10-CM | POA: Diagnosis not present

## 2024-09-05 DIAGNOSIS — E860 Dehydration: Secondary | ICD-10-CM

## 2024-09-05 DIAGNOSIS — J101 Influenza due to other identified influenza virus with other respiratory manifestations: Secondary | ICD-10-CM

## 2024-09-05 DIAGNOSIS — J09X1 Influenza due to identified novel influenza A virus with pneumonia: Secondary | ICD-10-CM | POA: Diagnosis present

## 2024-09-05 DIAGNOSIS — F32A Depression, unspecified: Secondary | ICD-10-CM | POA: Diagnosis present

## 2024-09-05 DIAGNOSIS — T796XXA Traumatic ischemia of muscle, initial encounter: Secondary | ICD-10-CM

## 2024-09-05 DIAGNOSIS — J069 Acute upper respiratory infection, unspecified: Secondary | ICD-10-CM

## 2024-09-05 DIAGNOSIS — G894 Chronic pain syndrome: Secondary | ICD-10-CM | POA: Diagnosis present

## 2024-09-05 DIAGNOSIS — M79604 Pain in right leg: Secondary | ICD-10-CM | POA: Diagnosis not present

## 2024-09-05 DIAGNOSIS — Z03822 Encounter for observation for suspected aspirated (inhaled) foreign body ruled out: Secondary | ICD-10-CM | POA: Diagnosis not present

## 2024-09-05 DIAGNOSIS — M79605 Pain in left leg: Secondary | ICD-10-CM | POA: Diagnosis not present

## 2024-09-05 HISTORY — DX: Rhabdomyolysis: M62.82

## 2024-09-05 LAB — RESP PANEL BY RT-PCR (RSV, FLU A&B, COVID)  RVPGX2
Influenza A by PCR: POSITIVE — AB
Influenza B by PCR: NEGATIVE
Resp Syncytial Virus by PCR: NEGATIVE
SARS Coronavirus 2 by RT PCR: NEGATIVE

## 2024-09-05 LAB — CBC
HCT: 44.8 % (ref 39.0–52.0)
Hemoglobin: 14.7 g/dL (ref 13.0–17.0)
MCH: 28.4 pg (ref 26.0–34.0)
MCHC: 32.8 g/dL (ref 30.0–36.0)
MCV: 86.7 fL (ref 80.0–100.0)
Platelets: 129 K/uL — ABNORMAL LOW (ref 150–400)
RBC: 5.17 MIL/uL (ref 4.22–5.81)
RDW: 13.2 % (ref 11.5–15.5)
WBC: 5.4 K/uL (ref 4.0–10.5)
nRBC: 0 % (ref 0.0–0.2)

## 2024-09-05 LAB — COMPREHENSIVE METABOLIC PANEL WITH GFR
ALT: 34 U/L (ref 0–44)
AST: 45 U/L — ABNORMAL HIGH (ref 15–41)
Albumin: 4.5 g/dL (ref 3.5–5.0)
Alkaline Phosphatase: 62 U/L (ref 38–126)
Anion gap: 16 — ABNORMAL HIGH (ref 5–15)
BUN: 11 mg/dL (ref 6–20)
CO2: 23 mmol/L (ref 22–32)
Calcium: 9.3 mg/dL (ref 8.9–10.3)
Chloride: 99 mmol/L (ref 98–111)
Creatinine, Ser: 0.82 mg/dL (ref 0.61–1.24)
GFR, Estimated: >60 mL/min
Glucose, Bld: 75 mg/dL (ref 70–99)
Potassium: 4.3 mmol/L (ref 3.5–5.1)
Sodium: 138 mmol/L (ref 135–145)
Total Bilirubin: 0.4 mg/dL (ref 0.0–1.2)
Total Protein: 7 g/dL (ref 6.5–8.1)

## 2024-09-05 LAB — MAGNESIUM
Magnesium: 1.9 mg/dL (ref 1.7–2.4)
Magnesium: 2 mg/dL (ref 1.7–2.4)

## 2024-09-05 LAB — URINALYSIS, ROUTINE W REFLEX MICROSCOPIC
Bilirubin Urine: NEGATIVE
Glucose, UA: NEGATIVE mg/dL
Hgb urine dipstick: NEGATIVE
Ketones, ur: 5 mg/dL — AB
Leukocytes,Ua: NEGATIVE
Nitrite: NEGATIVE
Protein, ur: NEGATIVE mg/dL
Specific Gravity, Urine: 1.016 (ref 1.005–1.030)
pH: 7 (ref 5.0–8.0)

## 2024-09-05 LAB — CK: Total CK: 918 U/L — ABNORMAL HIGH (ref 49–397)

## 2024-09-05 LAB — CREATININE, SERUM
Creatinine, Ser: 0.9 mg/dL (ref 0.61–1.24)
GFR, Estimated: >60 mL/min

## 2024-09-05 LAB — PHOSPHORUS: Phosphorus: 3.6 mg/dL (ref 2.5–4.6)

## 2024-09-05 MED ORDER — TIZANIDINE HCL 4 MG PO TABS
8.0000 mg | ORAL_TABLET | Freq: Four times a day (QID) | ORAL | Status: DC | PRN
  Administered 2024-09-05 – 2024-09-08 (×5): 8 mg via ORAL
  Filled 2024-09-05: qty 2
  Filled 2024-09-05: qty 4
  Filled 2024-09-05 (×4): qty 2
  Filled 2024-09-05: qty 4
  Filled 2024-09-05: qty 2

## 2024-09-05 MED ORDER — ALBUTEROL SULFATE (2.5 MG/3ML) 0.083% IN NEBU: 3.0000 mL | INHALATION_SOLUTION | Freq: Four times a day (QID) | RESPIRATORY_TRACT | Status: DC | PRN

## 2024-09-05 MED ORDER — ONDANSETRON HCL 4 MG PO TABS
4.0000 mg | ORAL_TABLET | Freq: Four times a day (QID) | ORAL | Status: DC | PRN
  Administered 2024-09-06: 4 mg via ORAL
  Filled 2024-09-05: qty 1

## 2024-09-05 MED ORDER — SILVER SULFADIAZINE 1 % EX CREA
TOPICAL_CREAM | Freq: Two times a day (BID) | CUTANEOUS | Status: DC
  Administered 2024-09-07: 1 via TOPICAL
  Filled 2024-09-05 (×2): qty 85

## 2024-09-05 MED ORDER — LACTATED RINGERS IV SOLN: INTRAVENOUS | Status: DC

## 2024-09-05 MED ORDER — ONDANSETRON HCL 4 MG/2ML IJ SOLN
4.0000 mg | Freq: Four times a day (QID) | INTRAMUSCULAR | Status: DC | PRN
  Administered 2024-09-05: 4 mg via INTRAVENOUS
  Filled 2024-09-05: qty 2

## 2024-09-05 MED ORDER — FLUTICASONE PROPIONATE 50 MCG/ACT NA SUSP
2.0000 | Freq: Every day | NASAL | Status: DC
  Filled 2024-09-05: qty 16

## 2024-09-05 MED ORDER — DANTROLENE SODIUM 100 MG PO CAPS
100.0000 mg | ORAL_CAPSULE | Freq: Once | ORAL | Status: AC
  Administered 2024-09-05: 100 mg via ORAL
  Filled 2024-09-05: qty 1

## 2024-09-05 MED ORDER — OXYBUTYNIN CHLORIDE 5 MG PO TABS: 5.0000 mg | ORAL_TABLET | Freq: Every day | ORAL | Status: DC | PRN

## 2024-09-05 MED ORDER — ACETAMINOPHEN 325 MG PO TABS: 650.0000 mg | ORAL_TABLET | Freq: Four times a day (QID) | ORAL | Status: DC | PRN

## 2024-09-05 MED ORDER — SODIUM CHLORIDE 0.9% FLUSH: 3.0000 mL | INTRAVENOUS | Status: DC | PRN

## 2024-09-05 MED ORDER — DOCUSATE SODIUM 100 MG PO CAPS
200.0000 mg | ORAL_CAPSULE | Freq: Two times a day (BID) | ORAL | Status: DC
  Administered 2024-09-05 – 2024-09-08 (×6): 200 mg via ORAL
  Filled 2024-09-05 (×6): qty 2

## 2024-09-05 MED ORDER — HYDROMORPHONE HCL 1 MG/ML IJ SOLN
0.5000 mg | Freq: Once | INTRAMUSCULAR | Status: AC
  Administered 2024-09-05: 0.5 mg via INTRAVENOUS
  Filled 2024-09-05: qty 0.5

## 2024-09-05 MED ORDER — BACLOFEN 10 MG PO TABS
20.0000 mg | ORAL_TABLET | Freq: Three times a day (TID) | ORAL | Status: DC
  Administered 2024-09-05 – 2024-09-06 (×4): 20 mg via ORAL
  Filled 2024-09-05 (×4): qty 2

## 2024-09-05 MED ORDER — ACETAMINOPHEN 650 MG RE SUPP: 650.0000 mg | Freq: Four times a day (QID) | RECTAL | Status: DC | PRN

## 2024-09-05 MED ORDER — SODIUM CHLORIDE 0.9 % IV SOLN: 250.0000 mL | INTRAVENOUS | Status: AC | PRN

## 2024-09-05 MED ORDER — DANTROLENE SODIUM 100 MG PO CAPS
100.0000 mg | ORAL_CAPSULE | Freq: Three times a day (TID) | ORAL | Status: DC
  Administered 2024-09-05 – 2024-09-08 (×7): 100 mg via ORAL
  Filled 2024-09-05 (×12): qty 1

## 2024-09-05 MED ORDER — BACLOFEN 10 MG PO TABS
40.0000 mg | ORAL_TABLET | Freq: Three times a day (TID) | ORAL | Status: AC
  Administered 2024-09-05: 40 mg via ORAL
  Filled 2024-09-05: qty 4

## 2024-09-05 MED ORDER — LINACLOTIDE 145 MCG PO CAPS
145.0000 ug | ORAL_CAPSULE | Freq: Every day | ORAL | Status: DC
  Administered 2024-09-06 – 2024-09-08 (×3): 145 ug via ORAL
  Filled 2024-09-05 (×3): qty 1

## 2024-09-05 MED ORDER — HYDROMORPHONE HCL 2 MG PO TABS
4.0000 mg | ORAL_TABLET | Freq: Four times a day (QID) | ORAL | Status: DC | PRN
  Administered 2024-09-05: 4 mg via ORAL
  Filled 2024-09-05: qty 2

## 2024-09-05 MED ORDER — HYDROMORPHONE HCL 2 MG PO TABS
4.0000 mg | ORAL_TABLET | ORAL | Status: DC | PRN
  Administered 2024-09-05 – 2024-09-08 (×16): 4 mg via ORAL
  Filled 2024-09-05 (×16): qty 2

## 2024-09-05 MED ORDER — PREGABALIN 75 MG PO CAPS
150.0000 mg | ORAL_CAPSULE | Freq: Three times a day (TID) | ORAL | Status: DC
  Administered 2024-09-05 – 2024-09-08 (×9): 150 mg via ORAL
  Filled 2024-09-05 (×2): qty 3
  Filled 2024-09-05: qty 2
  Filled 2024-09-05: qty 3
  Filled 2024-09-05: qty 2
  Filled 2024-09-05: qty 3
  Filled 2024-09-05 (×3): qty 2

## 2024-09-05 MED ORDER — ALPRAZOLAM 0.5 MG PO TABS
1.0000 mg | ORAL_TABLET | Freq: Four times a day (QID) | ORAL | Status: DC | PRN
  Administered 2024-09-06 – 2024-09-07 (×3): 1 mg via ORAL
  Filled 2024-09-05 (×3): qty 2

## 2024-09-05 MED ORDER — HYDROMORPHONE HCL 1 MG/ML IJ SOLN
1.0000 mg | Freq: Once | INTRAMUSCULAR | Status: AC
  Administered 2024-09-05: 1 mg via INTRAVENOUS
  Filled 2024-09-05 (×2): qty 1

## 2024-09-05 MED ORDER — LUBIPROSTONE 24 MCG PO CAPS
24.0000 ug | ORAL_CAPSULE | Freq: Two times a day (BID) | ORAL | Status: DC
  Administered 2024-09-05 – 2024-09-08 (×4): 24 ug via ORAL
  Filled 2024-09-05 (×8): qty 1

## 2024-09-05 MED ORDER — OSELTAMIVIR PHOSPHATE 75 MG PO CAPS
75.0000 mg | ORAL_CAPSULE | Freq: Two times a day (BID) | ORAL | Status: DC
  Administered 2024-09-05 – 2024-09-08 (×7): 75 mg via ORAL
  Filled 2024-09-05 (×7): qty 1

## 2024-09-05 MED ORDER — SODIUM CHLORIDE 0.9% FLUSH
3.0000 mL | Freq: Two times a day (BID) | INTRAVENOUS | Status: DC
  Administered 2024-09-05 – 2024-09-08 (×5): 3 mL via INTRAVENOUS

## 2024-09-05 MED ORDER — ENOXAPARIN SODIUM 40 MG/0.4ML IJ SOSY
40.0000 mg | PREFILLED_SYRINGE | INTRAMUSCULAR | Status: DC
  Administered 2024-09-06 – 2024-09-07 (×2): 40 mg via SUBCUTANEOUS
  Filled 2024-09-05 (×3): qty 0.4

## 2024-09-05 MED ORDER — METHADONE HCL 10 MG PO TABS
20.0000 mg | ORAL_TABLET | Freq: Four times a day (QID) | ORAL | Status: DC
  Administered 2024-09-05 – 2024-09-08 (×13): 20 mg via ORAL
  Filled 2024-09-05 (×13): qty 2

## 2024-09-05 MED ORDER — SODIUM CHLORIDE 0.9 % IV BOLUS
1000.0000 mL | Freq: Once | INTRAVENOUS | Status: AC
  Administered 2024-09-05: 1000 mL via INTRAVENOUS

## 2024-09-05 MED ORDER — LEVOTHYROXINE SODIUM 88 MCG PO TABS
88.0000 ug | ORAL_TABLET | Freq: Every day | ORAL | Status: DC
  Administered 2024-09-06 – 2024-09-08 (×3): 88 ug via ORAL
  Filled 2024-09-05 (×3): qty 1

## 2024-09-05 MED ORDER — PANTOPRAZOLE SODIUM 40 MG PO TBEC
40.0000 mg | DELAYED_RELEASE_TABLET | Freq: Every day | ORAL | Status: DC
  Administered 2024-09-05 – 2024-09-08 (×4): 40 mg via ORAL
  Filled 2024-09-05 (×4): qty 1

## 2024-09-05 MED ORDER — POLYETHYLENE GLYCOL 3350 17 G PO PACK: 17.0000 g | PACK | Freq: Every day | ORAL | Status: DC | PRN

## 2024-09-05 MED ORDER — MONTELUKAST SODIUM 10 MG PO TABS
10.0000 mg | ORAL_TABLET | Freq: Every day | ORAL | Status: DC
  Administered 2024-09-05 – 2024-09-08 (×4): 10 mg via ORAL
  Filled 2024-09-05 (×4): qty 1

## 2024-09-05 MED ORDER — KETOROLAC TROMETHAMINE 15 MG/ML IJ SOLN
15.0000 mg | Freq: Once | INTRAMUSCULAR | Status: AC
  Administered 2024-09-05: 15 mg via INTRAVENOUS
  Filled 2024-09-05 (×2): qty 1

## 2024-09-05 NOTE — ED Notes (Signed)
 Pt is all over the beds  Unable to be still   Medications given as ordered Family at beside

## 2024-09-05 NOTE — Assessment & Plan Note (Signed)
 Worsening CK levels now at 14, 866, history of prior rhabdomyolysis with excessive lower extremity muscle spasms. -Continue with IV fluid-rate was increased -Monitor CK

## 2024-09-05 NOTE — ED Provider Notes (Signed)
 "  [facility] Provider Note    Event Date/Time   First MD Initiated Contact with Patient 09/05/24 [phone]     (approximate)   History   Spasms   HPI  Justin [name] is a 43 y.o. male   Past medical history of spinal cord injury, paraplegia, muscle spasticity, frequent urinary tract infections due to self-catheterization, wheelchair dependent, here with several days of viral illness viral URI symptoms with known sick contact his wife, as well as vomiting was seen in the ED for the symptoms a couple days ago but symptoms have worsened, continues to have chest congestion and a cough as well as worsening bilateral leg cramping and muscle spasms.  He notes that his bilateral lower extremity spasms when he has an infection usually urine infections.  His muscle spasms have been so bad it has thrown him into the rhabdomyolysis in the past.   Independent Historian contributed to assessment above: Wife corroborates information as above       Physical Exam   Triage Vital Signs: ED Triage Vitals  Encounter Vitals Group     BP 09/04/24 2252 95/72     Girls Systolic BP Percentile --      Girls Diastolic BP Percentile --      Boys Systolic BP Percentile --      Boys Diastolic BP Percentile --      Pulse Rate 09/04/24 2252 88     Resp 09/04/24 2252 18     Temp 09/04/24 2252 98.3 F (36.8 C)     Temp Source 09/04/24 2252 Oral     SpO2 09/04/24 2252 94 %     Weight --      Height --      Head Circumference --      Peak Flow --      Pain Score 09/04/24 2305 7     Pain Loc --      Pain Education --      Exclude from Growth Chart --     Most recent vital signs: Vitals:   09/05/24 0142 09/05/24 0631  BP: 131/74 (!) 124/111  Pulse: 81 77  Resp: 19 20  Temp: 100 F (37.8 C) 98.9 F (37.2 C)  SpO2: 100% 95%    General: Awake, no distress.  CV:  Good peripheral perfusion.  Resp:  Normal effort.  Abd:  No distention.  Other:  Seems to be in  pain grabbing at both his thighs.  No obvious swelling or redness.  Soft benign abdominal exam.  Clear lungs to auscultation.  Neck supple full range of motion nontoxic-appearing overall.   ED Results / Procedures / Treatments   Labs (all labs ordered are listed, but only abnormal results are displayed) Labs Reviewed  RESP PANEL BY RT-PCR (RSV, FLU A&B, COVID)  RVPGX2 - Abnormal; Notable for the following components:      Result Value   Influenza A by PCR POSITIVE (*)    All other components within normal limits  CBC - Abnormal; Notable for the following components:   Platelets 132 (*)    All other components within normal limits  COMPREHENSIVE METABOLIC PANEL WITH GFR - Abnormal; Notable for the following components:   AST 45 (*)    Anion gap 16 (*)    All other components within normal limits  URINALYSIS, ROUTINE W REFLEX MICROSCOPIC - Abnormal; Notable for the following components:   Color, Urine YELLOW (*)    APPearance CLEAR (*)  Ketones, ur 5 (*)    All other components within normal limits  CK - Abnormal; Notable for the following components:   Total CK 918 (*)    All other components within normal limits  MAGNESIUM      I ordered and reviewed the above labs they are notable for positive for influenza A and rhabdo CK of 900s   RADIOLOGY I independently reviewed and interpreted chest x-ray and I see no obvious focality I also reviewed radiologist's formal read.   PROCEDURES:  Critical Care performed: No  Procedures   MEDICATIONS ORDERED IN ED: Medications  oseltamivir  (TAMIFLU ) capsule 75 mg (has no administration in time range)  baclofen  (LIORESAL ) tablet 40 mg (40 mg Oral Given 09/05/24 0451)  dantrolene  (DANTRIUM ) capsule 100 mg (100 mg Oral Given 09/05/24 0451)  sodium chloride  0.9 % bolus 1,000 mL (1,000 mLs Intravenous New Bag/Given 09/05/24 9386)  ketorolac  (TORADOL ) 15 MG/ML injection 15 mg (15 mg Intravenous Given 09/05/24 0612)  HYDROmorphone   (DILAUDID ) injection 1 mg (1 mg Intravenous Given 09/05/24 0612)    External physician / consultants:  I spoke with hospital medicine for admission and regarding care plan for this patient.   IMPRESSION / MDM / ASSESSMENT AND PLAN / ED COURSE  I reviewed the triage vital signs and the nursing notes.                                Patient's presentation is most consistent with acute presentation with potential threat to life or bodily function.  Differential diagnosis includes, but is not limited to, rhabdomyolysis, viral URI, pneumonia, sepsis, DVT, electrolyte derangements or dehydration   The patient is on the cardiac monitor to evaluate for evidence of arrhythmia and/or significant heart rate changes.  MDM:    Viral URI symptoms diagnosed today with influenza A known sick contact his wife.  This is what caused some mild dehydration, increased muscle spasms which will lead him into rhabdomyolysis.  Give IV fluids, pain control, Tamiflu  as he has tolerated this well in the past, and admission.  DVT scan negative.      FINAL CLINICAL IMPRESSION(S) / ED DIAGNOSES   Final diagnoses:  Muscle spasms of both lower extremities  Traumatic rhabdomyolysis, initial encounter  Dehydration  Influenza A     Rx / DC Orders   ED Discharge Orders     None        Note:  This document was prepared using Dragon voice recognition software and may include unintentional dictation errors.    Cyrena Mylar, MD 09/05/24 [phone]  "

## 2024-09-05 NOTE — ED Notes (Signed)
 Pt called out needing a brief and requesting pain medication from the nurse. Relayed the msg to the RN.

## 2024-09-05 NOTE — ED Notes (Signed)
 Pt requesting Ultrasound IV placement stating he is normally a difficult stick, not wanting this RN to attempt to look without ultrasound first. IV team consult placed at this time.

## 2024-09-05 NOTE — H&P (Signed)
 " History and Physical    Patient: Justin [name] FMW:[id number] DOB: 29-Apr-1981 DOA: 09/05/2024 DOS: the patient was seen and examined on 09/05/2024 PCP: Gasper Nancyann BRAVO, MD  Patient coming from: Home  Chief Complaint:  Chief Complaint  Patient presents with   Spasms   HPI: [name] is a 43 y.o. male with medical history significant of spinal cord injury, paraplegia, muscle spasticity, frequent urinary tract infections due to self-catheterization, wheelchair dependent, presented to ED with worsening lower extremity pain and spasms.  Patient was seen in ED few days ago for upper respiratory symptoms with positive sick contacts.  He was diagnosed with influenza A and was started on Tamiflu .  Now came with worsening lower extremity pain and spasms, he was concerned that he has developed rhabdomyolysis in the past with excessive lower extremity spasms.  Upper respiratory symptoms improving.  No fever or chills.  Unable to explain any urinary symptoms as patient is paraplegic.  ED course and data reviewed.  Vitals and labs stable, CK elevated at 918, UA was not consistent with UTI.  Lower extremity venous Doppler negative for DVT.  Chest x-ray was negative for any acute cardiopulmonary processes.  Patient was given IV fluid and was admitted for concern of excessive lower extremity spasms causing rhabdomyolysis.  Review of Systems: As mentioned in the history of present illness. All other systems reviewed and are negative. Past Medical History:  Diagnosis Date   Arthritis    Asthma    Chronic anxiety    Chronic depression    Chronic pain syndrome    secondary to fracture T9 and L3 with extensive spinal surgeery   Postoperative wound infection of right hip 2012   Rheumatic fever    Past Surgical History:  Procedure Laterality Date   BACK SURGERY  2000   Fracture T9 and L3 with paraplegia   Social History:  reports that he has quit smoking. He has been  exposed to tobacco smoke. He has never used smokeless tobacco. He reports current alcohol use. He reports that he does not use drugs.  Allergies[1]  Family History  Problem Relation Age of Onset   Other Mother    Hypertension Father    Gout Father    Prostate cancer Neg Hx    Kidney cancer Neg Hx     Prior to Admission medications  Medication Sig Start Date End Date Taking? Authorizing Provider  MOVANTIK 12.5 MG TABS tablet Take 25 mg by mouth daily. 08/25/24  Yes [provider]  omeprazole  (PRILOSEC) 40 MG capsule Take 40 mg by mouth daily. 09/01/24 09/01/25 Yes [provider]  albuterol  (VENTOLIN  HFA) 108 (90 Base) MCG/ACT inhaler Inhale 2 puffs into the lungs every 6 (six) hours as needed for wheezing or shortness of breath. 10/03/21   Gasper Nancyann BRAVO, MD  alprazolam  (XANAX ) 2 MG tablet Take 0.5 tablets (1 mg total) by mouth every 6 (six) hours as needed for sleep. 08/24/24   Gasper Nancyann BRAVO, MD  amphetamine -dextroamphetamine  (ADDERALL) 15 MG tablet Take 1 tablet by mouth 2 (two) times daily. 02/05/23   Gasper Nancyann BRAVO, MD  bacitracin  ointment Apply topically 2 (two) times daily. 01/07/23   Fausto Burnard LABOR, DO  baclofen  (LIORESAL ) 20 MG tablet TAKE ONE TABLET FOUR TIMES DAILY 06/25/24   Gasper Nancyann BRAVO, MD  cephALEXin  (KEFLEX ) 250 MG capsule Take 250 mg by mouth every morning.    [provider]  dantrolene  (DANTRIUM ) 100 MG capsule TAKE 1 CAPSULE BY  MOUTH 3 TIMES DAILY 07/27/24   Lovorn, Megan, MD  docusate sodium  (COLACE) 250 MG capsule Take 1 capsule (250 mg total) by mouth 4 (four) times daily as needed. 08/10/15   Gasper Nancyann BRAVO, MD  fesoterodine  (TOVIAZ ) 8 MG TB24 tablet TAKE 1 TABLET BY MOUTH DAILY 05/27/22   Gasper Nancyann BRAVO, MD  flavoxATE (URISPAS ) 100 MG tablet Take 1 tablet (100 mg total) by mouth 3 (three) times daily as needed for bladder spasms. 01/07/23   Fausto Burnard LABOR, DO  fluticasone  (FLONASE ) 50 MCG/ACT nasal spray Place 2 sprays into  both nostrils daily. 05/04/24   Gasper Nancyann BRAVO, MD  HYDROmorphone  (DILAUDID ) 4 MG tablet TAKE 1 TABLET BY MOUTH EVERY 4-5 HOURS AS NEEDED FOR PAIN. NO MORE THAN 5 TABLETS IN A DAY 08/26/24   Gasper Nancyann BRAVO, MD  leptospermum manuka honey (MEDIHONEY) PSTE paste Apply 1 Application topically daily. 01/08/23   Fausto Burnard LABOR, DO  levothyroxine  (SYNTHROID ) 88 MCG tablet TAKE 1 TABLET BY MOUTH DAILY 02/07/24   Gasper Nancyann BRAVO, MD  linaclotide  (LINZESS ) 145 MCG CAPS capsule Take 145 mcg by mouth daily before breakfast.    [provider]  lubiprostone  (AMITIZA ) 24 MCG capsule TAKE 1 CAPSULE BY MOUTH 2 TIMES DAILY WITH A MEAL. FOR OPIOID AND SPINAL CORD INJURY BASED CONSTIPATION. 03/06/24   Lovorn, Megan, MD  meclizine  (ANTIVERT ) 25 MG tablet Take by mouth.    [provider]  methadone  (DOLOPHINE ) 10 MG tablet Take 2 tablets (20 mg total) by mouth every 6 (six) hours. 08/26/24   Gasper Nancyann BRAVO, MD  Mineral Oil OIL daily.    [provider]  mirabegron ER (MYRBETRIQ) 25 MG TB24 tablet Take 50 mg by mouth daily. TAKE TWO TABLETS PO QD 01/09/24 07/07/24  [provider]  montelukast  (SINGULAIR ) 10 MG tablet Take 1 tablet (10 mg total) by mouth daily. 01/22/24 05/04/24  Gasper Nancyann BRAVO, MD  mupirocin  ointment (BACTROBAN ) 2 % Apply 1 Application topically 2 (two) times daily. 01/14/23   Gasper Nancyann BRAVO, MD  naproxen  sodium (ANAPROX ) 550 MG tablet TAKE 1 TABLET BY MOUTH 2 TIMES DAILY WITH A MEAL. 08/27/23   Gasper Nancyann BRAVO, MD  omeprazole  (PRILOSEC) 20 MG capsule TAKE 1 CAPSULE BY MOUTH ONCE DAILY 07/27/24   Gasper Nancyann BRAVO, MD  oxybutynin  (DITROPAN ) 5 MG tablet Take 5 mg by mouth daily as needed. 11/27/21   [provider]  pregabalin  (LYRICA ) 150 MG capsule Take one tablet two or three times a day as needed 01/21/23   Gasper Nancyann BRAVO, MD  promethazine  (PHENERGAN ) 25 MG suppository INSERT TWO (2) SUPPOSITORIES RECTALLY EVERY 6 HOURS AS NEEDED FOR NAUSEA OR VOMITING  09/02/24   Gasper Nancyann BRAVO, MD  promethazine  (PHENERGAN ) 25 MG tablet TAKE ONE TABLET EVERY FOUR HOURS IF NEEDED FOR NAUSEA OR VOMITING 08/26/24   Gasper Nancyann BRAVO, MD  pyridostigmine (MESTINON) 60 MG tablet Take 60 mg by mouth once.  Take 1 tablet (60 mg total) by mouth Three (3) times a day. Start with 1/2 tablet in the morning and if tolerated well, can increase up to 3 times a day 07/26/23 07/25/24  [provider]  sildenafil  (REVATIO ) 20 MG tablet TAKE 3 TO 5 TABLETS 2 HOURS BEFORE INTERCOURSE ON EMPTY STOMACH. DO NOT TAKE WITH NITRATES. 01/11/24   Gasper Nancyann BRAVO, MD  silver  sulfADIAZINE  (SSD) 1 % cream APPLY 2 GRAMS TO AFFECTED AREA TWICE DAILY APPLY TO AFFECTED AREAS TOPICALLY TWICE DAILY 02/13/24  Gasper Nancyann BRAVO, MD  sodium fluoride (DENTAGEL) 1.1 % GEL dental gel as needed. 07/31/22   [provider]  tacrolimus  (PROTOPIC ) 0.1 % ointment After dressing change apply topically daily to rash at buttock and cover with bandage 04/23/23   Jackquline Sawyer, MD  tiZANidine  (ZANAFLEX ) 4 MG tablet TAKE 2 TABLETS BY MOUTH 4 TIMES DAILY 07/27/24   Gasper Nancyann BRAVO, MD  triamcinolone  ointment (KENALOG ) 0.5 % Apply twice a day to affected area on left hand 02/22/23   Gasper Nancyann BRAVO, MD    Physical Exam: Vitals:   09/05/24 0142 09/05/24 0631 09/05/24 0826 09/05/24 1049  BP: 131/74 (!) 124/111  (!) 120/99  Pulse: 81 77  80  Resp: 19 20  20   Temp: 100 F (37.8 C) 98.9 F (37.2 C)  99 F (37.2 C)  TempSrc: Oral Oral  Oral  SpO2: 100% 95%  95%  Weight:   77.1 kg   Height:   6' 4 (1.93 m)    Vitals:   09/05/24 0142 09/05/24 0631 09/05/24 0826 09/05/24 1049  BP: 131/74 (!) 124/111  (!) 120/99  Pulse: 81 77  80  Resp: 19 20  20   Temp: 100 F (37.8 C) 98.9 F (37.2 C)  99 F (37.2 C)  TempSrc: Oral Oral  Oral  SpO2: 100% 95%  95%  Weight:   77.1 kg   Height:   6' 4 (1.93 m)    General: Vital signs reviewed.  Patient is well-developed and well-nourished, appears anxious and  in distress due to excessive lower extremity spasms and pain. Head: Normocephalic and atraumatic. Eyes: EOMI, conjunctivae normal, no scleral icterus.  Neck: Supple, trachea midline, normal ROM, Cardiovascular: RRR, S1 normal, S2 normal, no murmurs, gallops, or rubs. Pulmonary/Chest: Clear to auscultation bilaterally, no wheezes, rales, or rhonchi. Abdominal: Soft, non-tender, non-distended, BS +,  Extremities: No lower extremity edema bilaterally,  pulses symmetric and intact bilaterally. No cyanosis or clubbing. Neurological: A&O x3, Strength is normal and symmetric bilaterally, cranial nerve II-XII are grossly intact, paraplegic. Psychiatric: Normal mood and affect.    Data Reviewed: Prior data reviewed as mentioned above  Assessment and Plan: * Influenzal acute upper respiratory infection With positive sick contacts.  Respiratory panel positive for influenza A, chest x-ray with no infiltrate. - Admit to MedSurg -Started on Tamiflu -continue -Supportive care  Chronic pain syndrome Patient continued to have significant lower extremity pain and spasms.  History of chronic pain syndrome since his spinal cord injury.  On multiple pain medications at home.  Pain more concerning for neuropathic pain with the shooting character. Magnesium  normal at 1.9, potassium normal.  Acute exacerbation likely secondary to influenza. -Continuing home Lyrica , dantrolene , tizanidine , methadone , Dilaudid  and baclofen .  Rhabdomyolysis CK elevated at 918, history of prior rhabdomyolysis with excessive lower extremity muscle spasms. -Giving some IV fluid -Monitor CK  Chronic anxiety - Continuing home Xanax  as needed  Advance Care Planning:   Code Status: Full Code   Consults: None  Family Communication: Discussed with wife at bedside  Severity of Illness: The appropriate patient status for this patient is OBSERVATION. Observation status is judged to be reasonable and necessary in order to provide  the required intensity of service to ensure the patient's safety. The patient's presenting symptoms, physical exam findings, and initial radiographic and laboratory data in the context of their medical condition is felt to place them at decreased risk for further clinical deterioration. Furthermore, it is anticipated that the patient will be medically stable for  discharge from the hospital within 2 midnights of admission.   This record has been created using Conservation officer, historic buildings. Errors have been sought and corrected,but may not always be located. Such creation errors do not reflect on the standard of care.   Author: Amaryllis Dare, MD 09/05/2024 2:50 PM  For on call review [email or web address].     [1]  Allergies Allergen Reactions   Azithromycin Anaphylaxis, Itching and Swelling    throat closes   Lorazepam Anaphylaxis and Itching    extremely hostile  Other Reaction(s): Other  extremely hostile    Severe Aggression   Meperidine Anaphylaxis    Other Reaction(s): Unknown   Morphine  Anaphylaxis    psychosis  Other Reaction(s): Hallucinations  psychosis    Psychotic symptoms   Doxycycline      Blister on hand   Morphine  Sulfate     Psychotic symptoms   "

## 2024-09-05 NOTE — Assessment & Plan Note (Signed)
-   Continuing home Xanax  as needed

## 2024-09-05 NOTE — Assessment & Plan Note (Signed)
 Patient continued to have significant lower extremity pain and spasms.  History of chronic pain syndrome since his spinal cord injury.  On multiple pain medications at home.  Pain more concerning for neuropathic pain with the shooting character. Magnesium  normal at 1.9, potassium normal.  Acute exacerbation likely secondary to influenza. -Continuing home Lyrica , dantrolene , tizanidine , methadone , Dilaudid  and baclofen . - Increasing the dose of baclofen  to 40 mg 3 times daily as continue to have significant spasms with worsening CK

## 2024-09-05 NOTE — ED Notes (Signed)
 IV team at bedside

## 2024-09-05 NOTE — ED Notes (Signed)
 Lab called for blood work.

## 2024-09-05 NOTE — ED Notes (Signed)
 Report given to [facility], RN, who will be assuming care of pt.

## 2024-09-05 NOTE — ED Notes (Signed)
 Ultrasound at bedside

## 2024-09-05 NOTE — Assessment & Plan Note (Addendum)
 With positive sick contacts.  Respiratory panel positive for influenza A, chest x-ray with no infiltrate. - Admit to MedSurg -Started on Tamiflu -continue -Supportive care - Added Mucinex  DM and DuoNeb nebulizer -Incentive spirometry

## 2024-09-06 ENCOUNTER — Inpatient Hospital Stay

## 2024-09-06 DIAGNOSIS — Z993 Dependence on wheelchair: Secondary | ICD-10-CM | POA: Diagnosis not present

## 2024-09-06 DIAGNOSIS — Z87891 Personal history of nicotine dependence: Secondary | ICD-10-CM | POA: Diagnosis not present

## 2024-09-06 DIAGNOSIS — R0902 Hypoxemia: Secondary | ICD-10-CM | POA: Diagnosis present

## 2024-09-06 DIAGNOSIS — G8220 Paraplegia, unspecified: Secondary | ICD-10-CM | POA: Diagnosis present

## 2024-09-06 DIAGNOSIS — F419 Anxiety disorder, unspecified: Secondary | ICD-10-CM | POA: Diagnosis not present

## 2024-09-06 DIAGNOSIS — R748 Abnormal levels of other serum enzymes: Secondary | ICD-10-CM | POA: Diagnosis present

## 2024-09-06 DIAGNOSIS — F32A Depression, unspecified: Secondary | ICD-10-CM | POA: Diagnosis not present

## 2024-09-06 DIAGNOSIS — Z87440 Personal history of urinary (tract) infections: Secondary | ICD-10-CM | POA: Diagnosis not present

## 2024-09-06 DIAGNOSIS — J09X1 Influenza due to identified novel influenza A virus with pneumonia: Secondary | ICD-10-CM | POA: Diagnosis present

## 2024-09-06 DIAGNOSIS — M62838 Other muscle spasm: Secondary | ICD-10-CM | POA: Diagnosis not present

## 2024-09-06 DIAGNOSIS — J45909 Unspecified asthma, uncomplicated: Secondary | ICD-10-CM | POA: Diagnosis present

## 2024-09-06 DIAGNOSIS — Z8249 Family history of ischemic heart disease and other diseases of the circulatory system: Secondary | ICD-10-CM | POA: Diagnosis not present

## 2024-09-06 DIAGNOSIS — Z8701 Personal history of pneumonia (recurrent): Secondary | ICD-10-CM | POA: Diagnosis not present

## 2024-09-06 DIAGNOSIS — Z79899 Other long term (current) drug therapy: Secondary | ICD-10-CM | POA: Diagnosis not present

## 2024-09-06 DIAGNOSIS — E860 Dehydration: Secondary | ICD-10-CM | POA: Diagnosis not present

## 2024-09-06 DIAGNOSIS — J111 Influenza due to unidentified influenza virus with other respiratory manifestations: Secondary | ICD-10-CM | POA: Diagnosis not present

## 2024-09-06 DIAGNOSIS — X58XXXA Exposure to other specified factors, initial encounter: Secondary | ICD-10-CM | POA: Diagnosis present

## 2024-09-06 DIAGNOSIS — J1000 Influenza due to other identified influenza virus with unspecified type of pneumonia: Secondary | ICD-10-CM | POA: Diagnosis present

## 2024-09-06 DIAGNOSIS — T796XXA Traumatic ischemia of muscle, initial encounter: Secondary | ICD-10-CM | POA: Diagnosis not present

## 2024-09-06 DIAGNOSIS — Z79890 Hormone replacement therapy (postmenopausal): Secondary | ICD-10-CM | POA: Diagnosis not present

## 2024-09-06 DIAGNOSIS — G894 Chronic pain syndrome: Secondary | ICD-10-CM | POA: Diagnosis not present

## 2024-09-06 DIAGNOSIS — E876 Hypokalemia: Secondary | ICD-10-CM | POA: Diagnosis present

## 2024-09-06 LAB — CBC
HCT: 42.9 % (ref 39.0–52.0)
Hemoglobin: 14.1 g/dL (ref 13.0–17.0)
MCH: 28.4 pg (ref 26.0–34.0)
MCHC: 32.9 g/dL (ref 30.0–36.0)
MCV: 86.5 fL (ref 80.0–100.0)
Platelets: 110 K/uL — ABNORMAL LOW (ref 150–400)
RBC: 4.96 MIL/uL (ref 4.22–5.81)
RDW: 13.2 % (ref 11.5–15.5)
WBC: 4.3 K/uL (ref 4.0–10.5)
nRBC: 0 % (ref 0.0–0.2)

## 2024-09-06 LAB — BASIC METABOLIC PANEL WITH GFR
Anion gap: 16 — ABNORMAL HIGH (ref 5–15)
BUN: 17 mg/dL (ref 6–20)
CO2: 23 mmol/L (ref 22–32)
Calcium: 8.7 mg/dL — ABNORMAL LOW (ref 8.9–10.3)
Chloride: 98 mmol/L (ref 98–111)
Creatinine, Ser: 0.75 mg/dL (ref 0.61–1.24)
GFR, Estimated: >60 mL/min
Glucose, Bld: 59 mg/dL — ABNORMAL LOW (ref 70–99)
Potassium: 4.1 mmol/L (ref 3.5–5.1)
Sodium: 138 mmol/L (ref 135–145)

## 2024-09-06 LAB — CBG MONITORING, ED
Glucose-Capillary: 100 mg/dL — ABNORMAL HIGH (ref 70–99)
Glucose-Capillary: 59 mg/dL — ABNORMAL LOW (ref 70–99)
Glucose-Capillary: 98 mg/dL (ref 70–99)

## 2024-09-06 LAB — GLUCOSE, CAPILLARY: Glucose-Capillary: 99 mg/dL (ref 70–99)

## 2024-09-06 LAB — HIV ANTIBODY (ROUTINE TESTING W REFLEX): HIV Screen 4th Generation wRfx: NONREACTIVE

## 2024-09-06 LAB — CK: Total CK: 14836 U/L — ABNORMAL HIGH (ref 49–397)

## 2024-09-06 MED ORDER — IPRATROPIUM-ALBUTEROL 0.5-2.5 (3) MG/3ML IN SOLN
3.0000 mL | Freq: Three times a day (TID) | RESPIRATORY_TRACT | Status: DC
  Administered 2024-09-07 (×2): 3 mL via RESPIRATORY_TRACT
  Filled 2024-09-06 (×3): qty 3

## 2024-09-06 MED ORDER — DM-GUAIFENESIN ER 30-600 MG PO TB12
1.0000 | ORAL_TABLET | Freq: Two times a day (BID) | ORAL | Status: DC
  Administered 2024-09-06 – 2024-09-08 (×5): 1 via ORAL
  Filled 2024-09-06 (×5): qty 1

## 2024-09-06 MED ORDER — SODIUM CHLORIDE 0.9 % IV SOLN: 12.5000 mg | Freq: Four times a day (QID) | INTRAVENOUS | Status: DC | PRN

## 2024-09-06 MED ORDER — IPRATROPIUM-ALBUTEROL 0.5-2.5 (3) MG/3ML IN SOLN
3.0000 mL | Freq: Four times a day (QID) | RESPIRATORY_TRACT | Status: DC
  Administered 2024-09-06 (×2): 3 mL via RESPIRATORY_TRACT
  Filled 2024-09-06 (×2): qty 3

## 2024-09-06 MED ORDER — BACLOFEN 10 MG PO TABS
40.0000 mg | ORAL_TABLET | Freq: Three times a day (TID) | ORAL | Status: DC
  Administered 2024-09-06 – 2024-09-08 (×6): 40 mg via ORAL
  Filled 2024-09-06 (×6): qty 4

## 2024-09-06 MED ORDER — LACTATED RINGERS IV SOLN: INTRAVENOUS | Status: AC

## 2024-09-06 NOTE — Progress Notes (Signed)
 Myself and another respiratory therapist responded to rapid response. Patient with no respirations noted on NRB (report from RN that previously saturation of 60% on room air). Code blue called, bag mask done by fellow therapist less than 1 minute before patient became responsive. Bag mask stopped, MD arrived at bedside, vitals taken, no further intervention from respiratory at this time.

## 2024-09-06 NOTE — Progress Notes (Signed)
 Rapid Response Event Note   Reason for Call :  Difficult arousing patient; desat 60% on RA   Initial Focused Assessment: RT and multiple RN at bedside upon arrival. No response to sternal rub. Pt not breathing on NRB that was applied d/t desaturation episode by primary RN. Code blue called w/ plan to intubate. Bag mask started by RT, pads applied, and back board placed. Patient became responsive within a minute, was able to sit up and start talking to nursing staff.  MD at bedside. Pt A&O. Vital signs stable and improved saturation noted to be 98% on RA. CBG 99. See vital signs in EPIC.       Interventions: Applied telemetry and continuous pulse ox. CXR ordered. No additional interventions needed at this time.    Plan of Care: Continue current plan of care.     Event Summary: See initial focused assessment. F/U w/ primary nurse; no additional ICU needs at this time, though primary nurse reports concern that patient is intermittently drowsy and receiving multiple medications that can increase drowsiness.  MD Notified: Dr. Dorinda Call Time: 1856 Arrival Time: 1858 End Time: 1901  Clotilda CHRISTELLA Hudson, RN

## 2024-09-06 NOTE — Hospital Course (Addendum)
 Justin [name] is a 43 y.o. male with medical history significant of spinal cord injury, paraplegia, muscle spasticity, frequent urinary tract infections due to self-catheterization, wheelchair dependent, presented to ED with worsening lower extremity pain and spasms.   Patient was seen in ED few days ago for upper respiratory symptoms with positive sick contacts.  He was diagnosed with influenza A and was started on Tamiflu .   Now came with worsening lower extremity pain and spasms, he was concerned that he has developed rhabdomyolysis in the past with excessive lower extremity spasms.  Upper respiratory symptoms improving.  No fever or chills.  On presentation basic lab and vital stable, CK elevated at 918, UA was not consistent with UTI.  Lower extremity venous Doppler negative for DVT.  Chest x-ray was negative for any acute cardiopulmonary processes.   Patient was given IV fluid and was admitted for concern of excessive lower extremity spasms causing rhabdomyolysis.  12/21: Vital stable, CK increased to 14,836.  Platelets at 110.  Increasing LR rate to 150 mL/h. Continue to have significant lower extremity muscle spasms-increasing baclofen  dose to 40 mg 3 times daily.  12/22: CODE BLUE was called yesterday evening.  Patient seems to be not breathing, patient recovered quickly without any CPR, went back on room air and talking, maybe some element of sleep apnea as he was sleeping.  Labs and chest x-ray stable.  CK started improving now at 6335, mild hypophosphatemia at 2.3.  Replating phosphorous and continuing IV fluid for another day. Started on Levaquin  due to worsening cough and congestion, patient is very concerned about developing pneumonia due to his prior history.  12/23: Remained hemodynamically stable, labs with mild hypokalemia at 3.3 and CK continue to improve now at 2437, renal functions normal.  Potassium and magnesium  was repleted before discharge.  Patient shows  mild hypoxia and signs of intermittent apnea during sleep, he will get benefit from a outpatient sleep study for further evaluation.  Patient was given 3 more days of Levaquin  to complete a 5-day course for concern of developing pneumonia with influenza A.  Antibiotic choice was based on his allergies.  He can continue using Mucinex  DM as needed.  Patient is on multiple pain medications and muscle relaxants at home which he will continue and advised to follow-up in a specialized pain clinic for spinal cord injuries and spasticity and will get benefit from other modalities instead of more opioids.  He was also advised to keep himself well-hydrated as CK is still elevated than baseline but much improved than before.  His muscle spasm seems to be at baseline now.  He will continue with his home medications and follow-up with his providers closely for further management.

## 2024-09-06 NOTE — ED Notes (Addendum)
 Patient refuses to wear telemetry device.

## 2024-09-06 NOTE — Progress Notes (Signed)
 " Progress Note   Patient: Justin [name] FMW:[id number] DOB: 1980/11/20 DOA: 09/05/2024     0 DOS: the patient was seen and examined on 09/06/2024   Brief hospital course: Justin [name] is a 43 y.o. male with medical history significant of spinal cord injury, paraplegia, muscle spasticity, frequent urinary tract infections due to self-catheterization, wheelchair dependent, presented to ED with worsening lower extremity pain and spasms.   Patient was seen in ED few days ago for upper respiratory symptoms with positive sick contacts.  He was diagnosed with influenza A and was started on Tamiflu .   Now came with worsening lower extremity pain and spasms, he was concerned that he has developed rhabdomyolysis in the past with excessive lower extremity spasms.  Upper respiratory symptoms improving.  No fever or chills.  On presentation basic lab and vital stable, CK elevated at 918, UA was not consistent with UTI.  Lower extremity venous Doppler negative for DVT.  Chest x-ray was negative for any acute cardiopulmonary processes.   Patient was given IV fluid and was admitted for concern of excessive lower extremity spasms causing rhabdomyolysis.  12/21: Vital stable, CK increased to 14,836.  Platelets at 110.  Increasing LR rate to 150 mL/h. Continue to have significant lower extremity muscle spasms-increasing baclofen  dose to 40 mg 3 times daily.  Assessment and Plan: * Influenzal acute upper respiratory infection With positive sick contacts.  Respiratory panel positive for influenza A, chest x-ray with no infiltrate. - Admit to MedSurg -Started on Tamiflu -continue -Supportive care - Added Mucinex  DM and DuoNeb nebulizer -Incentive spirometry  Chronic pain syndrome Patient continued to have significant lower extremity pain and spasms.  History of chronic pain syndrome since his spinal cord injury.  On multiple pain medications at home.  Pain more concerning for  neuropathic pain with the shooting character. Magnesium  normal at 1.9, potassium normal.  Acute exacerbation likely secondary to influenza. -Continuing home Lyrica , dantrolene , tizanidine , methadone , Dilaudid  and baclofen . - Increasing the dose of baclofen  to 40 mg 3 times daily as continue to have significant spasms with worsening CK  Rhabdomyolysis Worsening CK levels now at 14, 866, history of prior rhabdomyolysis with excessive lower extremity muscle spasms. -Continue with IV fluid-rate was increased -Monitor CK  Chronic anxiety - Continuing home Xanax  as needed   Subjective: Patient continued to have significant lower extremity spasms despite getting home medications with increased dose of Dilaudid .  Having some nausea stating that Zofran  is not working.  Worsening cough and congestion.  Physical Exam: Vitals:   09/05/24 2312 09/06/24 0301 09/06/24 0718 09/06/24 1121  BP: 95/60 127/65 134/65 (!) 143/80  Pulse: 60 95 (!) 104 (!) 109  Resp: 16 20 18 15   Temp: (!) 97.5 F (36.4 C) 98.3 F (36.8 C) 99 F (37.2 C) 99.5 F (37.5 C)  TempSrc: Axillary Oral Oral Oral  SpO2: 100% 95% 94% 95%  Weight:      Height:       General.  Well-developed paraplegic gentleman, in no acute distress. Pulmonary.  Lungs clear bilaterally, normal respiratory effort. CV.  Regular rate and rhythm, no JVD, rub or murmur. Abdomen.  Soft, nontender, nondistended, BS positive. CNS.  Alert and oriented .  Paraplegic Extremities.  No edema,  pulses intact and symmetrical. Psychiatry.  Judgment and insight appears normal.   Data Reviewed: Prior data reviewed  Family Communication: Discussed with patient  Disposition: Status is: Observation The patient will require care spanning > 2 midnights and should be moved  to inpatient because: Severity of illness, worsening CK requiring more IV fluid.  Planned Discharge Destination: Home  DVT prophylaxis.  Lovenox  Time spent: 50 minutes  This record has  been created using Conservation officer, historic buildings. Errors have been sought and corrected,but may not always be located. Such creation errors do not reflect on the standard of care.   Author: Amaryllis Dare, MD 09/06/2024 2:16 PM  For on call review [email or web address].  "

## 2024-09-06 NOTE — ED Notes (Signed)
 Provided pt with body wash, towels, basin with water, and wash cloths for pt to wash his face and hair per pt request.

## 2024-09-06 NOTE — ED Provider Notes (Signed)
 Responded to a Code Blue request. Upon arrival, patient alert, talking. Code blue was cancelled. Hospitalist at bedside.    Justin Rolland BRAVO, MD 09/06/24 [phone]

## 2024-09-07 DIAGNOSIS — T796XXA Traumatic ischemia of muscle, initial encounter: Secondary | ICD-10-CM | POA: Diagnosis not present

## 2024-09-07 DIAGNOSIS — E860 Dehydration: Secondary | ICD-10-CM | POA: Diagnosis not present

## 2024-09-07 DIAGNOSIS — J111 Influenza due to unidentified influenza virus with other respiratory manifestations: Secondary | ICD-10-CM | POA: Diagnosis not present

## 2024-09-07 DIAGNOSIS — M62838 Other muscle spasm: Secondary | ICD-10-CM | POA: Diagnosis not present

## 2024-09-07 LAB — CBC
HCT: 36.8 % — ABNORMAL LOW (ref 39.0–52.0)
Hemoglobin: 12.2 g/dL — ABNORMAL LOW (ref 13.0–17.0)
MCH: 28.4 pg (ref 26.0–34.0)
MCHC: 33.2 g/dL (ref 30.0–36.0)
MCV: 85.8 fL (ref 80.0–100.0)
Platelets: 87 K/uL — ABNORMAL LOW (ref 150–400)
RBC: 4.29 MIL/uL (ref 4.22–5.81)
RDW: 13.3 % (ref 11.5–15.5)
WBC: 2.4 K/uL — ABNORMAL LOW (ref 4.0–10.5)
nRBC: 0 % (ref 0.0–0.2)

## 2024-09-07 LAB — RENAL FUNCTION PANEL
Albumin: 3.8 g/dL (ref 3.5–5.0)
Anion gap: 13 (ref 5–15)
BUN: 11 mg/dL (ref 6–20)
CO2: 27 mmol/L (ref 22–32)
Calcium: 8.3 mg/dL — ABNORMAL LOW (ref 8.9–10.3)
Chloride: 97 mmol/L — ABNORMAL LOW (ref 98–111)
Creatinine, Ser: 0.67 mg/dL (ref 0.61–1.24)
GFR, Estimated: >60 mL/min
Glucose, Bld: 60 mg/dL — ABNORMAL LOW (ref 70–99)
Phosphorus: 2.3 mg/dL — ABNORMAL LOW (ref 2.5–4.6)
Potassium: 4.1 mmol/L (ref 3.5–5.1)
Sodium: 137 mmol/L (ref 135–145)

## 2024-09-07 LAB — CK: Total CK: 6335 U/L — ABNORMAL HIGH (ref 49–397)

## 2024-09-07 MED ORDER — LACTATED RINGERS IV SOLN: INTRAVENOUS | Status: AC

## 2024-09-07 MED ORDER — LEVOFLOXACIN 500 MG PO TABS
500.0000 mg | ORAL_TABLET | Freq: Every day | ORAL | Status: DC
  Administered 2024-09-07 – 2024-09-08 (×2): 500 mg via ORAL
  Filled 2024-09-07 (×2): qty 1

## 2024-09-07 MED ORDER — NAPROXEN 500 MG PO TABS
500.0000 mg | ORAL_TABLET | Freq: Two times a day (BID) | ORAL | Status: DC
  Administered 2024-09-07 – 2024-09-08 (×2): 500 mg via ORAL
  Filled 2024-09-07 (×3): qty 1

## 2024-09-07 MED ORDER — SODIUM PHOSPHATES 45 MMOLE/15ML IV SOLN
30.0000 mmol | Freq: Once | INTRAVENOUS | Status: AC
  Administered 2024-09-07: 30 mmol via INTRAVENOUS
  Filled 2024-09-07: qty 10

## 2024-09-07 NOTE — Progress Notes (Signed)
 " Progress Note   Patient: Justin [name] FMW:[id number] DOB: 1981/07/26 DOA: 09/05/2024     1 DOS: the patient was seen and examined on 09/07/2024   Brief hospital course: Justin [name] is a 43 y.o. male with medical history significant of spinal cord injury, paraplegia, muscle spasticity, frequent urinary tract infections due to self-catheterization, wheelchair dependent, presented to ED with worsening lower extremity pain and spasms.   Patient was seen in ED few days ago for upper respiratory symptoms with positive sick contacts.  He was diagnosed with influenza A and was started on Tamiflu .   Now came with worsening lower extremity pain and spasms, he was concerned that he has developed rhabdomyolysis in the past with excessive lower extremity spasms.  Upper respiratory symptoms improving.  No fever or chills.  On presentation basic lab and vital stable, CK elevated at 918, UA was not consistent with UTI.  Lower extremity venous Doppler negative for DVT.  Chest x-ray was negative for any acute cardiopulmonary processes.   Patient was given IV fluid and was admitted for concern of excessive lower extremity spasms causing rhabdomyolysis.  12/21: Vital stable, CK increased to 14,836.  Platelets at 110.  Increasing LR rate to 150 mL/h. Continue to have significant lower extremity muscle spasms-increasing baclofen  dose to 40 mg 3 times daily.  12/22: CODE BLUE was called yesterday evening.  Patient seems to be not breathing, patient recovered quickly without any CPR, went back on room air and talking, maybe some element of sleep apnea as he was sleeping.  Labs and chest x-ray stable.  CK started improving now at 6335, mild hypophosphatemia at 2.3.  Replating phosphorous and continuing IV fluid for another day. Started on Levaquin  due to worsening cough and congestion, patient is very concerned about developing pneumonia due to his prior history.  Assessment and Plan: *  Influenzal acute upper respiratory infection With positive sick contacts.  Respiratory panel positive for influenza A, chest x-ray with no infiltrate. -Started on Tamiflu -continue -Started on Levaquin  due to worsening cough and congestion and his history of recurrent pneumonia. -Supportive care - Added Mucinex  DM and DuoNeb nebulizer -Incentive spirometry  Chronic pain syndrome Patient continued to have significant lower extremity pain and spasms.  History of chronic pain syndrome since his spinal cord injury.  On multiple pain medications at home.  Pain more concerning for neuropathic pain with the shooting character. Magnesium  normal at 1.9, potassium normal.  Acute exacerbation likely secondary to influenza. -Continuing home Lyrica , dantrolene , tizanidine , methadone , Dilaudid  and baclofen . - Increasing the dose of baclofen  to 40 mg 3 times daily as continue to have significant spasms. - Will try adding naproxen  500 mg twice daily  Rhabdomyolysis  CK levels now at [phone], history of prior rhabdomyolysis with excessive lower extremity muscle spasms. -Continue with IV fluid for another day as CK started improving now. -Monitor CK  Chronic anxiety - Continuing home Xanax  as needed   Subjective: Patient continued to have lower extremity pain, seems chronic as he was struggling with lower extremity spasms and pain for the past many year after his spinal cord injury.  Per patient intensity of pain is improving and getting closer to baseline.  Physical Exam: Vitals:   09/07/24 0001 09/07/24 0404 09/07/24 0755 09/07/24 0909  BP: 116/67 (!) 119/106  (!) 147/95  Pulse: (!) 105 85  (!) 104  Resp:    18  Temp:  99.1 F (37.3 C)  98.7 F (37.1 C)  TempSrc:  Oral  SpO2: 94% 92% 94% 93%  Weight:      Height:       General.  Well-developed, paraplegic gentleman, in no acute distress. Pulmonary.  Lungs clear bilaterally, normal respiratory effort. CV.  Regular rate and rhythm, no  JVD, rub or murmur. Abdomen.  Soft, nontender, nondistended, BS positive. CNS.  Alert and oriented .  Paraplegic Extremities.  No edema,  pulses intact and symmetrical. Psychiatry.  Judgment and insight appears normal.   Data Reviewed: Prior data reviewed  Family Communication: Discussed with patient and wife on phone.  Disposition: Status is: Inpatient inpatient because: Severity of illness, worsening CK requiring more IV fluid.  Planned Discharge Destination: Home  DVT prophylaxis.  Lovenox  Time spent: 50 minutes  This record has been created using Conservation officer, historic buildings. Errors have been sought and corrected,but may not always be located. Such creation errors do not reflect on the standard of care.   Author: Amaryllis Dare, MD 09/07/2024 1:16 PM  For on call review [email or web address].  "

## 2024-09-07 NOTE — Assessment & Plan Note (Signed)
"   CK levels now at [phone], history of prior rhabdomyolysis with excessive lower extremity muscle spasms. -Continue with IV fluid for another day as CK started improving now. -Monitor CK "

## 2024-09-07 NOTE — Assessment & Plan Note (Signed)
 Patient continued to have significant lower extremity pain and spasms.  History of chronic pain syndrome since his spinal cord injury.  On multiple pain medications at home.  Pain more concerning for neuropathic pain with the shooting character. Magnesium  normal at 1.9, potassium normal.  Acute exacerbation likely secondary to influenza. -Continuing home Lyrica , dantrolene , tizanidine , methadone , Dilaudid  and baclofen . - Increasing the dose of baclofen  to 40 mg 3 times daily as continue to have significant spasms. - Will try adding naproxen  500 mg twice daily

## 2024-09-07 NOTE — Assessment & Plan Note (Signed)
 With positive sick contacts.  Respiratory panel positive for influenza A, chest x-ray with no infiltrate. -Started on Tamiflu -continue -Started on Levaquin  due to worsening cough and congestion and his history of recurrent pneumonia. -Supportive care - Added Mucinex  DM and DuoNeb nebulizer -Incentive spirometry

## 2024-09-08 ENCOUNTER — Other Ambulatory Visit (HOSPITAL_COMMUNITY): Payer: Self-pay

## 2024-09-08 ENCOUNTER — Other Ambulatory Visit: Payer: Self-pay

## 2024-09-08 DIAGNOSIS — J069 Acute upper respiratory infection, unspecified: Secondary | ICD-10-CM

## 2024-09-08 LAB — CK: Total CK: 2437 U/L — ABNORMAL HIGH (ref 49–397)

## 2024-09-08 LAB — RENAL FUNCTION PANEL
Albumin: 3.9 g/dL (ref 3.5–5.0)
Anion gap: 12 (ref 5–15)
BUN: 5 mg/dL — ABNORMAL LOW (ref 6–20)
CO2: 30 mmol/L (ref 22–32)
Calcium: 8.4 mg/dL — ABNORMAL LOW (ref 8.9–10.3)
Chloride: 100 mmol/L (ref 98–111)
Creatinine, Ser: 0.54 mg/dL — ABNORMAL LOW (ref 0.61–1.24)
GFR, Estimated: >60 mL/min
Glucose, Bld: 83 mg/dL (ref 70–99)
Phosphorus: 3.1 mg/dL (ref 2.5–4.6)
Potassium: 3.3 mmol/L — ABNORMAL LOW (ref 3.5–5.1)
Sodium: 141 mmol/L (ref 135–145)

## 2024-09-08 MED ORDER — ALBUTEROL SULFATE HFA 108 (90 BASE) MCG/ACT IN AERS
2.0000 | INHALATION_SPRAY | Freq: Four times a day (QID) | RESPIRATORY_TRACT | 1 refills | Status: AC | PRN
  Filled 2024-09-08: qty 18, 30d supply, fill #0

## 2024-09-08 MED ORDER — DM-GUAIFENESIN ER 30-600 MG PO TB12
1.0000 | ORAL_TABLET | Freq: Two times a day (BID) | ORAL | 0 refills | Status: AC | PRN
  Filled 2024-09-08: qty 20, 10d supply, fill #0

## 2024-09-08 MED ORDER — PREGABALIN 150 MG PO CAPS
150.0000 mg | ORAL_CAPSULE | ORAL | 1 refills | Status: AC | PRN
  Filled 2024-09-08: qty 60, 20d supply, fill #0

## 2024-09-08 MED ORDER — POTASSIUM CHLORIDE CRYS ER 20 MEQ PO TBCR
40.0000 meq | EXTENDED_RELEASE_TABLET | Freq: Once | ORAL | Status: AC
  Administered 2024-09-08: 40 meq via ORAL
  Filled 2024-09-08: qty 2

## 2024-09-08 MED ORDER — MAGNESIUM SULFATE 2 GM/50ML IV SOLN
2.0000 g | Freq: Once | INTRAVENOUS | Status: AC
  Administered 2024-09-08: 2 g via INTRAVENOUS
  Filled 2024-09-08: qty 50

## 2024-09-08 MED ORDER — LEVOFLOXACIN 500 MG PO TABS
500.0000 mg | ORAL_TABLET | Freq: Every day | ORAL | 0 refills | Status: AC
  Filled 2024-09-08: qty 3, 3d supply, fill #0

## 2024-09-08 MED ORDER — IPRATROPIUM-ALBUTEROL 0.5-2.5 (3) MG/3ML IN SOLN
3.0000 mL | Freq: Two times a day (BID) | RESPIRATORY_TRACT | Status: DC
  Administered 2024-09-08: 3 mL via RESPIRATORY_TRACT
  Filled 2024-09-08: qty 3

## 2024-09-08 MED ORDER — NAPROXEN 500 MG PO TABS
500.0000 mg | ORAL_TABLET | Freq: Two times a day (BID) | ORAL | 0 refills | Status: AC
  Filled 2024-09-08: qty 60, 30d supply, fill #0

## 2024-09-08 MED ORDER — OSELTAMIVIR PHOSPHATE 75 MG PO CAPS
75.0000 mg | ORAL_CAPSULE | Freq: Two times a day (BID) | ORAL | 0 refills | Status: AC
  Filled 2024-09-08: qty 4, 2d supply, fill #0

## 2024-09-08 NOTE — Discharge Summary (Signed)
 " Physician Discharge Summary   Patient: Justin [name] MRN: [id number] DOB: 1981/01/08  Admit date:     09/05/2024  Discharge date: 09/08/2024  Discharge Physician: Amaryllis Dare   PCP: Gasper Nancyann BRAVO, MD   Recommendations at discharge:  Please obtain CBC, BMP and CK levels on follow-up Follow-up in a specialized pain clinic who deals with spinal cord injuries related lower extremity spasticity and spasms. Follow-up with primary care provider Patient did show some sign of sleep apnea, will get benefit from outpatient sleep study which can be arranged by PCP or his neurologist.  Discharge Diagnoses: Principal Problem:   Influenzal acute upper respiratory infection Active Problems:   Muscle spasms of both lower extremities   Chronic pain syndrome   Rhabdomyolysis   Chronic anxiety   Chronic depression   Dehydration   Influenza A   Influenza A with pneumonia   Upper respiratory tract infection   Hospital Course: Justin [name] is a 43 y.o. male with medical history significant of spinal cord injury, paraplegia, muscle spasticity, frequent urinary tract infections due to self-catheterization, wheelchair dependent, presented to ED with worsening lower extremity pain and spasms.   Patient was seen in ED few days ago for upper respiratory symptoms with positive sick contacts.  He was diagnosed with influenza A and was started on Tamiflu .   Now came with worsening lower extremity pain and spasms, he was concerned that he has developed rhabdomyolysis in the past with excessive lower extremity spasms.  Upper respiratory symptoms improving.  No fever or chills.  On presentation basic lab and vital stable, CK elevated at 918, UA was not consistent with UTI.  Lower extremity venous Doppler negative for DVT.  Chest x-ray was negative for any acute cardiopulmonary processes.   Patient was given IV fluid and was admitted for concern of excessive lower extremity spasms  causing rhabdomyolysis.  12/21: Vital stable, CK increased to 14,836.  Platelets at 110.  Increasing LR rate to 150 mL/h. Continue to have significant lower extremity muscle spasms-increasing baclofen  dose to 40 mg 3 times daily.  12/22: CODE BLUE was called yesterday evening.  Patient seems to be not breathing, patient recovered quickly without any CPR, went back on room air and talking, maybe some element of sleep apnea as he was sleeping.  Labs and chest x-ray stable.  CK started improving now at 6335, mild hypophosphatemia at 2.3.  Replating phosphorous and continuing IV fluid for another day. Started on Levaquin  due to worsening cough and congestion, patient is very concerned about developing pneumonia due to his prior history.  12/23: Remained hemodynamically stable, labs with mild hypokalemia at 3.3 and CK continue to improve now at 2437, renal functions normal.  Potassium and magnesium  was repleted before discharge.  Patient shows mild hypoxia and signs of intermittent apnea during sleep, he will get benefit from a outpatient sleep study for further evaluation.  Patient was given 3 more days of Levaquin  to complete a 5-day course for concern of developing pneumonia with influenza A.  Antibiotic choice was based on his allergies.  He can continue using Mucinex  DM as needed.  Patient is on multiple pain medications and muscle relaxants at home which he will continue and advised to follow-up in a specialized pain clinic for spinal cord injuries and spasticity and will get benefit from other modalities instead of more opioids.  He was also advised to keep himself well-hydrated as CK is still elevated than baseline but much improved than before.  His muscle spasm seems to be at baseline now.  He will continue with his home medications and follow-up with his providers closely for further management.  Assessment and Plan: * Influenzal acute upper respiratory infection With positive sick  contacts.  Respiratory panel positive for influenza A, chest x-ray with no infiltrate. -Started on Tamiflu -continue -Started on Levaquin  due to worsening cough and congestion and his history of recurrent pneumonia. -Supportive care - Added Mucinex  DM and DuoNeb nebulizer -Incentive spirometry  Chronic pain syndrome Patient continued to have significant lower extremity pain and spasms.  History of chronic pain syndrome since his spinal cord injury.  On multiple pain medications at home.  Pain more concerning for neuropathic pain with the shooting character. Magnesium  normal at 1.9, potassium at 3.3 which was repleted before discharge.  Acute exacerbation likely secondary to influenza. -Continuing home Lyrica , dantrolene , tizanidine , methadone , Dilaudid  and baclofen . - He will continue home medications on discharge.  Rhabdomyolysis  CK levels now at 14,866>>6335>>2437, history of prior rhabdomyolysis with excessive lower extremity muscle spasms. Received significant amount of IV fluid while in the hospital and discharged on instructions to keep himself well-hydrated. -Monitor CK  Chronic anxiety - Continuing home Xanax  as needed   Pain control - [place]  Controlled Substance Reporting System database was reviewed. and patient was instructed, not to drive, operate heavy machinery, perform activities at heights, swimming or participation in water activities or provide baby-sitting services while on Pain, Sleep and Anxiety Medications; until their outpatient Physician has advised to do so again. Also recommended to not to take more than prescribed Pain, Sleep and Anxiety Medications.  Consultants: None Procedures performed: None Disposition: Home Diet recommendation:  Regular diet DISCHARGE MEDICATION: Allergies as of 09/08/2024       Reactions   Azithromycin Anaphylaxis, Itching, Swelling   throat closes   Lorazepam Anaphylaxis, Itching   extremely hostile Other Reaction(s):  Other extremely hostile    Severe Aggression   Meperidine Anaphylaxis   Other Reaction(s): Unknown   Morphine  Anaphylaxis   psychosis Other Reaction(s): Hallucinations psychosis    Psychotic symptoms   Doxycycline     Blister on hand   Morphine  Sulfate    Psychotic symptoms        Medication List     STOP taking these medications    bacitracin  ointment   naproxen  sodium 550 MG tablet Commonly known as: ANAPROX        TAKE these medications    albuterol  108 (90 Base) MCG/ACT inhaler Commonly known as: VENTOLIN  HFA Inhale 2 puffs into the lungs every 6 (six) hours as needed for wheezing or shortness of breath.   alprazolam  2 MG tablet Commonly known as: XANAX  Take 0.5 tablets (1 mg total) by mouth every 6 (six) hours as needed for sleep.   amphetamine -dextroamphetamine  15 MG tablet Commonly known as: ADDERALL Take 1 tablet by mouth 2 (two) times daily.   baclofen  20 MG tablet Commonly known as: LIORESAL  TAKE ONE TABLET FOUR TIMES DAILY   cephALEXin  250 MG capsule Commonly known as: KEFLEX  Take 250 mg by mouth every morning.   dantrolene  100 MG capsule Commonly known as: DANTRIUM  TAKE 1 CAPSULE BY MOUTH 3 TIMES DAILY   DentaGel 1.1 % Gel dental gel Generic drug: sodium fluoride as needed.   dextromethorphan -guaiFENesin  30-600 MG 12hr tablet Commonly known as: MUCINEX  DM Take 1 tablet by mouth 2 (two) times daily as needed for cough.   docusate sodium  250 MG capsule Commonly known as: COLACE Take 1 capsule (250  mg total) by mouth 4 (four) times daily as needed.   fesoterodine  8 MG Tb24 tablet Commonly known as: TOVIAZ  TAKE 1 TABLET BY MOUTH DAILY   flavoxATE 100 MG tablet Commonly known as: URISPAS  Take 1 tablet (100 mg total) by mouth 3 (three) times daily as needed for bladder spasms.   fluticasone  50 MCG/ACT nasal spray Commonly known as: FLONASE  Place 2 sprays into both nostrils daily.   HYDROmorphone  4 MG tablet Commonly known as:  DILAUDID  TAKE 1 TABLET BY MOUTH EVERY 4-5 HOURS AS NEEDED FOR PAIN. NO MORE THAN 5 TABLETS IN A DAY   leptospermum manuka honey Pste paste Apply 1 Application topically daily.   levofloxacin  500 MG tablet Commonly known as: LEVAQUIN  Take 1 tablet (500 mg total) by mouth daily for 3 days. Start taking on: September 09, 2024   levothyroxine  88 MCG tablet Commonly known as: SYNTHROID  TAKE 1 TABLET BY MOUTH DAILY   Linzess  145 MCG Caps capsule Generic drug: linaclotide  Take 145 mcg by mouth daily before breakfast.   lubiprostone  24 MCG capsule Commonly known as: AMITIZA  TAKE 1 CAPSULE BY MOUTH 2 TIMES DAILY WITH A MEAL. FOR OPIOID AND SPINAL CORD INJURY BASED CONSTIPATION.   meclizine  25 MG tablet Commonly known as: ANTIVERT  Take by mouth.   methadone  10 MG tablet Commonly known as: DOLOPHINE  Take 2 tablets (20 mg total) by mouth every 6 (six) hours.   Mineral Oil Oil daily.   montelukast  10 MG tablet Commonly known as: SINGULAIR  Take 1 tablet (10 mg total) by mouth daily.   Movantik 12.5 MG Tabs tablet Generic drug: naloxegol oxalate Take 25 mg by mouth daily.   mupirocin  ointment 2 % Commonly known as: BACTROBAN  Apply 1 Application topically 2 (two) times daily.   naproxen  500 MG tablet Commonly known as: NAPROSYN  Take 1 tablet (500 mg total) by mouth 2 (two) times daily with a meal.   omeprazole  40 MG capsule Commonly known as: PRILOSEC Take 40 mg by mouth daily. What changed: Another medication with the same name was removed. Continue taking this medication, and follow the directions you see here.   oseltamivir  75 MG capsule Commonly known as: TAMIFLU  Take 1 capsule (75 mg total) by mouth 2 (two) times daily for 2 days.   oxybutynin  5 MG tablet Commonly known as: DITROPAN  Take 5 mg by mouth daily as needed.   pregabalin  150 MG capsule Commonly known as: Lyrica  Take 1 capsule (150 mg total) by mouth 2-3 times daily as needed. What changed:  how much to  take how to take this when to take this reasons to take this additional instructions   promethazine  25 MG tablet Commonly known as: PHENERGAN  TAKE ONE TABLET EVERY FOUR HOURS IF NEEDED FOR NAUSEA OR VOMITING   promethazine  25 MG suppository Commonly known as: PHENERGAN  INSERT TWO (2) SUPPOSITORIES RECTALLY EVERY 6 HOURS AS NEEDED FOR NAUSEA OR VOMITING   pyridostigmine 60 MG tablet Commonly known as: MESTINON Take 60 mg by mouth once.  Take 1 tablet (60 mg total) by mouth Three (3) times a day. Start with 1/2 tablet in the morning and if tolerated well, can increase up to 3 times a day   sildenafil  20 MG tablet Commonly known as: REVATIO  TAKE 3 TO 5 TABLETS 2 HOURS BEFORE INTERCOURSE ON EMPTY STOMACH. DO NOT TAKE WITH NITRATES.   silver  sulfADIAZINE  1 % cream Commonly known as: SSD APPLY 2 GRAMS TO AFFECTED AREA TWICE DAILY APPLY TO AFFECTED AREAS TOPICALLY TWICE DAILY  tiZANidine  4 MG tablet Commonly known as: ZANAFLEX  TAKE 2 TABLETS BY MOUTH 4 TIMES DAILY        Follow-up Information     Gasper Nancyann BRAVO, MD. Schedule an appointment as soon as possible for a visit in 1 week(s).   Specialty: Family Medicine Contact information: [address] [place] [address] [phone]                Discharge Exam: Fredricka Weights   09/05/24 0826  Weight: 77.1 kg   General.  Well-developed gentleman, in no acute distress. Pulmonary.  Lungs clear bilaterally, normal respiratory effort. CV.  Regular rate and rhythm, no JVD, rub or murmur. Abdomen.  Soft, nontender, nondistended, BS positive. CNS.  Alert and oriented .  Paraplegic Extremities.  No edema, pulses intact and symmetrical. Psychiatry.  Judgment and insight appears normal.   Condition at discharge: stable  The results of significant diagnostics from this hospitalization (including imaging, microbiology, ancillary and laboratory) are listed below for reference.   Imaging Studies: DG  Chest Port 1 View Result Date: 09/06/2024 EXAM: 1 VIEW(S) XRAY OF THE CHEST 09/06/2024 07:21:00 PM COMPARISON: 09/05/2024 CLINICAL HISTORY: Hypoxic respiratory failure (HCC) FINDINGS: LINES, TUBES AND DEVICES: Cardiac paddles in place. LUNGS AND PLEURA: No focal pulmonary opacity. No pleural effusion. No pneumothorax. HEART AND MEDIASTINUM: No acute abnormality of the cardiac and mediastinal silhouettes. BONES AND SOFT TISSUES: Thoracolumbar fusion hardware noted. No acute osseous abnormality. IMPRESSION: 1. No acute cardiopulmonary process. Electronically signed by: Greig Pique MD 09/06/2024 07:27 PM EST RP Workstation: [id number]   US  Venous Img Lower Bilateral Result Date: 09/05/2024 EXAM: ULTRASOUND DUPLEX OF THE BILATERAL LOWER EXTREMITY VEINS TECHNIQUE: Duplex ultrasound using B-mode/gray scaled imaging and Doppler spectral analysis and color flow was obtained of the deep venous structures of the bilateral lower extremities. COMPARISON: US  Extremity Low Venous Left 11/19/2016. CLINICAL HISTORY: Bilateral lower extremity pain. FINDINGS: There is no definite evidence of deep venous thrombosis, but the infrapopliteal veins are suboptimally demonstrated bilaterally. LEFT: The common femoral vein, femoral vein, and popliteal vein demonstrate normal compressibility with normal color flow and spectral analysis. The infrapopliteal veins are suboptimally demonstrated. RIGHT: The common femoral vein, femoral vein, and popliteal vein demonstrate normal compressibility with normal color flow and spectral analysis. The infrapopliteal veins are suboptimally demonstrated. IMPRESSION: 1. No definite evidence of deep venous thrombosis. 2. Infrapopliteal veins are suboptimally demonstrated bilaterally, limiting evaluation. Electronically signed by: Evalene Coho MD 09/05/2024 07:02 AM EST RP Workstation: [id number]   DG Chest Port 1 View Result Date: 09/05/2024 EXAM: 1 VIEW(S) XRAY OF THE CHEST 09/05/2024 04:18:00  AM COMPARISON: 09/02/2024 CLINICAL HISTORY: Aspiration? Episode a couple days ago FINDINGS: LUNGS AND PLEURA: No focal pulmonary opacity. No pleural effusion. No pneumothorax. HEART AND MEDIASTINUM: No acute abnormality of the cardiac and mediastinal silhouettes. BONES AND SOFT TISSUES: Thoracic spine fixation hardware present. IMPRESSION: 1. No acute cardiopulmonary process. No focal consolidation to suggest aspiration. Electronically signed by: Evalene Coho MD 09/05/2024 04:32 AM EST RP Workstation: [id number]   DG Chest 2 View Result Date: 09/02/2024 CLINICAL DATA:  Shortness of breath EXAM: CHEST - 2 VIEW COMPARISON:  December 30, 2022 FINDINGS: The heart size and mediastinal contours are within normal limits. Both lungs are clear. Stable postsurgical changes seen involving midthoracic spine. IMPRESSION: No active cardiopulmonary disease. Electronically Signed   By: Lynwood Landy Raddle M.D.   On: 09/02/2024 08:25    Microbiology: Results for orders placed or performed during the hospital encounter of  09/05/24  Resp panel by RT-PCR (RSV, Flu A&B, Covid) Anterior Nasal Swab     Status: Abnormal   Collection Time: 09/05/24  4:26 AM   Specimen: Anterior Nasal Swab  Result Value Ref Range Status   SARS Coronavirus 2 by RT PCR NEGATIVE NEGATIVE Final    Comment: (NOTE) SARS-CoV-2 target nucleic acids are NOT DETECTED.  The SARS-CoV-2 RNA is generally detectable in upper respiratory specimens during the acute phase of infection. The lowest concentration of SARS-CoV-2 viral copies this assay can detect is 138 copies/mL. A negative result does not preclude SARS-Cov-2 infection and should not be used as the sole basis for treatment or other patient management decisions. A negative result may occur with  improper specimen collection/handling, submission of specimen other than nasopharyngeal swab, presence of viral mutation(s) within the areas targeted by this assay, and inadequate number of  viral copies(<138 copies/mL). A negative result must be combined with clinical observations, patient history, and epidemiological information. The expected result is Negative.  Fact Sheet for Patients:  bloggercourse.com  Fact Sheet for Healthcare Providers:  seriousbroker.it  This test is no t yet approved or cleared by the [place]  FDA and  has been authorized for detection and/or diagnosis of SARS-CoV-2 by FDA under an Emergency Use Authorization (EUA). This EUA will remain  in effect (meaning this test can be used) for the duration of the COVID-19 declaration under Section 564(b)(1) of the Act, 21 U.S.C.section 360bbb-3(b)(1), unless the authorization is terminated  or revoked sooner.       Influenza A by PCR POSITIVE (A) NEGATIVE Final   Influenza B by PCR NEGATIVE NEGATIVE Final    Comment: (NOTE) The Xpert Xpress SARS-CoV-2/FLU/RSV plus assay is intended as an aid in the diagnosis of influenza from Nasopharyngeal swab specimens and should not be used as a sole basis for treatment. Nasal washings and aspirates are unacceptable for Xpert Xpress SARS-CoV-2/FLU/RSV testing.  Fact Sheet for Patients: bloggercourse.com  Fact Sheet for Healthcare Providers: seriousbroker.it  This test is not yet approved or cleared by the [place]  FDA and has been authorized for detection and/or diagnosis of SARS-CoV-2 by FDA under an Emergency Use Authorization (EUA). This EUA will remain in effect (meaning this test can be used) for the duration of the COVID-19 declaration under Section 564(b)(1) of the Act, 21 U.S.C. section 360bbb-3(b)(1), unless the authorization is terminated or revoked.     Resp Syncytial Virus by PCR NEGATIVE NEGATIVE Final    Comment: (NOTE) Fact Sheet for Patients: bloggercourse.com  Fact Sheet for Healthcare  Providers: seriousbroker.it  This test is not yet approved or cleared by the [place]  FDA and has been authorized for detection and/or diagnosis of SARS-CoV-2 by FDA under an Emergency Use Authorization (EUA). This EUA will remain in effect (meaning this test can be used) for the duration of the COVID-19 declaration under Section 564(b)(1) of the Act, 21 U.S.C. section 360bbb-3(b)(1), unless the authorization is terminated or revoked.  Performed at [facility], [address] [place], [place] [address]     Labs: CBC: Recent Labs  Lab 09/02/24 [phone] 09/04/24 2309 09/05/24 1843 09/06/24 0717 09/07/24 0554  WBC 5.7 5.9 5.4 4.3 2.4*  HGB 14.5 13.7 14.7 14.1 12.2*  HCT 43.9 41.5 44.8 42.9 36.8*  MCV 86.2 87.0 86.7 86.5 85.8  PLT 148* 132* 129* 110* 87*   Basic Metabolic Panel: Recent Labs  Lab 09/02/24 0808 09/04/24 2309 09/05/24 1843 09/06/24 0717 09/07/24 0554 09/08/24 0508  NA 139  138  --  138 137 141  K 4.1 4.3  --  4.1 4.1 3.3*  CL 102 99  --  98 97* 100  CO2 28 23  --  23 27 30   GLUCOSE 138* 75  --  59* 60* 83  BUN 14 11  --  17 11 5*  CREATININE 0.75 0.82 0.90 0.75 0.67 0.54*  CALCIUM 9.5 9.3  --  8.7* 8.3* 8.4*  MG  --  1.9 2.0  --   --   --   PHOS  --   --  3.6  --  2.3* 3.1   Liver Function Tests: Recent Labs  Lab 09/04/24 2309 09/07/24 0554 09/08/24 0508  AST 45*  --   --   ALT 34  --   --   ALKPHOS 62  --   --   BILITOT 0.4  --   --   PROT 7.0  --   --   ALBUMIN 4.5 3.8 3.9   CBG: Recent Labs  Lab 09/06/24 0853 09/06/24 0921 09/06/24 1129 09/06/24 1857  GLUCAP 59* 100* 98 99    Discharge time spent: greater than 30 minutes.  This record has been created using Conservation officer, historic buildings. Errors have been sought and corrected,but may not always be located. Such creation errors do not reflect on the standard of care.   Signed: Amaryllis Dare, MD [facility] 09/08/2024 "

## 2024-09-08 NOTE — Progress Notes (Signed)
 Pt is alert and fully oriented x 4, afebrile, stable hemodynamically. At night time when he is sleeping, the lowest SPO2 is 81-84% on room air. O2 NCL is given at 3 LPM to help maintaining SPO2 above 92-95%. He has heavily snoring after getting all of his night time regimens. Pt is likely having sleep apnea. We will hand off to the day shift team for future evaluation.   Plan of care is reviewed. He has been progressing. Legs pain from muscle spasm is well tolerated. He is able to rest well. We will continue to monitor.  Wendi Dash, RN

## 2024-09-08 NOTE — TOC CM/SW Note (Signed)
 Transition of Care [facility]) - Inpatient Brief Assessment   Patient Details  Name: Justin [name] MRN: [id number] Date of Birth: 1981/03/18  Transition of Care [facility]) CM/SW Contact:    Corean ONEIDA Haddock, RN Phone Number: 09/08/2024, 11:19 AM   Clinical Narrative: Transition of Care Department [facility]) has reviewed patient and no TOC needs have been identified at this time.  If new patient transition needs arise, please place a TOC consult.   Patient states that he lives at home with his wife Patient has a manuel WC and roho at home Patient states at baseline he drives, wife to transport at discharge Noted patient desated last night.  Per MD no nocturnal O2 require and she advised patient he would need and outpatient sleep study   Transition of Care Asessment: Insurance and Status: Insurance coverage has been reviewed Patient has primary care physician: Yes     Prior/Current Home Services: No current home services Social Drivers of Health Review: SDOH reviewed no interventions necessary Readmission risk has been reviewed: Yes Transition of care needs: no transition of care needs at this time

## 2024-09-08 NOTE — Discharge Instructions (Signed)
 Please try getting a sleep study as you intermittently show signs of apnea and decrease your oxygen while sleeping.

## 2024-09-08 NOTE — Plan of Care (Signed)

## 2024-09-09 ENCOUNTER — Telehealth: Payer: Self-pay

## 2024-09-09 ENCOUNTER — Other Ambulatory Visit: Payer: Self-pay

## 2024-09-09 NOTE — Transitions of Care (Post Inpatient/ED Visit) (Signed)
" ° °  09/09/2024  Name: Justin [name] MRN: [id number] DOB: 16-Feb-1981  Today's TOC FU Call Status: Today's TOC FU Call Status:: Successful TOC FU Call Completed TOC FU Call Complete Date: 09/09/24  Patient's Name and Date of Birth confirmed. Name, DOB  Transition Care Management Follow-up Telephone Call Date of Discharge: 09/08/24 Discharge Facility: [facility]) Type of Discharge: Inpatient Admission Primary Inpatient Discharge Diagnosis:: influenza acute upper respiratory infection How have you been since you were released from the hospital?: Same Any questions or concerns?: Yes  Patient states he is unable to complete the initial TOC assessment. Agreeable to call back from RN case manager to complete.   Arvin Seip RN, BSN, CCM [employer], Population Health Case Manager Phone: [phone]  "

## 2024-09-16 ENCOUNTER — Inpatient Hospital Stay: Admitting: Family Medicine

## 2024-09-19 ENCOUNTER — Other Ambulatory Visit: Payer: Self-pay | Admitting: Physical Medicine and Rehabilitation

## 2024-09-19 ENCOUNTER — Other Ambulatory Visit: Payer: Self-pay | Admitting: Family Medicine

## 2024-09-19 ENCOUNTER — Encounter: Payer: Self-pay | Admitting: Family Medicine

## 2024-09-19 DIAGNOSIS — F1190 Opioid use, unspecified, uncomplicated: Secondary | ICD-10-CM

## 2024-09-19 DIAGNOSIS — G8220 Paraplegia, unspecified: Secondary | ICD-10-CM

## 2024-09-19 DIAGNOSIS — G894 Chronic pain syndrome: Secondary | ICD-10-CM

## 2024-09-23 ENCOUNTER — Other Ambulatory Visit: Payer: Self-pay | Admitting: Physical Medicine and Rehabilitation

## 2024-09-23 ENCOUNTER — Telehealth: Payer: Self-pay

## 2024-09-23 ENCOUNTER — Other Ambulatory Visit (HOSPITAL_COMMUNITY): Payer: Self-pay

## 2024-09-23 NOTE — Telephone Encounter (Signed)
 Copied from CRM #[id number]. Topic: Clinical - Medication Prior Auth >> Sep 23, 2024 11:32 AM [name] wrote: Reason for CRM: Patient states every year insurance requires new prior authorization for methadone  (DOLOPHINE ) 10 MG tablet. Please submit and advise. # [phone]

## 2024-09-23 NOTE — Telephone Encounter (Signed)
 Pharmacy Patient Advocate Encounter   Received notification from Physician's Office that prior authorization for Methadone  HCl 10MG  tablets is required/requested.   Insurance verification completed.   The patient is insured through [facility].   Per test claim: PA required; PA submitted to above mentioned insurance via Latent Key/confirmation #/EOC [facility] Status is pending

## 2024-09-24 ENCOUNTER — Other Ambulatory Visit: Payer: Self-pay | Admitting: Physical Medicine and Rehabilitation

## 2024-09-28 ENCOUNTER — Encounter: Payer: Self-pay | Admitting: Family Medicine

## 2024-09-28 ENCOUNTER — Other Ambulatory Visit (HOSPITAL_COMMUNITY): Payer: Self-pay

## 2024-09-28 ENCOUNTER — Ambulatory Visit: Admitting: Family Medicine

## 2024-09-28 VITALS — BP 108/48 | HR 90 | Resp 16 | Ht 73.0 in | Wt 180.0 lb

## 2024-09-28 DIAGNOSIS — M6282 Rhabdomyolysis: Secondary | ICD-10-CM

## 2024-09-28 DIAGNOSIS — G4710 Hypersomnia, unspecified: Secondary | ICD-10-CM

## 2024-09-28 DIAGNOSIS — R0681 Apnea, not elsewhere classified: Secondary | ICD-10-CM

## 2024-09-28 DIAGNOSIS — Z1159 Encounter for screening for other viral diseases: Secondary | ICD-10-CM

## 2024-09-28 DIAGNOSIS — J111 Influenza due to unidentified influenza virus with other respiratory manifestations: Secondary | ICD-10-CM | POA: Diagnosis not present

## 2024-09-28 DIAGNOSIS — G8220 Paraplegia, unspecified: Secondary | ICD-10-CM | POA: Diagnosis not present

## 2024-09-28 DIAGNOSIS — L89312 Pressure ulcer of right buttock, stage 2: Secondary | ICD-10-CM | POA: Diagnosis not present

## 2024-09-28 DIAGNOSIS — R300 Dysuria: Secondary | ICD-10-CM

## 2024-09-28 DIAGNOSIS — Z23 Encounter for immunization: Secondary | ICD-10-CM

## 2024-09-28 DIAGNOSIS — E039 Hypothyroidism, unspecified: Secondary | ICD-10-CM | POA: Diagnosis not present

## 2024-09-28 DIAGNOSIS — F1190 Opioid use, unspecified, uncomplicated: Secondary | ICD-10-CM | POA: Diagnosis not present

## 2024-09-28 DIAGNOSIS — G894 Chronic pain syndrome: Secondary | ICD-10-CM

## 2024-09-28 LAB — POCT URINALYSIS DIPSTICK
Bilirubin, UA: NEGATIVE
Blood, UA: NEGATIVE
Clarity, UA: NEGATIVE
Color, UA: NEGATIVE
Glucose, UA: NEGATIVE
Ketones, UA: NEGATIVE
Leukocytes, UA: NEGATIVE
Nitrite, UA: NEGATIVE
Protein, UA: NEGATIVE
Spec Grav, UA: 1.015
Urobilinogen, UA: 0.2 U/dL
pH, UA: 5.5

## 2024-09-28 NOTE — Telephone Encounter (Signed)
 Pharmacy Patient Advocate Encounter  Received notification from [employer] that Prior Authorization for METHADONE  has been APPROVED to 09/16/25. Ran test claim, Copay is $3.68. This test claim was processed through [facility]- copay amounts may vary at other pharmacies due to pharmacy/plan contracts, or as the patient moves through the different stages of their insurance plan.

## 2024-09-29 ENCOUNTER — Other Ambulatory Visit (HOSPITAL_COMMUNITY): Payer: Self-pay

## 2024-09-29 LAB — CBC WITH DIFFERENTIAL/PLATELET
Basophils Absolute: 0 x10E3/uL (ref 0.0–0.2)
Basos: 0 %
EOS (ABSOLUTE): 0 x10E3/uL (ref 0.0–0.4)
Eos: 1 %
Hematocrit: 43 % (ref 37.5–51.0)
Hemoglobin: 14.2 g/dL (ref 13.0–17.7)
Immature Grans (Abs): 0 x10E3/uL (ref 0.0–0.1)
Immature Granulocytes: 0 %
Lymphocytes Absolute: 1 x10E3/uL (ref 0.7–3.1)
Lymphs: 18 %
MCH: 28.6 pg (ref 26.6–33.0)
MCHC: 33 g/dL (ref 31.5–35.7)
MCV: 87 fL (ref 79–97)
Monocytes Absolute: 0.4 x10E3/uL (ref 0.1–0.9)
Monocytes: 7 %
Neutrophils Absolute: 3.8 x10E3/uL (ref 1.4–7.0)
Neutrophils: 74 %
Platelets: 161 x10E3/uL (ref 150–450)
RBC: 4.97 x10E6/uL (ref 4.14–5.80)
RDW: 13.3 % (ref 11.6–15.4)
WBC: 5.2 x10E3/uL (ref 3.4–10.8)

## 2024-09-29 LAB — COMPREHENSIVE METABOLIC PANEL WITH GFR
ALT: 26 IU/L (ref 0–44)
AST: 22 IU/L (ref 0–40)
Albumin: 4.4 g/dL (ref 4.1–5.1)
Alkaline Phosphatase: 94 IU/L (ref 47–123)
BUN/Creatinine Ratio: 20 (ref 9–20)
BUN: 17 mg/dL (ref 6–24)
Bilirubin Total: 0.3 mg/dL (ref 0.0–1.2)
CO2: 24 mmol/L (ref 20–29)
Calcium: 9.3 mg/dL (ref 8.7–10.2)
Chloride: 104 mmol/L (ref 96–106)
Creatinine, Ser: 0.85 mg/dL (ref 0.76–1.27)
Globulin, Total: 2.2 g/dL (ref 1.5–4.5)
Glucose: 82 mg/dL (ref 70–99)
Potassium: 4.4 mmol/L (ref 3.5–5.2)
Sodium: 143 mmol/L (ref 134–144)
Total Protein: 6.6 g/dL (ref 6.0–8.5)
eGFR: 111 mL/min/1.73

## 2024-09-29 LAB — TSH+FREE T4
Free T4: 1.91 ng/dL — ABNORMAL HIGH (ref 0.82–1.77)
TSH: 2.23 u[IU]/mL (ref 0.450–4.500)

## 2024-09-29 LAB — CK: Total CK: 66 U/L (ref 49–439)

## 2024-09-30 LAB — URINE CULTURE: Organism ID, Bacteria: NO GROWTH

## 2024-10-01 ENCOUNTER — Other Ambulatory Visit: Payer: Self-pay | Admitting: Physical Medicine and Rehabilitation

## 2024-10-01 ENCOUNTER — Other Ambulatory Visit (HOSPITAL_COMMUNITY): Payer: Self-pay

## 2024-10-01 NOTE — Telephone Encounter (Signed)
 Pharmacy Patient Advocate Encounter  Received notification from [facility] that Prior Authorization for Methadone  HCl 10MG  tablets has been APPROVED from 09/17/2024 to 09/16/2025. Ran test claim, Copay is $3.68. This test claim was processed through [facility]- copay amounts may vary at other pharmacies due to pharmacy/plan contracts, or as the patient moves through the different stages of their insurance plan.   PA #/Case ID/Reference #: [id number]

## 2024-10-02 ENCOUNTER — Ambulatory Visit: Payer: Self-pay | Admitting: Family Medicine

## 2024-10-05 ENCOUNTER — Encounter: Payer: Self-pay | Admitting: Family Medicine

## 2024-10-05 NOTE — Patient Instructions (Signed)
 SABRA  Please review the attached list of medications and notify my office if there are any errors.   . Please bring all of your medications to every appointment so we can make sure that our medication list is the same as yours.

## 2024-10-05 NOTE — Progress Notes (Signed)
 "     Established patient visit   Patient: Justin [name]   DOB: 1980/12/16   44 y.o. Male  MRN: [id number] Visit Date: 09/28/2024  Today's healthcare provider: Nancyann Perry, MD   Chief Complaint  Patient presents with   Hospitalization Follow-up    Hosp f/u on URI . Pt wants a referral to wound clinic due to pressure sore on his bottom. Also pt wants UA for possible bladder infections.   Subjective    Discussed the use of AI scribe software for clinical note transcription with the patient, who gave verbal consent to proceed.  History of Present Illness   Justin [name] is a 44 year old male with spasticity who presents with a new pressure sore and recent hospitalization due to flu exacerbating his condition.  He was recently hospitalized due to the flu, which significantly exacerbated his spasticity, causing severe pain described as intense enough to make him scream. During this period, he also experienced dehydration, which has since resolved. Initially, he was concerned about not being able to expel mucus but now feels that the symptoms have cleared.  He is concerned about a developing pressure sore on his right side, which is progressively worsening. The skin is red, irritated, and beginning to break down, with some red, dried blood observed in his brief daily. The sore is near an old pressure sore site, but not exactly the same location.  He underwent a urodynamics test recently and typically develops a bladder infection following this procedure. He has provided a urine specimen for analysis. His CK levels were high during his hospital stay, likely due to his legs' frequent spasms, which he humorously compares to running marathons.  He discusses a history of sleep apnea, which was noted during his hospital stay. He stops breathing during sleep and has severe snoring, a trait he shares with his parents. His father uses a CPAP machine.  Regarding his  medication, he is currently taking hydromorphone  and methadone . He recently picked up a refill for hydromorphone  and mentions that methadone  requires prior approval, which he is in the process of obtaining. He takes methadone  four times a day instead of the usual three.       Medications: Show/hide medication list[1]     Objective    BP (!) 108/48 (BP Location: Right Arm, Patient Position: Sitting, Cuff Size: Normal)   Pulse 90   Resp 16   Ht 6' 1 (1.854 m)   Wt 180 lb (81.6 kg)   SpO2 96%   BMI 23.75 kg/m   Physical Exam   General: Appearance:    Well developed, well nourished male in no acute distress  Eyes:    PERRL, conjunctiva/corneas clear, EOM's intact       Lungs:     Clear to auscultation bilaterally, respirations unlabored  Heart:    Normal heart rate. Normal Justin. No murmurs, rubs, or gallops.    MS:   All extremities are intact.    Neurologic:   Awake, alert, oriented x 3. No apparent focal neurological defect.           Assessment & Plan       Pressure injury of right buttock, stage 2 Stage 2 pressure injury with redness, irritation, and skin breakdown. Possible open wound indicated by red, dried blood in brief. - Referred to wound care clinic for evaluation and management.  Paraplegia Chronic condition with spasticity exacerbated by recent flu infection, causing severe spasms and pain. -  Consider using weighted blankets or leg weights to manage spasms.  Chronic pain syndrome Chronic pain managed with hydromorphone  and methadone . He prefers current management over pain clinic practices. - Continue current pain management regimen with hydromorphone  and methadone .  Chronic opioid therapy Managed by a certified doctor. Reports challenges with methadone  prior approval and dosing frequency.  Rhabdomyolysis Elevated CK levels likely due to severe muscle spasms. - Ordered CK level recheck to monitor muscle enzyme levels.  Sleep apnea (suspected) -  Hypersomnia with witnessed episodes of sleep apnea Suspected sleep apnea based on apneic episodes during sleep and family history. - Ordered home sleep study to evaluate for sleep apnea. - Discussed potential treatment options including CPAP and surgical implant.     Upper respiratory tract infection due to influenza (Primary) Symptoms mostly resolved.  - CBC with Differential/Platelet - Comprehensive metabolic panel with GFR  Hypothyroidism, unspecified type - TSH + free T4  Dysuria - history of multidrug resistance recurrent UTI - Urine Culture  Addressed extensive list of chronic and acute medical problems today requiring 40 minutes reviewing his medical record, counseling patient regarding his conditions and coordination of care.        Nancyann Perry, MD  [facility] [phone] (phone) [phone] (fax)  [facility]     [1]  Outpatient Medications Prior to Visit  Medication Sig   albuterol  (VENTOLIN  HFA) 108 (90 Base) MCG/ACT inhaler Inhale 2 puffs into the lungs every 6 (six) hours as needed for wheezing or shortness of breath.   alprazolam  (XANAX ) 2 MG tablet Take 0.5 tablets (1 mg total) by mouth every 6 (six) hours as needed for sleep.   amphetamine -dextroamphetamine  (ADDERALL) 15 MG tablet Take 1 tablet by mouth 2 (two) times daily.   baclofen  (LIORESAL ) 20 MG tablet TAKE ONE TABLET FOUR TIMES DAILY   cephALEXin  (KEFLEX ) 250 MG capsule Take 250 mg by mouth every morning.   dantrolene  (DANTRIUM ) 100 MG capsule TAKE 1 CAPSULE BY MOUTH 3 TIMES DAILY   dextromethorphan -guaiFENesin  (MUCINEX  DM) 30-600 MG 12hr tablet Take 1 tablet by mouth 2 (two) times daily as needed for cough.   docusate sodium  (COLACE) 250 MG capsule Take 1 capsule (250 mg total) by mouth 4 (four) times daily as needed.   fesoterodine  (TOVIAZ ) 8 MG TB24 tablet TAKE 1 TABLET BY MOUTH DAILY   flavoxATE (URISPAS ) 100 MG tablet Take 1 tablet (100 mg total) by  mouth 3 (three) times daily as needed for bladder spasms.   fluticasone  (FLONASE ) 50 MCG/ACT nasal spray Place 2 sprays into both nostrils daily. (Patient taking differently: Place 2 sprays into both nostrils as needed.)   HYDROmorphone  (DILAUDID ) 4 MG tablet TAKE 1 TABLET BY MOUTH EVERY 4-5 HOURS AS NEEDED FOR PAIN. NO MORE THAN 5 TABLETS IN A DAY   leptospermum manuka honey (MEDIHONEY) PSTE paste Apply 1 Application topically daily.   levothyroxine  (SYNTHROID ) 88 MCG tablet TAKE 1 TABLET BY MOUTH DAILY   linaclotide  (LINZESS ) 145 MCG CAPS capsule Take 145 mcg by mouth daily before breakfast.   lubiprostone  (AMITIZA ) 24 MCG capsule TAKE 1 CAPSULE BY MOUTH 2 TIMES DAILY WITH A MEAL. FOR OPIOID AND SPINAL CORD INJURY BASED CONSTIPATION.   meclizine  (ANTIVERT ) 25 MG tablet Take by mouth.   methadone  (DOLOPHINE ) 10 MG tablet Take 2 tablets (20 mg total) by mouth every 6 (six) hours.   Mineral Oil OIL daily.   montelukast  (SINGULAIR ) 10 MG tablet Take 1 tablet (10 mg total) by mouth daily.  MOVANTIK 12.5 MG TABS tablet Take 25 mg by mouth daily.   mupirocin  ointment (BACTROBAN ) 2 % Apply 1 Application topically 2 (two) times daily.   naproxen  (NAPROSYN ) 500 MG tablet Take 1 tablet (500 mg total) by mouth 2 (two) times daily with a meal.   omeprazole  (PRILOSEC) 40 MG capsule Take 40 mg by mouth daily.   oxybutynin  (DITROPAN ) 5 MG tablet Take 5 mg by mouth daily as needed.   pregabalin  (LYRICA ) 150 MG capsule Take 1 capsule (150 mg total) by mouth 2-3 times daily as needed.   promethazine  (PHENERGAN ) 25 MG suppository INSERT TWO (2) SUPPOSITORIES RECTALLY EVERY 6 HOURS AS NEEDED FOR NAUSEA OR VOMITING   promethazine  (PHENERGAN ) 25 MG tablet TAKE ONE TABLET EVERY FOUR HOURS IF NEEDED FOR NAUSEA OR VOMITING   pyridostigmine (MESTINON) 60 MG tablet Take 60 mg by mouth once.  Take 1 tablet (60 mg total) by mouth Three (3) times a day. Start with 1/2 tablet in the morning and if tolerated well, can increase  up to 3 times a day   sildenafil  (REVATIO ) 20 MG tablet TAKE 3 TO 5 TABLETS 2 HOURS BEFORE INTERCOURSE ON EMPTY STOMACH. DO NOT TAKE WITH NITRATES.   silver  sulfADIAZINE  (SSD) 1 % cream APPLY 2 GRAMS TO AFFECTED AREA TWICE DAILY APPLY TO AFFECTED AREAS TOPICALLY TWICE DAILY   sodium fluoride (DENTAGEL) 1.1 % GEL dental gel as needed.   tiZANidine  (ZANAFLEX ) 4 MG tablet TAKE 2 TABLETS BY MOUTH 4 TIMES DAILY   No facility-administered medications prior to visit.   "

## 2024-10-15 ENCOUNTER — Other Ambulatory Visit: Payer: Self-pay | Admitting: Family Medicine

## 2024-10-15 DIAGNOSIS — G894 Chronic pain syndrome: Secondary | ICD-10-CM

## 2024-10-15 DIAGNOSIS — G8220 Paraplegia, unspecified: Secondary | ICD-10-CM

## 2024-10-15 DIAGNOSIS — F1190 Opioid use, unspecified, uncomplicated: Secondary | ICD-10-CM

## 2025-01-27 ENCOUNTER — Ambulatory Visit
